# Patient Record
Sex: Male | Born: 1940 | Race: White | Hispanic: No | Smoking: Former smoker
Health system: Southern US, Community
[De-identification: ages and names within clinical notes are randomized; demographics above are authoritative.]

## PROBLEM LIST (undated history)

## (undated) DIAGNOSIS — E785 Hyperlipidemia, unspecified: Secondary | ICD-10-CM

## (undated) DIAGNOSIS — Z87442 Personal history of urinary calculi: Secondary | ICD-10-CM

## (undated) DIAGNOSIS — I495 Sick sinus syndrome: Secondary | ICD-10-CM

## (undated) DIAGNOSIS — R011 Cardiac murmur, unspecified: Secondary | ICD-10-CM

## (undated) DIAGNOSIS — E119 Type 2 diabetes mellitus without complications: Secondary | ICD-10-CM

## (undated) DIAGNOSIS — I10 Essential (primary) hypertension: Secondary | ICD-10-CM

## (undated) DIAGNOSIS — Z9289 Personal history of other medical treatment: Secondary | ICD-10-CM

## (undated) DIAGNOSIS — Z950 Presence of cardiac pacemaker: Secondary | ICD-10-CM

## (undated) HISTORY — PX: LAPAROSCOPIC CHOLECYSTECTOMY: SUR755

## (undated) HISTORY — PX: LITHOTRIPSY: SUR834

## (undated) HISTORY — PX: KNEE ARTHROSCOPY W/ MENISCECTOMY: SHX1879

## (undated) HISTORY — DX: Hyperlipidemia, unspecified: E78.5

## (undated) HISTORY — PX: APPENDECTOMY: SHX54

## (undated) HISTORY — PX: TONSILLECTOMY: SUR1361

## (undated) HISTORY — PX: CYSTOSCOPY W/ STONE MANIPULATION: SHX1427

## (undated) HISTORY — PX: CATARACT EXTRACTION W/ INTRAOCULAR LENS IMPLANT: SHX1309

---

## 1998-03-20 ENCOUNTER — Emergency Department (HOSPITAL_COMMUNITY): Admission: EM | Admit: 1998-03-20 | Discharge: 1998-03-20 | Payer: Self-pay | Admitting: Emergency Medicine

## 1998-03-20 ENCOUNTER — Encounter: Payer: Self-pay | Admitting: Emergency Medicine

## 1998-03-21 ENCOUNTER — Ambulatory Visit (HOSPITAL_COMMUNITY): Admission: RE | Admit: 1998-03-21 | Discharge: 1998-03-21 | Payer: Self-pay | Admitting: Urology

## 1998-03-21 ENCOUNTER — Encounter: Payer: Self-pay | Admitting: Urology

## 1998-04-11 ENCOUNTER — Ambulatory Visit (HOSPITAL_COMMUNITY): Admission: RE | Admit: 1998-04-11 | Discharge: 1998-04-11 | Payer: Self-pay | Admitting: Urology

## 1998-04-11 ENCOUNTER — Encounter: Payer: Self-pay | Admitting: Urology

## 1998-06-25 ENCOUNTER — Ambulatory Visit (HOSPITAL_COMMUNITY): Admission: RE | Admit: 1998-06-25 | Discharge: 1998-06-25 | Payer: Self-pay | Admitting: Urology

## 1998-06-25 ENCOUNTER — Encounter: Payer: Self-pay | Admitting: Urology

## 1998-06-27 ENCOUNTER — Ambulatory Visit (HOSPITAL_COMMUNITY): Admission: RE | Admit: 1998-06-27 | Discharge: 1998-06-27 | Payer: Self-pay | Admitting: Urology

## 1998-06-27 ENCOUNTER — Encounter: Payer: Self-pay | Admitting: Urology

## 1998-07-07 ENCOUNTER — Encounter: Payer: Self-pay | Admitting: Emergency Medicine

## 1998-07-07 ENCOUNTER — Encounter: Payer: Self-pay | Admitting: Urology

## 1998-07-08 ENCOUNTER — Inpatient Hospital Stay (HOSPITAL_COMMUNITY): Admission: EM | Admit: 1998-07-08 | Discharge: 1998-07-09 | Payer: Self-pay | Admitting: Emergency Medicine

## 2002-06-26 ENCOUNTER — Ambulatory Visit (HOSPITAL_COMMUNITY): Admission: RE | Admit: 2002-06-26 | Discharge: 2002-06-26 | Payer: Self-pay | Admitting: Gastroenterology

## 2004-03-28 ENCOUNTER — Encounter: Admission: RE | Admit: 2004-03-28 | Discharge: 2004-03-28 | Payer: Self-pay | Admitting: Internal Medicine

## 2009-12-02 ENCOUNTER — Encounter: Admission: RE | Admit: 2009-12-02 | Discharge: 2009-12-02 | Payer: Self-pay | Admitting: Gastroenterology

## 2009-12-16 ENCOUNTER — Encounter: Admission: RE | Admit: 2009-12-16 | Discharge: 2009-12-16 | Payer: Self-pay | Admitting: Gastroenterology

## 2010-02-01 ENCOUNTER — Other Ambulatory Visit: Payer: Self-pay | Admitting: Internal Medicine

## 2010-02-01 DIAGNOSIS — R911 Solitary pulmonary nodule: Secondary | ICD-10-CM

## 2010-02-12 ENCOUNTER — Encounter: Payer: Self-pay | Admitting: Gastroenterology

## 2010-05-30 NOTE — Op Note (Signed)
   NAME:  Travis [name], Travis [name] NO.:  [id number]   MEDICAL RECORD NO.:  [id number]                   PATIENT TYPE:  AMB   LOCATION:  ENDO                                 FACILITY:  [facility]   PHYSICIAN:  Danise Edge, M.D.                DATE OF BIRTH:  May 14, 1940   DATE OF PROCEDURE:  06/26/2002  DATE OF DISCHARGE:                                 OPERATIVE REPORT   PROCEDURE:  Colonoscopy.   INDICATIONS:  The patient is a 70 year old male born 02-26-1940.  The  patient was scheduled to undergo his first screening colonoscopy with  polypectomy to prevent colon cancer.  Due to colonic loop formation, which  could be felt but not controlled by external abdominal pressure, a  colonoscopy was performed only to the hepatic flexure.  I was unable to  examine the ascending colon, cecum or ileocecal valve.   ENDOSCOPIST:  Danise Edge, M.D.   PREMEDICATION:  Versed 7 mg, Demerol 50 mg.   DESCRIPTION OF PROCEDURE:  After obtaining informed consent, the patient was  placed in the left lateral decubitus position.  I administered intravenous  Demerol and intravenous Versed to achieve conscious sedation for the  procedure.  The patient's blood pressure, oxygen saturation and cardiac  rhythm were monitored throughout the procedure and documented in the medical  record.   Anal inspection was normal.  Digital rectal examination revealed a  nonnodular prostate.  I started with the Olympus adult colonoscope, but  switched to the adjustable pediatric colonoscope.  Colonic preparation for  the examination today was excellent.   RECTUM:  Normal.  SIGMOID COLON:  Normal.  DESCENDING COLON:  Normal.  SPLENIC FLEXURE:  Normal.  TRANSVERSE COLON:  Normal.  HEPATIC FLEXURE:  Normal.  CECUM:  Not examined.  ILEOCECAL VALVE:  Not examined.  ASCENDING COLON:  Not examined.   ASSESSMENT:  Normal screening proctocolonoscopy to the hepatic flexure.  The  complete  colonoscopy was not performed.   RECOMMENDATIONS:  I would recommend the patient undergo a virtual  colonoscopy when it becomes commercially available.                                               Danise Edge, M.D.   MJ/MEDQ  D:  06/26/2002  T:  06/26/2002  Job:  [id number]

## 2010-12-29 ENCOUNTER — Other Ambulatory Visit: Payer: Self-pay

## 2013-03-14 ENCOUNTER — Encounter: Payer: Self-pay | Admitting: Podiatry

## 2013-03-14 ENCOUNTER — Ambulatory Visit (INDEPENDENT_AMBULATORY_CARE_PROVIDER_SITE_OTHER): Payer: Medicare Other | Admitting: Podiatry

## 2013-03-14 ENCOUNTER — Ambulatory Visit (INDEPENDENT_AMBULATORY_CARE_PROVIDER_SITE_OTHER): Payer: Medicare Other

## 2013-03-14 VITALS — BP 155/70 | HR 60 | Resp 12

## 2013-03-14 DIAGNOSIS — R52 Pain, unspecified: Secondary | ICD-10-CM

## 2013-03-14 DIAGNOSIS — Q828 Other specified congenital malformations of skin: Secondary | ICD-10-CM

## 2013-03-14 DIAGNOSIS — M79609 Pain in unspecified limb: Secondary | ICD-10-CM

## 2013-03-14 DIAGNOSIS — B351 Tinea unguium: Secondary | ICD-10-CM

## 2013-03-14 DIAGNOSIS — M7750 Other enthesopathy of unspecified foot: Secondary | ICD-10-CM

## 2013-03-14 NOTE — Progress Notes (Signed)
   Subjective:    Patient ID: Travis [name], male    DOB: 17-Jun-1940, 73 y.o.   MRN: [id number]  HPI '' BOTH BALL OF THE FEET HAVE CALLUSES AND THEY BEEN HURTING FOR 10 YEARS. THE FEET GET AGGRAVATED BY WALKING AND PUTTING PRESSURE AND GRADUALLY GETTING WORSE. TRIED TO KEEP THEM TRIM DOWN AND IT HELP.''    Review of Systems  HENT: Positive for hearing loss.        Objective:   Physical Exam: I have reviewed his past history medications allergies surgeries social history. Pulses are palpable bilateral neurologic sensorium is intact per Semmes-Weinstein monofilament. Deep tendon reflexes are brisk and equal bilateral. Muscle strength is 5 over 5 dorsiflexors plantar flexors inverters everters all intrinsic musculature is intact orthopedic evaluation demonstrates mild flexible hammertoe deformities are noted he has fat-pad atrophy with reactive hyperkeratotic lesions some first second third and fourth and fifth metatarsals of the bilateral foot. His nails are thick yellow dystrophic onychomycotic painful palpation.  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

## 2013-10-30 ENCOUNTER — Encounter: Payer: Self-pay | Admitting: Family Medicine

## 2013-10-30 ENCOUNTER — Ambulatory Visit (INDEPENDENT_AMBULATORY_CARE_PROVIDER_SITE_OTHER): Payer: Medicare Other | Admitting: Family Medicine

## 2013-10-30 VITALS — BP 130/80 | HR 45 | Ht 70.0 in | Wt 162.0 lb

## 2013-10-30 DIAGNOSIS — G5702 Lesion of sciatic nerve, left lower limb: Secondary | ICD-10-CM

## 2013-10-30 NOTE — Progress Notes (Signed)
  Tawana ScaleZach Mikles D.O. [facility] [address] [place], [place] [address] Phone: [phone] Subjective:      CC: Left hip pain  EXB:[id number]:Subjective Travis MingsJames F [name] is a 73 y.o. male coming in with complaint of left hip pain. Patient is an avid runner averaging approximately 35-40 miles a week. Patient was running a half marathon and noticed some discomfort on the lateral aspect of his hip. Patient states it hurts on the lateral as well as the posterior aspect into his gluteus. This is been intermittent for approximately 1 year but seems to be increasing in frequency as well as and pain. Patient has not really tried any all modalities at this time. Patient states that it seems to be only sore when he does activity or after sitting for long amount of time. Patient states it is not affecting his daily activities and he continues to run even though he has some discomfort. Patient denies any groin pain or any back pain that is associated with a. Denies any weakness but states that the pain can radiate down his leg mostly on the posterior aspect from time to time. Denies any nighttime awakening. At the severity of 5/10.     Past medical history, social, surgical and family history all reviewed in electronic medical record.   Review of Systems: No headache, visual changes, nausea, vomiting, diarrhea, constipation, dizziness, abdominal pain, skin rash, fevers, chills, night sweats, weight loss, swollen lymph nodes, body aches, joint swelling, muscle aches, chest pain, shortness of breath, mood changes.   Objective Blood pressure 130/80, pulse 45, height 5\' 10"  (1.778 m), weight 162 lb (73.483 kg), SpO2 99.00%.  General: No apparent distress alert and oriented x3 mood and affect normal, dressed appropriately.  HEENT: Pupils equal, extraocular movements intact  Respiratory: Patient's speak in full sentences and does not appear short of breath  Cardiovascular: No lower extremity edema, non tender, no  erythema  Skin: Warm dry intact with no signs of infection or rash on extremities or on axial skeleton.  Abdomen: Soft nontender  Neuro: Cranial nerves II through XII are intact, neurovascularly intact in all extremities with 2+ DTRs and 2+ pulses.  Lymph: No lymphadenopathy of posterior or anterior cervical chain or axillae bilaterally.  Gait normal with good balance and coordination.  MSK:  Non tender with full range of motion and good stability and symmetric strength and tone of shoulders, elbows, wrist, knee and ankles bilaterally.  Hip: left ROM IR: 20 Deg, ER: 35 Deg, Flexion: 100 Deg, Extension: 100 Deg, Abduction: 35 Deg, Adduction: 35 Deg Strength IR: 4/5, ER: 5/5, Flexion: 5/5, Extension: 5/5, Abduction: 4/5, Adduction: 5/5 Pelvic alignment unremarkable to inspection and palpation. Standing hip rotation and gait without trendelenburg sign / unsteadiness. Greater trochanter without tenderness to palpation.  tenderness over piriformis and greater trochanter. Positiveh FABER negative FADIR. No SI joint tenderness and normal minimal SI movement. Contralateral hip unremarkable   Impression and Recommendations:     This case required medical decision making of moderate complexity.

## 2013-10-30 NOTE — Assessment & Plan Note (Signed)
Piriformis Syndrome  Using an anatomical model, reviewed with the patient the structures involved and how they related to diagnosis. The patient indicated understanding.   The patient was given a handout from Dr. Ailene Ardsouzier's book "The Sports Medicine Patient Advisor" describing the anatomy and rehabilitation of the following condition: Piriformis Syndrome  Also given a handout with more extensive Piriformis stretching, hip flexor and abductor strengthening, ham stretching  Rec deep massage, explained self-massage with ball  Discussed icing after activity as well as well as proper shoe choices. Patient will come back and see me again in 3-4 weeks to make sure he continues to improve. Differential would include hip arthritis the patient has good range of motion compared to the contralateral side with no groin pain. He is continuing to give him discomfort we will get x-rays of the hip as well as the back.

## 2013-10-30 NOTE — Patient Instructions (Signed)
Good to meet you Piriformis syndrome.  Ice 20 minutes 2 times daily. Usually after activity and before bed. Exercises mostly after running.  Consider only 1 race on Halloween.  Vitamin D 2000 IU daily.  Eat within 30 minutes. 4:1 ratio carbs to protein  (example chocolate milk) Whey protein isolate. 1 scoop after activity and maybe before bed.  Come see me again in 3-4 weeks.

## 2013-11-22 ENCOUNTER — Ambulatory Visit: Payer: Medicare Other | Admitting: Family Medicine

## 2013-11-30 ENCOUNTER — Ambulatory Visit: Payer: Medicare Other | Admitting: Family Medicine

## 2013-12-04 ENCOUNTER — Ambulatory Visit (INDEPENDENT_AMBULATORY_CARE_PROVIDER_SITE_OTHER): Payer: Medicare Other | Admitting: Family Medicine

## 2013-12-04 ENCOUNTER — Encounter: Payer: Self-pay | Admitting: Family Medicine

## 2013-12-04 VITALS — BP 134/72 | HR 59 | Ht 70.0 in | Wt 161.0 lb

## 2013-12-04 DIAGNOSIS — G5702 Lesion of sciatic nerve, left lower limb: Secondary | ICD-10-CM

## 2013-12-04 NOTE — Patient Instructions (Addendum)
It is good to see you.  You are doing great Continue with the exercises focusing on the hip abductor strengthening.  Tennis ball in back right pocket with sitting Exercises on wall.  Heel and butt touching.  Raise leg 6 inches and hold 2 seconds.  Down slow for count of 4 seconds.  1 set of 30 reps daily on both sides.  Continue the exercises 3 times a week.  Make an appointment in 6 weeks.

## 2013-12-04 NOTE — Assessment & Plan Note (Signed)
Patient is doing remarkably well with conservative therapy. We discussed continuing the manual massage, over-the-counter natural vitamins. We also discussed exercises and was given phase II strengthening exercises today. We discussed icing protocol. Patient will continue to run as tolerated. Patient will come back again in 6 weeks for further evaluation.  Spent greater than 25 minutes with patient face-to-face and had greater than 50% of counseling including as described above in assessment and plan.

## 2013-12-04 NOTE — Progress Notes (Signed)
  Tawana ScaleZach Timberman D.O. [facility] [address] [place], [place] [address] Phone: [phone] Subjective:      CC: Left hip pain follow up GEX:BMWUXLKGMWHPI:Subjective Travis MingsJames F Koopman is a 73 y.o. male coming in with complaint of left hip pain. Patient is an avid runner (35-40 miles a week), seen previously one month ago and was diagnosed with a piriformis syndrome. Patient was given home exercises, manual massage, and we discussed over-the-counter medications a complete beneficial. Patient was also given information about post exercises nutrition. Patient states he is approximately 85% better. Patient has run multiple races recently and he has not had any discomfort whatsoever during running. Patient states some mild dull aching pain from time to time but nothing out of the ordinary. Patient denies any pain down the leg any numbness or tingling or any weakness. Able to do all activities daily living without any significant pain.     Past medical history, social, surgical and family history all reviewed in electronic medical record.   Review of Systems: No headache, visual changes, nausea, vomiting, diarrhea, constipation, dizziness, abdominal pain, skin rash, fevers, chills, night sweats, weight loss, swollen lymph nodes, body aches, joint swelling, muscle aches, chest pain, shortness of breath, mood changes.   Objective Blood pressure 134/72, pulse 59, height 5\' 10"  (1.778 m), weight 161 lb (73.029 kg), SpO2 99 %.  General: No apparent distress alert and oriented x3 mood and affect normal, dressed appropriately.  HEENT: Pupils equal, extraocular movements intact  Respiratory: Patient's speak in full sentences and does not appear short of breath  Cardiovascular: No lower extremity edema, non tender, no erythema  Skin: Warm dry intact with no signs of infection or rash on extremities or on axial skeleton.  Abdomen: Soft nontender  Neuro: Cranial nerves II through XII are intact, neurovascularly  intact in all extremities with 2+ DTRs and 2+ pulses.  Lymph: No lymphadenopathy of posterior or anterior cervical chain or axillae bilaterally.  Gait normal with good balance and coordination.  MSK:  Non tender with full range of motion and good stability and symmetric strength and tone of shoulders, elbows, wrist, knee and ankles bilaterally.  Hip: left ROM IR: 20 Deg, ER: 35 Deg, Flexion: 100 Deg, Extension: 100 Deg, Abduction: 35 Deg, Adduction: 35 Deg Strength IR: 4/5, ER: 5/5, Flexion: 5/5, Extension: 5/5, Abduction: 4/5, Adduction: 5/5 Pelvic alignment unremarkable to inspection and palpation. Standing hip rotation and gait without trendelenburg sign / unsteadiness. Greater trochanter without tenderness to palpation.  tenderness over piriformis and greater trochanter.  Mild +FABER but improved. negative FADIR. No SI joint tenderness and normal minimal SI movement. Contralateral hip unremarkable   Impression and Recommendations:     This case required medical decision making of moderate complexity.

## 2014-01-15 ENCOUNTER — Ambulatory Visit: Payer: Medicare Other | Admitting: Family Medicine

## 2014-03-20 ENCOUNTER — Ambulatory Visit (INDEPENDENT_AMBULATORY_CARE_PROVIDER_SITE_OTHER): Payer: Medicare Other | Admitting: Podiatry

## 2014-03-20 ENCOUNTER — Encounter: Payer: Self-pay | Admitting: Podiatry

## 2014-03-20 VITALS — BP 135/72 | HR 42

## 2014-03-20 DIAGNOSIS — M79673 Pain in unspecified foot: Secondary | ICD-10-CM

## 2014-03-20 DIAGNOSIS — M779 Enthesopathy, unspecified: Secondary | ICD-10-CM

## 2014-03-20 DIAGNOSIS — B351 Tinea unguium: Secondary | ICD-10-CM

## 2014-03-20 DIAGNOSIS — M7750 Other enthesopathy of unspecified foot: Secondary | ICD-10-CM

## 2014-03-20 DIAGNOSIS — M778 Other enthesopathies, not elsewhere classified: Secondary | ICD-10-CM

## 2014-03-20 NOTE — Progress Notes (Signed)
He presents today with a chief complaint of painful lesions beneath the first and fifth metatarsals on the right foot and the first and third metatarsals on the left foot. He is also complaining of painfully elongated toenails.  Objective: Vital signs are stable he is alert and oriented 3. Pulses are strongly palpable bilateral. Recommended hyperkeratosis plantar aspect of the bilateral foot debridement today his nails are also thick yellow dystrophic with mycotic and painful palpation.  Assessment: Capsulitis with plantar flexed elongated first and fifth metatarsals right foot first and third metatarsals left foot. Pain in limb with onychomycosis bilateral.  Plan: Debrided reactive hyperkeratosis today. Debrided all nails 1 through 5 bilateral today. And he was scanned for set of orthotics.

## 2014-04-10 ENCOUNTER — Ambulatory Visit: Payer: Medicare Other | Admitting: *Deleted

## 2014-04-10 DIAGNOSIS — M779 Enthesopathy, unspecified: Principal | ICD-10-CM

## 2014-04-10 DIAGNOSIS — M778 Other enthesopathies, not elsewhere classified: Secondary | ICD-10-CM

## 2014-04-10 NOTE — Progress Notes (Signed)
Patient ID: Travis MingsJames F Piggee, male   DOB: [birth year], 74 y.o.   MRN: [id number] PICKING UP INSERTS

## 2014-04-10 NOTE — Patient Instructions (Signed)

## 2014-06-29 ENCOUNTER — Encounter: Payer: Self-pay | Admitting: Family Medicine

## 2014-06-29 ENCOUNTER — Telehealth: Payer: Self-pay | Admitting: Family Medicine

## 2014-06-29 ENCOUNTER — Ambulatory Visit (INDEPENDENT_AMBULATORY_CARE_PROVIDER_SITE_OTHER): Payer: Medicare Other | Admitting: Family Medicine

## 2014-06-29 VITALS — BP 134/72 | HR 59 | Ht 70.0 in | Wt 161.0 lb

## 2014-06-29 DIAGNOSIS — R1030 Lower abdominal pain, unspecified: Secondary | ICD-10-CM

## 2014-06-29 NOTE — Patient Instructions (Signed)
Good to see you.  Ice 20 minutes 2 times daily. Usually after activity and before bed. Exercises 3 times a week.  OK to bike but no running Duexis 3 times a day for 3 week.  Try to run in 1 week and if pai n no go on the 10K See me again in 3 weeks.

## 2014-06-29 NOTE — Assessment & Plan Note (Signed)
Patient does not have any signs of herniation patient does not have any significant weakness of the musculature. Patient has full strength the hip flexor as well as adductor. 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 Patient was only minimally tender and seems to be improving. Discussed with patient about the possibility of x-rays which patient declined. I do not think that this would change management with him improving. We did discussed vitamin D supplementation, home exercises, and icing. Patient given handout today. Patient will try to make these changes and come back in 2 weeks. Patient does not make any significant improvement I would consider further imaging. We will start with an ultrasound and consider x-ray. No history of previous insufficiency fracture.  Spent  25 minutes with patient face-to-face and had greater than 50% of counseling including as described above in assessment and plan.

## 2014-06-29 NOTE — Telephone Encounter (Signed)
Spoke with pt. We are going to see him today at 1030.

## 2014-06-29 NOTE — Progress Notes (Signed)
Pre visit review using our clinic review tool, if applicable. No additional management support is needed unless otherwise documented below in the visit note. 

## 2014-06-29 NOTE — Telephone Encounter (Signed)
Patient states he hurt his right leg - mostly - by possibly lifting something too heavy last week.  Patient states the pain has moved up to his groin area.  Patient is requesting to be seen today.

## 2014-06-29 NOTE — Progress Notes (Signed)
  [facility] [address] [place], [place] [address] Phone: [phone] Subjective:     CC: Bilateral groin pain  EVO:[id number] Travis [name] is a 74 y.o. male coming in with complaint of bilateral groin pain. Patient states that approximately 3 weeks ago after running started having significant pain down the thighs bilaterally. Patient denies any back pain that was associated with it. Patient states that the pain going down the legs has improved but continues to have a dull aching throbbing sensation in the groins bilaterally. Patient states it hurts more when he tries to do any running. Patient states that it might be improving slowly over the course of time. Swelling, any bruising when the event occurred. Denies anything other than he did do some more lifting of boxes which is different than usual. Patient denies any bowel or bladder problems. Rates the severity of pain a 4 out of 10 but is concerned because he cannot run like he usually does.     Past medical history, social, surgical and family history all reviewed in electronic medical record.   Review of Systems: No headache, visual changes, nausea, vomiting, diarrhea, constipation, dizziness, abdominal pain, skin rash, fevers, chills, night sweats, weight loss, swollen lymph nodes, body aches, joint swelling, muscle aches, chest pain, shortness of breath, mood changes.   Objective Blood pressure 134/72, pulse 59, height 5\' 10"  (1.778 m), weight 161 lb (73.029 kg), SpO2 99 %.  General: No apparent distress alert and oriented x3 mood and affect normal, dressed appropriately.  HEENT: Pupils equal, extraocular movements intact  Respiratory: Patient's speak in full sentences and does not appear short of breath  Cardiovascular: No lower extremity edema, non tender, no erythema  Skin: Warm dry intact with no signs of infection or rash on extremities or on axial skeleton.  Abdomen: Soft nontender  Neuro:  Cranial nerves II through XII are intact, neurovascularly intact in all extremities with 2+ DTRs and 2+ pulses.  Lymph: No lymphadenopathy of posterior or anterior cervical chain or axillae bilaterally.  Gait normal with good balance and coordination.  MSK:  Non tender with full range of motion and good stability and symmetric strength and tone of shoulders, elbows, wrist,  knee and ankles bilaterally.  Hip: Bilateral ROM IR: 25 Deg, ER: 45 Deg, Flexion: 120 Deg, Extension: 100 Deg, Abduction: 45 Deg, Adduction: 45 Deg Strength IR: 5/5, ER: 5/5, Flexion: 5/5, Extension: 5/5, Abduction: 5/5, Adduction: 5/5 Pelvic alignment unremarkable to inspection and palpation. Standing hip rotation and gait without trendelenburg sign / unsteadiness. Greater trochanter without tenderness to palpation. No tenderness over piriformis and greater trochanter. No pain with FABER or FADIR. No SI joint tenderness and normal minimal SI movement. Patient's only pain is directly over the pubic bone itself.   no bulges or hernias palpated today. Impression and Recommendations:     This case required medical decision making of moderate complexity.

## 2014-07-20 ENCOUNTER — Encounter: Payer: Self-pay | Admitting: Family Medicine

## 2014-07-20 ENCOUNTER — Ambulatory Visit (INDEPENDENT_AMBULATORY_CARE_PROVIDER_SITE_OTHER): Payer: Medicare Other | Admitting: Family Medicine

## 2014-07-20 VITALS — BP 124/82 | HR 52 | Ht 70.0 in | Wt 167.0 lb

## 2014-07-20 DIAGNOSIS — R1030 Lower abdominal pain, unspecified: Secondary | ICD-10-CM | POA: Diagnosis not present

## 2014-07-20 NOTE — Assessment & Plan Note (Signed)
doing markedly well. I do think that this was more likely an adductor strain. Seems to be nearly completely resolved. I do think that at the osteitis pubis is also within the differential but patient is doing well. Discussed the possibility of getting pictures to further evaluate as well as to make sure that there is no underlying arthritis that could be contribute in. Patient declined this today. Patient follow-up with me again on an as-needed basis.

## 2014-07-20 NOTE — Progress Notes (Signed)
Pre visit review using our clinic review tool, if applicable. No additional management support is needed unless otherwise documented below in the visit note. 

## 2014-07-20 NOTE — Progress Notes (Signed)
  Tawana ScaleZach Shuler D.O. [facility] [address] [place], [place] [address] Phone: [phone] Subjective:     CC: Bilateral groin pain follow up  OZH:[id number]:Subjective Travis [name] is a 74 y.o. male coming in with complaint of bilateral groin pain. Patient states that approximately 3 weeks ago after running started having significant pain down the thighs bilaterally. Patient says that the pain has completely resolved at this time. Would state that there is a awareness on the right side. Patient states that he has been running. Patient did attend [facility] as well as a 5K back-to-back and did not have any significant discomfort. Patient is still running approximately 3 times a week. Continues with the vitamin D supplementation. Patient does do the icing as well as running with compression. Patient denies any nighttime pain.     Past medical history, social, surgical and family history all reviewed in electronic medical record.   Review of Systems: No headache, visual changes, nausea, vomiting, diarrhea, constipation, dizziness, abdominal pain, skin rash, fevers, chills, night sweats, weight loss, swollen lymph nodes, body aches, joint swelling, muscle aches, chest pain, shortness of breath, mood changes.   Objective Blood pressure 124/82, pulse 52, height 5\' 10"  (1.778 m), weight 167 lb (75.751 kg), SpO2 98 %.  General: No apparent distress alert and oriented x3 mood and affect normal, dressed appropriately.  HEENT: Pupils equal, extraocular movements intact  Respiratory: Patient's speak in full sentences and does not appear short of breath  Cardiovascular: No lower extremity edema, non tender, no erythema  Skin: Warm dry intact with no signs of infection or rash on extremities or on axial skeleton.  Abdomen: Soft nontender  Neuro: Cranial nerves II through XII are intact, neurovascularly intact in all extremities with 2+ DTRs and 2+ pulses.  Lymph: No lymphadenopathy of posterior or anterior  cervical chain or axillae bilaterally.  Gait normal with good balance and coordination.  MSK:  Non tender with full range of motion and good stability and symmetric strength and tone of shoulders, elbows, wrist,  knee and ankles bilaterally.  Hip: Bilateral ROM IR: 25 Deg, ER: 45 Deg, Flexion: 120 Deg, Extension: 100 Deg, Abduction: 45 Deg, Adduction: 45 Deg Strength IR: 5/5, ER: 5/5, Flexion: 5/5, Extension: 5/5, Abduction: 4/5, Adduction: 5/5 Pelvic alignment unremarkable to inspection and palpation. Standing hip rotation and gait without trendelenburg sign / unsteadiness. Greater trochanter without tenderness to palpation. No tenderness over piriformis and greater trochanter. No pain with FABER or FADIR. No SI joint tenderness and normal minimal SI movement. No pain over the pubic symphysis   no hernia present Impression and Recommendations:     This case required medical decision making of moderate complexity.

## 2014-07-20 NOTE — Patient Instructions (Signed)
Good to see you Ice after activity Continue the compression on the right thigh Make sure do stretches 2 times a week at least forever Win some medals See me when you need me.

## 2015-03-07 ENCOUNTER — Ambulatory Visit (INDEPENDENT_AMBULATORY_CARE_PROVIDER_SITE_OTHER): Payer: Medicare Other | Admitting: Podiatry

## 2015-03-07 ENCOUNTER — Encounter: Payer: Self-pay | Admitting: Podiatry

## 2015-03-07 DIAGNOSIS — B351 Tinea unguium: Secondary | ICD-10-CM

## 2015-03-07 DIAGNOSIS — Q828 Other specified congenital malformations of skin: Secondary | ICD-10-CM

## 2015-03-07 DIAGNOSIS — M79676 Pain in unspecified toe(s): Secondary | ICD-10-CM

## 2015-03-07 NOTE — Progress Notes (Signed)
He presents today for follow-up of his porokeratosis. I have not seen him in quite some time. He states that he continues to run several miles, approximately 30 miles a week and he continues to use his orthotics. He states that his feet seem to be doing better with the orthotics.  Objective: Vital signs are stable he is alert and oriented 3 pulses are palpable bilateral. Neurologic sensorium is intact. Reactive porokeratotic lesions plantar aspect of the bilateral foot.  Assessment: Porokeratosis of fifth and second bilateral.  Plan: Debridement of all reactive hyperkeratosis for him today. Follow up with him as needed. Recommended continue use of the orthotics.

## 2015-10-03 ENCOUNTER — Ambulatory Visit: Payer: Medicare Other | Admitting: Podiatry

## 2015-10-17 ENCOUNTER — Encounter: Payer: Self-pay | Admitting: Podiatry

## 2015-10-17 ENCOUNTER — Ambulatory Visit (INDEPENDENT_AMBULATORY_CARE_PROVIDER_SITE_OTHER): Payer: Medicare Other | Admitting: Podiatry

## 2015-10-17 DIAGNOSIS — Q828 Other specified congenital malformations of skin: Secondary | ICD-10-CM

## 2015-10-17 DIAGNOSIS — M79676 Pain in unspecified toe(s): Secondary | ICD-10-CM

## 2015-10-17 DIAGNOSIS — B351 Tinea unguium: Secondary | ICD-10-CM

## 2015-10-17 NOTE — Progress Notes (Signed)
He presents today with chief complaint of painful elongated toenails and calluses bilateral.  Objective: Vital signs are stable he is alert and oriented 3. Pulses are strongly palpable. Neurologic system is intact. His toenails are thick yellow dystrophic onychomycotic and painful palpation as well as debridement. Multiple areas of porokeratosis plantar aspect of the right foot and left foot. No open lesions or wounds are noted.  Assessment: Pain limb secondary onychomycosis porokeratosis bilateral.  Plan: Debridement of all reactive hyperkeratoses and debridement of toenails 1 through 5 bilateral. Follow up with him as needed

## 2016-02-20 ENCOUNTER — Ambulatory Visit (INDEPENDENT_AMBULATORY_CARE_PROVIDER_SITE_OTHER): Payer: Medicare Other | Admitting: Podiatry

## 2016-02-20 ENCOUNTER — Encounter: Payer: Self-pay | Admitting: Podiatry

## 2016-02-20 DIAGNOSIS — M79676 Pain in unspecified toe(s): Secondary | ICD-10-CM | POA: Diagnosis not present

## 2016-02-20 DIAGNOSIS — Q828 Other specified congenital malformations of skin: Secondary | ICD-10-CM | POA: Diagnosis not present

## 2016-02-20 DIAGNOSIS — B351 Tinea unguium: Secondary | ICD-10-CM | POA: Diagnosis not present

## 2016-02-23 NOTE — Progress Notes (Signed)
He presents today chief complaint painful elongated toenails and multiple porokeratosis plantar aspect of bilateral foot.  Objective: Pulses are minimally palpable no open lesions or wounds. 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 The active hyperkeratosis present.  Assessment: Porokeratosis with peripheral vascular disease and pain in limb sooner onychomycosis.  Plan: Debridement of already tried hyperkeratosis and toenails bilateral. Follow up within 3 months.

## 2016-05-20 ENCOUNTER — Ambulatory Visit (INDEPENDENT_AMBULATORY_CARE_PROVIDER_SITE_OTHER): Payer: Medicare Other | Admitting: Cardiovascular Disease

## 2016-05-20 ENCOUNTER — Encounter: Payer: Self-pay | Admitting: Cardiovascular Disease

## 2016-05-20 VITALS — BP 159/71 | HR 37 | Ht 70.0 in | Wt 165.0 lb

## 2016-05-20 DIAGNOSIS — I10 Essential (primary) hypertension: Secondary | ICD-10-CM | POA: Diagnosis not present

## 2016-05-20 DIAGNOSIS — R001 Bradycardia, unspecified: Secondary | ICD-10-CM | POA: Diagnosis not present

## 2016-05-20 DIAGNOSIS — R55 Syncope and collapse: Secondary | ICD-10-CM | POA: Diagnosis not present

## 2016-05-20 DIAGNOSIS — I4589 Other specified conduction disorders: Secondary | ICD-10-CM | POA: Diagnosis not present

## 2016-05-20 DIAGNOSIS — E119 Type 2 diabetes mellitus without complications: Secondary | ICD-10-CM

## 2016-05-20 DIAGNOSIS — I358 Other nonrheumatic aortic valve disorders: Secondary | ICD-10-CM

## 2016-05-20 DIAGNOSIS — E781 Pure hyperglyceridemia: Secondary | ICD-10-CM | POA: Diagnosis not present

## 2016-05-20 NOTE — Progress Notes (Signed)
Cardiology Office Note    Date:  05/22/2016   ID:  FED CECI, DOB 05-Dec-1940, MRN [id number]  PCP:  Deretha Emory, MD  Referring M.D.: Dr. Lorenda Ishihara  Cardiologist:  Nicki Guadalajara, MD   Chief Complaint  Patient presents with  . New Patient (Initial Visit)    no chest pain  . Dizziness    when running only.    History of Present Illness:  Travis [name] is a 76 y.o. male who is referred through the courtesy of Dr.Varadarajan for evaluation of lightheadedness and dizziness while running.  Travis [name] has been active for his entire life.  He has a history of hypertension, hyperlipidemia, and has been diagnosed with type 2 diabetes mellitus for 14 years.  He exercises at least 6 days per week for 1-2 hours.  He does resistance training, spin classes, and typically runs anywhere from 5-6 miles at a 9-10 minute mile pace and has been doing so for many years.  His pulse typically runs in the low 30s.  Recently, he has begun to notice that he becomes dizzy when he starts to run.  This is associated with mild shortness of breath.  He denies any chest pain.  The symptoms have occurred since January.  He denies any frank syncope.  He denies the lightheaded symptoms when he is at rest and his symptoms only occur with significant activity.  When he does the spin class.  He does not notice any of the symptoms.  He is now referred for cardiology evaluation.   Past Medical History:  Diagnosis Date  . Hyperlipidemia     No past surgical history on file.  Current Medications: Outpatient Medications Prior to Visit  Medication Sig Dispense Refill  . aspirin EC 81 MG tablet Take 81 mg by mouth daily.    Marland Kitchen atorvastatin (LIPITOR) 10 MG tablet Take 10 mg by mouth daily.    Marland Kitchen lisinopril-hydrochlorothiazide (PRINZIDE,ZESTORETIC) 10-12.5 MG per tablet Take 1 tablet by mouth daily.    . metFORMIN (GLUCOPHAGE) 500 MG tablet Take by mouth 2 (two) times daily with a meal.    . sildenafil  (VIAGRA) 100 MG tablet Take 100 mg by mouth daily as needed for erectile dysfunction.    . Brimonidine Tartrate-Timolol (COMBIGAN OP) Apply to eye.     No facility-administered medications prior to visit.      Allergies:   Patient has no known allergies.   Social History   Social History  . Marital status: Single    Spouse name: N/A  . Number of children: N/A  . Years of education: N/A   Social History Main Topics  . Smoking status: Never Smoker  . Smokeless tobacco: Never Used  . Alcohol use Yes  . Drug use: No  . Sexual activity: Not Asked   Other Topics Concern  . None   Social History Narrative  . None    Social history is notable in that he was born in [place].  He's been married for 54 years.  He has 2 children, 7 grandchildren, and one great-grandchild.  He has a Event organiser in Engineer, technical sales.  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  He drinks occasional beer.  He does not smoke.   Family History:  The patient's family history includes Colon cancer in his father..  He has a brother age 39 and a sister age 68, who are healthy.  ROS General: Negative; No fevers, chills, or night sweats;  HEENT: Negative; No changes in vision or hearing, sinus congestion, difficulty swallowing Pulmonary: Negative; No cough, wheezing, shortness of breath, hemoptysis Cardiovascular:  See HPI GI: Negative; No nausea, vomiting, diarrhea, or abdominal pain GU: Negative; No dysuria, hematuria, or difficulty voiding Musculoskeletal: Negative; no myalgias, joint pain, or weakness Hematologic/Oncology: Negative; no easy bruising, bleeding Endocrine: Negative; no heat/cold intolerance; no diabetes Neuro: Negative; no changes in balance, headaches Skin: Negative; No rashes or skin lesions Psychiatric: Negative; No behavioral problems, depression Sleep: Negative; No snoring, daytime sleepiness, hypersomnolence, bruxism, restless legs,  hypnogognic hallucinations, no cataplexy Other comprehensive 14 point system review is negative.   PHYSICAL EXAM:   VS:  BP (!) 159/71   Pulse (!) 37   Ht 5\' 10"  (1.778 m)   Wt 165 lb (74.8 kg)   BMI 23.68 kg/m    Wt Readings from Last 3 Encounters:  05/20/16 165 lb (74.8 kg)  07/20/14 167 lb (75.8 kg)  06/29/14 161 lb (73 kg)    General: Alert, oriented, no distress.  Skin: normal turgor, no rashes, warm and dry HEENT: Normocephalic, atraumatic. Pupils equal round and reactive to light; sclera anicteric; extraocular muscles intact; Fundi Normal Nose without nasal septal hypertrophy Mouth/Parynx benign; Mallinpatti scale 2 Neck: No JVD, no carotid bruits; normal carotid upstroke Lungs: clear to ausculatation and percussion; no wheezing or rales Chest wall: without tenderness to palpitation Heart: PMI not displaced, bradycardic with heart rate in the upper 30s-40s, s1 s2 normal, 2/6 systolic murmur in the aortic area, no diastolic murmur, no rubs, gallops, thrills, or heaves Abdomen: soft, nontender; no hepatosplenomehaly, BS+; abdominal aorta nontender and not dilated by palpation. Back: no CVA tenderness Pulses 2+ Musculoskeletal: full range of motion, normal strength, no joint deformities Extremities: no clubbing cyanosis or edema, Homan's sign negative  Neurologic: grossly nonfocal; Cranial nerves grossly wnl Psychologic: Normal mood and affect   Studies/Labs Reviewed:   EKG:  EKG is ordered today.  ECG (independently read by me): Marked sinus bradycardia with sinus arrhythmia, ventricular rate in the 40s to upper 30s, average 37.  PR interval 190 ms.  QTc interval 335 ms.  No ST segment changes.  Recent Labs: No flowsheet data found.   No flowsheet data found.  No flowsheet data found. No results found for: MCV No results found for: TSH No results found for: HGBA1C   BNP No results found for: BNP  ProBNP No results found for: PROBNP   Lipid Panel  No  results found for: CHOL, TRIG, HDL, CHOLHDL, VLDL, LDLCALC, LDLDIRECT   RADIOLOGY: No results found.   Additional studies/ records that were reviewed today include:  I reviewed the recent records from [facility].    ASSESSMENT:    1. Bradycardia   2. Pre-syncope   3. Chronotropic incompetence   4. Aortic systolic murmur on examination   5. Essential hypertension   6. Pure hyperglyceridemia   7. Type 2 diabetes mellitus without complication, without long-term current use of insulin [employer](HCC)      PLAN:  Travis [name] is a very pleasant, active and healthy-appearing 76 year old gentleman who has a long-standing history of significant exercise and currently exercises for at least 1-2 hours per day 6 days per week.  He often runs 5-6 miles at a time and does spin class.  Typically, as result of his aerobic conditioning, he has had long-standing history of sinus bradycardia.  Recently, he has begun to notice dizziness when he  is running.  He denies the symptoms when he is doing spin class or resistance training.  His ECG today shows marked sinus bradycardia with sinus arrhythmia.  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  He initiates running.  He is unable to increase his heart rate significantly to meet increased demand of the exercise leading to transient hypotension.  On exam today in the office.  He has normal blood pressure and is not orthostatic.  He does have a 2/6 systolic murmur which is suggestive of possible aortic valve disease/aortic stenosis.  I'm scheduling him for an echo Doppler study for further evaluation.  I will also schedule him for an exercise Myoview study to assess his chronotropic competence with exercise as well as myocardial perfusion to make certain his symptoms do not ischemic mediated.  I am also scheduling him to wear an event monitor for at least 2 weeks.  He is on atorvastatin for hyperlipidemia.   He has been on lisinopril HCT 10/12.5 for  hypertension.  He currently is on metformin 500 mg twice a day for his type 2 diabetes mellitus.  I reviewed recent laboratory from 05/06/2016.  Cholesterol was 155, triglycerides 103, LDL 77, and HDL 57.  Hemoglobin A1c was mildly increased at 7.2.  Renal function was normal.  TSH was 3.50.  PSA was mildly increased at 4.76.  I will see him in the office in follow-up of above studies and further recommendations will be made at that time.   Medication Adjustments/Labs and Tests Ordered: Current medicines are reviewed at length with the patient today.  Concerns regarding medicines are outlined above.  Medication changes, Labs and Tests ordered today are listed in the Patient Instructions below. Patient Instructions  Your physician recommends that you return for lab work.  Your physician has requested that you have an echocardiogram. Echocardiography is a painless test that uses sound waves to create images of your heart. It provides your doctor with information about the size and shape of your heart and how well your heart's chambers and valves are working. This procedure takes approximately one hour. There are no restrictions for this procedure.  Your physician has recommended that you wear a 2 week event monitor. Event monitors are medical devices that record the heart's electrical activity. Doctors most often [place] these monitors to diagnose arrhythmias. Arrhythmias are problems with the speed or rhythm of the heartbeat. The monitor is a small, portable device. You can wear one while you do your normal daily activities. This is usually used to diagnose what is causing palpitations/syncope (passing out).   Your physician has requested that you have en exercise stress myoview. For further information please visit [email or web address] Please follow instruction sheet, as given.  Your physician recommends that you schedule a follow-up appointment in: 3-4  weeks.       Signed, Nicki Guadalajara, MD  05/22/2016 8:31 PM    [facility] [address], [place], [place]  [address] Phone: [phone]

## 2016-05-20 NOTE — Patient Instructions (Signed)
Your physician recommends that you return for lab work.  Your physician has requested that you have an echocardiogram. Echocardiography is a painless test that uses sound waves to create images of your heart. It provides your doctor with information about the size and shape of your heart and how well your heart's chambers and valves are working. This procedure takes approximately one hour. There are no restrictions for this procedure.  Your physician has recommended that you wear a 2 week event monitor. Event monitors are medical devices that record the heart's electrical activity. Doctors most often us these monitors to diagnose arrhythmias. Arrhythmias are problems with the speed or rhythm of the heartbeat. The monitor is a small, portable device. You can wear one while you do your normal daily activities. This is usually used to diagnose what is causing palpitations/syncope (passing out).   Your physician has requested that you have en exercise stress myoview. For further information please visit [email or web address]. Please follow instruction sheet, as given.  Your physician recommends that you schedule a follow-up appointment in: 3-4 weeks.

## 2016-05-21 ENCOUNTER — Telehealth (HOSPITAL_COMMUNITY): Payer: Self-pay

## 2016-05-21 ENCOUNTER — Ambulatory Visit (INDEPENDENT_AMBULATORY_CARE_PROVIDER_SITE_OTHER): Payer: Medicare Other | Admitting: Podiatry

## 2016-05-21 ENCOUNTER — Encounter: Payer: Self-pay | Admitting: Podiatry

## 2016-05-21 DIAGNOSIS — M79676 Pain in unspecified toe(s): Secondary | ICD-10-CM | POA: Diagnosis not present

## 2016-05-21 DIAGNOSIS — B351 Tinea unguium: Secondary | ICD-10-CM | POA: Diagnosis not present

## 2016-05-21 DIAGNOSIS — Q828 Other specified congenital malformations of skin: Secondary | ICD-10-CM

## 2016-05-21 LAB — MAGNESIUM: MAGNESIUM: 1.8 mg/dL (ref 1.5–2.5)

## 2016-05-21 NOTE — Telephone Encounter (Signed)
Encounter complete. 

## 2016-05-24 NOTE — Progress Notes (Signed)
Presents today with chief complaint of painful elongated toenails and calluses bilateral.  Objective: Pulses remain palpable no open lesions or wounds. Nails are long thick yellow dystrophic with mycotic porokeratotic lesions plantar aspect of the distal toes.  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  Plan: Debridement of toenails 1 through 5 bilateral debridement of all porokeratotic lesions. Follow up with him in 3 months.

## 2016-05-26 ENCOUNTER — Encounter (HOSPITAL_COMMUNITY): Payer: Self-pay | Admitting: *Deleted

## 2016-05-26 ENCOUNTER — Ambulatory Visit (HOSPITAL_COMMUNITY)
Admission: RE | Admit: 2016-05-26 | Discharge: 2016-05-26 | Disposition: A | Discharge status: Home / Self Care | Payer: Medicare Other | Source: Ambulatory Visit | Attending: Cardiology | Admitting: Cardiology

## 2016-05-26 DIAGNOSIS — R42 Dizziness and giddiness: Secondary | ICD-10-CM | POA: Diagnosis not present

## 2016-05-26 DIAGNOSIS — R55 Syncope and collapse: Secondary | ICD-10-CM | POA: Diagnosis not present

## 2016-05-26 DIAGNOSIS — R5383 Other fatigue: Secondary | ICD-10-CM | POA: Insufficient documentation

## 2016-05-26 DIAGNOSIS — R0602 Shortness of breath: Secondary | ICD-10-CM | POA: Diagnosis not present

## 2016-05-26 DIAGNOSIS — I2510 Atherosclerotic heart disease of native coronary artery without angina pectoris: Secondary | ICD-10-CM | POA: Diagnosis present

## 2016-05-26 DIAGNOSIS — I10 Essential (primary) hypertension: Secondary | ICD-10-CM | POA: Insufficient documentation

## 2016-05-26 DIAGNOSIS — E119 Type 2 diabetes mellitus without complications: Secondary | ICD-10-CM | POA: Diagnosis not present

## 2016-05-26 DIAGNOSIS — R9439 Abnormal result of other cardiovascular function study: Secondary | ICD-10-CM | POA: Insufficient documentation

## 2016-05-26 LAB — MYOCARDIAL PERFUSION IMAGING
CHL CUP NUCLEAR SDS: 0
CHL CUP NUCLEAR SRS: 3
CHL CUP RESTING HR STRESS: 34 {beats}/min
CSEPHR: 88 %
CSEPPHR: 126 {beats}/min
Estimated workload: 10.1 METS
Exercise duration (min): 8 min
Exercise duration (sec): 1 s
LV dias vol: 130 mL (ref 62–150)
LV sys vol: 69 mL
MPHR: 143 {beats}/min
NUC STRESS TID: 1.01
RPE: 18
SSS: 3

## 2016-05-26 MED ORDER — TECHNETIUM TC 99M TETROFOSMIN IV KIT
10.4000 | PACK | Freq: Once | INTRAVENOUS | Status: AC | PRN
  Administered 2016-05-26: 10.4 via INTRAVENOUS
  Filled 2016-05-26: qty 11

## 2016-05-26 MED ORDER — TECHNETIUM TC 99M TETROFOSMIN IV KIT
30.5000 | PACK | Freq: Once | INTRAVENOUS | Status: AC | PRN
  Administered 2016-05-26: 30.5 via INTRAVENOUS
  Filled 2016-05-26: qty 31

## 2016-05-26 NOTE — Progress Notes (Signed)
Dr Kelley reviewed Myoview study. Ok d/c pt home. 

## 2016-05-28 ENCOUNTER — Telehealth: Payer: Self-pay | Admitting: Cardiovascular Disease

## 2016-05-28 NOTE — Telephone Encounter (Signed)
New Message   Pt state he has questions about the event monitor he will receive on 5/22. Please call back to discuss

## 2016-05-29 NOTE — Telephone Encounter (Signed)
Called patient back and answered his questions.

## 2016-06-02 ENCOUNTER — Ambulatory Visit (INDEPENDENT_AMBULATORY_CARE_PROVIDER_SITE_OTHER): Payer: Medicare Other

## 2016-06-02 ENCOUNTER — Ambulatory Visit (HOSPITAL_COMMUNITY): Payer: Medicare Other | Attending: Cardiovascular Disease

## 2016-06-02 ENCOUNTER — Other Ambulatory Visit: Payer: Self-pay

## 2016-06-02 DIAGNOSIS — I081 Rheumatic disorders of both mitral and tricuspid valves: Secondary | ICD-10-CM | POA: Diagnosis not present

## 2016-06-02 DIAGNOSIS — E785 Hyperlipidemia, unspecified: Secondary | ICD-10-CM | POA: Insufficient documentation

## 2016-06-02 DIAGNOSIS — R001 Bradycardia, unspecified: Secondary | ICD-10-CM

## 2016-06-02 DIAGNOSIS — R55 Syncope and collapse: Secondary | ICD-10-CM | POA: Insufficient documentation

## 2016-06-03 ENCOUNTER — Telehealth: Payer: Self-pay | Admitting: Cardiovascular Disease

## 2016-06-03 ENCOUNTER — Telehealth: Payer: Self-pay | Admitting: Internal Medicine

## 2016-06-03 ENCOUNTER — Encounter: Payer: Self-pay | Admitting: Cardiovascular Disease

## 2016-06-03 NOTE — Telephone Encounter (Signed)
Returned call to patient. He is driving, [place] [facility]. Advised patient that he should come in for an appt w/MD for eval of low HR (explained monitor results received thus far). He states he can come in tmr @ 11:40am to see MD.

## 2016-06-03 NOTE — Telephone Encounter (Signed)
Received critical notification from [employer] monitoring service Event monitor ordered for dizziness, low HR Day 1 and day 2 of monitor demonstrate bradyarrhythmia (asymptomatic, patient just woke up per monitor tech notes).   5/22 @ approx 10pm CT 26bpm 5/23 @ approx 5am CT 20-30bpm w/5.4 sec pause  Monitor reviewed by DOD Dr. Herbie BaltimoreHarding who recommended MD f/up and EP eval Patient scheduled to see Dr. Tresa EndoKelly 5/24 @ 11:40am  Have attempted to contact patient and left messages on both #s listed

## 2016-06-03 NOTE — Telephone Encounter (Signed)
New Message ° ° pt verbalized that he is returning call for rn  °

## 2016-06-03 NOTE — Telephone Encounter (Signed)
I rec'd a call from [employer].   Holter with sinus pause of 4.4 seconds, PACs.   Both I and [employer] tried to call the patient without success. Per the chart, he has an appointment tomorrow at 11:40 AM to be evaluated for bradycardia.  Renae FickleAditya Jakaiya Netherland, MD Cardiology

## 2016-06-04 ENCOUNTER — Other Ambulatory Visit: Payer: Medicare Other | Admitting: *Deleted

## 2016-06-04 ENCOUNTER — Encounter: Payer: Self-pay | Admitting: Cardiovascular Disease

## 2016-06-04 ENCOUNTER — Ambulatory Visit (INDEPENDENT_AMBULATORY_CARE_PROVIDER_SITE_OTHER): Payer: Medicare Other | Admitting: Cardiovascular Disease

## 2016-06-04 ENCOUNTER — Telehealth: Payer: Self-pay | Admitting: Cardiovascular Disease

## 2016-06-04 ENCOUNTER — Ambulatory Visit
Admission: RE | Admit: 2016-06-04 | Discharge: 2016-06-04 | Disposition: A | Discharge status: Home / Self Care | Payer: Medicare Other | Source: Ambulatory Visit | Attending: Cardiovascular Disease | Admitting: Cardiovascular Disease

## 2016-06-04 VITALS — BP 152/76 | HR 36 | Ht 70.0 in | Wt 167.0 lb

## 2016-06-04 DIAGNOSIS — R001 Bradycardia, unspecified: Secondary | ICD-10-CM

## 2016-06-04 DIAGNOSIS — I517 Cardiomegaly: Secondary | ICD-10-CM

## 2016-06-04 DIAGNOSIS — I4589 Other specified conduction disorders: Secondary | ICD-10-CM | POA: Diagnosis not present

## 2016-06-04 DIAGNOSIS — I358 Other nonrheumatic aortic valve disorders: Secondary | ICD-10-CM

## 2016-06-04 DIAGNOSIS — I455 Other specified heart block: Secondary | ICD-10-CM

## 2016-06-04 DIAGNOSIS — R9431 Abnormal electrocardiogram [ECG] [EKG]: Secondary | ICD-10-CM | POA: Diagnosis not present

## 2016-06-04 DIAGNOSIS — Z01812 Encounter for preprocedural laboratory examination: Secondary | ICD-10-CM

## 2016-06-04 DIAGNOSIS — Z01811 Encounter for preprocedural respiratory examination: Secondary | ICD-10-CM

## 2016-06-04 NOTE — Progress Notes (Signed)
Cardiology Office Note    Date:  06/04/2016   ID:  Travis [name], DOB 25-Jan-1940, MRN [id number]  PCP:  System, Pcp Not In  Referring M.D.: Dr. Lorenda Ishihara  Cardiologist:  Nicki Guadalajara, MD   Chief Complaint  Patient presents with  . Follow-up    History of Present Illness:  Travis [name] is a 76 y.o. male who is referred through the courtesy of Dr.Varadarajan for evaluation of lightheadedness and dizziness while running.  Travis [name] has been active for his entire life.  He has a history of hypertension, hyperlipidemia, and has been diagnosed with type 2 diabetes mellitus for 14 years.  He exercises at least 6 days per week for 1-2 hours.  He does resistance training, spin classes, and typically runs anywhere from 5-6 miles at a 9-10 minute mile pace and has been doing so for many years.  His pulse typically runs in the low 30s.  Recently, he has begun to notice that he becomes dizzy when he starts to run.  This is associated with mild shortness of breath.  He denies any chest pain.  The symptoms have occurred since January.  He denies any frank syncope.  He denies the lightheaded symptoms when he is at rest and his symptoms only occur with significant activity.  When he does spin class he is unaware of any  symptoms.    When I saw for initial evaluation, his ECG revealed marked sinus bradycardia with ventricular rate averaging 37 bpm.  PR interval was 190 ms and QTc interval was 335 ms.  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  He had a 2/6 systolic murmur on physical exam, suggestive of possible aortic valve disease.  I scheduled him for a 2-D echo Doppler study which was done on 06/02/2016.  This showed an ejection fraction at 55-65%.  There was mild LVH.  He did not have any regional wall motion abnormalities.  His aortic valve was reported as structurally normal.  There was a mean gradient of 9 and a peak  gradient of 17.  There was mild MR and mild LA dilation.  His right atrium was mild moderately dilated.  I obtain a magnesium level, which was normal.  I scheduled him for an exercise nuclear stress test to assess both chronotropic competence to exercise as well as myocardial perfusion.  On his exercise portion.  His resting heart rate was 34 bpm, which rose to a maximum of 126/m.  He completed 2 minutes and 2 seconds into stage III of Bruce protocol.  He was able to augment his heart rate appropriately with exercise.  At the end of stage I reflective of good aerobic conditioning ventricular rate was 71, at the end of stage II 100, and in stage III maximum heart rate was 125.  He was entirely asymptomatic but had an exaggerated blood pressure response opted to 16/75 and at peak stress developed asymptomatic ST segment depression up to 3 mm in the inferior and inferolateral leads.  There was a very small mild distal anteroseptal defect which most likely was artifact.  There was no ischemia identified.  He has been wearing an event monitor.  We have been getting daily notifications demonstrating marked bradycardia with heart rates in the 30s.  These have been sinus rhythm.  He has been entirely asymptomatic.  He has developed sinus positives up to proximally 5 seconds.  On 06/03/2016 at 4:57  AM 5.4 second pause was noted.  This a.m., after awakening at 6:59 AM a 4.8 second pause was noted.  He again remains entirely asymptomatic and specifically denies any dizziness or lightheadedness.  He denies chest pain.  He is unaware of sleep apnea but he does snore.  He resents for follow-up cardiology evaluation.   Past Medical History:  Diagnosis Date  . Hyperlipidemia     No past surgical history on file.  Current Medications: Outpatient Medications Prior to Visit  Medication Sig Dispense Refill  . aspirin EC 81 MG tablet Take 81 mg by mouth daily.    Marland Kitchen. atorvastatin (LIPITOR) 10 MG tablet Take 10 mg by mouth  daily.    . COMBIGAN 0.2-0.5 % ophthalmic solution     . lisinopril-hydrochlorothiazide (PRINZIDE,ZESTORETIC) 10-12.5 MG per tablet Take 1 tablet by mouth daily.    . metFORMIN (GLUCOPHAGE) 500 MG tablet Take by mouth 2 (two) times daily with a meal.    . sildenafil (VIAGRA) 100 MG tablet Take 100 mg by mouth daily as needed for erectile dysfunction.     No facility-administered medications prior to visit.      Allergies:   Patient has no known allergies.   Social History   Social History  . Marital status: Single    Spouse name: N/A  . Number of children: N/A  . Years of education: N/A   Social History Main Topics  . Smoking status: Never Smoker  . Smokeless tobacco: Never Used  . Alcohol use Yes  . Drug use: No  . Sexual activity: Not Asked   Other Topics Concern  . None   Social History Narrative  . None    Social history is notable in that he was born in [place].  He's been married for 54 years.  He has 2 children, 7 grandchildren, and one great-grandchild.  He has a Event organisermasters degree in Engineer, technical saleslead engineering.  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  He is retired.  He drinks occasional beer.  He does not smoke.   Family History:  The patient's family history includes Colon cancer in his father..  He has a brother age 77 and a sister age 77, who are healthy.   ROS General: Negative; No fevers, chills, or night sweats;  HEENT: Negative; No changes in vision or hearing, sinus congestion, difficulty swallowing Pulmonary: Negative; No cough, wheezing, shortness of breath, hemoptysis Cardiovascular:  See HPI GI: Negative; No nausea, vomiting, diarrhea, or abdominal pain GU: Negative; No dysuria, hematuria, or difficulty voiding Musculoskeletal: Negative; no myalgias, joint pain, or weakness Hematologic/Oncology: Negative; no easy bruising, bleeding Endocrine: Negative; no heat/cold intolerance; no diabetes Neuro: Negative; no changes in balance,  headaches Skin: Negative; No rashes or skin lesions Psychiatric: Negative; No behavioral problems, depression Sleep: Positive for snoring, no daytime sleepiness, hypersomnolence, bruxism, restless legs, hypnogognic hallucinations, no cataplexy Other comprehensive 14 point system review is negative.   PHYSICAL EXAM:   VS:  BP (!) 152/76   Pulse (!) 36   Ht 5\' 10"  (1.778 m)   Wt 167 lb (75.8 kg)   BMI 23.96 kg/m     Repeat blood pressure was 142/80.  He was entirely asymptomatic. Wt Readings from Last 3 Encounters:  06/04/16 167 lb (75.8 kg)  05/26/16 165 lb (74.8 kg)  05/20/16 165 lb (74.8 kg)    General: Alert, oriented, no distress.  Skin: normal turgor, no rashes, warm and dry HEENT: Normocephalic, atraumatic. Pupils equal  round and reactive to light; sclera anicteric; extraocular muscles intact; Fundi Normal Nose without nasal septal hypertrophy Mouth/Parynx benign; Mallinpatti scale 2 Neck: No JVD, no carotid bruits; normal carotid upstroke Lungs: clear to ausculatation and percussion; no wheezing or rales Chest wall: without tenderness to palpitation Heart: PMI not displaced, bradycardic with heart rate in the upper 30s-40s, s1 s2 normal, 2/6 systolic murmur in the aortic area, no diastolic murmur, no rubs, gallops, thrills, or heaves Abdomen: soft, nontender; no hepatosplenomehaly, BS+; abdominal aorta nontender and not dilated by palpation. Back: no CVA tenderness Pulses 2+ Musculoskeletal: full range of motion, normal strength, no joint deformities Extremities: no clubbing cyanosis or edema, Homan's sign negative  Neurologic: grossly nonfocal; Cranial nerves grossly wnl Psychologic: Normal mood and affect   Studies/Labs Reviewed:   EKG:  EKG is ordered today.  ECG (independently read by me): Marked sinus bradycardia with possible low atrial involvement.  PR interval 166 ms, QTc interval 335 ms.  Ventricular rate 36.  05/20/2016 ECG (independently read by me): Marked  sinus bradycardia with sinus arrhythmia, ventricular rate in the 40s to upper 30s, average 37.  PR interval 190 ms.  QTc interval 335 ms.  No ST segment changes.  Recent Labs: No flowsheet data found.   No flowsheet data found.  No flowsheet data found. No results found for: MCV No results found for: TSH No results found for: HGBA1C   BNP No results found for: BNP  ProBNP No results found for: PROBNP   Lipid Panel  No results found for: CHOL, TRIG, HDL, CHOLHDL, VLDL, LDLCALC, LDLDIRECT   RADIOLOGY: No results found.   Additional studies/ records that were reviewed today include:  I reviewed the recent records from [facility]. I have reviewed his 2-D echo Doppler study, exercise Myoview study, laboratory, as well as daily.  Monitors with critical notification from the event monitor that he is currently still wearing.    ASSESSMENT:    1. Bradycardia   2. Sinus pause   3. Abnormal ECG during exercise stress test   4. Aortic systolic murmur on examination   5. Left ventricular hypertrophy   6. Pre-op chest exam   7. Pre-procedure lab exam   8. Chronotropic incompetence      PLAN:  Travis [name] is a very pleasant, active and healthy-appearing 76 year old gentleman who has a long-standing history of significant exercise and currently exercises for at least 1-2 hours per day 6 days per week.  He often runs 5-6 miles at a time and does spin class.  Typically, as result of his aerobic conditioning, he has had long-standing history of sinus bradycardia.  Recently, he has begun to notice dizziness when he is running.  He denies the symptoms when he is doing spin class or resistance training.  As part of his recent evaluation, a 2-D echo Doppler study has shown mild LVH with normal systolic function with an EF of 55-60%.  He did not have significant valvular aortic stenosis in the was mention of normal excursion, although a mild gradient was noted.  He has demonstrated to have  chronotropic competence and was able to increase his heart rate to 125 bpm.  During stage III of the standard Bruce protocol he developed asymptomatic ST segment depression concerning for potential ischemia with up to 3 mm apart resolved inferolateral ST segment changes.  However , there appeared to be fairly normal perfusion with probable mild apical defect, more consistent with artifact rather than scar or ischemia.  He  has continued to have asymptomatic bradycardia and on monitoring has been now demonstrated to have marked sinus bradycardia with pauses occurring during sleep and in the early morning for which again he remains entirely asymptomatic.  With his marked ECG changes even though perfusion appears normal, it is my recommendation that he undergo definitive cardiac catheterization to completely exclude obstructive epicardial CAD.  Is certainly possible he may have small vessel disease.  With his prolonged pauses.  He meets criteria for permanent pacemaker insertion.  I'll try to arrange this in the office today such that tomorrow he will undergo diagnostic cardiac catheterization as well as permanent pacemaker implantation.  However, the schedule does not allow this.  With the patient remaining entirely asymptomatic, I have been contacted Dr. Royann Shivers and discussed the timing of pacemaker implantation with him.  Since the patient is entirely is asymptomatic despite having the sinus pauses.  He felt the pacemaker can be deferred to early next week.  As result, we will schedule him to undergo cardiac catheterization and pacemaker implantation the same day schedule this to be done on Wednesday, May 30.  I will not be in the Cath Lab that day and I will set him up for cardiac catheterization to be done by one of my partners.  The patient was advised not to drive.  He will continue to wear his event monitor and take it off after arrival to the hospital for his procedures.  If he starts to develop any symptoms  of presyncope or syncope he was advised to come to the hospital.  A chest x-ray will be obtained today.  He will be undergo laboratory for preoperative assessment.  The risks and benefits of the procedures were discussed with the patient.  I also had Dr. Royann Shivers come in to talk to the patient regarding the pacemaker implantation and plan strategy.  Time spent: 60 minutes Medication Adjustments/Labs and Tests Ordered: Current medicines are reviewed at length with the patient today.  Concerns regarding medicines are outlined above.  Medication changes, Labs and Tests ordered today are listed in the Patient Instructions below. Patient Instructions    [facility]IOVASCULAR DIV[facility] [address] [place] [address] Dept: [phone] Loc: [phone]  Travis [name]  06/04/2016  You are scheduled for a heart cath on Wednesday May 30th @ 9:00 am  1. Please arrive at the [facility] (Main Entrance A) at [facility]: [address] [place], [place] [address] at  (two hours before your procedure to ensure your preparation). Free valet parking service is available.   Special note: Every effort is made to have your procedure done on time. Please understand that emergencies sometimes delay scheduled procedures.  2. Diet: DO NOT EAT OR DRINK AFTER MIDNIGHT ON Tuesday MAY 29th  3. Labs:   4. Medication instructions in preparation for your procedure:   DO NOT TAKE THE LISINOPRIL THE MORNING OF THE PROCEDURE.    On the morning of your procedure, take your  and any morning medicines NOT listed above.  You may use sips of water.  5. Plan for one night stay--bring personal belongings. 6. Bring a current list of your medications and current insurance cards. 7. You MUST have a responsible person to drive you home. 8. Someone MUST be with you the first 24 hours after you arrive home or your discharge will be delayed. 9.  Please wear clothes that are easy to get on and  off and wear slip-on shoes.  Thank you for allowing [place] to care for you!   -- [facility]    Signed, Nicki Guadalajara, MD  06/04/2016 3:52 PM    [facility] [address], [address], [place], [place]  [address] Phone: [phone]

## 2016-06-04 NOTE — Telephone Encounter (Signed)
New message    [employer] calling with critical EKG

## 2016-06-04 NOTE — Telephone Encounter (Signed)
Follow Up   Maisie Fushomas calling again from [employer] for another critical EKG and requests a call back

## 2016-06-04 NOTE — Telephone Encounter (Signed)
Spoke with Sheran LawlessMadeline at [employer] about pt having a critical Brady ECG. She states that the duration of sinus brady was 4.8 sec, when called pt states no symtoms and this was auto triggered by monitor.  She states that in her notes one of the on call Dr's left message  that pt is well conditioned and that no more calls should be made on asymptomatic bradycardic episodes or pauses. Sheran LawlessMadeline states that Dr Tresa EndoKelly must also agree to make this happen, please ask Dr Tresa EndoKelly if this is ok.  S/w Dr Tresa EndoKelly he states that this is nothing new this is why it was ordered and this is normal for him he will discuss this with pt at appt today @1140am . [facility] about these pauses and bradycardic episodes, per Dr Tresa EndoKelly he will see pt today at [facility] and decide if pt should be called or not.

## 2016-06-04 NOTE — Telephone Encounter (Signed)
Spoke with Dr Tresa EndoKelly he states that pt is going to be getting a pacemaker but he still he wants to know when this happens but this Brady/pause is normal for pt but not critical-he is keeping track of the interval pauses...[employer] notified they will leave message that it happened and release report for us to print and give to Dr Tresa EndoKelly.  Printed current report from 12:45:11 06-04-2016 and given to Dr Tresa EndoKelly 5.0 sec pause

## 2016-06-04 NOTE — Patient Instructions (Signed)
   [facility] [address] [place] [address] Dept: [phone] Loc: [phone]  Rosilyn MingsJames F Toren  06/04/2016  You are scheduled for a heart cath on Wednesday May 30th @ 9:00 am  1. [facility] at [facility] : [address] [place], [place] [address] at  (two hours before your procedure to ensure your preparation). Free valet parking service is available.   Special note: Every effort is made to have your procedure done on time. Please understand that emergencies sometimes delay scheduled procedures.  2. Diet: DO NOT EAT OR DRINK AFTER MIDNIGHT ON Tuesday MAY 29th  3. Labs:   4. Medication instructions in preparation for your procedure:   DO NOT TAKE THE LISINOPRIL THE MORNING OF THE PROCEDURE.    On the morning of your procedure, take your  and any morning medicines NOT listed above.  You may use sips of water.  5. Plan for one night stay--bring personal belongings. 6. Bring a current list of your medications and current insurance cards. 7. You MUST have a responsible person to drive you home. 8. Someone MUST be with you the first 24 hours after you arrive home or your discharge will be delayed. 9. Please wear clothes that are easy to get on and off and wear slip-on shoes.  Thank you for allowing us to care for you!   -- [place] [facility]

## 2016-06-04 NOTE — Progress Notes (Signed)
[id number] 

## 2016-06-04 NOTE — Progress Notes (Signed)
Cardiology Office Note:    Date:  06/04/2016   ID:  Travis [name], DOB 02/03/1940, MRN [id number]  PCP:  System, Pcp Not In  Cardiologist:  Travis Frances, MD    Referring MD: Osborne, Drey C, MD   Chief Complaint  Patient presents with  . Severe sinus bradycardia and sinus pauses     History of Present Illness:    Travis [name] is a 76 y.o. male with a hx of Hypertension and hyperlipidemia, without known coronary vascular problems to date, who has been found to have severe sinus bradycardia, periods of junctional escape rhythm and sinus pauses of up to 4.8 seconds in duration on an event monitor. He has significant resting bradycardia but normal heart rate response to physical activity. He has excellent functional status and enjoys running and spinning classes. He has not experienced syncope. The episodes of pauses were documented repeatedly and were not associated with vasovagal symptoms. He has never experienced syncope.  His treadmill stress test showed an abnormal ECG response to exercise with profound ST segment depression, but he did not experience angina and his nuclear perfusion images show minimal abnormalities. He is scheduled to go undergo an angiogram next Wednesday and I was asked by Travis [name] to discuss pacemaker implantation on the same date.  Past Medical History:  Diagnosis Date  . Hyperlipidemia     No past surgical history on file.  Current Medications: Current Meds  Medication Sig  . aspirin EC 81 MG tablet Take 81 mg by mouth daily.  . atorvastatin (LIPITOR) 10 MG tablet Take 10 mg by mouth daily.  . COMBIGAN 0.2-0.5 % ophthalmic solution   . lisinopril-hydrochlorothiazide (PRINZIDE,ZESTORETIC) 10-12.5 MG per tablet Take 1 tablet by mouth daily.  . metFORMIN (GLUCOPHAGE) 500 MG tablet Take by mouth 2 (two) times daily with a meal.  . sildenafil (VIAGRA) 100 MG tablet Take 100 mg by mouth daily as needed for erectile dysfunction.     Allergies:   Patient  has no known allergies.   Social History   Social History  . Marital status: Single    Spouse name: N/A  . Number of children: N/A  . Years of education: N/A   Social History Main Topics  . Smoking status: Never Smoker  . Smokeless tobacco: Never Used  . Alcohol use Yes  . Drug use: No  . Sexual activity: Not Asked   Other Topics Concern  . None   Social History Narrative  . None     Family History: The patient's family history includes Colon cancer in his father. ROS:   Please see the history of present illness.    All other systems reviewed and are negative.  EKGs/Labs/Other Studies Reviewed:    The following studies were reviewed today: Nuclear stress test, echo, 14 day event monitor  EKG:  EKG is  ordered today.  The ekg ordered today demonstrates supraventricular rhythm with unusual P-wave and very short PR interval suggestive of low atrial/junctional rhythm at about 40 bpm.  Recent Labs: 05/20/2016: Magnesium 1.8  Normal TSH 3.50  Physical Exam:    VS:  BP (!) 152/76   Pulse (!) 36   Ht 5' 10" (1.778 m)   Wt 167 lb (75.8 kg)   BMI 23.96 kg/m     Wt Readings from Last 3 Encounters:  06/04/16 167 lb (75.8 kg)  05/26/16 165 lb (74.8 kg)  05/20/16 165 lb (74.8 kg)     GEN:  Well nourished, well   developed in no acute distress HEENT: Normal NECK: No JVD; No carotid bruits LYMPHATICS: No lymphadenopathy CARDIAC: Bradycardia RRR, no diastolic murmurs, rubs, gallops. 1-2/6 aortic ejection murmur, early peaking. RESPIRATORY:  Clear to auscultation without rales, wheezing or rhonchi  ABDOMEN: Soft, non-tender, non-distended MUSCULOSKELETAL:  No edema; No deformity  SKIN: Warm and dry NEUROLOGIC:  Alert and oriented x 3 PSYCHIATRIC:  Normal affect   ASSESSMENT:    1. Bradycardia    PLAN:    In order of problems listed above:  1. Despite the absence of symptoms, Travis [name] has severe bradycardia with pauses of almost 5 seconds in duration. Even if  these are asymptomatic, he meets criteria for implantation of a dual-chamber permanent pacemaker for sinus node dysfunction. This procedure has been fully reviewed with the patient and informed consent has been obtained. Unless his cardiac catheterization shows an explanation for the abnormality (for example ostial critical right coronary artery stenosis), I think he should undergo implantation of a device. We reviewed the potential complications of bleeding this large one, need for reoperation, perforation and possible bleeding, pneumothorax and possible need for chest tube implantation, infection, etc. He is tentatively scheduled for cardiac catheterization and possible revascularization on Wednesday morning, pacemaker implantation that same afternoon.    Medication Adjustments/Labs and Tests Ordered: Current medicines are reviewed at length with the patient today.  Concerns regarding medicines are outlined above. Labs and tests ordered and medication changes are outlined in the patient instructions below:  Patient Instructions    [facility] [address] [place] [address] Dept: [phone] Loc: [phone]  Travis [name]  06/04/2016  You are scheduled for a heart cath on Wednesday May 30th @ 9:00 am  1. Please arrive at the [facility] (Main Entrance A) at [facility]: [address] [place], [place] [address] at  (two hours before your procedure to ensure your preparation). Free valet parking service is available.   Special note: Every effort is made to have your procedure done on time. Please understand that emergencies sometimes delay scheduled procedures.  2. Diet: DO NOT EAT OR DRINK AFTER MIDNIGHT ON Tuesday MAY 29th  3. Labs:   4. Medication instructions in preparation for your procedure:   DO NOT TAKE THE LISINOPRIL THE MORNING OF THE PROCEDURE.    On the morning of your  procedure, take your  and any morning medicines NOT listed above.  You may use sips of water.  5. Plan for one night stay--bring personal belongings. 6. Bring a current list of your medications and current insurance cards. 7. You MUST have a responsible person to drive you home. 8. Someone MUST be with you the first 24 hours after you arrive home or your discharge will be delayed. 9. Please wear clothes that are easy to get on and off and wear slip-on shoes.  Thank you for allowing us to care for you!   -- [facility]    Signed, Kule Gascoigne, MD  06/04/2016 1:06 PM    [facility]  

## 2016-06-04 NOTE — Telephone Encounter (Signed)
New message     Critical ekg for [employer]

## 2016-06-05 ENCOUNTER — Telehealth: Payer: Self-pay | Admitting: *Deleted

## 2016-06-05 LAB — CBC
Hematocrit: 41.9 % (ref 37.5–51.0)
Hemoglobin: 14.2 g/dL (ref 13.0–17.7)
MCH: 32.6 pg (ref 26.6–33.0)
MCHC: 33.9 g/dL (ref 31.5–35.7)
MCV: 96 fL (ref 79–97)
PLATELETS: 260 10*3/uL (ref 150–379)
RBC: 4.35 x10E6/uL (ref 4.14–5.80)
RDW: 13 % (ref 12.3–15.4)
WBC: 7.1 10*3/uL (ref 3.4–10.8)

## 2016-06-05 LAB — BASIC METABOLIC PANEL
BUN / CREAT RATIO: 21 (ref 10–24)
BUN: 24 mg/dL (ref 8–27)
CO2: 20 mmol/L (ref 18–29)
CREATININE: 1.16 mg/dL (ref 0.76–1.27)
Calcium: 9.5 mg/dL (ref 8.6–10.2)
Chloride: 104 mmol/L (ref 96–106)
GFR, EST AFRICAN AMERICAN: 70 mL/min/{1.73_m2} (ref >59)
GFR, EST NON AFRICAN AMERICAN: 60 mL/min/{1.73_m2} (ref >59)
Glucose: 118 mg/dL — ABNORMAL HIGH (ref 65–99)
POTASSIUM: 4.7 mmol/L (ref 3.5–5.2)
SODIUM: 138 mmol/L (ref 134–144)

## 2016-06-05 LAB — PROTIME-INR
INR: 1 (ref 0.8–1.2)
Prothrombin Time: 10.6 s (ref 9.1–12.0)

## 2016-06-05 LAB — PLEASE NOTE

## 2016-06-05 LAB — APTT: aPTT: 26 s (ref 24–33)

## 2016-06-05 NOTE — Telephone Encounter (Signed)
Spoke with pt, he had an episode of 4.2 sec pause with heart rate of 40 bpm. He reports he feels wonderful.

## 2016-06-05 NOTE — Progress Notes (Signed)
Called Travis [name], notified him that cath was scheduled for Wednesday May 30th at 1000, needs to arrive at admitting at 0800.  Nothing to eat or drink after midnight Tuesday night.  Also informed patient was scheduled for PPM at 1300.  Instructed to hold Lisinopril and Glucophage on Monday and Tuesday next week.

## 2016-06-07 ENCOUNTER — Telehealth: Payer: Self-pay | Admitting: Cardiology

## 2016-06-07 DIAGNOSIS — I480 Paroxysmal atrial fibrillation: Secondary | ICD-10-CM

## 2016-06-07 DIAGNOSIS — I455 Other specified heart block: Secondary | ICD-10-CM

## 2016-06-07 DIAGNOSIS — I495 Sick sinus syndrome: Secondary | ICD-10-CM

## 2016-06-07 NOTE — Telephone Encounter (Signed)
Pt with Hr in 30s at 737 AM today, up to 55, he had 2 pauses 4.1 sec and 3.8 sec pause, [place]  But at 737 he was home without complaints  He will call back if problems.

## 2016-06-07 NOTE — Telephone Encounter (Signed)
[employer] called with sinus pause at 4 second pause with HR 50-55bpm and patient was asymptomatic.  Will continue to monitor as long as patient asymptomatic.  He is on for PPM next Wednesday.

## 2016-06-08 ENCOUNTER — Encounter: Payer: Self-pay | Admitting: Cardiovascular Disease

## 2016-06-08 NOTE — Telephone Encounter (Signed)
Patient was seen in the office on May 24 and is scheduled to undergo cardiac catheterization and permanent pacemaker insertion.

## 2016-06-09 ENCOUNTER — Ambulatory Visit: Payer: Medicare Other | Admitting: Cardiovascular Disease

## 2016-06-09 ENCOUNTER — Telehealth: Payer: Self-pay | Admitting: *Deleted

## 2016-06-09 ENCOUNTER — Telehealth: Payer: Self-pay | Admitting: Cardiovascular Disease

## 2016-06-09 ENCOUNTER — Encounter: Payer: Self-pay | Admitting: Cardiovascular Disease

## 2016-06-09 NOTE — Telephone Encounter (Signed)
Mariana KaufmanMarvin reports @ 7:09 am this morning (cst) patient with sinus bradycardia and 4.2 sec pause. Spoke with pt, he reports a couple seconds of lightheadedness this morning that does not correlate with the monitor report. Currently he feels fine. He is scheduled for the pacemaker insertion tomorrow and will take the monitor off tonight prior to the wash prep for his procedure.

## 2016-06-09 NOTE — Telephone Encounter (Signed)
Received call back from patient-patient reports he is feeling great and denies symptoms at this time.  Advised per Dr. Tresa EndoKelly, do not drive and if become symptomatic go to ER.    Patient verbalized understanding.

## 2016-06-09 NOTE — Telephone Encounter (Signed)
Returned call re: event monitor reports  Spoke to [employer] at [employer] who identifies ~5 events in past 12 hrs, including 5.7 sec pause we received call for earlier.  She notes pt has had pauses ranging from 4-6 seconds approx every 1-2 hrs, routinely going into sinus brady afterwards for 12-30 seconds before resuming NSR.  She is forwarding event strips to [facility] clinical fax #.  Unable to reach pt at this time but have left VM for him to call.  Routed to Dr. Tresa EndoKelly as Lorain Childesfyi.

## 2016-06-09 NOTE — Telephone Encounter (Signed)
Received call- critical EKG.  Patient had 5.7 sec pause followed by HR of 30 for approximately 30 secs at 8 AM Central time.   F/u strip HR 60.  Previous telephone note stated patient had episode of dizziness that did not correlate to previous pause (possibly related to this episode?).    Spoke to Dr. Tresa EndoKelly and Dr. Drenda Freezeroitoru-advised if becomes symptomatic go to ER.  Patient scheduled for PPM implant and cath tomorrow 5/30.    Attempt to call patient-lmtcb.

## 2016-06-09 NOTE — Telephone Encounter (Signed)
New message     Criticial EKG results

## 2016-06-09 NOTE — Telephone Encounter (Signed)
Strips at Dr. Landry DykeKelly's desk for review/signature. Wanda aware. Note pt has received prior instruction from our office today to go to ED if any symptoms.

## 2016-06-09 NOTE — Telephone Encounter (Signed)
Thomas [employer]ered loss adjuster( [employer]) is calling about a Critcal EKG

## 2016-06-10 ENCOUNTER — Encounter (HOSPITAL_COMMUNITY)
Admission: RE | Disposition: A | Discharge status: Home / Self Care | Payer: Self-pay | Source: Ambulatory Visit | Attending: Cardiovascular Disease

## 2016-06-10 ENCOUNTER — Ambulatory Visit (HOSPITAL_COMMUNITY)
Admission: RE | Admit: 2016-06-10 | Discharge: 2016-06-11 | Disposition: A | Discharge status: Home / Self Care | Payer: Medicare Other | Source: Ambulatory Visit | Attending: Cardiovascular Disease | Admitting: Cardiovascular Disease

## 2016-06-10 ENCOUNTER — Ambulatory Visit (HOSPITAL_COMMUNITY)
Admission: RE | Disposition: A | Discharge status: Home / Self Care | Payer: Self-pay | Source: Ambulatory Visit | Attending: Cardiovascular Disease

## 2016-06-10 ENCOUNTER — Other Ambulatory Visit: Payer: Self-pay

## 2016-06-10 ENCOUNTER — Encounter (HOSPITAL_COMMUNITY): Payer: Self-pay | Admitting: Internal Medicine

## 2016-06-10 DIAGNOSIS — Z79899 Other long term (current) drug therapy: Secondary | ICD-10-CM | POA: Insufficient documentation

## 2016-06-10 DIAGNOSIS — I2510 Atherosclerotic heart disease of native coronary artery without angina pectoris: Secondary | ICD-10-CM | POA: Diagnosis not present

## 2016-06-10 DIAGNOSIS — I495 Sick sinus syndrome: Secondary | ICD-10-CM | POA: Diagnosis not present

## 2016-06-10 DIAGNOSIS — Z7982 Long term (current) use of aspirin: Secondary | ICD-10-CM | POA: Diagnosis not present

## 2016-06-10 DIAGNOSIS — Z7984 Long term (current) use of oral hypoglycemic drugs: Secondary | ICD-10-CM | POA: Diagnosis not present

## 2016-06-10 DIAGNOSIS — Z800 Family history of malignant neoplasm of digestive organs: Secondary | ICD-10-CM | POA: Diagnosis not present

## 2016-06-10 DIAGNOSIS — Z950 Presence of cardiac pacemaker: Secondary | ICD-10-CM | POA: Diagnosis present

## 2016-06-10 DIAGNOSIS — I455 Other specified heart block: Secondary | ICD-10-CM | POA: Diagnosis not present

## 2016-06-10 DIAGNOSIS — I10 Essential (primary) hypertension: Secondary | ICD-10-CM | POA: Insufficient documentation

## 2016-06-10 DIAGNOSIS — R9439 Abnormal result of other cardiovascular function study: Secondary | ICD-10-CM

## 2016-06-10 DIAGNOSIS — E785 Hyperlipidemia, unspecified: Secondary | ICD-10-CM | POA: Diagnosis not present

## 2016-06-10 DIAGNOSIS — I480 Paroxysmal atrial fibrillation: Secondary | ICD-10-CM

## 2016-06-10 HISTORY — DX: Essential (primary) hypertension: I10

## 2016-06-10 HISTORY — DX: Cardiac murmur, unspecified: R01.1

## 2016-06-10 HISTORY — DX: Sick sinus syndrome: I49.5

## 2016-06-10 HISTORY — PX: CARDIAC CATHETERIZATION: SHX172

## 2016-06-10 HISTORY — PX: INSERT / REPLACE / REMOVE PACEMAKER: SUR710

## 2016-06-10 HISTORY — DX: Personal history of other medical treatment: Z92.89

## 2016-06-10 HISTORY — DX: Presence of cardiac pacemaker: Z95.0

## 2016-06-10 HISTORY — PX: PACEMAKER IMPLANT: EP1218

## 2016-06-10 HISTORY — DX: Personal history of urinary calculi: Z87.442

## 2016-06-10 HISTORY — PX: LEFT HEART CATH AND CORONARY ANGIOGRAPHY: CATH118249

## 2016-06-10 HISTORY — DX: Type 2 diabetes mellitus without complications: E11.9

## 2016-06-10 LAB — GLUCOSE, CAPILLARY
GLUCOSE-CAPILLARY: 137 mg/dL — AB (ref 65–99)
Glucose-Capillary: 125 mg/dL — ABNORMAL HIGH (ref 65–99)
Glucose-Capillary: 90 mg/dL (ref 65–99)

## 2016-06-10 LAB — SURGICAL PCR SCREEN
MRSA, PCR: NEGATIVE
Staphylococcus aureus: NEGATIVE

## 2016-06-10 SURGERY — PACEMAKER IMPLANT
Anesthesia: LOCAL

## 2016-06-10 SURGERY — LEFT HEART CATH AND CORONARY ANGIOGRAPHY
Anesthesia: LOCAL

## 2016-06-10 MED ORDER — ONDANSETRON HCL 4 MG/2ML IJ SOLN: 4.0000 mg | Freq: Four times a day (QID) | INTRAMUSCULAR | Status: DC | PRN

## 2016-06-10 MED ORDER — LIDOCAINE HCL (PF) 1 % IJ SOLN
INTRAMUSCULAR | Status: DC | PRN
  Administered 2016-06-10: 35 mL via SUBCUTANEOUS

## 2016-06-10 MED ORDER — LIDOCAINE HCL 1 % IJ SOLN
INTRAMUSCULAR | Status: AC
  Filled 2016-06-10: qty 20

## 2016-06-10 MED ORDER — LIDOCAINE HCL (PF) 1 % IJ SOLN
INTRAMUSCULAR | Status: DC | PRN
  Administered 2016-06-10: 2 mL via INTRADERMAL

## 2016-06-10 MED ORDER — VERAPAMIL HCL 2.5 MG/ML IV SOLN
INTRAVENOUS | Status: AC
  Filled 2016-06-10: qty 2

## 2016-06-10 MED ORDER — MIDAZOLAM HCL 5 MG/5ML IJ SOLN
INTRAMUSCULAR | Status: DC | PRN
Start: 2016-06-10 — End: 2016-06-10
  Administered 2016-06-10 (×3): 1 mg via INTRAVENOUS

## 2016-06-10 MED ORDER — FENTANYL CITRATE (PF) 100 MCG/2ML IJ SOLN
INTRAMUSCULAR | Status: DC | PRN
Start: 2016-06-10 — End: 2016-06-10
  Administered 2016-06-10 (×3): 25 ug via INTRAVENOUS

## 2016-06-10 MED ORDER — ASPIRIN 81 MG PO CHEW
CHEWABLE_TABLET | ORAL | Status: AC
  Administered 2016-06-10: 81 mg
  Filled 2016-06-10: qty 1

## 2016-06-10 MED ORDER — HYDROCHLOROTHIAZIDE 12.5 MG PO CAPS
12.5000 mg | ORAL_CAPSULE | Freq: Every day | ORAL | Status: DC
  Administered 2016-06-11: 12.5 mg via ORAL
  Filled 2016-06-10: qty 1

## 2016-06-10 MED ORDER — MIDAZOLAM HCL 5 MG/5ML IJ SOLN
INTRAMUSCULAR | Status: AC
  Filled 2016-06-10: qty 5

## 2016-06-10 MED ORDER — ACETAMINOPHEN 325 MG PO TABS: 325.0000 mg | ORAL_TABLET | ORAL | Status: DC | PRN

## 2016-06-10 MED ORDER — HEPARIN (PORCINE) IN NACL 2-0.9 UNIT/ML-% IJ SOLN
INTRAMUSCULAR | Status: AC | PRN
  Administered 2016-06-10: 1000 mL

## 2016-06-10 MED ORDER — ACETAMINOPHEN 325 MG PO TABS
650.0000 mg | ORAL_TABLET | ORAL | Status: DC | PRN
  Administered 2016-06-10: 650 mg via ORAL
  Filled 2016-06-10: qty 2

## 2016-06-10 MED ORDER — TIMOLOL MALEATE 0.5 % OP SOLN
1.0000 [drp] | Freq: Two times a day (BID) | OPHTHALMIC | Status: DC
  Administered 2016-06-10 – 2016-06-11 (×2): 1 [drp] via OPHTHALMIC
  Filled 2016-06-10: qty 5

## 2016-06-10 MED ORDER — BRIMONIDINE TARTRATE 0.2 % OP SOLN
1.0000 [drp] | Freq: Two times a day (BID) | OPHTHALMIC | Status: DC
  Administered 2016-06-10 – 2016-06-11 (×2): 1 [drp] via OPHTHALMIC
  Filled 2016-06-10: qty 5

## 2016-06-10 MED ORDER — ASPIRIN EC 81 MG PO TBEC
81.0000 mg | DELAYED_RELEASE_TABLET | Freq: Every day | ORAL | Status: DC
  Administered 2016-06-11: 81 mg via ORAL
  Filled 2016-06-10: qty 1

## 2016-06-10 MED ORDER — SODIUM CHLORIDE 0.9% FLUSH
3.0000 mL | Freq: Two times a day (BID) | INTRAVENOUS | Status: DC
  Administered 2016-06-11 (×2): 3 mL via INTRAVENOUS

## 2016-06-10 MED ORDER — SODIUM CHLORIDE 0.9 % IV SOLN: INTRAVENOUS | Status: DC

## 2016-06-10 MED ORDER — SODIUM CHLORIDE 0.9 % WEIGHT BASED INFUSION: 1.0000 mL/kg/h | INTRAVENOUS | Status: DC

## 2016-06-10 MED ORDER — SODIUM CHLORIDE 0.9% FLUSH: 3.0000 mL | INTRAVENOUS | Status: DC | PRN

## 2016-06-10 MED ORDER — SODIUM CHLORIDE 0.9% FLUSH: 3.0000 mL | Freq: Two times a day (BID) | INTRAVENOUS | Status: DC

## 2016-06-10 MED ORDER — HEPARIN (PORCINE) IN NACL 2-0.9 UNIT/ML-% IJ SOLN
INTRAMUSCULAR | Status: DC | PRN
  Administered 2016-06-10: 10 mL via INTRA_ARTERIAL

## 2016-06-10 MED ORDER — METFORMIN HCL 500 MG PO TABS: 500.0000 mg | ORAL_TABLET | Freq: Two times a day (BID) | ORAL | Status: DC

## 2016-06-10 MED ORDER — MUPIROCIN 2 % EX OINT
TOPICAL_OINTMENT | CUTANEOUS | Status: AC
  Administered 2016-06-10: 1 via TOPICAL
  Filled 2016-06-10: qty 22

## 2016-06-10 MED ORDER — SODIUM CHLORIDE 0.9 % IV SOLN: 250.0000 mL | INTRAVENOUS | Status: DC | PRN

## 2016-06-10 MED ORDER — HEPARIN SODIUM (PORCINE) 1000 UNIT/ML IJ SOLN
INTRAMUSCULAR | Status: AC
  Filled 2016-06-10: qty 1

## 2016-06-10 MED ORDER — IOPAMIDOL (ISOVUE-370) INJECTION 76%
INTRAVENOUS | Status: DC | PRN
Start: 2016-06-10 — End: 2016-06-10
  Administered 2016-06-10: 60 mL via INTRA_ARTERIAL

## 2016-06-10 MED ORDER — FENTANYL CITRATE (PF) 100 MCG/2ML IJ SOLN
INTRAMUSCULAR | Status: AC
  Filled 2016-06-10: qty 2

## 2016-06-10 MED ORDER — IOPAMIDOL (ISOVUE-370) INJECTION 76%
INTRAVENOUS | Status: DC | PRN
  Administered 2016-06-10: 20 mL via INTRAVENOUS

## 2016-06-10 MED ORDER — LISINOPRIL-HYDROCHLOROTHIAZIDE 10-12.5 MG PO TABS: 1.0000 | ORAL_TABLET | Freq: Every day | ORAL | Status: DC

## 2016-06-10 MED ORDER — ASPIRIN 81 MG PO CHEW: 81.0000 mg | CHEWABLE_TABLET | ORAL | Status: DC

## 2016-06-10 MED ORDER — HEPARIN (PORCINE) IN NACL 2-0.9 UNIT/ML-% IJ SOLN
INTRAMUSCULAR | Status: AC
  Filled 2016-06-10: qty 500

## 2016-06-10 MED ORDER — SODIUM CHLORIDE 0.9 % IR SOLN
80.0000 mg | Status: AC
  Administered 2016-06-10: 80 mg

## 2016-06-10 MED ORDER — BRIMONIDINE TARTRATE-TIMOLOL 0.2-0.5 % OP SOLN
1.0000 [drp] | Freq: Two times a day (BID) | OPHTHALMIC | Status: DC
  Filled 2016-06-10: qty 5

## 2016-06-10 MED ORDER — MUPIROCIN 2 % EX OINT
1.0000 "application " | TOPICAL_OINTMENT | Freq: Once | CUTANEOUS | Status: AC
  Administered 2016-06-10: 1 via TOPICAL
  Filled 2016-06-10: qty 22

## 2016-06-10 MED ORDER — HEPARIN (PORCINE) IN NACL 2-0.9 UNIT/ML-% IJ SOLN
INTRAMUSCULAR | Status: AC
  Filled 2016-06-10: qty 1000

## 2016-06-10 MED ORDER — LIDOCAINE HCL (PF) 1 % IJ SOLN
INTRAMUSCULAR | Status: AC
  Filled 2016-06-10: qty 60

## 2016-06-10 MED ORDER — LISINOPRIL 10 MG PO TABS
10.0000 mg | ORAL_TABLET | Freq: Every day | ORAL | Status: DC
  Administered 2016-06-11: 10 mg via ORAL
  Filled 2016-06-10: qty 1

## 2016-06-10 MED ORDER — SODIUM CHLORIDE 0.9 % IV SOLN
INTRAVENOUS | Status: DC
  Administered 2016-06-10: 250 mL via INTRAVENOUS
  Administered 2016-06-10: 50 mL via INTRAVENOUS
  Administered 2016-06-10: 250 mL via INTRAVENOUS

## 2016-06-10 MED ORDER — CEFAZOLIN SODIUM-DEXTROSE 2-4 GM/100ML-% IV SOLN
INTRAVENOUS | Status: AC
  Filled 2016-06-10: qty 100

## 2016-06-10 MED ORDER — IOPAMIDOL (ISOVUE-370) INJECTION 76%
INTRAVENOUS | Status: AC
  Filled 2016-06-10: qty 100

## 2016-06-10 MED ORDER — SODIUM CHLORIDE 0.9 % WEIGHT BASED INFUSION
3.0000 mL/kg/h | INTRAVENOUS | Status: DC
  Administered 2016-06-10: 3 mL/kg/h via INTRAVENOUS

## 2016-06-10 MED ORDER — SODIUM CHLORIDE 0.9 % IV SOLN: INTRAVENOUS | Status: AC

## 2016-06-10 MED ORDER — CEFAZOLIN SODIUM-DEXTROSE 1-4 GM/50ML-% IV SOLN
1.0000 g | Freq: Four times a day (QID) | INTRAVENOUS | Status: AC
  Administered 2016-06-10 – 2016-06-11 (×3): 1 g via INTRAVENOUS
  Filled 2016-06-10 (×4): qty 50

## 2016-06-10 MED ORDER — CEFAZOLIN SODIUM-DEXTROSE 2-4 GM/100ML-% IV SOLN
2.0000 g | INTRAVENOUS | Status: AC
  Administered 2016-06-10: 2 g via INTRAVENOUS

## 2016-06-10 MED ORDER — HEPARIN SODIUM (PORCINE) 1000 UNIT/ML IJ SOLN
INTRAMUSCULAR | Status: DC | PRN
  Administered 2016-06-10: 3500 [IU] via INTRAVENOUS

## 2016-06-10 MED ORDER — IOPAMIDOL (ISOVUE-370) INJECTION 76%
INTRAVENOUS | Status: AC
  Filled 2016-06-10: qty 50

## 2016-06-10 MED ORDER — SODIUM CHLORIDE 0.9 % IR SOLN
Status: AC
  Filled 2016-06-10: qty 2

## 2016-06-10 MED ORDER — ATORVASTATIN CALCIUM 10 MG PO TABS
10.0000 mg | ORAL_TABLET | Freq: Every day | ORAL | Status: DC
  Administered 2016-06-10: 10 mg via ORAL
  Filled 2016-06-10: qty 1

## 2016-06-10 SURGICAL SUPPLY — 9 items
CABLE SURGICAL S-101-97-12 (CABLE) ×4 IMPLANT
IPG PACE AZUR XT DR MRI W1DR01 (Pacemaker) ×1 IMPLANT
LEAD CAPSURE NOVUS 5076-52CM (Lead) ×2 IMPLANT
LEAD CAPSURE NOVUS 5076-58CM (Lead) ×2 IMPLANT
PACE AZURE XT DR MRI W1DR01 (Pacemaker) ×2 IMPLANT
PAD DEFIB LIFELINK (PAD) ×2 IMPLANT
SHEATH CLASSIC 7F (SHEATH) ×4 IMPLANT
TRAY PACEMAKER INSERTION (PACKS) ×2 IMPLANT
WIRE HI TORQ VERSACORE-J 145CM (WIRE) ×2 IMPLANT

## 2016-06-10 SURGICAL SUPPLY — 10 items

## 2016-06-10 NOTE — Progress Notes (Signed)
TR BAND REMOVAL  LOCATION:    Radial  Rt wrist  DEFLATED PER PROTOCOL:   yes  TIME BAND OFF / DRESSING APPLIED:    1350, gauze and small tegaderm  SITE UPON ARRIVAL:    Level 0  SITE AFTER BAND REMOVAL:    Level 0, small faint bruise  CIRCULATION SENSATION AND MOVEMENT:    Within Normal Limits :  yes  COMMENTS:

## 2016-06-10 NOTE — Interval H&P Note (Signed)
History and Physical Interval Note:  06/10/2016 10:31 AM  Rosilyn MingsJames F Ashby  has presented today for surgery, with the diagnosis of bradycardia  The various methods of treatment have been discussed with the patient and family. After consideration of risks, benefits and other options for treatment, the patient has consented to  Procedure(s): Left Heart Cath and Coronary Angiography (N/A) and possible angioplasty as a surgical intervention .  The patient's history has been reviewed, patient examined, no change in status, stable for surgery.  I have reviewed the patient's chart and labs.  Questions were answered to the patient's satisfaction.     Bensimhon, Reuel Boomaniel

## 2016-06-10 NOTE — Interval H&P Note (Signed)
History and Physical Interval Note:  06/10/2016 12:21 PM  Travis [name]  has presented today for surgery, with the diagnosis of sinus node sysfunction  The various methods of treatment have been discussed with the patient and family. After consideration of risks, benefits and other options for treatment, the patient has consented to  Procedure(s): Pacemaker Implant (N/A) as a surgical intervention .  The patient's history has been reviewed, patient examined, no change in status, stable for surgery.  I have reviewed the patient's chart and labs.  Questions were answered to the patient's satisfaction.     Fredrika Canby

## 2016-06-10 NOTE — Op Note (Signed)
Procedure report  Procedure performed:  1. Implantation of new dual chamber permanent pacemaker 2. Fluoroscopy and left upper extremity venography 3. Light sedation    Reason for procedure: Symptomatic bradycardia due to: Sinus node dysfunction Sinus arrest  Procedure performed by: Thurmon FairMihai Samuel Mcpeek, MD  Complications: None  Estimated blood loss: <10 mL  Medications administered during procedure: Ancef 2 g intravenously Vancomycin 1 g intravenously Lidocaine 1% 30 mL locally,  Fentanyl 75 mcg intravenously Versed 3 mg intravenously Isovue 15 mL intravenouslyL  During this procedure the patient is administered a total of Versed 3 mg and Fentanyl 75 mcg to achieve and maintain moderate conscious sedation.  The patient's heart rate, blood pressure, and oxygen saturation are monitored continuously during the procedure. The period of conscious sedation is 78 minutes, of which I was present face-to-face 100% of this time.  Device details: Generator Medtronic Sequoia CrestAzure XT DR model [id number] serial number [id number] H Right atrial lead [id number] serial number [id number] Right ventricular lead [id number] serial number [id number]  Procedure details:  After the risks and benefits of the procedure were discussed the patient provided informed consent and was brought to the cardiac cath lab in the fasting state. The patient was prepped and draped in usual sterile fashion. Local anesthesia with 1% lidocaine was administered to to the left infraclavicular area. A 5-6 cm horizontal incision was made parallel with and 2-3 cm caudal to the left clavicle. Using electrocautery and blunt dissection a prepectoral pocket was created down to the level of the pectoralis major muscle fascia. The pocket was carefully inspected for hemostasis. An antibiotic-soaked sponge was placed in the pocket.  Under fluoroscopic guidance and using the modified Seldinger technique a single venipuncture was performed to access the  left subclavian vein. Some difficulty was encountered accessing the vein and a venogram was performed. Two J-tip guidewires were placed via the initial sheath, then subsequently exchanged for two 7 French safe sheaths.  Under fluoroscopic guidance the ventricular lead was advanced to level of the mid to apical right ventricular septum and thet active-fixation helix was deployed. Prominent current of injury was seen. Satisfactory pacing and sensing parameters were recorded. There was no evidence of diaphragmatic stimulation at maximum device output. The safe sheath was peeled away and the lead was secured in place with 2-0 silk.  In similar fashion the right atrial lead was advanced to the level of the atrial appendage. The active-fixation helix was deployed. There was prominent current of injury. Satisfactory  pacing and sensing parameters were recorded. There was no evidence of diaphragmatic stimulation with pacing at maximum device output. The safe sheath was peeled away and the lead was secured in place with 2-0 silk.  The antibiotic-soaked sponge was removed from the pocket. The pocket was flushed with copious amounts of antibiotic solution. Reinspection showed excellent hemostasis.  The ventricular lead was connected to the generator and appropriate ventricular pacing was seen. Subsequently the atrial lead was also connected. Repeat testing of the lead parameters later showed excellent values.  The entire system was then carefully inserted in the pocket with care been taking that the leads and device assumed a comfortable position without pressure on the incision. Great care was taken that the leads be located deep to the generator. The pocket was then closed in layers using 2 layers of 2-0 Vicryl and cutaneous staples, after which a sterile dressing was applied.  At the end of the procedure the following lead parameters were encountered:  Right atrial lead  sensed P  waves 3.6 mV, impedance 731  ohms, threshold 0.5 V at 0.5 ms pulse width.  Right ventricular lead sensed R waves 13.1 mV, impedance 1258 ohms, threshold 0.9 V at 0.5 ms pulse width.   Thurmon Fair, MD, [facility] [phone] office [phone] pager

## 2016-06-10 NOTE — H&P (View-Only) (Signed)
Cardiology Office Note:    Date:  06/04/2016   ID:  Travis [name], DOB March 18, 1940, MRN [id number]  PCP:  System, Pcp Not In  Cardiologist:  Thurmon Fair, MD    Referring MD: Deretha Emory, MD   Chief Complaint  Patient presents with  . Severe sinus bradycardia and sinus pauses     History of Present Illness:    Travis [name] is a 76 y.o. male with a hx of Hypertension and hyperlipidemia, without known coronary vascular problems to date, who has been found to have severe sinus bradycardia, periods of junctional escape rhythm and sinus pauses of up to 4.8 seconds in duration on an event monitor. He has significant resting bradycardia but normal heart rate response to physical activity. He has excellent functional status and enjoys running and spinning classes. He has not experienced syncope. The episodes of pauses were documented repeatedly and were not associated with vasovagal symptoms. He has never experienced syncope.  His treadmill stress test showed an abnormal ECG response to exercise with profound ST segment depression, but he did not experience angina and his nuclear perfusion images show minimal abnormalities. He is scheduled to go undergo an angiogram next Wednesday and I was asked by Dr. Tresa Endo to discuss pacemaker implantation on the same date.  Past Medical History:  Diagnosis Date  . Hyperlipidemia     No past surgical history on file.  Current Medications: Current Meds  Medication Sig  . aspirin EC 81 MG tablet Take 81 mg by mouth daily.  Marland Kitchen atorvastatin (LIPITOR) 10 MG tablet Take 10 mg by mouth daily.  . COMBIGAN 0.2-0.5 % ophthalmic solution   . lisinopril-hydrochlorothiazide (PRINZIDE,ZESTORETIC) 10-12.5 MG per tablet Take 1 tablet by mouth daily.  . metFORMIN (GLUCOPHAGE) 500 MG tablet Take by mouth 2 (two) times daily with a meal.  . sildenafil (VIAGRA) 100 MG tablet Take 100 mg by mouth daily as needed for erectile dysfunction.     Allergies:   Patient  has no known allergies.   Social History   Social History  . Marital status: Single    Spouse name: N/A  . Number of children: N/A  . Years of education: N/A   Social History Main Topics  . Smoking status: Never Smoker  . Smokeless tobacco: Never Used  . Alcohol use Yes  . Drug use: No  . Sexual activity: Not Asked   Other Topics Concern  . None   Social History Narrative  . None     Family History: The patient's family history includes Colon cancer in his father. ROS:   Please see the history of present illness.    All other systems reviewed and are negative.  EKGs/Labs/Other Studies Reviewed:    The following studies were reviewed today: Nuclear stress test, echo, 14 day event monitor  EKG:  EKG is  ordered today.  The ekg ordered today demonstrates supraventricular rhythm with unusual P-wave and very short PR interval suggestive of low atrial/junctional rhythm at about 40 bpm.  Recent Labs: 05/20/2016: Magnesium 1.8  Normal TSH 3.50  Physical Exam:    VS:  BP (!) 152/76   Pulse (!) 36   Ht 5\' 10"  (1.778 m)   Wt 167 lb (75.8 kg)   BMI 23.96 kg/m     Wt Readings from Last 3 Encounters:  06/04/16 167 lb (75.8 kg)  05/26/16 165 lb (74.8 kg)  05/20/16 165 lb (74.8 kg)     GEN:  Well nourished, well  developed in no acute distress HEENT: Normal NECK: No JVD; No carotid bruits LYMPHATICS: No lymphadenopathy CARDIAC: Bradycardia RRR, no diastolic murmurs, rubs, gallops. 1-2/6 aortic ejection murmur, early peaking. RESPIRATORY:  Clear to auscultation without rales, wheezing or rhonchi  ABDOMEN: Soft, non-tender, non-distended MUSCULOSKELETAL:  No edema; No deformity  SKIN: Warm and dry NEUROLOGIC:  Alert and oriented x 3 PSYCHIATRIC:  Normal affect   ASSESSMENT:    1. Bradycardia    PLAN:    In order of problems listed above:  1. Despite the absence of symptoms, Travis [name] has severe bradycardia with pauses of almost 5 seconds in duration. Even if  these are asymptomatic, he meets criteria for implantation of a dual-chamber permanent pacemaker for sinus node dysfunction. This procedure has been fully reviewed with the patient and informed consent has been obtained. Unless his cardiac catheterization shows an explanation for the abnormality (for example ostial critical right coronary artery stenosis), I think he should undergo implantation of a device. We reviewed the potential complications of bleeding this large one, need for reoperation, perforation and possible bleeding, pneumothorax and possible need for chest tube implantation, infection, etc. He is tentatively scheduled for cardiac catheterization and possible revascularization on Wednesday morning, pacemaker implantation that same afternoon.    Medication Adjustments/Labs and Tests Ordered: Current medicines are reviewed at length with the patient today.  Concerns regarding medicines are outlined above. Labs and tests ordered and medication changes are outlined in the patient instructions below:  Patient Instructions    [facility] [address] [place] [address] Dept: [phone] Loc: [phone]  Rosilyn MingsJames F Parr  06/04/2016  You are scheduled for a heart cath on Wednesday May 30th @ 9:00 am  1. Please [facility] at [facility]: [address] [place], [place] [address] at  (two hours before your procedure to ensure your preparation). Free valet parking service is available.   Special note: Every effort is made to have your procedure done on time. Please understand that emergencies sometimes delay scheduled procedures.  2. Diet: DO NOT EAT OR DRINK AFTER MIDNIGHT ON Tuesday MAY 29th  3. Labs:   4. Medication instructions in preparation for your procedure:   DO NOT TAKE THE LISINOPRIL THE MORNING OF THE PROCEDURE.    On the morning of your  procedure, take your  and any morning medicines NOT listed above.  You may use sips of water.  5. Plan for one night stay--bring personal belongings. 6. Bring a current list of your medications and current insurance cards. 7. You MUST have a responsible person to drive you home. 8. Someone MUST be with you the first 24 hours after you arrive home or your discharge will be delayed. 9. Please wear clothes that are easy to get on and off and wear slip-on shoes.  Thank you for allowing us to care for you!   -- [facility]    Signed, Thurmon FairMihai Nirel Babler, MD  06/04/2016 1:06 PM    [facility]

## 2016-06-11 ENCOUNTER — Encounter (HOSPITAL_COMMUNITY): Payer: Self-pay | Admitting: Cardiovascular Disease

## 2016-06-11 ENCOUNTER — Ambulatory Visit (HOSPITAL_COMMUNITY): Payer: Medicare Other

## 2016-06-11 DIAGNOSIS — I495 Sick sinus syndrome: Secondary | ICD-10-CM | POA: Diagnosis not present

## 2016-06-11 DIAGNOSIS — I10 Essential (primary) hypertension: Secondary | ICD-10-CM | POA: Diagnosis not present

## 2016-06-11 DIAGNOSIS — I455 Other specified heart block: Secondary | ICD-10-CM | POA: Diagnosis not present

## 2016-06-11 DIAGNOSIS — I2510 Atherosclerotic heart disease of native coronary artery without angina pectoris: Secondary | ICD-10-CM | POA: Diagnosis not present

## 2016-06-11 MED FILL — Sodium Chloride Irrigation Soln 0.9%: Qty: 500 | Status: AC

## 2016-06-11 MED FILL — Gentamicin Sulfate Inj 40 MG/ML: INTRAMUSCULAR | Qty: 2 | Status: AC

## 2016-06-11 NOTE — Progress Notes (Signed)
   Progress Note  Patient Name: Travis [name] Date of Encounter: 06/11/2016  Primary Cardiologist: Kelly/Monda Chastain  Subjective   Mild soreness at surgical site.  Inpatient Medications    Scheduled Meds: . aspirin EC  81 mg Oral Daily  . atorvastatin  10 mg Oral q1800  . brimonidine  1 drop Both Eyes BID  . hydrochlorothiazide  12.5 mg Oral Daily  . lisinopril  10 mg Oral Daily  . metFORMIN  500 mg Oral BID WC  . sodium chloride flush  3 mL Intravenous Q12H  . timolol  1 drop Both Eyes BID   Continuous Infusions: . sodium chloride    .  ceFAZolin (ANCEF) IV Stopped (06/11/16 0351)   PRN Meds: sodium chloride, acetaminophen, ondansetron (ZOFRAN) IV, sodium chloride flush   Vital Signs    Vitals:   06/10/16 2052 06/10/16 2351 06/11/16 0323 06/11/16 0635  BP: 134/71 105/66 (!) 166/79   Pulse: (!) 55 (!) 50 (!) 50   Resp: 17 11 17    Temp: 97.8 F (36.6 C) 97.3 F (36.3 C) 97.8 F (36.6 C)   TempSrc: Oral Oral Oral   SpO2: 97% 97% 100%   Weight:    161 lb 1.6 oz (73.1 kg)  Height:        Intake/Output Summary (Last 24 hours) at 06/11/16 0745 Last data filed at 06/11/16 0636  Gross per 24 hour  Intake              600 ml  Output             1600 ml  Net            -1000 ml   Filed Weights   06/10/16 0741 06/11/16 0635  Weight: 165 lb (74.8 kg) 161 lb 1.6 oz (73.1 kg)    Telemetry    A paced, V sensed - Personally Reviewed  ECG    A paced, V sensed - Personally Reviewed  Physical Exam  Smiling, comfortable Slightly blood-tinged dressing GEN: No acute distress.   Neck: No JVD Cardiac: RRR, no murmurs, rubs, or gallops.  Respiratory: Clear to auscultation bilaterally. GI: Soft, nontender, non-distended  MS: No edema; No deformity. Neuro:  Nonfocal  Psych: Normal affect   Radiology    No results found.  On my review - device and leads OK, no pneumothorax  Cardiac Studies   DEVICE CHECK P waves 4.4 mV, impedance 475 ohm, threshold 0.5@0 .4  ms R waves 19.8 mV, impedance 741 ohm, threshold 0.5V@0 .4 ms  Patient Profile     76 y.o. male 1 day after implantation of a dual chamber pacemaker for SSS with Pauses of up to 5-6 seconds  Assessment & Plan    Wound check, device interrogation and CXR all favorable. Wound care and activity restrictions given. Wound check appt for staple removal approx 10 days.  Office pacemaker visit 3 months. DC home today.  Signed, Thurmon FairMihai Shatana Saxton, MD  06/11/2016, 7:45 AM

## 2016-06-11 NOTE — Discharge Summary (Signed)
ELECTROPHYSIOLOGY PROCEDURE DISCHARGE SUMMARY    Patient ID: Travis MingsJames F Carvin,  MRN: [id number], DOB/AGE: 1940-07-12 76 y.o.  Admit date: 06/10/2016 Discharge date: 06/11/2016  Primary Care Physician: Lorenda IshiharaVaradarajan, Rupashree, MD  Primary Cardiologist Drs. Kelly/Croitoru   Primary Discharge Diagnosis:  1. Profound bradycardia 2. Abnormal stress test  Secondary Discharge Diagnosis:  1. HTN 2. HLD  No Known Allergies   Procedures This Admission:  1. LHC 06/10/16, Dr. Gala RomneyBensimhon 1. Moderate non-obstructive CAD 2. LAD lesion is 60-70% and highly calcified 3. EF 60-65% 4. Symptomatic bradycardia Plan/Discussion: Films reviewed with Dr. SwazilandJordan. LAD lesion does not appear obstructive. Patient denies exertional angina. Nuclear images reviewed personally and no evidence of ischemia.  Will treat CAD medically.  2.  Implantation of a MDT dual chamber PPM on 06/10/16 by Dr Royann Shiversroitoru.   See procedure report for details Generator Medtronic Azure XT DR model [id number] serial number [id number] H Right atrial lead [id number] serial number [id number] Right ventricular lead [id number] serial number [id number] There were no immediate post procedure complications. CXR on 06/11/16 demonstrated no pneumothorax status post device implantation.   Brief HPI: Travis [name] is a 76 y.o. male was referred for evaluation of PPM implant in the outpatient setting 2/2 severe bradycardia and pauses, he had an abnormal stress test and planned for cardiac cath as well.  He had c/o lightheadedness/dizziness, no syncope Past medical history includes HTN, HLD.  The patient has had symptomatic bradycardia without reversible causes identified.  Risks, benefits, and alternatives to PPM implantation were reviewed with the patient who wished to proceed.   Hospital Course:  The patient was admitted and underwent LHC with Dr. Gala RomneyBensimhon with results as above, the cath site is stable, patient denies any pain/numbness, there is no  swelling, bleeding, hematoma noted, activity restrictions were discussed with the patient, CAD noted was recommended for medical management, cath was followed by implantation of a PPM with details as outlined above. He was monitored on telemetry overnight which demonstrated A paced/Vsensed.  Left chest was without hematoma or ecchymosis.  The device was interrogated and found to be functioning normally.  CXR was obtained and demonstrated no pneumothorax status post device implantation.  Post pacemaker wound care, arm mobility, and restrictions were reviewed with the patient by Dr. Royann Shiversroitoru.  The patient was examined by Dr. Royann Shiversroitoru and considered stable for discharge to home. Follow up with Dr. Tresa EndoKelly as well as routine post pacer implant wound check and f/u visit have been arranged.   Physical Exam: Vitals:   06/10/16 2351 06/11/16 0323 06/11/16 0635 06/11/16 0747  BP: 105/66 (!) 166/79  (!) 164/76  Pulse: (!) 50 (!) 50  (!) 53  Resp: 11 17  18   Temp: 97.3 F (36.3 C) 97.8 F (36.6 C)  97.7 F (36.5 C)  TempSrc: Oral Oral  Oral  SpO2: 97% 100%  100%  Weight:   161 lb 1.6 oz (73.1 kg)   Height:         Labs:   Lab Results  Component Value Date   WBC 7.1 06/04/2016   HCT 41.9 06/04/2016   MCV 96 06/04/2016   PLT 260 06/04/2016   No results for input(s): NA, K, CL, CO2, BUN, CREATININE, CALCIUM, PROT, BILITOT, ALKPHOS, ALT, AST, GLUCOSE in the last 168 hours.  Invalid input(s): LABALBU  Discharge Medications:  Allergies as of 06/11/2016   No Known Allergies     Medication List    TAKE these medications   aspirin EC 81  MG tablet Take 81 mg by mouth daily.   atorvastatin 10 MG tablet Commonly known as:  LIPITOR Take 10 mg by mouth daily.   COMBIGAN 0.2-0.5 % ophthalmic solution Generic drug:  brimonidine-timolol Place 1 drop into both eyes 2 (two) times daily.   lisinopril-hydrochlorothiazide 10-12.5 MG tablet Commonly known as:  PRINZIDE,ZESTORETIC Take 1 tablet by  mouth daily.   metFORMIN 500 MG tablet Commonly known as:  GLUCOPHAGE Take 500 mg by mouth 2 (two) times daily with a meal. Notes to patient:  Do not take for 48 hours    Vitamin D3 2000 units capsule Take 2,000 Units by mouth daily.       Disposition: Home Discharge Instructions    Diet - low sodium heart healthy    Complete by:  As directed    Increase activity slowly    Complete by:  As directed      Follow-up Information    [facility] Follow up on 06/25/2016.   Specialty:  Cardiology Why:  2:00PM, wound check Contact information: [address] [place] [address] [phone]       Thurmon Fair, MD Follow up on 09/15/2016.   Specialty:  Cardiology Why:  9:40AM Contact information: [address] [place] [address] [phone]        Lennette Bihari, MD Follow up on 06/16/2016.   Specialty:  Cardiology Why:  8:20AM Contact information: [address] [place] [address] [phone]           Duration of Discharge Encounter: Greater than 30 minutes including physician time.  Norma Fredrickson, PA-C 06/11/2016 11:28 AM

## 2016-06-11 NOTE — Progress Notes (Signed)
AVS given to and explained to patient and wife. All questions answered in full. Pt has all belongings with him at discharge to home.

## 2016-06-11 NOTE — Discharge Instructions (Signed)
Supplemental Discharge Instructions for  Pacemaker/Defibrillator Patients  Activity Do not raise your left/right arm above shoulder level or extend it backward beyond shoulder level for 2 weeks. Wear the arm sling as a reminder or as needed for comfort for 2 weeks. No heavy lifting or vigorous activity with your left/right arm for 6-8 weeks.    NO DRIVING is preferable for 2 weeks; If absolutely necessary, drive only short, familiar routes. DO wear your seatbelt, even if it crosses over the pacemaker site.  WOUND CARE - Keep the wound area clean and dry.  Remove the dressing the day after you return home (usually 48 hours after the procedure). - DO NOT SUBMERGE UNDER WATER UNTIL FULLY HEALED (no tub baths, hot tubs, swimming pools, etc.).  - You  may shower or take a sponge bath after the dressing is removed. DO NOT SOAK the area and do not allow the shower to directly spray on the site. - If you have staples, these will be removed in the office in 7-14 days. - If you have tape/steri-strips on your wound, these will fall off; do not pull them off prematurely.   - No bandage is needed on the site.  DO  NOT apply any creams, oils, or ointments to the wound area. - If you notice any drainage or discharge from the wound, any swelling, excessive redness or bruising at the site, or if you develop a fever > 101? F after you are discharged home, call the office at once.  Special Instructions - You are still able to use cellular telephones.  Avoid carrying your cellular phone near your device. - When traveling through airports, show security personnel your identification card to avoid being screened in the metal detectors.  - Avoid arc welding equipment, MRI testing (magnetic resonance imaging), TENS units (transcutaneous nerve stimulators).  Call the office for questions about other devices. - Avoid electrical appliances that are in poor condition or are not properly grounded. - Microwave ovens are  safe to be near or to operate.    Catheter procedure site instructions: Follow activity restrictions above.  Keep procedure site clean & dry. If you notice increased pain, swelling, bleeding or pus, call/return!

## 2016-06-16 ENCOUNTER — Encounter: Payer: Self-pay | Admitting: Cardiovascular Disease

## 2016-06-16 ENCOUNTER — Ambulatory Visit (INDEPENDENT_AMBULATORY_CARE_PROVIDER_SITE_OTHER): Payer: Medicare Other | Admitting: Cardiovascular Disease

## 2016-06-16 VITALS — BP 148/60 | HR 50 | Ht 70.0 in | Wt 161.0 lb

## 2016-06-16 DIAGNOSIS — Z79899 Other long term (current) drug therapy: Secondary | ICD-10-CM

## 2016-06-16 DIAGNOSIS — I495 Sick sinus syndrome: Secondary | ICD-10-CM

## 2016-06-16 DIAGNOSIS — I10 Essential (primary) hypertension: Secondary | ICD-10-CM | POA: Diagnosis not present

## 2016-06-16 DIAGNOSIS — E119 Type 2 diabetes mellitus without complications: Secondary | ICD-10-CM

## 2016-06-16 DIAGNOSIS — I2510 Atherosclerotic heart disease of native coronary artery without angina pectoris: Secondary | ICD-10-CM | POA: Diagnosis not present

## 2016-06-16 DIAGNOSIS — R9439 Abnormal result of other cardiovascular function study: Secondary | ICD-10-CM

## 2016-06-16 MED ORDER — LISINOPRIL 10 MG PO TABS: 10.0000 mg | ORAL_TABLET | Freq: Every day | ORAL | 1 refills | Status: DC

## 2016-06-16 MED ORDER — AMLODIPINE BESYLATE 5 MG PO TABS: 5.0000 mg | ORAL_TABLET | Freq: Every day | ORAL | 1 refills | Status: DC

## 2016-06-16 MED ORDER — ATORVASTATIN CALCIUM 40 MG PO TABS: 40.0000 mg | ORAL_TABLET | Freq: Every day | ORAL | 1 refills | Status: DC

## 2016-06-16 NOTE — Patient Instructions (Signed)
Medication Instructions:   STOP lisinopril-HCTZ START lisinopril 10mg  DAILY  START amlodipine 5mg  DAILY  INCREASE atorvastatin from 10mg  to 40mg  DAILY  Labwork:   CMET, Lipid profile (fasting labs) in 3 months  Testing/Procedures:  none  Follow-Up:  In 4 months with Dr. Tresa EndoKelly   If you need a refill on your cardiac medications before your next appointment, please call your pharmacy.

## 2016-06-16 NOTE — Progress Notes (Addendum)
Cardiology Office Note    Date:  06/18/2016   ID:  DEKLIN BIELER, DOB [birth year], MRN [id number]  PCP:  Lorenda Ishihara, MD  Referring M.D.: Dr. Lorenda Ishihara  Cardiologist:  Nicki Guadalajara, MD   Chief Complaint  Patient presents with  . Follow-up    Post hospital/echo and stress myoview. Monitor.    History of Present Illness:  Travis [name] is a 76 y.o. male who was initially referred through the courtesy of Dr.Varadarajan for evaluation of lightheadedness and dizziness while running.  Travis [name] has been active for his entire life.  He has a history of hypertension, hyperlipidemia, and has been diagnosed with type 2 diabetes mellitus for 14 years.  He exercises at least 6 days per week for 1-2 hours.  He does resistance training, spin classes, and typically runs anywhere from 5-6 miles at a 9-10 minute mile pace and has been doing so for many years.  His pulse typically runs in the low 30s.  Recently, he has begun to notice that he becomes dizzy when he starts to run.  This is associated with mild shortness of breath.  He denies any chest pain.  The symptoms have occurred since January.  He denies any frank syncope.  He denies the lightheaded symptoms when he is at rest and his symptoms only occur with significant activity.  When he does spin class he is unaware of any  symptoms.    When I saw for initial evaluation, his ECG revealed marked sinus bradycardia with ventricular rate averaging 37 bpm.  PR interval was 190 ms and QTc interval was 335 ms.  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  He had a 2/6 systolic murmur on physical exam, suggestive of possible aortic valve disease.  I scheduled him for a 2-D echo Doppler study which was done on 06/02/2016.  This showed an ejection fraction at 55-65%.  There was mild LVH.  He did not have any regional wall motion abnormalities.  His aortic valve was reported as  structurally normal.  There was a mean gradient of 9 and a peak gradient of 17.  There was mild MR and mild LA dilation.  His right atrium was mild moderately dilated.  I obtain a magnesium level, which was normal.  I scheduled him for an exercise nuclear stress test to assess both chronotropic competence to exercise as well as myocardial perfusion.  On his exercise portion.  His resting heart rate was 34 bpm, which rose to a maximum of 126/m.  He completed 2 minutes and 2 seconds into stage III of Bruce protocol.  He was able to augment his heart rate appropriately with exercise.  At the end of stage I reflective of good aerobic conditioning ventricular rate was 71, at the end of stage II 100, and in stage III maximum heart rate was 125.  He was entirely asymptomatic but had an exaggerated blood pressure response opted to 16/75 and at peak stress developed asymptomatic ST segment depression up to 3 mm in the inferior and inferolateral leads.  There was a very small mild distal anteroseptal defect which most likely was artifact.  There was no ischemia identified.  He has been wearing an event monitor.  We have been getting daily notifications demonstrating marked bradycardia with heart rates in the 30s.  These have been sinus rhythm.  He has been entirely asymptomatic.  He had developed sinus pauses up  to 5 - 6 seconds.  He remained entirely asymptomatic and specifically denied any dizziness or lightheadedness or chest pain.  He is unaware of sleep apnea but he does snore.    When I saw him in follow-up on 06/04/2016.  I had extensive discussion with him, both concerning his ST segment changes on ECG during stress testing as well as his need for a permanent pacemaker.  Due to scheduling difficulties and since he remained asymptomatic, ultimately, both of these procedures were done on 06/10/2016.  His diagnostic catheterization was done by Dr. Sampson Goon since I was unable to be in the hospital that day.  I reviewed  the angiographic findings.  He had normal LV function.  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  There was mild 30 and 40% smooth circumflex narrowings.  Later that same day in the afternoon.  He underwent successful insertion of a permanent pacemaker by Dr. 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  He tolerated both procedures well.  Since hospital discharge, he has felt improved.  He denies any anginal type symptoms.  He notes more energy and is not fatigued.  He presents for follow-up evaluation and initiation of anti-ischemic therapy.    Past Medical History:  Diagnosis Date  . Heart murmur    "an innocent one" (06/10/2016)  . History of blood transfusion ~ 1956   "while I was in hospital; don't remember why"  . History of kidney stones   . Hyperlipidemia   . Hypertension   . 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 SSS (sick sinus syndrome) (HCC) 06/10/2016  . Type II diabetes mellitus (HCC)     Past Surgical History:  Procedure Laterality Date  . APPENDECTOMY    . CARDIAC CATHETERIZATION  06/10/2016  . CATARACT EXTRACTION W/ INTRAOCULAR LENS IMPLANT Right   . CYSTOSCOPY W/ STONE MANIPULATION    . INSERT / REPLACE / REMOVE PACEMAKER  06/10/2016   dual chamber permanent pacemaker  . KNEE ARTHROSCOPY W/ MENISCECTOMY Bilateral   . LAPAROSCOPIC CHOLECYSTECTOMY    . LEFT HEART CATH AND CORONARY ANGIOGRAPHY N/A 06/10/2016   Procedure: Left Heart Cath and Coronary Angiography;  Surgeon: Dolores Patty, MD;  Location: [facility] INVASIVE CV LAB;  Service: Cardiovascular;  Laterality: N/A;  . LITHOTRIPSY    . PACEMAKER IMPLANT N/A 06/10/2016   Procedure: Pacemaker Implant;  Surgeon: Thurmon Fair, MD;  Location: [facility] INVASIVE CV LAB;  Service: Cardiovascular;  Laterality: N/A;  . TONSILLECTOMY      Current Medications: Outpatient  Medications Prior to Visit  Medication Sig Dispense Refill  . aspirin EC 81 MG tablet Take 81 mg by mouth daily.    . Cholecalciferol (VITAMIN D3) 2000 units capsule Take 2,000 Units by mouth daily.    . COMBIGAN 0.2-0.5 % ophthalmic solution Place 1 drop into both eyes 2 (two) times daily.     . metFORMIN (GLUCOPHAGE) 500 MG tablet Take 500 mg by mouth 2 (two) times daily with a meal.     . atorvastatin (LIPITOR) 10 MG tablet Take 40 mg by mouth daily.    Marland Kitchen lisinopril-hydrochlorothiazide (PRINZIDE,ZESTORETIC) 10-12.5 MG per tablet Take 1 tablet by mouth daily.     No facility-administered medications prior to visit.      Allergies:   Patient has no known allergies.   Social History   Social History  .  Marital status: Married    Spouse name: N/A  . Number of children: N/A  . Years of education: N/A   Social History Main Topics  . Smoking status: Former Smoker    Packs/day: 0.50    Years: 25.00    Types: Cigarettes    Quit date: 29  . Smokeless tobacco: Never Used  . Alcohol use Yes     Comment: 06/10/2016 "might have 2 beers/month"  . Drug use: No  . Sexual activity: Not Asked   Other Topics Concern  . None   Social History Narrative  . None    Social history is notable in that he was born in [place].  He's been married for 54 years.  He has 2 children, 7 grandchildren, and one great-grandchild.  He has a Event organiser in Engineer, technical sales.  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  He drinks occasional beer.  He does not smoke.   Family History:  The patient's family history includes Colon cancer in his father..  He has a brother age 49 and a sister age 68, who are healthy.   ROS General: Negative; No fevers, chills, or night sweats;  HEENT: Negative; No changes in vision or hearing, sinus congestion, difficulty swallowing Pulmonary: Negative; No cough, wheezing, shortness of breath, hemoptysis Cardiovascular:  See  HPI GI: Negative; No nausea, vomiting, diarrhea, or abdominal pain GU: Negative; No dysuria, hematuria, or difficulty voiding Musculoskeletal: Negative; no myalgias, joint pain, or weakness Hematologic/Oncology: Negative; no easy bruising, bleeding Endocrine: Negative; no heat/cold intolerance; no diabetes Neuro: Negative; no changes in balance, headaches Skin: Negative; No rashes or skin lesions Psychiatric: Negative; No behavioral problems, depression Sleep: Positive for snoring, no daytime sleepiness, hypersomnolence, bruxism, restless legs, hypnogognic hallucinations, no cataplexy Other comprehensive 14 point system review is negative.   PHYSICAL EXAM:   VS:  BP (!) 148/60   Pulse (!) 50   Ht 5\' 10"  (1.778 m)   Wt 161 lb (73 kg)   BMI 23.10 kg/m     Repeat blood pressure was 140/80.  Wt Readings from Last 3 Encounters:  06/16/16 161 lb (73 kg)  06/11/16 161 lb 1.6 oz (73.1 kg)  06/04/16 167 lb (75.8 kg)    General: Alert, oriented, no distress.  Skin: normal turgor, no rashes, warm and dry HEENT: Normocephalic, atraumatic. Pupils equal round and reactive to light; sclera anicteric; extraocular muscles intact;  Nose without nasal septal hypertrophy Mouth/Parynx benign; Mallinpatti scale2 Neck: No JVD, no carotid bruits; normal carotid upstroke Lungs: clear to ausculatation and percussion; no wheezing or rales Chest wall: without tenderness to palpitation; Pacer site without erythema or induration; metal sutures in place Heart: PMI not displaced, RRR, s1 s2 normal, 1/6 systolic murmur, no diastolic murmur, no rubs, gallops, thrills, or heaves Abdomen: soft, nontender; no hepatosplenomehaly, BS+; abdominal aorta nontender and not dilated by palpation. Back: no CVA tenderness Pulses 2+ Musculoskeletal: full range of motion, normal strength, no joint deformities Extremities: no clubbing cyanosis or edema, Homan's sign negative  Neurologic: grossly nonfocal; Cranial nerves  grossly wnl Psychologic: Normal mood and affect    Studies/Labs Reviewed:   EKG:  EKG is ordered today.  ECG (independently read by me): Atrially paced rhythm at 50 bpm with prolonged AV conduction with a PR interval at 214 ms.  06/04/2016 ECG (independently read by me): Marked sinus bradycardia with possible low atrial involvement.  PR interval 166 ms, QTc interval 335 ms.  Ventricular rate 36.  05/20/2016 ECG (independently read by me): Marked sinus bradycardia with sinus arrhythmia, ventricular rate in the 40s to upper 30s, average 37.  PR interval 190 ms.  QTc interval 335 ms.  No ST segment changes.  Recent Labs: BMP Latest Ref Rng & Units 06/04/2016  Glucose 65 - 99 mg/dL 161(W)  BUN 8 - 27 mg/dL 24  Creatinine 9.60 - 4.54 mg/dL 0.98  BUN/Creat Ratio 10 - 24 21  Sodium 134 - 144 mmol/L 138  Potassium 3.5 - 5.2 mmol/L 4.7  Chloride 96 - 106 mmol/L 104  CO2 18 - 29 mmol/L 20  Calcium 8.6 - 10.2 mg/dL 9.5     No flowsheet data found.  CBC Latest Ref Rng & Units 06/04/2016  WBC 3.4 - 10.8 x10E3/uL 7.1  Hemoglobin 13.0 - 17.7 g/dL 11.9  Hematocrit 14.7 - 51.0 % 41.9  Platelets 150 - 379 x10E3/uL 260   Lab Results  Component Value Date   MCV 96 06/04/2016   No results found for: TSH No results found for: HGBA1C   BNP No results found for: BNP  ProBNP No results found for: PROBNP   Lipid Panel  No results found for: CHOL, TRIG, HDL, CHOLHDL, VLDL, LDLCALC, LDLDIRECT   RADIOLOGY: Dg Chest 2 View  Result Date: 06/11/2016 CLINICAL DATA:  Status post pacemaker placement EXAM: CHEST  2 VIEW COMPARISON:  06/04/2016 FINDINGS: No pneumothorax is noted. A pacing device is now seen in satisfactory position. Cardiac shadow is stable. Hiatal hernia is again seen. No focal infiltrate is noted. Degenerative changes of the thoracic spine are seen. IMPRESSION: No pneumothorax following pacemaker placement. Electronically Signed   By: Alcide Clever M.D.   On: 06/11/2016 07:55    Dg Chest 2 View  Result Date: 06/04/2016 CLINICAL DATA:  Preoperative examination for heart catheterization and pacemaker placement. EXAM: CHEST  2 VIEW COMPARISON:  03/28/2004 chest CT. FINDINGS: Cardiomegaly again identified. An electronic device overlying the left chest is noted. A moderate to large hiatal hernia is noted. There is no evidence of focal airspace disease, pulmonary edema, suspicious pulmonary nodule/mass, pleural effusion, or pneumothorax. No acute bony abnormalities are identified. IMPRESSION: No evidence of acute cardiopulmonary disease. Cardiomegaly and moderate to large hiatal hernia. Electronically Signed   By: Harmon Pier M.D.   On: 06/04/2016 16:42     Additional studies/ records that were reviewed today include:  I reviewed the recent records from [place]. I have reviewed his 2-D echo Doppler study, exercise Myoview study, laboratory, as well as daily  Monitors with critical notification from the event monitor prior to the insertion of the pacemaker.  I personally reviewed the catheterization findings, as well as his pacemaker implantation report.    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 Essential hypertension   3. Abnormal stress test   4. Sick sinus syndrome (HCC)   5. Encounter for long-term (current) use of medications   6. Type 2 diabetes mellitus without complication, without long-term current use of insulin [facility])      PLAN:  Travis [name] is a very pleasant, active and healthy-appearing 76 year old gentleman who has a long-standing history of significant exercise and has been exercising for at least 1-2 hours per day 6 days per week.  He often runs 5-6 miles at a time and does spin class.  Typically, as result of his aerobic conditioning, he has had long-standing history of sinus bradycardia.  Recently, he had begun to notice  dizziness during the early portion of his run.  He denied the symptoms when he is doing spin class or resistance training.   A 2-D echo Doppler study revealed mild LVH with normal systolic function with an EF of 55-60%.  He did not have significant valvular aortic stenosis in the was mention of normal excursion, although a mild gradient was noted.  He has demonstrated to have chronotropic competence and was able to increase his heart rate to 125 bpm.  During stage III of the standard Bruce protocol he developed asymptomatic ST segment depression concerning for potential ischemia with up to 3 mm apart resolved inferolateral ST segment changes.  However , there appeared to be fairly normal perfusion with probable mild apical defect, more consistent with artifact rather than scar or ischemia.  He continued to have marked bradycardia and on monitoring was demonstrated to have marked sinus bradycardia with pauses occurring during sleep and in the early morning for which again he remains entirely asymptomatic.  With his marked ECG changes even though perfusion appears normal, I recommended definitive cardiac catheterization.  This revealed a focal fibrotic appearing mildly calcified 65% mid LAD stenosis with 70% stenosis in the diagonal vessel, and mild 30 and 40% circumflex narrowings.  These may have contributed to his ST segment changes; however, he did not have significant ischemia noted on perfusion imaging.  His blood pressure today was 148/60.  He does not have any signs of volume overload or edema.  As result, I have recommended he discontinue lisinopril HCT T disease and in its place.  Initiate lisinopril at just 10 mg daily.  I'm adding amlodipine 5 mg, which will provide additional blood pressure support as well as anti-ischemic benefit.  With his documented CAD at catheterization, I am further titrating atorvastatin from 10 mg up to 40 mg daily.  He feels improved and has more energy since his pacemaker was implanted and his site is clean without bleeding.  He will return to the pacemaker clinic next week for his suture removal.  He  will see Dr. Royann Shivers for pacemaker interrogation and threshold adjustment after 3 months following implantation.  He continues to be on metformin for his diabetes mellitus.  In 3 months, I am repeating a chemistry profile, lipid panel to assess his medication changes and I will see him in the office for follow-up evaluation.  Medication Adjustments/Labs and Tests Ordered: Current medicines are reviewed at length with the patient today.  Concerns regarding medicines are outlined above.  Medication changes, Labs and Tests ordered today are listed in the Patient Instructions below. Patient Instructions  Medication Instructions:   STOP lisinopril-HCTZ START lisinopril 10mg  DAILY  START amlodipine 5mg  DAILY  INCREASE atorvastatin from 10mg  to 40mg  DAILY  Labwork:   CMET, Lipid profile (fasting labs) in 3 months  Testing/Procedures:  none  Follow-Up:  In 4 months with Dr. Tresa Endo   If you need a refill on your cardiac medications before your next appointment, please call your pharmacy.      Signed, Nicki Guadalajara, MD  06/18/2016 10:09 PM    [name] [address], [place], [place]  [address] Phone: [phone]      Cardiology Office Note    Date:  06/18/2016   ID:  DEX BLAKELY, DOB 1940/08/18, MRN [id number]  PCP:  Lorenda Ishihara, MD  Referring M.D.: Dr. Lorenda Ishihara  Cardiologist:  Nicki Guadalajara, MD   Chief Complaint  Patient presents with  .  Follow-up    Post hospital/echo and stress myoview. Monitor.    History of Present Illness:  Travis [name] is a 76 y.o. male who is referred through the courtesy of Dr.Varadarajan for evaluation of lightheadedness and dizziness while running.  Mr. Katrinka [name] has been active for his entire life.  He has a history of hypertension, hyperlipidemia, and has been diagnosed with type 2 diabetes mellitus for 14 years.  He exercises at least 6 days per week for 1-2 hours.  He does  resistance training, spin classes, and typically runs anywhere from 5-6 miles at a 9-10 minute mile pace and has been doing so for many years.  His pulse typically runs in the low 30s.  Recently, he has begun to notice that he becomes dizzy when he starts to run.  This is associated with mild shortness of breath.  He denies any chest pain.  The symptoms have occurred since January.  He denies any frank syncope.  He denies the lightheaded symptoms when he is at rest and his symptoms only occur with significant activity.  When he does spin class he is unaware of any  symptoms.    When I saw for initial evaluation, his ECG revealed marked sinus bradycardia with ventricular rate averaging 37 bpm.  PR interval was 190 ms and QTc interval was 335 ms.  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  He had a 2/6 systolic murmur on physical exam, suggestive of possible aortic valve disease.  I scheduled him for a 2-D echo Doppler study which was done on 06/02/2016.  This showed an ejection fraction at 55-65%.  There was mild LVH.  He did not have any regional wall motion abnormalities.  His aortic valve was reported as structurally normal.  There was a mean gradient of 9 and a peak gradient of 17.  There was mild MR and mild LA dilation.  His right atrium was mild moderately dilated.  I obtain a magnesium level, which was normal.  I scheduled him for an exercise nuclear stress test to assess both chronotropic competence to exercise as well as myocardial perfusion.  On his exercise portion.  His resting heart rate was 34 bpm, which rose to a maximum of 126/m.  He completed 2 minutes and 2 seconds into stage III of Bruce protocol.  He was able to augment his heart rate appropriately with exercise.  At the end of stage I reflective of good aerobic conditioning ventricular rate was 71, at the end of stage II 100, and in stage III maximum heart rate was 125.  He  was entirely asymptomatic but had an exaggerated blood pressure response opted to 16/75 and at peak stress developed asymptomatic ST segment depression up to 3 mm in the inferior and inferolateral leads.  There was a very small mild distal anteroseptal defect which most likely was artifact.  There was no ischemia identified.  He has been wearing an event monitor.  We have been getting daily notifications demonstrating marked bradycardia with heart rates in the 30s.  These have been sinus rhythm.  He has been entirely asymptomatic.  He has developed sinus positives up to proximally 5 seconds.  On 06/03/2016 at 4:57 AM 5.4 second pause was noted.  This a.m., after awakening at 6:59 AM a 4.8 second pause was noted.  He again remains entirely asymptomatic and specifically denies any dizziness or lightheadedness.  He denies chest pain.  He  is unaware of sleep apnea but he does snore.  He resents for follow-up cardiology evaluation.   Past Medical History:  Diagnosis Date  . Heart murmur    "an innocent one" (06/10/2016)  . History of blood transfusion ~ 1956   "while I was in hospital; don't remember why"  . History of kidney stones   . Hyperlipidemia   . Hypertension   . 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 SSS (sick sinus syndrome) (HCC) 06/10/2016  . Type II diabetes mellitus (HCC)     Past Surgical History:  Procedure Laterality Date  . APPENDECTOMY    . CARDIAC CATHETERIZATION  06/10/2016  . CATARACT EXTRACTION W/ INTRAOCULAR LENS IMPLANT Right   . CYSTOSCOPY W/ STONE MANIPULATION    . INSERT / REPLACE / REMOVE PACEMAKER  06/10/2016   dual chamber permanent pacemaker  . KNEE ARTHROSCOPY W/ MENISCECTOMY Bilateral   . LAPAROSCOPIC CHOLECYSTECTOMY    . LEFT HEART CATH AND CORONARY ANGIOGRAPHY N/A 06/10/2016   Procedure: Left Heart Cath and Coronary Angiography;  Surgeon: Dolores Patty, MD;  Location: [facility] INVASIVE CV LAB;  Service: Cardiovascular;  Laterality: N/A;  .  LITHOTRIPSY    . PACEMAKER IMPLANT N/A 06/10/2016   Procedure: Pacemaker Implant;  Surgeon: Thurmon Fair, MD;  Location: [facility] INVASIVE CV LAB;  Service: Cardiovascular;  Laterality: N/A;  . TONSILLECTOMY      Current Medications: Outpatient Medications Prior to Visit  Medication Sig Dispense Refill  . aspirin EC 81 MG tablet Take 81 mg by mouth daily.    . Cholecalciferol (VITAMIN D3) 2000 units capsule Take 2,000 Units by mouth daily.    . COMBIGAN 0.2-0.5 % ophthalmic solution Place 1 drop into both eyes 2 (two) times daily.     . metFORMIN (GLUCOPHAGE) 500 MG tablet Take 500 mg by mouth 2 (two) times daily with a meal.     . atorvastatin (LIPITOR) 10 MG tablet Take 40 mg by mouth daily.    Marland Kitchen lisinopril-hydrochlorothiazide (PRINZIDE,ZESTORETIC) 10-12.5 MG per tablet Take 1 tablet by mouth daily.     No facility-administered medications prior to visit.      Allergies:   Patient has no known allergies.   Social History   Social History  . Marital status: Married    Spouse name: N/A  . Number of children: N/A  . Years of education: N/A   Social History Main Topics  . Smoking status: Former Smoker    Packs/day: 0.50    Years: 25.00    Types: Cigarettes    Quit date: 16  . Smokeless tobacco: Never Used  . Alcohol use Yes     Comment: 06/10/2016 "might have 2 beers/month"  . Drug use: No  . Sexual activity: Not Asked   Other Topics Concern  . None   Social History Narrative  . None    Social history is notable in that he was born in [place].  He's been married for 54 years.  He has 2 children, 7 grandchildren, and one great-grandchild.  He has a Event organiser in Engineer, technical sales.  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  He drinks occasional beer.  He does not smoke.   Family History:  The patient's family history includes Colon cancer in his father..  He has a brother age 63 and a sister age 67, who are healthy.    ROS General: Negative; No fevers, chills, or night sweats;  HEENT: Negative; No changes in vision or hearing, sinus congestion, difficulty swallowing Pulmonary: Negative; No cough, wheezing, shortness of breath, hemoptysis Cardiovascular:  See HPI GI: Negative; No nausea, vomiting, diarrhea, or abdominal pain GU: Negative; No dysuria, hematuria, or difficulty voiding Musculoskeletal: Negative; no myalgias, joint pain, or weakness Hematologic/Oncology: Negative; no easy bruising, bleeding Endocrine: Negative; no heat/cold intolerance; no diabetes Neuro: Negative; no changes in balance, headaches Skin: Negative; No rashes or skin lesions Psychiatric: Negative; No behavioral problems, depression Sleep: Positive for snoring, no daytime sleepiness, hypersomnolence, bruxism, restless legs, hypnogognic hallucinations, no cataplexy Other comprehensive 14 point system review is negative.   PHYSICAL EXAM:   VS:  BP (!) 148/60   Pulse (!) 50   Ht 5\' 10"  (1.778 m)   Wt 161 lb (73 kg)   BMI 23.10 kg/m     Repeat blood pressure was 142/80.  He was entirely asymptomatic. Wt Readings from Last 3 Encounters:  06/16/16 161 lb (73 kg)  06/11/16 161 lb 1.6 oz (73.1 kg)  06/04/16 167 lb (75.8 kg)    General: Alert, oriented, no distress.  Skin: normal turgor, no rashes, warm and dry HEENT: Normocephalic, atraumatic. Pupils equal round and reactive to light; sclera anicteric; extraocular muscles intact; Fundi Normal Nose without nasal septal hypertrophy Mouth/Parynx benign; Mallinpatti scale 2 Neck: No JVD, no carotid bruits; normal carotid upstroke Lungs: clear to ausculatation and percussion; no wheezing or rales Chest wall: without tenderness to palpitation Heart: PMI not displaced, bradycardic with heart rate in the upper 30s-40s, s1 s2 normal, 2/6 systolic murmur in the aortic area, no diastolic murmur, no rubs, gallops, thrills, or heaves Abdomen: soft, nontender; no hepatosplenomehaly,  BS+; abdominal aorta nontender and not dilated by palpation. Back: no CVA tenderness Pulses 2+ Musculoskeletal: full range of motion, normal strength, no joint deformities Extremities: no clubbing cyanosis or edema, Homan's sign negative  Neurologic: grossly nonfocal; Cranial nerves grossly wnl Psychologic: Normal mood and affect   Studies/Labs Reviewed:   EKG:  EKG is ordered today.  ECG (independently read by me): Atrial paced rhythm at 50 with ventricular sensing.  AV conduction 214 ms.  QTc interval 371 ms.  No skin ST-T changes.  06/04/2016 ECG (independently read by me): Marked sinus bradycardia with possible low atrial involvement.  PR interval 166 ms, QTc interval 335 ms.  Ventricular rate 36.  05/20/2016 ECG (independently read by me): Marked sinus bradycardia with sinus arrhythmia, ventricular rate in the 40s to upper 30s, average 37.  PR interval 190 ms.  QTc interval 335 ms.  No ST segment changes.  Recent Labs: BMP Latest Ref Rng & Units 06/04/2016  Glucose 65 - 99 mg/dL 161(W)  BUN 8 - 27 mg/dL 24  Creatinine 9.60 - 4.54 mg/dL 0.98  BUN/Creat Ratio 10 - 24 21  Sodium 134 - 144 mmol/L 138  Potassium 3.5 - 5.2 mmol/L 4.7  Chloride 96 - 106 mmol/L 104  CO2 18 - 29 mmol/L 20  Calcium 8.6 - 10.2 mg/dL 9.5     No flowsheet data found.  CBC Latest Ref Rng & Units 06/04/2016  WBC 3.4 - 10.8 x10E3/uL 7.1  Hemoglobin 13.0 - 17.7 g/dL 11.9  Hematocrit 14.7 - 51.0 % 41.9  Platelets 150 - 379 x10E3/uL 260   Lab Results  Component Value Date   MCV 96 06/04/2016   No results found for: TSH No results found for: HGBA1C   BNP No results found for: BNP  ProBNP No results found for: PROBNP  Lipid Panel  No results found for: CHOL, TRIG, HDL, CHOLHDL, VLDL, LDLCALC, LDLDIRECT   RADIOLOGY: Dg Chest 2 View  Result Date: 06/11/2016 CLINICAL DATA:  Status post pacemaker placement EXAM: CHEST  2 VIEW COMPARISON:  06/04/2016 FINDINGS: No pneumothorax is noted. A pacing  device is now seen in satisfactory position. Cardiac shadow is stable. Hiatal hernia is again seen. No focal infiltrate is noted. Degenerative changes of the thoracic spine are seen. IMPRESSION: No pneumothorax following pacemaker placement. Electronically Signed   By: Alcide Clever M.D.   On: 06/11/2016 07:55   Dg Chest 2 View  Result Date: 06/04/2016 CLINICAL DATA:  Preoperative examination for heart catheterization and pacemaker placement. EXAM: CHEST  2 VIEW COMPARISON:  03/28/2004 chest CT. FINDINGS: Cardiomegaly again identified. An electronic device overlying the left chest is noted. A moderate to large hiatal hernia is noted. There is no evidence of focal airspace disease, pulmonary edema, suspicious pulmonary nodule/mass, pleural effusion, or pneumothorax. No acute bony abnormalities are identified. IMPRESSION: No evidence of acute cardiopulmonary disease. Cardiomegaly and moderate to large hiatal hernia. Electronically Signed   By: Harmon Pier M.D.   On: 06/04/2016 16:42     Additional studies/ records that were reviewed today include:  I reviewed the recent records from [facility]. I have reviewed his 2-D echo Doppler study, exercise Myoview study, laboratory, as well as daily.  Monitors with critical notification from the event monitor that he is currently still wearing.    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 Essential hypertension   3. Abnormal stress test   4. Sick sinus syndrome (HCC)   5. Encounter for long-term (current) use of medications   6. Type 2 diabetes mellitus without complication, without long-term current use of insulin [facility])      PLAN:  Travis [name] is a very pleasant, active and healthy-appearing 76 year old gentleman who has a long-standing history of significant exercise and currently exercises for at least 1-2 hours per day 6 days per week.  He often runs 5-6 miles at a time and does spin class.  Typically, as result of his aerobic conditioning, he has  had long-standing history of sinus bradycardia.  Recently, he has begun to notice dizziness when he is running.  He denies the symptoms when he is doing spin class or resistance training.  As part of his recent evaluation, a 2-D echo Doppler study has shown mild LVH with normal systolic function with an EF of 55-60%.  He did not have significant valvular aortic stenosis in the was mention of normal excursion, although a mild gradient was noted.  He has demonstrated to have chronotropic competence and was able to increase his heart rate to 125 bpm.  During stage III of the standard Bruce protocol he developed asymptomatic ST segment depression concerning for potential ischemia with up to 3 mm apart resolved inferolateral ST segment changes.  However , there appeared to be fairly normal perfusion with probable mild apical defect, more consistent with artifact rather than scar or ischemia.  He has continued to have asymptomatic bradycardia and on monitoring has been now demonstrated to have marked sinus bradycardia with pauses occurring during sleep and in the early morning for which again he remains entirely asymptomatic.  With his marked ECG changes even though perfusion appears normal, it is my recommendation that he undergo definitive cardiac catheterization to completely exclude obstructive epicardial CAD.  Is certainly possible he may have small vessel disease.  With  his prolonged pauses.  He meets criteria for permanent pacemaker insertion.  I'll try to arrange this in the office today such that tomorrow he will undergo diagnostic cardiac catheterization as well as permanent pacemaker implantation.  However, the schedule does not allow this.  With the patient remaining entirely asymptomatic, I have been contacted Dr. Royann Shivers and discussed the timing of pacemaker implantation with him.  Since the patient is entirely is asymptomatic despite having the sinus pauses.  He felt the pacemaker can be deferred to early  next week.  As result, we will schedule him to undergo cardiac catheterization and pacemaker implantation the same day schedule this to be done on Wednesday, May 30.  I will not be in the Cath Lab that day and I will set him up for cardiac catheterization to be done by one of my partners.  The patient was advised not to drive.  He will continue to wear his event monitor and take it off after arrival to the hospital for his procedures.  If he starts to develop any symptoms of presyncope or syncope he was advised to come to the hospital.  A chest x-ray will be obtained today.  He will be undergo laboratory for preoperative assessment.  The risks and benefits of the procedures were discussed with the patient.  I also had Dr. Royann Shivers come in to talk to the patient regarding the pacemaker implantation and plan strategy.  Time spent: 60 minutes Medication Adjustments/Labs and Tests Ordered: Current medicines are reviewed at length with the patient today.  Concerns regarding medicines are outlined above.  Medication changes, Labs and Tests ordered today are listed in the Patient Instructions below. Patient Instructions  Medication Instructions:   STOP lisinopril-HCTZ START lisinopril 10mg  DAILY  START amlodipine 5mg  DAILY  INCREASE atorvastatin from 10mg  to 40mg  DAILY  Labwork:   CMET, Lipid profile (fasting labs) in 3 months  Testing/Procedures:  none  Follow-Up:  In 4 months with Dr. Tresa Endo   If you need a refill on your cardiac medications before your next appointment, please call your pharmacy.      Signed, Nicki Guadalajara, MD  06/18/2016 10:09 PM    [name] [address], [address], [place], [place]  [address] Phone: [phone]

## 2016-06-25 ENCOUNTER — Ambulatory Visit (INDEPENDENT_AMBULATORY_CARE_PROVIDER_SITE_OTHER): Payer: Medicare Other | Admitting: *Deleted

## 2016-06-25 DIAGNOSIS — R001 Bradycardia, unspecified: Secondary | ICD-10-CM

## 2016-06-25 LAB — CUP PACEART INCLINIC DEVICE CHECK
Battery Voltage: 3.21 V
Brady Statistic RA Percent Paced: 73.44 %
Brady Statistic RV Percent Paced: 0.12 %
Date Time Interrogation Session: 20180614144635
Implantable Lead Implant Date: 20180530
Implantable Lead Implant Date: 20180530
Implantable Lead Location: 753860
Implantable Lead Model: 5076
Implantable Pulse Generator Implant Date: 20180530
Lead Channel Impedance Value: 608 Ohm
Lead Channel Pacing Threshold Amplitude: 0.75 V
Lead Channel Pacing Threshold Pulse Width: 0.4 ms
Lead Channel Pacing Threshold Pulse Width: 0.4 ms
Lead Channel Sensing Intrinsic Amplitude: 2.375 mV
Lead Channel Setting Pacing Amplitude: 3.5 V
MDC IDC LEAD LOCATION: 753859
MDC IDC MSMT BATTERY REMAINING LONGEVITY: 158 mo
MDC IDC MSMT LEADCHNL RA IMPEDANCE VALUE: 342 Ohm
MDC IDC MSMT LEADCHNL RA IMPEDANCE VALUE: 456 Ohm
MDC IDC MSMT LEADCHNL RV IMPEDANCE VALUE: 532 Ohm
MDC IDC MSMT LEADCHNL RV PACING THRESHOLD AMPLITUDE: 0.75 V
MDC IDC MSMT LEADCHNL RV SENSING INTR AMPL: 17.625 mV
MDC IDC SET LEADCHNL RV PACING AMPLITUDE: 3.5 V
MDC IDC SET LEADCHNL RV PACING PULSEWIDTH: 0.4 ms
MDC IDC SET LEADCHNL RV SENSING SENSITIVITY: 2 mV
MDC IDC STAT BRADY AP VP PERCENT: 0.09 %
MDC IDC STAT BRADY AP VS PERCENT: 72.9 %
MDC IDC STAT BRADY AS VP PERCENT: 0.03 %
MDC IDC STAT BRADY AS VS PERCENT: 26.98 %

## 2016-06-25 NOTE — Progress Notes (Signed)
Wound check appointment. Staples removed. Wound without redness or edema. Incision edges approximated, wound well healed. Normal device function. Thresholds, sensing, and impedances consistent with implant measurements. Device programmed at 3.5V for extra safety margin until 3 month visit. Histogram distribution appropriate for patient and level of activity. No mode switches. 1 high ventricular rates noted, avg rate 152/167 bpm, duration 1 second. Patient educated about wound care, arm mobility, lifting restrictions. [facility].

## 2016-07-02 NOTE — Telephone Encounter (Signed)
These were all reviewed with the patient; and the patient underwent permanent pacemaker insertion.

## 2016-08-25 ENCOUNTER — Ambulatory Visit: Payer: Medicare Other | Admitting: Podiatry

## 2016-09-01 ENCOUNTER — Ambulatory Visit (INDEPENDENT_AMBULATORY_CARE_PROVIDER_SITE_OTHER): Payer: Medicare Other | Admitting: Podiatry

## 2016-09-01 ENCOUNTER — Encounter: Payer: Self-pay | Admitting: Podiatry

## 2016-09-01 DIAGNOSIS — B351 Tinea unguium: Secondary | ICD-10-CM

## 2016-09-01 DIAGNOSIS — Q828 Other specified congenital malformations of skin: Secondary | ICD-10-CM | POA: Diagnosis not present

## 2016-09-01 DIAGNOSIS — M79676 Pain in unspecified toe(s): Secondary | ICD-10-CM | POA: Diagnosis not present

## 2016-09-02 NOTE — Progress Notes (Signed)
He presents today she complained of painful calluses and painful elongated toenails.  Objective: Pulses remain palpable. Toenails are long thick yellow dystrophic mycotic porokeratotic lesions beneath the first metatarsophalangeal joint bilateral. No open lesions or wounds are noted.  Assessment: Pain in limb secondary to onychomycosis and porokeratotic lesions.  Plan: Debridement of toenails 1 through 5 bilateral.

## 2016-09-14 DIAGNOSIS — I2510 Atherosclerotic heart disease of native coronary artery without angina pectoris: Secondary | ICD-10-CM | POA: Insufficient documentation

## 2016-09-14 NOTE — Progress Notes (Signed)
Cardiology Office Note:    Date:  09/15/2016   ID:  Travis [name], DOB [birth year], MRN [id number]  PCP:  Lorenda Ishihara, MD  Cardiologist: Nicki Guadalajara M.D.; Thurmon Fair, MD    Referring MD: Lorenda Ishihara,*   Chief Complaint  Patient presents with  Follow-up after pacemaker implantation  History of Present Illness:    Travis [name] is a 76 y.o. male with a hx of Hypertension and hyperlipidemia, without known coronary vascular problems to date, severe sinus bradycardia, periods of junctional escape rhythm and sinus pauses of up to 6 seconds in duration on an event monitor.   He underwent elective implantation of a dual-chamber Medtronic Azure XT permanent pacemaker on 06/10/2016. He had coronary angiography the same day which showed 65% mid LAD, 70% second diagonal, 30-40%stenoses in the mid circumflex  He last saw Dr. Tresa Endo in June of this year. He was doing well. He continues to exercise at least 6 days a week for 1 or 2 hours at a time. He reports more energy since pacemaker implantation. However he has noticed that when he runs it takes a long time for him to "get his wind".  Pacemaker interrogation shows excellent lead parameters and expected device longevity of 13.8 years. The heart rate histograms are indeed very blunted. He has 84% atrial pacing and no ventricular pacing. 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 Rate response was turned on with fairly standard out of box settings, although I did allow his upper sensor and tracking rate to go to 130 bpm and increased exertion response from 3 to 4 and activity acceleration from 30 seconds to 15 seconds, since his biggest complaint is that the onset of exercise.  Dr. Tresa Endo added amlodipine to his blood pressure prescription in June, but his blood pressure remains high. Even after being in the office for 20 minutes a day his systolic blood pressure was still around 160. When he checked it  yesterday at [facility] tear his systolic blood pressure was 144.  Past Medical History:  Diagnosis Date  . Heart murmur    "an innocent one" (06/10/2016)  . History of blood transfusion ~ 1956   "while I was in hospital; don't remember why"  . History of kidney stones   . Hyperlipidemia   . Hypertension   . 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 SSS (sick sinus syndrome) (HCC) 06/10/2016  . Type II diabetes mellitus (HCC)     Past Surgical History:  Procedure Laterality Date  . APPENDECTOMY    . CARDIAC CATHETERIZATION  06/10/2016  . CATARACT EXTRACTION W/ INTRAOCULAR LENS IMPLANT Right   . CYSTOSCOPY W/ STONE MANIPULATION    . INSERT / REPLACE / REMOVE PACEMAKER  06/10/2016   dual chamber permanent pacemaker  . KNEE ARTHROSCOPY W/ MENISCECTOMY Bilateral   . LAPAROSCOPIC CHOLECYSTECTOMY    . LEFT HEART CATH AND CORONARY ANGIOGRAPHY N/A 06/10/2016   Procedure: Left Heart Cath and Coronary Angiography;  Surgeon: Dolores Patty, MD;  Location: [facility] INVASIVE CV LAB;  Service: Cardiovascular;  Laterality: N/A;  . LITHOTRIPSY    . PACEMAKER IMPLANT N/A 06/10/2016   Procedure: Pacemaker Implant;  Surgeon: Thurmon Fair, MD;  Location: [facility] INVASIVE CV LAB;  Service: Cardiovascular;  Laterality: N/A;  . TONSILLECTOMY      Current Medications: Current Meds  Medication Sig  . aspirin EC 81 MG tablet Take 81 mg by mouth daily.  Marland Kitchen atorvastatin (LIPITOR) 40 MG tablet Take 1 tablet (40 mg  total) by mouth daily.  . Cholecalciferol (VITAMIN D3) 2000 units capsule Take 2,000 Units by mouth daily.  . COMBIGAN 0.2-0.5 % ophthalmic solution Place 1 drop into both eyes 2 (two) times daily.   . metFORMIN (GLUCOPHAGE) 500 MG tablet Take 500 mg by mouth 2 (two) times daily with a meal.      Allergies:   Patient has no known allergies.   Social History   Social History  . Marital status: Married    Spouse name: N/A  . Number of children: N/A  . Years of education: N/A    Social History Main Topics  . Smoking status: Former Smoker    Packs/day: 0.50    Years: 25.00    Types: Cigarettes    Quit date: 58  . Smokeless tobacco: Never Used  . Alcohol use Yes     Comment: 06/10/2016 "might have 2 beers/month"  . Drug use: No  . Sexual activity: Not Asked   Other Topics Concern  . None   Social History Narrative  . None     Family History: The patient's family history includes Colon cancer in his father. ROS:   Please see the history of present illness.    All other systems reviewed and are negative.  EKGs/Labs/Other Studies Reviewed:    The following studies were reviewed today: Full pacemaker check with reprogramming  EKG:  EKG is not ordered today.    Recent Labs: 05/20/2016: Magnesium 1.8 06/04/2016: BUN 24; Creatinine, Ser 1.16; Hemoglobin 14.2; Platelets 260; Potassium 4.7; Sodium 138  Normal TSH 3.50  Physical Exam:    VS:  BP (!) 160/74   Pulse (!) 51   Ht 5\' 10"  (1.778 m)   Wt 164 lb (74.4 kg)   BMI 23.53 kg/m     Wt Readings from Last 3 Encounters:  09/15/16 164 lb (74.4 kg)  06/16/16 161 lb (73 kg)  06/11/16 161 lb 1.6 oz (73.1 kg)      General: Alert, oriented x3, no distress. Looks very fit Head: no evidence of trauma, PERRL, EOMI, no exophtalmos or lid lag, no myxedema, no xanthelasma; normal ears, nose and oropharynx Neck: normal jugular venous pulsations and no hepatojugular reflux; brisk carotid pulses without delay and no carotid bruits Chest: clear to auscultation, no signs of consolidation by percussion or palpation, normal fremitus, symmetrical and full respiratory excursions. Left subclavian pacemaker site has healed nicely Cardiovascular: normal position and quality of the apical impulse, regular rhythm, normal first and second heart sounds, grade 1-2/6 early peaking aortic ejection murmur, no diastolic murmurs, rubs or gallops Abdomen: no tenderness or distention, no masses by palpation, no abnormal  pulsatility or arterial bruits, normal bowel sounds, no hepatosplenomegaly Extremities: no clubbing, cyanosis or edema; 2+ radial, ulnar and brachial pulses bilaterally; 2+ right femoral, posterior tibial and dorsalis pedis pulses; 2+ left femoral, posterior tibial and dorsalis pedis pulses; no subclavian or femoral bruits Neurological: grossly nonfocal Psych:  Normal affect   ASSESSMENT:    1. SSS (sick sinus syndrome) (HCC)   2. Pacemaker   3. Coronary artery disease involving native coronary artery of native heart without angina pectoris   4. Essential hypertension      PLAN:    In order of problems listed above:  1. SSS: He has evidence not only of sinus bradycardia, but also chronotropic incompetence. Rate response settings turned on with a few changes from out of box settings as described above. Reevaluate heart rate histogram and symptom response in 3  months before making additional adjustments. 2. Pacemaker: Pacemaker has healed well and device function is normal. Remote downloads every 3 months and yearly office visits 3. CAD: He does not have angina pectoris despite a very active lifestyle. 4. HTN: Despite the addition of amlodipine in June his blood pressure remains elevated. Increase lisinopril to 20 mg daily. Asked him to call me or Dr. Tresa EndoKelly in a couple of weeks with his blood pressure readings.    Medication Adjustments/Labs and Tests Ordered: Current medicines are reviewed at length with the patient today.  Concerns regarding medicines are outlined above. Labs and tests ordered and medication changes are outlined in the patient instructions below:  Patient Instructions  Dr Royann Shiversroitoru has recommended making the following medication changes: 1. INCREASE Lisinopril to 20 mg daily  Your physician has requested that you regularly monitor your blood pressure at home. Please use the same machine to check your blood pressure daily. Keep a record of your blood pressures using the  log sheet provided. In 2 weeks, please report your readings back to Dr C. You may use our online patient portal 'MyChart' or you can call the office to speak with a nurse.  Remote monitoring is used to monitor your Pacemaker or ICD from home. This monitoring reduces the number of office visits required to check your device to one time per year. It allows us to keep an eye on the functioning of your device to ensure it is working properly. You are scheduled for a device check from home on Tuesday, December 4th, 2018. You may send your transmission at any time that day. If you have a wireless device, the transmission will be sent automatically. After your physician reviews your transmission, you will receive a notification with your next transmission date.  Dr Royann Shiversroitoru recommends that you schedule a follow-up appointment in 12 months with a pacemaker check. You will receive a reminder letter in the mail two months in advance. If you don't receive a letter, please call our office to schedule the follow-up appointment.  If you need a refill on your cardiac medications before your next appointment, please call your pharmacy.    Signed, Thurmon FairMihai Jazel Nimmons, MD  09/15/2016 10:16 AM    [facility]

## 2016-09-15 ENCOUNTER — Encounter: Payer: Self-pay | Admitting: Cardiovascular Disease

## 2016-09-15 ENCOUNTER — Telehealth: Payer: Self-pay

## 2016-09-15 ENCOUNTER — Ambulatory Visit (INDEPENDENT_AMBULATORY_CARE_PROVIDER_SITE_OTHER): Payer: Medicare Other | Admitting: Cardiovascular Disease

## 2016-09-15 VITALS — BP 160/74 | HR 51 | Ht 70.0 in | Wt 164.0 lb

## 2016-09-15 DIAGNOSIS — R350 Frequency of micturition: Secondary | ICD-10-CM

## 2016-09-15 DIAGNOSIS — Z950 Presence of cardiac pacemaker: Secondary | ICD-10-CM

## 2016-09-15 DIAGNOSIS — I2510 Atherosclerotic heart disease of native coronary artery without angina pectoris: Secondary | ICD-10-CM

## 2016-09-15 DIAGNOSIS — I495 Sick sinus syndrome: Secondary | ICD-10-CM

## 2016-09-15 DIAGNOSIS — Z125 Encounter for screening for malignant neoplasm of prostate: Secondary | ICD-10-CM

## 2016-09-15 DIAGNOSIS — I10 Essential (primary) hypertension: Secondary | ICD-10-CM

## 2016-09-15 DIAGNOSIS — E119 Type 2 diabetes mellitus without complications: Secondary | ICD-10-CM

## 2016-09-15 MED ORDER — LISINOPRIL 20 MG PO TABS
20.0000 mg | ORAL_TABLET | Freq: Every day | ORAL | 3 refills | Status: DC
Start: 2016-09-15 — End: 2017-12-17

## 2016-09-15 NOTE — Patient Instructions (Signed)
Dr Royann Shiversroitoru has recommended making the following medication changes: 1. INCREASE Lisinopril to 20 mg daily  Your physician has requested that you regularly monitor your blood pressure at home. Please use the same machine to check your blood pressure daily. Keep a record of your blood pressures using the log sheet provided. In 2 weeks, please report your readings back to Dr C. You may use our online patient portal 'MyChart' or you can call the office to speak with a nurse.  Remote monitoring is used to monitor your Pacemaker or ICD from home. This monitoring reduces the number of office visits required to check your device to one time per year. It allows us to keep an eye on the functioning of your device to ensure it is working properly. You are scheduled for a device check from home on Tuesday, December 4th, 2018. You may send your transmission at any time that day. If you have a wireless device, the transmission will be sent automatically. After your physician reviews your transmission, you will receive a notification with your next transmission date.  Dr Royann Shiversroitoru recommends that you schedule a follow-up appointment in 12 months with a pacemaker check. You will receive a reminder letter in the mail two months in advance. If you don't receive a letter, please call our office to schedule the follow-up appointment.  If you need a refill on your cardiac medications before your next appointment, please call your pharmacy.

## 2016-09-15 NOTE — Telephone Encounter (Signed)
Pt requested additional lab work to be completed prior to TK appt in November. Discussed with Talbot GrumblingH Sharpe, RN. Okay to order HgbA1c and PSA.

## 2016-09-22 ENCOUNTER — Other Ambulatory Visit: Payer: Self-pay | Admitting: Cardiovascular Disease

## 2016-09-30 ENCOUNTER — Encounter: Payer: Self-pay | Admitting: Cardiovascular Disease

## 2016-11-17 LAB — COMPREHENSIVE METABOLIC PANEL
A/G RATIO: 2.1 (ref 1.2–2.2)
ALT: 19 IU/L (ref 0–44)
AST: 20 IU/L (ref 0–40)
Albumin: 4.8 g/dL (ref 3.5–4.8)
Alkaline Phosphatase: 82 IU/L (ref 39–117)
BILIRUBIN TOTAL: 1.3 mg/dL — AB (ref 0.0–1.2)
BUN/Creatinine Ratio: 19 (ref 10–24)
BUN: 22 mg/dL (ref 8–27)
CALCIUM: 10.8 mg/dL — AB (ref 8.6–10.2)
CHLORIDE: 98 mmol/L (ref 96–106)
CO2: 24 mmol/L (ref 20–29)
Creatinine, Ser: 1.17 mg/dL (ref 0.76–1.27)
GFR, EST AFRICAN AMERICAN: 69 mL/min/{1.73_m2} (ref >59)
GFR, EST NON AFRICAN AMERICAN: 60 mL/min/{1.73_m2} (ref >59)
GLOBULIN, TOTAL: 2.3 g/dL (ref 1.5–4.5)
Glucose: 118 mg/dL — ABNORMAL HIGH (ref 65–99)
POTASSIUM: 5.9 mmol/L — AB (ref 3.5–5.2)
SODIUM: 137 mmol/L (ref 134–144)
Total Protein: 7.1 g/dL (ref 6.0–8.5)

## 2016-11-17 LAB — LIPID PANEL
Chol/HDL Ratio: 3.3 ratio (ref 0.0–5.0)
Cholesterol, Total: 233 mg/dL — ABNORMAL HIGH (ref 100–199)
HDL: 71 mg/dL (ref >39)
LDL Calculated: 140 mg/dL — ABNORMAL HIGH (ref 0–99)
TRIGLYCERIDES: 112 mg/dL (ref 0–149)
VLDL Cholesterol Cal: 22 mg/dL (ref 5–40)

## 2016-11-18 ENCOUNTER — Other Ambulatory Visit: Payer: Self-pay

## 2016-11-18 DIAGNOSIS — E875 Hyperkalemia: Secondary | ICD-10-CM

## 2016-11-18 LAB — BASIC METABOLIC PANEL
BUN/Creatinine Ratio: 26 — ABNORMAL HIGH (ref 10–24)
BUN: 30 mg/dL — AB (ref 8–27)
CALCIUM: 10.3 mg/dL — AB (ref 8.6–10.2)
CHLORIDE: 103 mmol/L (ref 96–106)
CO2: 25 mmol/L (ref 20–29)
Creatinine, Ser: 1.16 mg/dL (ref 0.76–1.27)
GFR calc Af Amer: 70 mL/min/{1.73_m2} (ref >59)
GFR calc non Af Amer: 60 mL/min/{1.73_m2} (ref >59)
Glucose: 105 mg/dL — ABNORMAL HIGH (ref 65–99)
Potassium: 5.8 mmol/L — ABNORMAL HIGH (ref 3.5–5.2)
Sodium: 139 mmol/L (ref 134–144)

## 2016-11-18 LAB — HEMOGLOBIN A1C
ESTIMATED AVERAGE GLUCOSE: 134 mg/dL
Hgb A1c MFr Bld: 6.3 % — ABNORMAL HIGH (ref 4.8–5.6)

## 2016-11-18 LAB — PSA: Prostate Specific Ag, Serum: 5.6 ng/mL — ABNORMAL HIGH (ref 0.0–4.0)

## 2016-11-20 ENCOUNTER — Telehealth: Payer: Self-pay | Admitting: Cardiovascular Disease

## 2016-11-20 NOTE — Telephone Encounter (Signed)
Returning your call. °

## 2016-11-20 NOTE — Telephone Encounter (Signed)
Spoke to patient-aware of results and recommendations, will hold lisinopril until appt with Dr. Tresa EndoKelly.    Patient also states he was drinking an electrolyte drink daily that included potassium in it.  States he does not have anymore and will stop drinking those as well.

## 2016-11-24 ENCOUNTER — Ambulatory Visit (INDEPENDENT_AMBULATORY_CARE_PROVIDER_SITE_OTHER): Payer: Medicare Other | Admitting: Cardiovascular Disease

## 2016-11-24 ENCOUNTER — Encounter: Payer: Self-pay | Admitting: Cardiovascular Disease

## 2016-11-24 VITALS — BP 122/78 | HR 51 | Ht 71.0 in | Wt 157.0 lb

## 2016-11-24 DIAGNOSIS — E875 Hyperkalemia: Secondary | ICD-10-CM

## 2016-11-24 DIAGNOSIS — I10 Essential (primary) hypertension: Secondary | ICD-10-CM

## 2016-11-24 DIAGNOSIS — I2510 Atherosclerotic heart disease of native coronary artery without angina pectoris: Secondary | ICD-10-CM | POA: Diagnosis not present

## 2016-11-24 DIAGNOSIS — Z79899 Other long term (current) drug therapy: Secondary | ICD-10-CM | POA: Diagnosis not present

## 2016-11-24 DIAGNOSIS — E785 Hyperlipidemia, unspecified: Secondary | ICD-10-CM

## 2016-11-24 DIAGNOSIS — I495 Sick sinus syndrome: Secondary | ICD-10-CM

## 2016-11-24 DIAGNOSIS — Z950 Presence of cardiac pacemaker: Secondary | ICD-10-CM | POA: Diagnosis not present

## 2016-11-24 MED ORDER — EZETIMIBE 10 MG PO TABS: 10.0000 mg | ORAL_TABLET | Freq: Every day | ORAL | 3 refills | Status: DC

## 2016-11-24 NOTE — Progress Notes (Signed)
Cardiology Office Note    Date:  11/25/2016   ID:  Travis [name], DOB [birth year], MRN [id number]  PCP:  Leeroy Cha, MD  Referring M.D.: Dr. Leeroy Cha  Cardiologist:  Shelva Majestic, MD   No chief complaint on file.   History of Present Illness:  Travis [name] is a 76 y.o. male who was initially referred through the courtesy of [name] for evaluation of lightheadedness and dizziness while running.  He presents to the office today in follow-up of his pacemaker and cardiac catheterization.  [name] has been active for his entire life.  He has a history of hypertension, hyperlipidemia, and has been diagnosed with type 2 diabetes mellitus for 14 years.  He exercises at least 6 days per week for 1-2 hours.  He does resistance training, spin classes, and typically runs anywhere from 5-6 miles at a 9-10 minute mile pace and has been doing so for many years.  His pulse typically runs in the low 30s.  Recently, he has begun to notice that he becomes dizzy when he starts to run.  This is associated with mild shortness of breath.  He denies any chest pain.  The symptoms have occurred since January.  He denies any frank syncope.  He denies the lightheaded symptoms when he is at rest and his symptoms only occur with significant activity.  When he does spin class he is unaware of any  symptoms.    When I saw for initial evaluation, his ECG revealed marked sinus bradycardia with ventricular rate averaging 37 bpm.  PR interval was 190 ms and QTc interval was 335 ms.  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  He had a 2/6 systolic murmur on physical exam, suggestive of possible aortic valve disease.  I scheduled him for a 2-D echo Doppler study which was done on 06/02/2016.  This showed an ejection fraction at 55-65%.  There was mild LVH.  He did not have any regional wall motion abnormalities.  His aortic valve  was reported as structurally normal.  There was a mean gradient of 9 and a peak gradient of 17.  There was mild MR and mild LA dilation.  His right atrium was mild moderately dilated.  I obtain a magnesium level, which was normal.  I scheduled him for an exercise nuclear stress test to assess both chronotropic competence to exercise as well as myocardial perfusion.  On his exercise portion.  His resting heart rate was 34 bpm, which rose to a maximum of 126/m.  He completed 2 minutes and 2 seconds into stage III of Bruce protocol.  He was able to augment his heart rate appropriately with exercise.  At the end of stage I reflective of good aerobic conditioning ventricular rate was 71, at the end of stage II 100, and in stage III maximum heart rate was 125.  He was entirely asymptomatic but had an exaggerated blood pressure response opted to 16/75 and at peak stress developed asymptomatic ST segment depression up to 3 mm in the inferior and inferolateral leads.  There was a very small mild distal anteroseptal defect which most likely was artifact.  There was no ischemia identified.  He has been wearing an event monitor.  We werereceivingdaily notifications demonstrating marked bradycardia with heart rates in the 30s.  These have been sinus rhythm.  He has been entirely asymptomatic.  He had developed sinus pauses up to 5 -  6 seconds.  He remained entirely asymptomatic and specifically denied any dizziness or lightheadedness or chest pain.  He was unaware of sleep apnea but he does snore.    When I saw him in follow-up on 06/04/2016.  I had extensive discussion with him, both concerning his ST segment changes on ECG during stress testing as well as his need for a permanent pacemaker.  Due to scheduling difficulties and since he remained asymptomatic, ultimately, both of these procedures were done on 06/10/2016.  His diagnostic catheterization was done by Dr. Tiney Rouge I was unable to be in the hospital that day.  I  reviewed the angiographic findings.  He had normal LV function.  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  There was mild 30 and 40% smooth circumflex narrowings.  Later that same day in the afternoon he underwent successful insertion of a permanent pacemaker by Dr. 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  He tolerated both procedures well.  Over the past several months, he has continues to do exceptionally well.  He has continued to run.  He has continued to run and no longer experiences any dizziness or lightheadedness.  He saw Dr. Sallyanne Kuster on 09/15/2016 for pacemaker follow-up evaluation.  He had normal device function.  He is now scheduled to undergo remote download every 3 months and have yearly office visits with Dr. Sallyanne Kuster.  and have yearly office visits with Dr. Sallyanne Kuster.  His blood pressure at that time was elevated his blood pressure at that time was elevated He is now scheduled to undergo remote downloads every 3 months  He had normal device function.  And no longer experiences any dizziness or lightheadedness.  He saw Dr. Sallyanne Kuster on September 15, 2016 for pacemaker follow-up evaluation.   His blood pressure that day was elevated and his lisinopril was increased to 20 mg and he continued to be on amlodipine.  He feels his blood pressure has been better.  He has continued to run races and today participated in the [place] state games where he finished in second place and qualified for the nationals which will be held next June.  He typically runs a mile and 9 minutes and 30 seconds and ran a 5K race around 28- 29 minutes.  He denies any chest pain.  He is unaware of palpitations.  He denies presyncope or syncope.  He had recently undergone blood work and his potassium was elevated.  He was told to hold lisinopril.  Apparently he had also been drinking  electrolyte supplement which was high in potassium.  He presents for evaluation.  Past Medical History:  Diagnosis Date  . Heart murmur    "an innocent one" (06/10/2016)  . History of blood transfusion ~ 1956   "while I was in hospital; don't remember why"  . History of kidney stones   . Hyperlipidemia   . Hypertension   . 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 SSS (sick sinus syndrome) ([place]) 06/10/2016  . Type II diabetes mellitus ([place] [facility])     Past Surgical History:  Procedure Laterality Date  . APPENDECTOMY    . CARDIAC CATHETERIZATION  06/10/2016  . CATARACT EXTRACTION W/ INTRAOCULAR LENS IMPLANT Right   . CYSTOSCOPY W/ STONE MANIPULATION    . INSERT / REPLACE / REMOVE PACEMAKER  06/10/2016   dual chamber permanent pacemaker  .  KNEE ARTHROSCOPY W/ MENISCECTOMY Bilateral   . LAPAROSCOPIC CHOLECYSTECTOMY    . LITHOTRIPSY    . TONSILLECTOMY      Current Medications: Outpatient Medications Prior to Visit  Medication Sig Dispense Refill  . amLODipine (NORVASC) 5 MG tablet Take 5 mg daily by mouth.    Marland Kitchen aspirin EC 81 MG tablet Take 81 mg by mouth daily.    Marland Kitchen atorvastatin (LIPITOR) 40 MG tablet Take 1 tablet (40 mg total) by mouth daily. 90 tablet 1  . Cholecalciferol (VITAMIN D3) 2000 units capsule Take 2,000 Units by mouth daily.    . COMBIGAN 0.2-0.5 % ophthalmic solution Place 1 drop into both eyes 2 (two) times daily.     Marland Kitchen lisinopril (PRINIVIL,ZESTRIL) 20 MG tablet Take 1 tablet (20 mg total) by mouth daily. 90 tablet 3  . metFORMIN (GLUCOPHAGE) 500 MG tablet Take 500 mg by mouth 2 (two) times daily with a meal.     . amLODipine (NORVASC) 5 MG tablet Take 1 tablet (5 mg total) by mouth daily. 90 tablet 1   No facility-administered medications prior to visit.      Allergies:   Patient has no known allergies.   Social History   Socioeconomic History  . Marital status: Married    Spouse name: None  . Number of children: None  . Years of education: None    . Highest education level: None  Social Needs  . Financial resource strain: None  . Food insecurity - worry: None  . Food insecurity - inability: None  . Transportation needs - medical: None  . Transportation needs - non-medical: None  Occupational History  . None  Tobacco Use  . Smoking status: Former Smoker    Packs/day: 0.50    Years: 25.00    Pack years: 12.50    Types: Cigarettes    Last attempt to quit: 1983    Years since quitting: 35.8  . Smokeless tobacco: Never Used  Substance and Sexual Activity  . Alcohol use: Yes    Comment: 06/10/2016 "might have 2 beers/month"  . Drug use: No  . Sexual activity: None  Other Topics Concern  . None  Social History Narrative  . None    Social history is notable in that he was born in [place].  He's been married for 54 years.  He has 2 children, 7 grandchildren, and one great-grandchild.  He has a Scientist, water quality in Theatre stage manager.  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  He is retired.  He drinks occasional beer.  He does not smoke.   Family History:  The patient's family history includes Colon cancer in his father..  He has a brother age 22 and a sister age 30, who are healthy.   ROS General: Negative; No fevers, chills, or night sweats;  HEENT: Negative; No changes in vision or hearing, sinus congestion, difficulty swallowing Pulmonary: Negative; No cough, wheezing, shortness of breath, hemoptysis Cardiovascular:  See HPI GI: Negative; No nausea, vomiting, diarrhea, or abdominal pain GU: Negative; No dysuria, hematuria, or difficulty voiding Musculoskeletal: Negative; no myalgias, joint pain, or weakness Hematologic/Oncology: Negative; no easy bruising, bleeding Endocrine: Negative; no heat/cold intolerance; no diabetes Neuro: Negative; no changes in balance, headaches Skin: Negative; No rashes or skin lesions Psychiatric: Negative; No behavioral problems, depression Sleep: Positive for  snoring, no daytime sleepiness, hypersomnolence, bruxism, restless legs, hypnogognic hallucinations, no cataplexy Other comprehensive 14 point system review is negative.   PHYSICAL EXAM:  VS:  BP 122/78   Pulse (!) 51   Ht '5\' 11"'  (1.803 m)   Wt 157 lb (71.2 kg)   BMI 21.90 kg/m     Repeat blood pressure today was 124/80/80.  Wt Readings from Last 3 Encounters:  11/24/16 157 lb (71.2 kg)  09/15/16 164 lb (74.4 kg)  06/16/16 161 lb (73 kg)    General: Alert, oriented, no distress.  Skin: normal turgor, no rashes, warm and dry HEENT: Normocephalic, atraumatic. Pupils equal round and reactive to light; sclera anicteric; extraocular muscles intact;  Nose without nasal septal hypertrophy Mouth/Parynx benign; Mallinpatti scale Neck: No JVD, no carotid bruits; normal carotid upstroke Lungs: clear to ausculatation and percussion; no wheezing or rales Chest wall: without tenderness to palpitation Heart: PMI not displaced, RRR, s1 s2 normal, 1/6 systolic murmur, no diastolic murmur, no rubs, gallops, thrills, or heaves Abdomen: soft, nontender; no hepatosplenomehaly, BS+; abdominal aorta nontender and not dilated by palpation. Back: no CVA tenderness Pulses 2+ Musculoskeletal: full range of motion, normal strength, no joint deformities Extremities: no clubbing cyanosis or edema, Homan's sign negative  Neurologic: grossly nonfocal; Cranial nerves grossly wnl Psychologic: Normal mood and affectt    Studies/Labs Reviewed:   EKG:  EKG is ordered today.  A paced rhythm at 51 bpm.  PR interval 222 ms.  Mild LVH.  QTc interval 372 ms  06/16/2016 ECG (independently read by me): Atrially paced rhythm at 50 bpm with prolonged AV conduction with a PR interval at 214 ms.  06/04/2016 ECG (independently read by me): Marked sinus bradycardia with possible low atrial involvement.  PR interval 166 ms, QTc interval 335 ms.  Ventricular rate 36.  05/20/2016 ECG (independently read by me): Marked  sinus bradycardia with sinus arrhythmia, ventricular rate in the 40s to upper 30s, average 37.  PR interval 190 ms.  QTc interval 335 ms.  No ST segment changes.  Recent Labs: BMP Latest Ref Rng & Units 11/18/2016 11/17/2016 06/04/2016  Glucose 65 - 99 mg/dL 105(H) 118(H) 118(H)  BUN 8 - 27 mg/dL 30(H) 22 24  Creatinine 0.76 - 1.27 mg/dL 1.16 1.17 1.16  BUN/Creat Ratio 10 - 24 26(H) 19 21  Sodium 134 - 144 mmol/L 139 137 138  Potassium 3.5 - 5.2 mmol/L 5.8(H) 5.9(H) 4.7  Chloride 96 - 106 mmol/L 103 98 104  CO2 20 - 29 mmol/L '25 24 20  ' Calcium 8.6 - 10.2 mg/dL 10.3(H) 10.8(H) 9.5     Hepatic Function Latest Ref Rng & Units 11/17/2016  Total Protein 6.0 - 8.5 g/dL 7.1  Albumin 3.5 - 4.8 g/dL 4.8  AST 0 - 40 IU/L 20  ALT 0 - 44 IU/L 19  Alk Phosphatase 39 - 117 IU/L 82  Total Bilirubin 0.0 - 1.2 mg/dL 1.3(H)    CBC Latest Ref Rng & Units 06/04/2016  WBC 3.4 - 10.8 x10E3/uL 7.1  Hemoglobin 13.0 - 17.7 g/dL 14.2  Hematocrit 37.5 - 51.0 % 41.9  Platelets 150 - 379 x10E3/uL 260   Lab Results  Component Value Date   MCV 96 06/04/2016   No results found for: TSH Lab Results  Component Value Date   HGBA1C 6.3 (H) 11/17/2016     BNP No results found for: BNP  ProBNP No results found for: PROBNP   Lipid Panel     Component Value Date/Time   CHOL 233 (H) 11/17/2016 1044   TRIG 112 11/17/2016 1044   HDL 71 11/17/2016 1044   CHOLHDL 3.3 11/17/2016 1044  LDLCALC 140 (H) 11/17/2016 1044     RADIOLOGY: No results found.   Additional studies/ records that were reviewed today include:  I reviewed the recent records from [facility]. I have reviewed his 2-D echo Doppler study, exercise Myoview study, laboratory, as well as daily  Monitors with critical notification from the event monitor prior to the insertion of the pacemaker.  I personally reviewed the catheterization findings, as well as his pacemaker implantation report.   His recent office visit with Dr. Sallyanne Kuster was  reviewed as well as his most recent laboratory which was done over the past week.    Oh ASSESSMENT:    1. Coronary artery disease involving native coronary artery of native heart without angina pectoris   2. Serum potassium elevated   3. Medication management   4. Hyperlipidemia, unspecified hyperlipidemia type   5. SSS (sick sinus syndrome) (HCC)   6. Pacemaker   7. Essential hypertension      PLAN:  Mr. Isaah Furry is a very pleasant, active and healthy-appearing 76 year old gentleman who has a long-standing history of significant exercise and has been exercising for at least 1-2 hours per day 6 days per week.  He often runs 5-6 miles at a time and does spin class.  Typically, as result of his aerobic conditioning, he has had long-standing history of sinus bradycardia.  Recently, he had begun to notice dizziness during the early portion of his run.  He denied the symptoms when he is doing spin class or resistance training.  A 2-D echo Doppler study revealed mild LVH with normal systolic function with an EF of 55-60%.  He did not have significant valvular aortic stenosis and had normal excursion, although a mild gradient was noted.  He has demonstrated to have chronotropic competence and was able to increase his heart rate to 125 bpm.  During stage III of the standard Bruce protocol he developed asymptomatic ST segment depression concerning for potential ischemia with up to 3 mm apart resolved inferolateral ST segment changes.  However , there appeared to be fairly normal perfusion with probable mild apical defect, more consistent with artifact rather than scar or ischemia.  He continued to have marked bradycardia and on monitoring was demonstrated to have marked sinus bradycardia with pauses occurring during sleep and in the early morning for which again he remains entirely asymptomatic.  He underwent diagnostic cardiac catheterization which revealed a 60-70% calcified LAD lesion and normal EF.   His pacemaker was inserted and since that time he has felt significantly improved and is able to run faster without experiencing the previous episodes of mild lightheadedness that he had experienced.  He participated in the [place] games today and qualified for the [place] nationals based on his age group.  He denies any exertionally precipitated angina.  I reviewed recent laboratory.  His potassium was elevated and this may be contributed by his previous intake of supplemental electrolytes in a liquid form which were high in which was high in potassium.  In addition his lisinopril has been held.  We will repeat his chemistry test this week.  His blood pressure remains stable.  Lipid studies from 1 week ago were elevated with a total cholesterol of 233 and LDL cholesterol 140.  He is on atorvastatin 40 mg and in the past did note some mild potential myalgias at higher dose.  I will initially add Zetia 10 mg to his regimen.  Repeat lipid studies will be rechecked in 3 months.  He may be a candidate for Repatha if LDL cannot get below 70 or if he cannot tolerate atorvastatin 80 mg we may try switching him to Crestor 40 mg at that time.  I will contact him regarding follow-up laboratory.  I will see him in 6 months for reevaluation.  Medication Adjustments/Labs and Tests Ordered: Current medicines are reviewed at length with the patient today.  Concerns regarding medicines are outlined above.  Medication changes, Labs and Tests ordered today are listed in the Patient Instructions below. Patient Instructions  Medication Instructions:  START Zetia 10 mg daily  Labwork: Please return for labs in Thursday or Friday this week. (BMET)  Please return for FASTING labs in 3 months (CMET,Lipid)  Follow-Up: Your physician wants you to follow-up in: 6 months with Dr. Claiborne Billings. You will receive a reminder letter in the mail two months in advance. If you don't receive a letter, please call our office to  schedule the follow-up appointment.   Any Other Special Instructions Will Be Listed Below (If Applicable).     If you need a refill on your cardiac medications before your next appointment, please call your pharmacy.      Signed, Shelva Majestic, MD  11/25/2016 2:30 PM    [facility] [address] [place], [place], [place]  [address] Phone: [phone]      Cardiology Office Note    Date:  11/25/2016   ID:  MADOC HOLQUIN, DOB 01/01/1941, MRN [id number]  PCP:  Leeroy Cha, MD  Referring M.D.: Dr. Leeroy Cha  Cardiologist:  Shelva Majestic, MD   No chief complaint on file.   History of Present Illness:  Travis [name] is a 76 y.o. male who is referred through the courtesy of [facility] for evaluation of lightheadedness and dizziness while running.  Mr. Fruth has been active for his entire life.  He has a history of hypertension, hyperlipidemia, and has been diagnosed with type 2 diabetes mellitus for 14 years.  He exercises at least 6 days per week for 1-2 hours.  He does resistance training, spin classes, and typically runs anywhere from 5-6 miles at a 9-10 minute mile pace and has been doing so for many years.  His pulse typically runs in the low 30s.  Recently, he has begun to notice that he becomes dizzy when he starts to run.  This is associated with mild shortness of breath.  He denies any chest pain.  The symptoms have occurred since January.  He denies any frank syncope.  He denies the lightheaded symptoms when he is at rest and his symptoms only occur with significant activity.  When he does spin class he is unaware of any  symptoms.    When I saw for initial evaluation, his ECG revealed marked sinus bradycardia with ventricular rate averaging 37 bpm.  PR interval was 190 ms and QTc interval was 335 ms.  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  He had a 2/6  systolic murmur on physical exam, suggestive of possible aortic valve disease.  I scheduled him for a 2-D echo Doppler study which was done on 06/02/2016.  This showed an ejection fraction at 55-65%.  There was mild LVH.  He did not have any regional wall motion abnormalities.  His aortic valve was reported as structurally normal.  There was a mean gradient of 9 and a peak gradient of 17.  There was  mild MR and mild LA dilation.  His right atrium was mild moderately dilated.  I obtain a magnesium level, which was normal.  I scheduled him for an exercise nuclear stress test to assess both chronotropic competence to exercise as well as myocardial perfusion.  On his exercise portion.  His resting heart rate was 34 bpm, which rose to a maximum of 126/m.  He completed 2 minutes and 2 seconds into stage III of Bruce protocol.  He was able to augment his heart rate appropriately with exercise.  At the end of stage I reflective of good aerobic conditioning ventricular rate was 71, at the end of stage II 100, and in stage III maximum heart rate was 125.  He was entirely asymptomatic but had an exaggerated blood pressure response opted to 16/75 and at peak stress developed asymptomatic ST segment depression up to 3 mm in the inferior and inferolateral leads.  There was a very small mild distal anteroseptal defect which most likely was artifact.  There was no ischemia identified.  He has been wearing an event monitor.  We have been getting daily notifications demonstrating marked bradycardia with heart rates in the 30s.  These have been sinus rhythm.  He has been entirely asymptomatic.  He has developed sinus positives up to proximally 5 seconds.  On 06/03/2016 at 4:57 AM 5.4 second pause was noted.  This a.m., after awakening at 6:59 AM a 4.8 second pause was noted.  He again remains entirely asymptomatic and specifically denies any dizziness or lightheadedness.  He denies chest pain.  He is unaware of sleep apnea but he does  snore.  He resents for follow-up cardiology evaluation.   Past Medical History:  Diagnosis Date  . Heart murmur    "an innocent one" (06/10/2016)  . History of blood transfusion ~ 1956   "while I was in hospital; don't remember why"  . History of kidney stones   . Hyperlipidemia   . Hypertension   . 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 SSS (sick sinus syndrome) ([facility]) 06/10/2016  . Type II diabetes mellitus ([facility])     Past Surgical History:  Procedure Laterality Date  . APPENDECTOMY    . CARDIAC CATHETERIZATION  06/10/2016  . CATARACT EXTRACTION W/ INTRAOCULAR LENS IMPLANT Right   . CYSTOSCOPY W/ STONE MANIPULATION    . INSERT / REPLACE / REMOVE PACEMAKER  06/10/2016   dual chamber permanent pacemaker  . KNEE ARTHROSCOPY W/ MENISCECTOMY Bilateral   . LAPAROSCOPIC CHOLECYSTECTOMY    . LITHOTRIPSY    . TONSILLECTOMY      Current Medications: Outpatient Medications Prior to Visit  Medication Sig Dispense Refill  . amLODipine (NORVASC) 5 MG tablet Take 5 mg daily by mouth.    Marland Kitchen aspirin EC 81 MG tablet Take 81 mg by mouth daily.    Marland Kitchen atorvastatin (LIPITOR) 40 MG tablet Take 1 tablet (40 mg total) by mouth daily. 90 tablet 1  . Cholecalciferol (VITAMIN D3) 2000 units capsule Take 2,000 Units by mouth daily.    . COMBIGAN 0.2-0.5 % ophthalmic solution Place 1 drop into both eyes 2 (two) times daily.     Marland Kitchen lisinopril (PRINIVIL,ZESTRIL) 20 MG tablet Take 1 tablet (20 mg total) by mouth daily. 90 tablet 3  . metFORMIN (GLUCOPHAGE) 500 MG tablet Take 500 mg by mouth 2 (two) times daily with a meal.     . amLODipine (NORVASC) 5 MG tablet Take 1 tablet (5 mg total) by mouth daily. [place]  tablet 1   No facility-administered medications prior to visit.      Allergies:   Patient has no known allergies.   Social History   Socioeconomic History  . Marital status: Married    Spouse name: None  . Number of children: None  . Years of education: None  . Highest education  level: None  Social Needs  . Financial resource strain: None  . Food insecurity - worry: None  . Food insecurity - inability: None  . Transportation needs - medical: None  . Transportation needs - non-medical: None  Occupational History  . None  Tobacco Use  . Smoking status: Former Smoker    Packs/day: 0.50    Years: 25.00    Pack years: 12.50    Types: Cigarettes    Last attempt to quit: 1983    Years since quitting: 35.8  . Smokeless tobacco: Never Used  Substance and Sexual Activity  . Alcohol use: Yes    Comment: 06/10/2016 "might have 2 beers/month"  . Drug use: No  . Sexual activity: None  Other Topics Concern  . None  Social History Narrative  . None    Social history is notable in that he was born in [place].  He's been married for 54 years.  He has 2 children, 7 grandchildren, and one great-grandchild.  He has a Scientist, water quality in Theatre stage manager.  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  He is retired.  He drinks occasional beer.  He does not smoke.   Family History:  The patient's family history includes Colon cancer in his father..  He has a brother age 31 and a sister age 41, who are healthy.   ROS General: Negative; No fevers, chills, or night sweats;  HEENT: Negative; No changes in vision or hearing, sinus congestion, difficulty swallowing Pulmonary: Negative; No cough, wheezing, shortness of breath, hemoptysis Cardiovascular:  See HPI GI: Negative; No nausea, vomiting, diarrhea, or abdominal pain GU: Negative; No dysuria, hematuria, or difficulty voiding Musculoskeletal: Negative; no myalgias, joint pain, or weakness Hematologic/Oncology: Negative; no easy bruising, bleeding Endocrine: Negative; no heat/cold intolerance; no diabetes Neuro: Negative; no changes in balance, headaches Skin: Negative; No rashes or skin lesions Psychiatric: Negative; No behavioral problems, depression Sleep: Positive for snoring, no daytime  sleepiness, hypersomnolence, bruxism, restless legs, hypnogognic hallucinations, no cataplexy Other comprehensive 14 point system review is negative.   PHYSICAL EXAM:   VS:  BP 122/78   Pulse (!) 51   Ht '5\' 11"'  (1.803 m)   Wt 157 lb (71.2 kg)   BMI 21.90 kg/m     Repeat blood pressure was 142/80.  He was entirely asymptomatic. Wt Readings from Last 3 Encounters:  11/24/16 157 lb (71.2 kg)  09/15/16 164 lb (74.4 kg)  06/16/16 161 lb (73 kg)    General: Alert, oriented, no distress.  Skin: normal turgor, no rashes, warm and dry HEENT: Normocephalic, atraumatic. Pupils equal round and reactive to light; sclera anicteric; extraocular muscles intact; Fundi Normal Nose without nasal septal hypertrophy Mouth/Parynx benign; Mallinpatti scale 2 Neck: No JVD, no carotid bruits; normal carotid upstroke Lungs: clear to ausculatation and percussion; no wheezing or rales Chest wall: without tenderness to palpitation Heart: PMI not displaced, bradycardic with heart rate in the upper 30s-40s, s1 s2 normal, 2/6 systolic murmur in the aortic area, no diastolic murmur, no rubs, gallops, thrills, or heaves Abdomen: soft, nontender; no hepatosplenomehaly, BS+; abdominal aorta nontender and not dilated by  palpation. Back: no CVA tenderness Pulses 2+ Musculoskeletal: full range of motion, normal strength, no joint deformities Extremities: no clubbing cyanosis or edema, Homan's sign negative  Neurologic: grossly nonfocal; Cranial nerves grossly wnl Psychologic: Normal mood and affect   Studies/Labs Reviewed:   EKG:  EKG is ordered today.  ECG (independently read by me): Atrial paced rhythm at 50 with ventricular sensing.  AV conduction 214 ms.  QTc interval 371 ms.  No skin ST-T changes.  06/04/2016 ECG (independently read by me): Marked sinus bradycardia with possible low atrial involvement.  PR interval 166 ms, QTc interval 335 ms.  Ventricular rate 36.  05/20/2016 ECG (independently read by me):  Marked sinus bradycardia with sinus arrhythmia, ventricular rate in the 40s to upper 30s, average 37.  PR interval 190 ms.  QTc interval 335 ms.  No ST segment changes.  Recent Labs: BMP Latest Ref Rng & Units 11/18/2016 11/17/2016 06/04/2016  Glucose 65 - 99 mg/dL 105(H) 118(H) 118(H)  BUN 8 - 27 mg/dL 30(H) 22 24  Creatinine 0.76 - 1.27 mg/dL 1.16 1.17 1.16  BUN/Creat Ratio 10 - 24 26(H) 19 21  Sodium 134 - 144 mmol/L 139 137 138  Potassium 3.5 - 5.2 mmol/L 5.8(H) 5.9(H) 4.7  Chloride 96 - 106 mmol/L 103 98 104  CO2 20 - 29 mmol/L '25 24 20  ' Calcium 8.6 - 10.2 mg/dL 10.3(H) 10.8(H) 9.5     Hepatic Function Latest Ref Rng & Units 11/17/2016  Total Protein 6.0 - 8.5 g/dL 7.1  Albumin 3.5 - 4.8 g/dL 4.8  AST 0 - 40 IU/L 20  ALT 0 - 44 IU/L 19  Alk Phosphatase 39 - 117 IU/L 82  Total Bilirubin 0.0 - 1.2 mg/dL 1.3(H)    CBC Latest Ref Rng & Units 06/04/2016  WBC 3.4 - 10.8 x10E3/uL 7.1  Hemoglobin 13.0 - 17.7 g/dL 14.2  Hematocrit 37.5 - 51.0 % 41.9  Platelets 150 - 379 x10E3/uL 260   Lab Results  Component Value Date   MCV 96 06/04/2016   No results found for: TSH Lab Results  Component Value Date   HGBA1C 6.3 (H) 11/17/2016     BNP No results found for: BNP  ProBNP No results found for: PROBNP   Lipid Panel     Component Value Date/Time   CHOL 233 (H) 11/17/2016 1044   TRIG 112 11/17/2016 1044   HDL 71 11/17/2016 1044   CHOLHDL 3.3 11/17/2016 1044   LDLCALC 140 (H) 11/17/2016 1044     RADIOLOGY: No results found.   Additional studies/ records that were reviewed today include:  I reviewed the recent records from [facility]. I have reviewed his 2-D echo Doppler study, exercise Myoview study, laboratory, as well as daily.  Monitors with critical notification from the event monitor that he is currently still wearing.    ASSESSMENT:    1. Coronary artery disease involving native coronary artery of native heart without angina pectoris   2. Serum potassium  elevated   3. Medication management   4. Hyperlipidemia, unspecified hyperlipidemia type   5. SSS (sick sinus syndrome) (HCC)   6. Pacemaker   7. Essential hypertension      PLAN:  Mr. Travis [name] is a very pleasant, active and healthy-appearing 76 year old gentleman who has a long-standing history of significant exercise and currently exercises for at least 1-2 hours per day 6 days per week.  He often runs 5-6 miles at a time and does spin class.  Typically, as result of  his aerobic conditioning, he has had long-standing history of sinus bradycardia.  Recently, he has begun to notice dizziness when he is running.  He denies the symptoms when he is doing spin class or resistance training.  As part of his recent evaluation, a 2-D echo Doppler study has shown mild LVH with normal systolic function with an EF of 55-60%.  He did not have significant valvular aortic stenosis in the was mention of normal excursion, although a mild gradient was noted.  He has demonstrated to have chronotropic competence and was able to increase his heart rate to 125 bpm.  During stage III of the standard Bruce protocol he developed asymptomatic ST segment depression concerning for potential ischemia with up to 3 mm apart resolved inferolateral ST segment changes.  However , there appeared to be fairly normal perfusion with probable mild apical defect, more consistent with artifact rather than scar or ischemia.  He has continued to have asymptomatic bradycardia and on monitoring has been now demonstrated to have marked sinus bradycardia with pauses occurring during sleep and in the early morning for which again he remains entirely asymptomatic.  With his marked ECG changes even though perfusion appears normal, it is my recommendation that he undergo definitive cardiac catheterization to completely exclude obstructive epicardial CAD.  Is certainly possible he may have small vessel disease.  With his prolonged pauses.  He meets  criteria for permanent pacemaker insertion.  I'll try to arrange this in the office today such that tomorrow he will undergo diagnostic cardiac catheterization as well as permanent pacemaker implantation.  However, the schedule does not allow this.  With the patient remaining entirely asymptomatic, I have been contacted Dr. Sallyanne Kuster and discussed the timing of pacemaker implantation with him.  Since the patient is entirely is asymptomatic despite having the sinus pauses.  He felt the pacemaker can be deferred to early next week.  As result, we will schedule him to undergo cardiac catheterization and pacemaker implantation the same day schedule this to be done on Wednesday, May 30.  I will not be in the Cath Lab that day and I will set him up for cardiac catheterization to be done by one of my partners.  The patient was advised not to drive.  He will continue to wear his event monitor and take it off after arrival to the hospital for his procedures.  If he starts to develop any symptoms of presyncope or syncope he was advised to come to the hospital.  A chest x-ray will be obtained today.  He will be undergo laboratory for preoperative assessment.  The risks and benefits of the procedures were discussed with the patient.  I also had Dr. Sallyanne Kuster come in to talk to the patient regarding the pacemaker implantation and plan strategy.  Time spent: 60 minutes Medication Adjustments/Labs and Tests Ordered: Current medicines are reviewed at length with the patient today.  Concerns regarding medicines are outlined above.  Medication changes, Labs and Tests ordered today are listed in the Patient Instructions below. Patient Instructions  Medication Instructions:  START Zetia 10 mg daily  Labwork: Please return for labs in Thursday or Friday this week. (BMET)  Please return for FASTING labs in 3 months (CMET,Lipid)  Follow-Up: Your physician wants you to follow-up in: 6 months with Dr. Claiborne Billings. You will receive a  reminder letter in the mail two months in advance. If you don't receive a letter, please call our office to schedule the follow-up appointment.   Any  Other Special Instructions Will Be Listed Below (If Applicable).     If you need a refill on your cardiac medications before your next appointment, please call your pharmacy.      Signed, Shelva Majestic, MD  11/25/2016 2:30 PM    [facility] [address], [place], [place], [place]  [address] Phone: [phone]

## 2016-11-24 NOTE — Patient Instructions (Signed)
Medication Instructions:  START Zetia 10 mg daily  Labwork: Please return for labs in Thursday or Friday this week. (BMET)  Please return for FASTING labs in 3 months (CMET,Lipid)  Follow-Up: Your physician wants you to follow-up in: 6 months with Dr. Tresa EndoKelly. You will receive a reminder letter in the mail two months in advance. If you don't receive a letter, please call our office to schedule the follow-up appointment.   Any Other Special Instructions Will Be Listed Below (If Applicable).     If you need a refill on your cardiac medications before your next appointment, please call your pharmacy.

## 2016-11-25 ENCOUNTER — Encounter: Payer: Self-pay | Admitting: Cardiovascular Disease

## 2016-11-28 LAB — BASIC METABOLIC PANEL
BUN / CREAT RATIO: 21 (ref 10–24)
BUN: 23 mg/dL (ref 8–27)
CHLORIDE: 98 mmol/L (ref 96–106)
CO2: 26 mmol/L (ref 20–29)
Calcium: 10.2 mg/dL (ref 8.6–10.2)
Creatinine, Ser: 1.12 mg/dL (ref 0.76–1.27)
GFR calc non Af Amer: 63 mL/min/{1.73_m2} (ref >59)
GFR, EST AFRICAN AMERICAN: 73 mL/min/{1.73_m2} (ref >59)
GLUCOSE: 108 mg/dL — AB (ref 65–99)
POTASSIUM: 4.2 mmol/L (ref 3.5–5.2)
SODIUM: 138 mmol/L (ref 134–144)

## 2016-12-15 ENCOUNTER — Ambulatory Visit (INDEPENDENT_AMBULATORY_CARE_PROVIDER_SITE_OTHER): Payer: Medicare Other | Admitting: *Deleted

## 2016-12-15 ENCOUNTER — Telehealth: Payer: Self-pay | Admitting: Cardiovascular Disease

## 2016-12-15 DIAGNOSIS — I495 Sick sinus syndrome: Secondary | ICD-10-CM

## 2016-12-15 NOTE — Progress Notes (Signed)
Remote pacemaker transmission.   

## 2016-12-15 NOTE — Telephone Encounter (Signed)
NEw Message  Pt call requesting to speak with RN to see if transmission was received. Please call back

## 2016-12-15 NOTE — Telephone Encounter (Signed)
Spoke with patient and successfully walked though steps to sending a manual transmission.

## 2016-12-16 ENCOUNTER — Encounter: Payer: Self-pay | Admitting: Cardiology

## 2016-12-16 LAB — CUP PACEART REMOTE DEVICE CHECK
Battery Voltage: 3.15 V
Brady Statistic AP VP Percent: 0.04 %
Brady Statistic AS VP Percent: 0.01 %
Brady Statistic AS VS Percent: 17.52 %
Implantable Lead Implant Date: 20180530
Implantable Lead Implant Date: 20180530
Implantable Lead Model: 5076
Lead Channel Impedance Value: 285 Ohm
Lead Channel Impedance Value: 361 Ohm
Lead Channel Impedance Value: 399 Ohm
Lead Channel Pacing Threshold Amplitude: 0.625 V
Lead Channel Pacing Threshold Amplitude: 0.75 V
Lead Channel Pacing Threshold Pulse Width: 0.4 ms
Lead Channel Sensing Intrinsic Amplitude: 2.75 mV
Lead Channel Setting Pacing Amplitude: 1.5 V
MDC IDC LEAD LOCATION: 753859
MDC IDC LEAD LOCATION: 753860
MDC IDC MSMT BATTERY REMAINING LONGEVITY: 164 mo
MDC IDC MSMT LEADCHNL RA PACING THRESHOLD PULSEWIDTH: 0.4 ms
MDC IDC MSMT LEADCHNL RA SENSING INTR AMPL: 2.75 mV
MDC IDC MSMT LEADCHNL RV IMPEDANCE VALUE: 437 Ohm
MDC IDC MSMT LEADCHNL RV SENSING INTR AMPL: 19.875 mV
MDC IDC MSMT LEADCHNL RV SENSING INTR AMPL: 19.875 mV
MDC IDC PG IMPLANT DT: 20180530
MDC IDC SESS DTM: 20181204140336
MDC IDC SET LEADCHNL RV PACING AMPLITUDE: 2 V
MDC IDC SET LEADCHNL RV PACING PULSEWIDTH: 0.4 ms
MDC IDC SET LEADCHNL RV SENSING SENSITIVITY: 2 mV
MDC IDC STAT BRADY AP VS PERCENT: 82.43 %
MDC IDC STAT BRADY RA PERCENT PACED: 83.94 %
MDC IDC STAT BRADY RV PERCENT PACED: 0.05 %

## 2017-01-06 ENCOUNTER — Other Ambulatory Visit: Payer: Self-pay | Admitting: Cardiovascular Disease

## 2017-02-02 ENCOUNTER — Ambulatory Visit (INDEPENDENT_AMBULATORY_CARE_PROVIDER_SITE_OTHER): Payer: Medicare Other | Admitting: Podiatry

## 2017-02-02 DIAGNOSIS — Q828 Other specified congenital malformations of skin: Secondary | ICD-10-CM

## 2017-02-02 DIAGNOSIS — B351 Tinea unguium: Secondary | ICD-10-CM

## 2017-02-02 DIAGNOSIS — M79676 Pain in unspecified toe(s): Secondary | ICD-10-CM

## 2017-02-02 NOTE — Progress Notes (Signed)
Presents today chief complaint of painful elongated toenails.  Also complaining of painful poor keratomas bilateral.  States that his diabetes is well controlled.  Continues to be very active.  Objective: Vital signs are stable alert and oriented x3 pulses are strongly palpable.  No open lesions or wounds.  Reactive hyperkeratosis are present to the plantar aspect of the bilateral foot sub-fifth and sub-first.  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  Plan: Debridement of toenails 1 through 5 bilateral and debridement of all reactive hyperkeratosis.

## 2017-03-03 LAB — COMPREHENSIVE METABOLIC PANEL
A/G RATIO: 2.4 — AB (ref 1.2–2.2)
ALBUMIN: 4.5 g/dL (ref 3.5–4.8)
ALK PHOS: 62 IU/L (ref 39–117)
ALT: 21 IU/L (ref 0–44)
AST: 19 IU/L (ref 0–40)
BUN/Creatinine Ratio: 19 (ref 10–24)
BUN: 22 mg/dL (ref 8–27)
Bilirubin Total: 1.2 mg/dL (ref 0.0–1.2)
CO2: 23 mmol/L (ref 20–29)
CREATININE: 1.13 mg/dL (ref 0.76–1.27)
Calcium: 10.2 mg/dL (ref 8.6–10.2)
Chloride: 106 mmol/L (ref 96–106)
GFR calc non Af Amer: 62 mL/min/{1.73_m2} (ref >59)
GFR, EST AFRICAN AMERICAN: 72 mL/min/{1.73_m2} (ref >59)
GLOBULIN, TOTAL: 1.9 g/dL (ref 1.5–4.5)
Glucose: 119 mg/dL — ABNORMAL HIGH (ref 65–99)
Potassium: 4.7 mmol/L (ref 3.5–5.2)
Sodium: 140 mmol/L (ref 134–144)
Total Protein: 6.4 g/dL (ref 6.0–8.5)

## 2017-03-03 LAB — LIPID PANEL
Chol/HDL Ratio: 1.6 ratio (ref 0.0–5.0)
Cholesterol, Total: 118 mg/dL (ref 100–199)
HDL: 75 mg/dL (ref >39)
LDL Calculated: 31 mg/dL (ref 0–99)
Triglycerides: 58 mg/dL (ref 0–149)
VLDL CHOLESTEROL CAL: 12 mg/dL (ref 5–40)

## 2017-03-16 ENCOUNTER — Ambulatory Visit (INDEPENDENT_AMBULATORY_CARE_PROVIDER_SITE_OTHER): Payer: Medicare Other | Admitting: *Deleted

## 2017-03-16 DIAGNOSIS — I495 Sick sinus syndrome: Secondary | ICD-10-CM

## 2017-03-16 LAB — CUP PACEART REMOTE DEVICE CHECK
Battery Remaining Longevity: 162 mo
Brady Statistic AP VS Percent: 59.44 %
Date Time Interrogation Session: 20190305142142
Implantable Lead Implant Date: 20180530
Implantable Lead Implant Date: 20180530
Implantable Lead Location: 753860
Implantable Lead Model: 5076
Lead Channel Pacing Threshold Amplitude: 0.625 V
Lead Channel Pacing Threshold Pulse Width: 0.4 ms
Lead Channel Sensing Intrinsic Amplitude: 18.75 mV
Lead Channel Sensing Intrinsic Amplitude: 2.5 mV
Lead Channel Sensing Intrinsic Amplitude: 2.5 mV
Lead Channel Setting Pacing Amplitude: 2 V
MDC IDC LEAD LOCATION: 753859
MDC IDC MSMT BATTERY VOLTAGE: 3.12 V
MDC IDC MSMT LEADCHNL RA IMPEDANCE VALUE: 285 Ohm
MDC IDC MSMT LEADCHNL RA IMPEDANCE VALUE: 399 Ohm
MDC IDC MSMT LEADCHNL RV IMPEDANCE VALUE: 361 Ohm
MDC IDC MSMT LEADCHNL RV IMPEDANCE VALUE: 437 Ohm
MDC IDC MSMT LEADCHNL RV PACING THRESHOLD AMPLITUDE: 0.75 V
MDC IDC MSMT LEADCHNL RV PACING THRESHOLD PULSEWIDTH: 0.4 ms
MDC IDC MSMT LEADCHNL RV SENSING INTR AMPL: 18.75 mV
MDC IDC PG IMPLANT DT: 20180530
MDC IDC SET LEADCHNL RA PACING AMPLITUDE: 1.5 V
MDC IDC SET LEADCHNL RV PACING PULSEWIDTH: 0.4 ms
MDC IDC SET LEADCHNL RV SENSING SENSITIVITY: 2 mV
MDC IDC STAT BRADY AP VP PERCENT: 0.03 %
MDC IDC STAT BRADY AS VP PERCENT: 0.03 %
MDC IDC STAT BRADY AS VS PERCENT: 40.51 %
MDC IDC STAT BRADY RA PERCENT PACED: 60.89 %
MDC IDC STAT BRADY RV PERCENT PACED: 0.05 %

## 2017-03-16 NOTE — Progress Notes (Signed)
Remote pacemaker transmission.   

## 2017-03-17 ENCOUNTER — Encounter: Payer: Self-pay | Admitting: Cardiology

## 2017-05-11 ENCOUNTER — Encounter: Payer: Self-pay | Admitting: Podiatry

## 2017-05-11 ENCOUNTER — Ambulatory Visit (INDEPENDENT_AMBULATORY_CARE_PROVIDER_SITE_OTHER): Payer: Medicare Other | Admitting: Podiatry

## 2017-05-11 DIAGNOSIS — Q828 Other specified congenital malformations of skin: Secondary | ICD-10-CM | POA: Diagnosis not present

## 2017-05-11 DIAGNOSIS — B351 Tinea unguium: Secondary | ICD-10-CM

## 2017-05-11 DIAGNOSIS — M79676 Pain in unspecified toe(s): Secondary | ICD-10-CM | POA: Diagnosis not present

## 2017-05-11 NOTE — Progress Notes (Signed)
He presents today chief complaint of painful elongated toenails and painful calluses bilateral.  Objective: Pulses are palpable.  Nails are long thickened dystrophic and mycotic multiple porokeratotic lesions plantar aspect of bilateral foot.  Assessment: Pain in limbs and colitis.  Plan: Debridement of toenails 1 through 5 bilateral calves are secondary PEG removal reactive hyperkeratosis in place padding.

## 2017-05-19 ENCOUNTER — Other Ambulatory Visit: Payer: Self-pay | Admitting: Cardiovascular Disease

## 2017-05-19 NOTE — Telephone Encounter (Signed)
Rx(s) sent to pharmacy electronically.  

## 2017-05-25 ENCOUNTER — Encounter: Payer: Self-pay | Admitting: Cardiovascular Disease

## 2017-05-25 ENCOUNTER — Ambulatory Visit (INDEPENDENT_AMBULATORY_CARE_PROVIDER_SITE_OTHER): Payer: Medicare Other | Admitting: Cardiovascular Disease

## 2017-05-25 VITALS — BP 140/60 | HR 51 | Ht 71.0 in | Wt 165.4 lb

## 2017-05-25 DIAGNOSIS — I10 Essential (primary) hypertension: Secondary | ICD-10-CM | POA: Diagnosis not present

## 2017-05-25 DIAGNOSIS — I495 Sick sinus syndrome: Secondary | ICD-10-CM

## 2017-05-25 DIAGNOSIS — I2510 Atherosclerotic heart disease of native coronary artery without angina pectoris: Secondary | ICD-10-CM

## 2017-05-25 DIAGNOSIS — E119 Type 2 diabetes mellitus without complications: Secondary | ICD-10-CM | POA: Diagnosis not present

## 2017-05-25 DIAGNOSIS — E785 Hyperlipidemia, unspecified: Secondary | ICD-10-CM | POA: Diagnosis not present

## 2017-05-25 DIAGNOSIS — Z950 Presence of cardiac pacemaker: Secondary | ICD-10-CM

## 2017-05-25 NOTE — Progress Notes (Signed)
Cardiology Office Note    Date:  05/25/2017   ID:  Travis [name], DOB [birth year], MRN [id number]  PCP:  Leeroy Cha, MD  Referring M.D.: Dr. Leeroy Cha  Cardiologist:  Shelva Majestic, MD   Chief Complaint  Patient presents with  . Follow-up    History of Present Illness:  Travis [name] is a 77 y.o. male who was initially referred through the courtesy of Travis [name] for evaluation of lightheadedness and dizziness while running.  He presents to the office today in follow-up of his pacemaker and cardiac catheterization.  Travis [name] has been active for his entire life.  He has a history of hypertension, hyperlipidemia, and has been diagnosed with type 2 diabetes mellitus for 14 years.  He exercises at least 6 days per week for 1-2 hours.  He does resistance training, spin classes, and typically runs anywhere from 5-6 miles at a 9-10 minute mile pace and has been doing so for many years.  His pulse typically runs in the low 30s.  Recently, he has begun to notice that he becomes dizzy when he starts to run.  This is associated with mild shortness of breath.  He denies any chest pain.  The symptoms have occurred since January.  He denies any frank syncope.  He denies the lightheaded symptoms when he is at rest and his symptoms only occur with significant activity.  When he does spin class he is unaware of any  symptoms.    When I saw for initial evaluation, his ECG revealed marked sinus bradycardia with ventricular rate averaging 37 bpm.  PR interval was 190 ms and QTc interval was 335 ms.  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  He had a 2/6 systolic murmur on physical exam, suggestive of possible aortic valve disease.  I scheduled him for a 2-D echo Doppler study which was done on 06/02/2016.  This showed an ejection fraction at 55-65%.  There was mild LVH.  He did not have any regional wall motion  abnormalities.  His aortic valve was reported as structurally normal.  There was a mean gradient of 9 and a peak gradient of 17.  There was mild MR and mild LA dilation.  His right atrium was mild moderately dilated.  I obtain a magnesium level, which was normal.  I scheduled him for an exercise nuclear stress test to assess both chronotropic competence to exercise as well as myocardial perfusion.  On his exercise portion.  His resting heart rate was 34 bpm, which rose to a maximum of 126/m.  He completed 2 minutes and 2 seconds into stage III of Bruce protocol.  He was able to augment his heart rate appropriately with exercise.  At the end of stage I reflective of good aerobic conditioning ventricular rate was 71, at the end of stage II 100, and in stage III maximum heart rate was 125.  He was entirely asymptomatic but had an exaggerated blood pressure response opted to 16/75 and at peak stress developed asymptomatic ST segment depression up to 3 mm in the inferior and inferolateral leads.  There was a very small mild distal anteroseptal defect which most likely was artifact.  There was no ischemia identified.  He has been wearing an event monitor.  We werereceivingdaily notifications demonstrating marked bradycardia with heart rates in the 30s.  These have been sinus rhythm.  He has been entirely asymptomatic.  He had developed  sinus pauses up to 5 - 6 seconds.  He remained entirely asymptomatic and specifically denied any dizziness or lightheadedness or chest pain.  He was unaware of sleep apnea but he does snore.    When I saw him in follow-up on 06/04/2016.  I had extensive discussion with him, both concerning his ST segment changes on ECG during stress testing as well as his need for a permanent pacemaker.  Due to scheduling difficulties and since he remained asymptomatic, ultimately, both of these procedures were done on 06/10/2016.  His diagnostic catheterization was done by Dr. Tiney Rouge I was unable to  be in the hospital that day.  I reviewed the angiographic findings.  He had normal LV function.  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  There was mild 30 and 40% smooth circumflex narrowings.  Later that same day in the afternoon he underwent successful insertion of a permanent pacemaker by Dr. 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  He tolerated both procedures well.  Doing his pacemaker insertion, he has continued to run and has been without dizziness or lightheadedness. He saw Dr. Sallyanne Kuster on 09/15/2016 for pacemaker follow-up evaluation.  He had normal device function.  He is now scheduled to undergo remote download every 3 months and have yearly office visits with Dr. Sallyanne Kuster.  and have yearly office visits with Dr. Sallyanne Kuster.  His blood pressure at that time was elevated his blood pressure at that time was elevated He is now scheduled to undergo remote downloads every 3 months  He had normal device function.  And no longer experiences any dizziness or lightheadedness.  He saw Dr. Sallyanne Kuster on September 15, 2016 for pacemaker follow-up evaluation.   His blood pressure that day was elevated and his lisinopril was increased to 20 mg and he continued to be on amlodipine.  He feels his blood pressure has been better.  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  I last saw him, he has been running approximately 30 miles per week.  He denies chest pain PND orthopnea.  When I last saw him, I added Zetia to his regimen of atorvastatin.  This is resulted in marked improvement in lipid status and blood work done by his primary physician on March 03, 2017 showed an LDL cholesterol at 31.  He is on lisinopril 20 mg and amlodipine 5 mg for hypertension.  He is on  metformin for mild type 2 diabetes mellitus.  Recent hemoglobin A1c was 6.3.  He has had some mild knee discomfort and for this reason has elected not to participate in the nationals and running in June.  As I saw him, he also underwent urologic evaluation with Dr. Sandrea Matte.  His PSA has been stable.  He presents for evaluation.  Past Medical History:  Diagnosis Date  . Heart murmur    "an innocent one" (06/10/2016)  . History of blood transfusion ~ 1956   "while I was in hospital; don't remember why"  . History of kidney stones   . Hyperlipidemia   . Hypertension   . 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 SSS (sick sinus syndrome) ([place]) 06/10/2016  . Type II diabetes mellitus ([place])     Past Surgical History:  Procedure Laterality  Date  . APPENDECTOMY    . CARDIAC CATHETERIZATION  06/10/2016  . CATARACT EXTRACTION W/ INTRAOCULAR LENS IMPLANT Right   . CYSTOSCOPY W/ STONE MANIPULATION    . INSERT / REPLACE / REMOVE PACEMAKER  06/10/2016   dual chamber permanent pacemaker  . KNEE ARTHROSCOPY W/ MENISCECTOMY Bilateral   . LAPAROSCOPIC CHOLECYSTECTOMY    . LEFT HEART CATH AND CORONARY ANGIOGRAPHY N/A 06/10/2016   Procedure: Left Heart Cath and Coronary Angiography;  Surgeon: Jolaine Artist, MD;  Location: [facility];  Service: Cardiovascular;  Laterality: N/A;  . LITHOTRIPSY    . PACEMAKER IMPLANT N/A 06/10/2016   Procedure: Pacemaker Implant;  Surgeon: Sanda Klein, MD;  Location: [facility];  Service: Cardiovascular;  Laterality: N/A;  . TONSILLECTOMY      Current Medications: Outpatient Medications Prior to Visit  Medication Sig Dispense Refill  . amLODipine (NORVASC) 5 MG tablet TAKE 1 TABLET BY MOUTH DAILY. 90 tablet 3  . aspirin EC 81 MG tablet Take 81 mg by mouth daily.    Marland Kitchen atorvastatin (LIPITOR) 40 MG tablet Take 1 tablet (40 mg total) by mouth daily. 90 tablet 0  . Cholecalciferol (VITAMIN D3) 2000 units capsule Take 2,000 Units by mouth  daily.    . COMBIGAN 0.2-0.5 % ophthalmic solution Place 1 drop into both eyes 2 (two) times daily.     Marland Kitchen lisinopril (PRINIVIL,ZESTRIL) 20 MG tablet Take 1 tablet (20 mg total) by mouth daily. 90 tablet 3  . metFORMIN (GLUCOPHAGE) 500 MG tablet Take 500 mg by mouth 2 (two) times daily with a meal.     . ezetimibe (ZETIA) 10 MG tablet Take 1 tablet (10 mg total) daily by mouth. 90 tablet 3  . amLODipine (NORVASC) 5 MG tablet Take 1 tablet (5 mg total) by mouth daily. 90 tablet 1  . amLODipine (NORVASC) 5 MG tablet Take 5 mg daily by mouth.     No facility-administered medications prior to visit.      Allergies:   Patient has no known allergies.   Social History   Socioeconomic History  . Marital status: Married    Spouse name: Not on file  . Number of children: Not on file  . Years of education: Not on file  . Highest education level: Not on file  Occupational History  . Not on file  Social Needs  . Financial resource strain: Not on file  . Food insecurity:    Worry: Not on file    Inability: Not on file  . Transportation needs:    Medical: Not on file    Non-medical: Not on file  Tobacco Use  . Smoking status: Former Smoker    Packs/day: 0.50    Years: 25.00    Pack years: 12.50    Types: Cigarettes    Last attempt to quit: 1983    Years since quitting: 36.3  . Smokeless tobacco: Never Used  Substance and Sexual Activity  . Alcohol use: Yes    Comment: 06/10/2016 "might have 2 beers/month"  . Drug use: No  . Sexual activity: Not on file  Lifestyle  . Physical activity:    Days per week: Not on file    Minutes per session: Not on file  . Stress: Not on file  Relationships  . Social connections:    Talks on phone: Not on file    Gets together: Not on file    Attends religious service: Not on file    Active  member of club or organization: Not on file    Attends meetings of clubs or organizations: Not on file    Relationship status: Not on file  Other Topics  Concern  . Not on file  Social History Narrative  . Not on file    Social history is notable in that he was born in [place].  He's been married for 54 years.  He has 2 children, 7 grandchildren, and one great-grandchild.  He has a Scientist, water quality in Theatre stage manager.  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  He is retired.  He drinks occasional beer.  He does not smoke.   Family History:  The patient's family history includes Colon cancer in his father..  He has a brother age 53 and a sister age 79, who are healthy.   ROS General: Negative; No fevers, chills, or night sweats;  HEENT: Negative; No changes in vision or hearing, sinus congestion, difficulty swallowing Pulmonary: Negative; No cough, wheezing, shortness of breath, hemoptysis Cardiovascular:  See HPI GI: Negative; No nausea, vomiting, diarrhea, or abdominal pain GU: Negative; No dysuria, hematuria, or difficulty voiding Musculoskeletal: Negative; no myalgias, joint pain, or weakness Hematologic/Oncology: Negative; no easy bruising, bleeding Endocrine: Negative; no heat/cold intolerance; no diabetes Neuro: Negative; no changes in balance, headaches Skin: Negative; No rashes or skin lesions Psychiatric: Negative; No behavioral problems, depression Sleep: Positive for snoring, no daytime sleepiness, hypersomnolence, bruxism, restless legs, hypnogognic hallucinations, no cataplexy Other comprehensive 14 point system review is negative.   PHYSICAL EXAM:   VS:  BP 140/60   Pulse (!) 51   Ht _0  (1.803 m)   Wt 165 lb 6.4 oz (75 kg)   BMI 23.07 kg/m     Repeat blood pressure by me was 144/64 supine and 148/66 standing  Wt Readings from Last 3 Encounters:  05/25/17 165 lb 6.4 oz (75 kg)  11/24/16 157 lb (71.2 kg)  09/15/16 164 lb (74.4 kg)    General: Alert, oriented, no distress.  Skin: normal turgor, no rashes, warm and dry HEENT: Normocephalic, atraumatic. Pupils equal round and reactive  to light; sclera anicteric; extraocular muscles intact;  Nose without nasal septal hypertrophy Mouth/Parynx benign; Mallinpatti scale 2 Neck: No JVD, no carotid bruits; normal carotid upstroke Lungs: clear to ausculatation and percussion; no wheezing or rales Chest wall: without tenderness to palpitation Heart: PMI not displaced, RRR, s1 s2 normal, 1/6 systolic murmur, no diastolic murmur, no rubs, gallops, thrills, or heaves Abdomen: soft, nontender; no hepatosplenomehaly, BS+; abdominal aorta nontender and not dilated by palpation. Back: no CVA tenderness Pulses 2+ Musculoskeletal: full range of motion, normal strength, no joint deformities Extremities: no clubbing cyanosis or edema, Homan's sign negative  Neurologic: grossly nonfocal; Cranial nerves grossly wnl Psychologic: Normal mood and affect   Studies/Labs Reviewed:   ECG (independently read by me): Atrial paced rhythm at 51 bpm.  Prolonged AV conduction with a PR interval of 242 ms.  QTc interval 383 ms.  November 24, 2016 EKG:  EKG is ordered today.  A paced rhythm at 51 bpm.  PR interval 222 ms.  Mild LVH.  QTc interval 372 ms  06/16/2016 ECG (independently read by me): Atrially paced rhythm at 50 bpm with prolonged AV conduction with a PR interval at 214 ms.  06/04/2016 ECG (independently read by me): Marked sinus bradycardia with possible low atrial involvement.  PR interval 166 ms, QTc interval 335 ms.  Ventricular rate 36.  05/20/2016 ECG (  independently read by me): Marked sinus bradycardia with sinus arrhythmia, ventricular rate in the 40s to upper 30s, average 37.  PR interval 190 ms.  QTc interval 335 ms.  No ST segment changes.  Recent Labs: BMP Latest Ref Rng & Units 03/03/2017 11/27/2016 11/18/2016  Glucose 65 - 99 mg/dL 119(H) 108(H) 105(H)  BUN 8 - 27 mg/dL 22 23 30(H)  Creatinine 0.76 - 1.27 mg/dL 1.13 1.12 1.16  BUN/Creat Ratio 10 - _0 26(H)  Sodium 134 - 144 mmol/L 140 138 139  Potassium 3.5 - 5.2  mmol/L 4.7 4.2 5.8(H)  Chloride 96 - 106 mmol/L 106 98 103  CO2 20 - 29 mmol/L _1 Calcium 8.6 - 10.2 mg/dL 10.2 10.2 10.3(H)     Hepatic Function Latest Ref Rng & Units 03/03/2017 11/17/2016  Total Protein 6.0 - 8.5 g/dL 6.4 7.1  Albumin 3.5 - 4.8 g/dL 4.5 4.8  AST 0 - 40 IU/L 19 20  ALT 0 - 44 IU/L 21 19  Alk Phosphatase 39 - 117 IU/L 62 82  Total Bilirubin 0.0 - 1.2 mg/dL 1.2 1.3(H)    CBC Latest Ref Rng & Units 06/04/2016  WBC 3.4 - 10.8 x10E3/uL 7.1  Hemoglobin 13.0 - 17.7 g/dL 14.2  Hematocrit 37.5 - 51.0 % 41.9  Platelets 150 - 379 x10E3/uL 260   Lab Results  Component Value Date   MCV 96 06/04/2016   No results found for: TSH Lab Results  Component Value Date   HGBA1C 6.3 (H) 11/17/2016     BNP No results found for: BNP  ProBNP No results found for: PROBNP   Lipid Panel     Component Value Date/Time   CHOL 118 03/03/2017 0807   TRIG 58 03/03/2017 0807   HDL 75 03/03/2017 0807   CHOLHDL 1.6 03/03/2017 0807   LDLCALC 31 03/03/2017 0807     RADIOLOGY: No results found.   Additional studies/ records that were reviewed today include:  I reviewed the recent records from [facility]. I have reviewed his 2-D echo Doppler study, exercise Myoview study, laboratory, as well as daily  Monitors with critical notification from the event monitor prior to the insertion of the pacemaker.  I personally reviewed the catheterization findings, as well as his pacemaker implantation report.   His  office visit with Dr. Sallyanne Kuster was reviewed as well as his most recent laboratory from 03/03/17.   ASSESSMENT:    1. SSS (sick sinus syndrome) ([place])   2. Coronary artery disease involving native coronary artery of native heart without angina pectoris   3. Pacemaker   4. Essential hypertension   5. Hyperlipidemia, unspecified hyperlipidemia type   6. Diabetes mellitus without complication [facility])      PLAN:  Mr. Orestes Geiman is a very pleasant, active and  healthy-appearing 77 year old gentleman who has a long-standing history of significant exercise and has been exercising for at least 1-2 hours per day 6 days per week.  He often runs 5-6 miles at a time and does spin class. His aerobic conditioning has attributed to his sinus bradycardia.  When I initially saw him he had gone to noticed dizziness early portion of his run.  A 2-D echo Doppler study revealed mild LVH with normal systolic function with an EF of 55-60%.  He did not have significant valvular aortic stenosis and had normal excursion, although a mild gradient was noted.  He ws demonstrated to have chronotropic competence and was able to increase his heart rate  to 125 bpm.  During stage III of the standard Bruce protocol he developed asymptomatic ST segment depression concerning for potential ischemia with up to 3 mm apart resolved inferolateral ST segment changes.  Perfusion  imaging was fairly normal with probable mild apical defect, more consistent with artifact rather than scar or ischemia.  The catheterization performed prior to pacemaker implantation showed 60 to 70% calcific LAD stenosis with normal EF.    His pacemaker was inserted and since that time he has felt significantly improved and is able to run faster without experiencing the previous episodes of mild lightheadedness that he had experienced.  I last saw him, his LDL cholesterol was 70.  I added Zetia 10 mg to atorvastatin 40 mg.  Subsequent laboratory has shown dramatic response LDL cholesterol now at 31.  I again reviewed with him for recurrent regression of coronary plaque.  I recommended he continue to stay on atorvastatin 40 and Zetia 10 mg and with an LDL of 31 this should hopefully induce plaque regression and prevent any progression.  His blood pressure today is upper normal on morphine 5 mg and Zetia.  I recommended that he continue to monitor his blood pressure.  If his blood pressure continues to be consistently greater than 140  further medication titration may be necessary.  He is ECG today shows 100% atrial pacing with ventricular rate at 51 bpm.  He will have a remote pacemaker check on June 4.  He will see Dr. Sallyanne Kuster for one-year pacemaker evaluation in September.  I will see him in December 2019 for follow-up evaluation.  He will continue taking metformin 500 mg twice a day for his type 2 diabetes mellitus.  He will contact our office regarding if he notes persistent blood pressure elevation for medication adjustment.  I will see him in December for evaluation.   Medication Adjustments/Labs and Tests Ordered: Current medicines are reviewed at length with the patient today.  Concerns regarding medicines are outlined above.  Medication changes, Labs and Tests ordered today are listed in the Patient Instructions below. Patient Instructions  Medication Instructions:  Your physician recommends that you continue on your current medications as directed. Please refer to the Current Medication list given to you today.  Follow-Up: Your physician wants you to follow-up in: December with Dr. Claiborne Billings. You will receive a reminder letter in the mail two months in advance. If you don't receive a letter, please call our office to schedule the follow-up appointment.    If you need a refill on your cardiac medications before your next appointment, please call your pharmacy.      Signed, Shelva Majestic, MD  05/25/2017 8:32 AM    [facility] [address], [place], [place], [place]  [address] Phone: [phone]

## 2017-05-25 NOTE — Patient Instructions (Signed)
Medication Instructions:  Your physician recommends that you continue on your current medications as directed. Please refer to the Current Medication list given to you today.  Follow-Up: Your physician wants you to follow-up in: December with Dr. Tresa Endo. You will receive a reminder letter in the mail two months in advance. If you don't receive a letter, please call our office to schedule the follow-up appointment.    If you need a refill on your cardiac medications before your next appointment, please call your pharmacy.

## 2017-06-15 ENCOUNTER — Ambulatory Visit (INDEPENDENT_AMBULATORY_CARE_PROVIDER_SITE_OTHER): Payer: Medicare Other | Admitting: *Deleted

## 2017-06-15 DIAGNOSIS — I495 Sick sinus syndrome: Secondary | ICD-10-CM

## 2017-06-15 NOTE — Progress Notes (Signed)
Remote pacemaker transmission.   

## 2017-08-04 LAB — CUP PACEART REMOTE DEVICE CHECK
Brady Statistic AP VS Percent: 74.84 %
Brady Statistic AS VP Percent: 0.02 %
Brady Statistic RV Percent Paced: 0.05 %
Date Time Interrogation Session: 20190604054156
Implantable Lead Location: 753859
Implantable Lead Model: 5076
Lead Channel Impedance Value: 456 Ohm
Lead Channel Sensing Intrinsic Amplitude: 20.75 mV
Lead Channel Sensing Intrinsic Amplitude: 20.75 mV
Lead Channel Setting Pacing Amplitude: 1.5 V
Lead Channel Setting Pacing Amplitude: 2 V
Lead Channel Setting Pacing Pulse Width: 0.4 ms
Lead Channel Setting Sensing Sensitivity: 2 mV
MDC IDC LEAD IMPLANT DT: 20180530
MDC IDC LEAD IMPLANT DT: 20180530
MDC IDC LEAD LOCATION: 753860
MDC IDC MSMT BATTERY REMAINING LONGEVITY: 158 mo
MDC IDC MSMT BATTERY VOLTAGE: 3.08 V
MDC IDC MSMT LEADCHNL RA IMPEDANCE VALUE: 323 Ohm
MDC IDC MSMT LEADCHNL RA IMPEDANCE VALUE: 361 Ohm
MDC IDC MSMT LEADCHNL RA PACING THRESHOLD AMPLITUDE: 0.625 V
MDC IDC MSMT LEADCHNL RA PACING THRESHOLD PULSEWIDTH: 0.4 ms
MDC IDC MSMT LEADCHNL RA SENSING INTR AMPL: 2.5 mV
MDC IDC MSMT LEADCHNL RA SENSING INTR AMPL: 2.5 mV
MDC IDC MSMT LEADCHNL RV IMPEDANCE VALUE: 399 Ohm
MDC IDC MSMT LEADCHNL RV PACING THRESHOLD AMPLITUDE: 0.75 V
MDC IDC MSMT LEADCHNL RV PACING THRESHOLD PULSEWIDTH: 0.4 ms
MDC IDC PG IMPLANT DT: 20180530
MDC IDC STAT BRADY AP VP PERCENT: 0.03 %
MDC IDC STAT BRADY AS VS PERCENT: 25.1 %
MDC IDC STAT BRADY RA PERCENT PACED: 77.11 %

## 2017-08-10 ENCOUNTER — Encounter: Payer: Self-pay | Admitting: Podiatry

## 2017-08-10 ENCOUNTER — Ambulatory Visit (INDEPENDENT_AMBULATORY_CARE_PROVIDER_SITE_OTHER): Payer: Medicare Other | Admitting: Podiatry

## 2017-08-10 DIAGNOSIS — Q828 Other specified congenital malformations of skin: Secondary | ICD-10-CM | POA: Diagnosis not present

## 2017-08-10 DIAGNOSIS — M79676 Pain in unspecified toe(s): Secondary | ICD-10-CM | POA: Diagnosis not present

## 2017-08-10 DIAGNOSIS — B351 Tinea unguium: Secondary | ICD-10-CM

## 2017-08-10 DIAGNOSIS — E118 Type 2 diabetes mellitus with unspecified complications: Secondary | ICD-10-CM

## 2017-08-10 NOTE — Progress Notes (Signed)
He presents today chief complaint of painful elongated toenails and calluses to the plantar aspect of the bilateral foot.  Objective: Vital signs are stable he is alert and oriented x3.  Pulses are palpable.  Neurologic sensorium is intact deep tendon reflexes are intact porokeratotic lesions plantar aspect of the forefoot bilaterally.  Toenails are thick yellow dystrophic-like mycotic and painful palpation as well as debridement.  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  Plan: Debridement of all reactive hyperkeratotic lesions debridement of nails 1 through 5 bilateral cover service secondary to pain follow-up with me in 3 months

## 2017-08-24 ENCOUNTER — Telehealth: Payer: Self-pay | Admitting: Cardiovascular Disease

## 2017-08-24 NOTE — Telephone Encounter (Signed)
New Message    Patient is calling because he was advised that he needed labs done. Not showing an order for labs. Please call.

## 2017-08-24 NOTE — Telephone Encounter (Signed)
Returned pt call. lmtb if assistance is still needed

## 2017-08-25 NOTE — Telephone Encounter (Signed)
LMTCB

## 2017-08-26 NOTE — Telephone Encounter (Signed)
Lm for pt to call back ./cy 

## 2017-09-14 ENCOUNTER — Ambulatory Visit (INDEPENDENT_AMBULATORY_CARE_PROVIDER_SITE_OTHER): Payer: Medicare Other | Admitting: *Deleted

## 2017-09-14 DIAGNOSIS — I495 Sick sinus syndrome: Secondary | ICD-10-CM | POA: Diagnosis not present

## 2017-09-14 NOTE — Progress Notes (Signed)
Remote pacemaker transmission.   

## 2017-09-19 ENCOUNTER — Other Ambulatory Visit: Payer: Self-pay | Admitting: Cardiovascular Disease

## 2017-09-20 ENCOUNTER — Telehealth: Payer: Self-pay | Admitting: Cardiovascular Disease

## 2017-09-20 NOTE — Telephone Encounter (Signed)
He could take lasix 20 mg daily as needed for swelling. Based on prior history I don't think he would need this much. Continue to restrict sodium intake.  Saoirse Legere [place] MD, [facility]

## 2017-09-20 NOTE — Telephone Encounter (Signed)
I agree with Dr. [place]. Take furosemide 20 mg daily until swelling resolves and then take only intermittently, as needed. Will see how this works between now and his 9/25 appt. MCr

## 2017-09-20 NOTE — Telephone Encounter (Signed)
New Message          Pt c/o swelling: STAT is pt has developed SOB within 24 hours  1) How much weight have you gained and in what time span?   2) If swelling, where is the swelling located? Lower legs/ankle  3) Are you currently taking a fluid pill? No  4) Are you currently SOB? No  5) Do you have a log of your daily weights (if so, list)? No   6) Have you gained 3 pounds in a day or 5 pounds in a week? Yes  7) Have you traveled recently? No           Patient would like a call back concerning this issues

## 2017-09-20 NOTE — Telephone Encounter (Signed)
Returned call to patient of Dr. Kelly/Dr. Royann Shivers. He reports swelling in lower legs/ankles for about 1 week. He reports the swelling was previously on and off but it is now consistent. He also reports some swelling in his arms. He denies weight gain, he denies shortness of breath. He tries to monitor salt/sodium intake but does not monitor it as much as he should. He has not traveled in the last 2+ weeks or so.   Patient does not take diuretic but was previously on lisinopril-hctz and is just now on lisinopril.   Patient has device appt with Dr. Salena Saner 9/25 and ROV with Dr. Tresa Endo in Dec     Patient also states he needs a lab appointment for blood work about 2 weeks prior to Dec 2019 visit. There are no notes about this per May visit. Will route to primary RN

## 2017-09-21 ENCOUNTER — Encounter (HOSPITAL_BASED_OUTPATIENT_CLINIC_OR_DEPARTMENT_OTHER): Payer: Self-pay | Admitting: Emergency Medicine

## 2017-09-21 ENCOUNTER — Other Ambulatory Visit: Payer: Self-pay

## 2017-09-21 ENCOUNTER — Emergency Department (HOSPITAL_BASED_OUTPATIENT_CLINIC_OR_DEPARTMENT_OTHER)
Admission: EM | Admit: 2017-09-21 | Discharge: 2017-09-21 | Disposition: A | Discharge status: Home / Self Care | Payer: Medicare Other | Attending: Emergency Medicine | Admitting: Emergency Medicine

## 2017-09-21 DIAGNOSIS — Z950 Presence of cardiac pacemaker: Secondary | ICD-10-CM | POA: Diagnosis not present

## 2017-09-21 DIAGNOSIS — R600 Localized edema: Secondary | ICD-10-CM | POA: Diagnosis not present

## 2017-09-21 DIAGNOSIS — L03114 Cellulitis of left upper limb: Secondary | ICD-10-CM | POA: Insufficient documentation

## 2017-09-21 DIAGNOSIS — I2510 Atherosclerotic heart disease of native coronary artery without angina pectoris: Secondary | ICD-10-CM | POA: Insufficient documentation

## 2017-09-21 DIAGNOSIS — I10 Essential (primary) hypertension: Secondary | ICD-10-CM | POA: Diagnosis not present

## 2017-09-21 DIAGNOSIS — Z23 Encounter for immunization: Secondary | ICD-10-CM | POA: Insufficient documentation

## 2017-09-21 DIAGNOSIS — E119 Type 2 diabetes mellitus without complications: Secondary | ICD-10-CM | POA: Insufficient documentation

## 2017-09-21 DIAGNOSIS — Z87891 Personal history of nicotine dependence: Secondary | ICD-10-CM | POA: Insufficient documentation

## 2017-09-21 DIAGNOSIS — Z9181 History of falling: Secondary | ICD-10-CM | POA: Diagnosis not present

## 2017-09-21 MED ORDER — CEPHALEXIN 250 MG PO CAPS
500.0000 mg | ORAL_CAPSULE | Freq: Once | ORAL | Status: AC
  Administered 2017-09-21: 500 mg via ORAL
  Filled 2017-09-21: qty 2

## 2017-09-21 MED ORDER — TETANUS-DIPHTH-ACELL PERTUSSIS 5-2.5-18.5 LF-MCG/0.5 IM SUSP
0.5000 mL | Freq: Once | INTRAMUSCULAR | Status: AC
  Administered 2017-09-21: 0.5 mL via INTRAMUSCULAR
  Filled 2017-09-21: qty 0.5

## 2017-09-21 MED ORDER — CEPHALEXIN 500 MG PO CAPS: 500.0000 mg | ORAL_CAPSULE | Freq: Four times a day (QID) | ORAL | 0 refills | Status: DC

## 2017-09-21 MED ORDER — FUROSEMIDE 20 MG PO TABS: 20.0000 mg | ORAL_TABLET | Freq: Every day | ORAL | 0 refills | Status: DC | PRN

## 2017-09-21 MED FILL — CEPHALEXIN 500 MG CAPSULE: 500 | 7 days supply | Qty: 28 | Fill #0

## 2017-09-21 NOTE — ED Provider Notes (Signed)
Emergency Department Provider Note   I have reviewed the triage vital signs and the nursing notes.   HISTORY  Chief Complaint Arm Injury and Leg Swelling   HPI Travis [name] is a 77 y.o. male with multiple medical problems as documented below the presents to the emergency department today secondary to 2 complaints.  Patient states that he was running last week and had a mechanical fall landing on his left elbow causing significant abrasions to that area.  He states that they seem to have improved a little bit since then but has persistent swelling, redness and pain in that area even while using antibiotic ointment and keeping area clean and dry.  He also states that he has had lower extremity swelling for approximately 10 days.  He had an episode or 2 of this before but this is not a persistent problem.  He discussed with cardiology who wants him to take Lasix daily as needed until the swelling improves and then they will follow-up with him in a couple weeks.  He does not have any shortness of breath or chest pain at this time.  He also states that the swelling actually does seem to be a little bit better today than it was before.  And is not asymmetric.Marland Kitchen No other associated or modifying symptoms.    Past Medical History:  Diagnosis Date  . Heart murmur    "an innocent one" (06/10/2016)  . History of blood transfusion ~ 1956   "while I was in hospital; don't remember why"  . History of kidney stones   . Hyperlipidemia   . Hypertension   . 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 SSS (sick sinus syndrome) (HCC) 06/10/2016  . Type II diabetes mellitus [facility])     Patient Active Problem List   Diagnosis Date Noted  . CAD (coronary artery disease), native coronary artery 09/14/2016  . Pacemaker 06/10/2016  . Paroxysmal atrial fibrillation (HCC) 06/07/2016  . Sinus pause 06/07/2016  . Bradycardia 06/04/2016  . Groin pain 06/29/2014  . Piriformis syndrome of left side  10/30/2013    Past Surgical History:  Procedure Laterality Date  . APPENDECTOMY    . CARDIAC CATHETERIZATION  06/10/2016  . CATARACT EXTRACTION W/ INTRAOCULAR LENS IMPLANT Right   . CYSTOSCOPY W/ STONE MANIPULATION    . INSERT / REPLACE / REMOVE PACEMAKER  06/10/2016   dual chamber permanent pacemaker  . KNEE ARTHROSCOPY W/ MENISCECTOMY Bilateral   . LAPAROSCOPIC CHOLECYSTECTOMY    . LEFT HEART CATH AND CORONARY ANGIOGRAPHY N/A 06/10/2016   Procedure: Left Heart Cath and Coronary Angiography;  Surgeon: Dolores Patty, MD;  Location: [facility] INVASIVE CV LAB;  Service: Cardiovascular;  Laterality: N/A;  . LITHOTRIPSY    . PACEMAKER IMPLANT N/A 06/10/2016   Procedure: Pacemaker Implant;  Surgeon: Thurmon Fair, MD;  Location: [facility] INVASIVE CV LAB;  Service: Cardiovascular;  Laterality: N/A;  . TONSILLECTOMY      Current Outpatient Rx  . Order #: [id number] Class: Normal  . Order #: [id number] Class: Historical Med  . Order #: [id number] Class: Normal  . Order #: [id number] Class: Print  . Order #: [id number] Class: Historical Med  . Order #: [id number] Class: Historical Med  . Order #: [id number] Class: Normal  . Order #: [id number] Class: Normal  . Order #: [id number] Class: Normal  . Order #: [id number] Class: Historical Med    Allergies Patient has no known allergies.  Family History  Problem Relation Age of Onset  . Colon cancer Father  Social History Social History   Tobacco Use  . Smoking status: Former Smoker    Packs/day: 0.50    Years: 25.00    Pack years: 12.50    Types: Cigarettes    Last attempt to quit: 1983    Years since quitting: 36.7  . Smokeless tobacco: Never Used  Substance Use Topics  . Alcohol use: Yes    Comment: 06/10/2016 "might have 2 beers/month"  . Drug use: No    Review of Systems  All other systems negative except as documented in the HPI. All pertinent positives and negatives as reviewed in the  HPI. ____________________________________________   PHYSICAL EXAM:  VITAL SIGNS: ED Triage Vitals  Enc Vitals Group     BP 09/21/17 1107 (!) 172/75     Pulse Rate 09/21/17 1107 (!) 51     Resp 09/21/17 1107 18     Temp 09/21/17 1107 98.6 F (37 C)     Temp Source 09/21/17 1107 Oral     SpO2 09/21/17 1107 99 %     Weight 09/21/17 1105 160 lb (72.6 kg)     Height 09/21/17 1105 5\' 11"  (1.803 m)    Constitutional: Alert and oriented. Well appearing and in no acute distress. Eyes: Conjunctivae are normal. PERRL. EOMI. Head: Atraumatic. Nose: No congestion/rhinnorhea. Mouth/Throat: Mucous membranes are moist.  Oropharynx non-erythematous. Neck: No stridor.  No meningeal signs.   Cardiovascular: bradycardic rate, regular rhythm. Good peripheral circulation. Grossly normal heart sounds.   Respiratory: Normal respiratory effort.  No retractions. Lungs CTAB. Gastrointestinal: Soft and nontender. No distention.  Musculoskeletal: No lower extremity tenderness but has BLE 1+ pitting edema to knees. No gross deformities of extremities. Neurologic:  Normal speech and language. No gross focal neurologic deficits are appreciated.  Skin: Has abrasions on his left arm that have what appears to be some purulent drainage on top of them.  Also has surrounding erythema and edema by approximately 3 cm and its warm to touch.  ____________________________________________   INITIAL IMPRESSION / ASSESSMENT AND PLAN / ED COURSE  Suspect patient likely has cellulitis secondary to the wound on her arm.  Tdap updated. Wound care. Antibiotics started Patient has recurrent lower extremity swelling but has no shortness of breath or chest pain.  His blood pressure is a little bit high takes blood pressure medications at home and just recently got started on Lasix which she has not started.  No evidence of acute respiratory distress or respiratory failure indicating need for work-up or admission to the hospital.   Patient prefers trying the Lasix to see if the lower extremity swelling improves and following up with cardiology but will return here if any worsening symptoms prior to that.     Pertinent labs & imaging results that were available during my care of the patient were reviewed by me and considered in my medical decision making (see chart for details).  ____________________________________________  FINAL CLINICAL IMPRESSION(S) / ED DIAGNOSES  Final diagnoses:  Leg edema  Cellulitis of left upper extremity     MEDICATIONS GIVEN DURING THIS VISIT:  Medications  Tdap (BOOSTRIX) injection 0.5 mL (has no administration in time range)  cephALEXin (KEFLEX) capsule 500 mg (500 mg Oral Given 09/21/17 1205)     NEW OUTPATIENT MEDICATIONS STARTED DURING THIS VISIT:  Current Discharge Medication List    START taking these medications   Details  cephALEXin (KEFLEX) 500 MG capsule Take 1 capsule (500 mg total) by mouth 4 (four) times daily. Qty: 28  capsule, Refills: 0        Note:  This note was prepared with assistance of Dragon voice recognition software. Occasional wrong-word or sound-a-like substitutions may have occurred due to the inherent limitations of voice recognition software.   Marily Memos, MD 09/21/17 [phone]

## 2017-09-21 NOTE — ED Notes (Signed)
ED Provider at bedside. 

## 2017-09-21 NOTE — ED Triage Notes (Signed)
Pt states he fell one week ago while running on the track injuring his L elbow. Reports the abrasion is not healing and he has had swelling to the elbow with mild pain. He also reports bilateral leg swelling for over a week. Denies SOB.

## 2017-09-21 NOTE — Telephone Encounter (Signed)
Patient called with MD recommendations. He voiced understanding, agrees w/plan. He reports swelling is somewhat better today.

## 2017-10-06 ENCOUNTER — Encounter: Payer: Self-pay | Admitting: Cardiovascular Disease

## 2017-10-06 ENCOUNTER — Ambulatory Visit (INDEPENDENT_AMBULATORY_CARE_PROVIDER_SITE_OTHER): Payer: Medicare Other | Admitting: Cardiovascular Disease

## 2017-10-06 VITALS — BP 124/72 | HR 54 | Ht 71.0 in | Wt 161.0 lb

## 2017-10-06 DIAGNOSIS — I10 Essential (primary) hypertension: Secondary | ICD-10-CM

## 2017-10-06 DIAGNOSIS — I495 Sick sinus syndrome: Secondary | ICD-10-CM

## 2017-10-06 DIAGNOSIS — I2510 Atherosclerotic heart disease of native coronary artery without angina pectoris: Secondary | ICD-10-CM

## 2017-10-06 DIAGNOSIS — R600 Localized edema: Secondary | ICD-10-CM

## 2017-10-06 DIAGNOSIS — Z950 Presence of cardiac pacemaker: Secondary | ICD-10-CM | POA: Diagnosis not present

## 2017-10-06 LAB — CUP PACEART REMOTE DEVICE CHECK
Battery Voltage: 3.06 V
Brady Statistic AP VP Percent: 0.04 %
Brady Statistic RA Percent Paced: 79.77 %
Brady Statistic RV Percent Paced: 0.05 %
Implantable Lead Implant Date: 20180530
Implantable Lead Location: 753860
Implantable Pulse Generator Implant Date: 20180530
Lead Channel Impedance Value: 342 Ohm
Lead Channel Impedance Value: 399 Ohm
Lead Channel Pacing Threshold Amplitude: 0.625 V
Lead Channel Pacing Threshold Pulse Width: 0.4 ms
Lead Channel Pacing Threshold Pulse Width: 0.4 ms
Lead Channel Setting Pacing Amplitude: 1.5 V
Lead Channel Setting Sensing Sensitivity: 2 mV
MDC IDC LEAD IMPLANT DT: 20180530
MDC IDC LEAD LOCATION: 753859
MDC IDC MSMT BATTERY REMAINING LONGEVITY: 153 mo
MDC IDC MSMT LEADCHNL RA IMPEDANCE VALUE: 266 Ohm
MDC IDC MSMT LEADCHNL RA IMPEDANCE VALUE: 323 Ohm
MDC IDC MSMT LEADCHNL RA SENSING INTR AMPL: 2.25 mV
MDC IDC MSMT LEADCHNL RA SENSING INTR AMPL: 2.25 mV
MDC IDC MSMT LEADCHNL RV PACING THRESHOLD AMPLITUDE: 0.875 V
MDC IDC MSMT LEADCHNL RV SENSING INTR AMPL: 17.375 mV
MDC IDC MSMT LEADCHNL RV SENSING INTR AMPL: 17.375 mV
MDC IDC SESS DTM: 20190903064414
MDC IDC SET LEADCHNL RV PACING AMPLITUDE: 2 V
MDC IDC SET LEADCHNL RV PACING PULSEWIDTH: 0.4 ms
MDC IDC STAT BRADY AP VS PERCENT: 78.58 %
MDC IDC STAT BRADY AS VP PERCENT: 0.01 %
MDC IDC STAT BRADY AS VS PERCENT: 21.37 %

## 2017-10-06 NOTE — Progress Notes (Signed)
Cardiology Office Note:    Date:  10/08/2017   ID:  Travis [name], DOB Sep 13, 1940, MRN [id number]  PCP:  Lorenda Ishihara, MD  Cardiologist: Nicki Guadalajara M.D.; Thurmon Fair, MD    Referring MD: Lorenda Ishihara,*   Chief Complaint  Patient presents with  Follow-up after pacemaker implantation  History of Present Illness:    Travis [name] is a 77 y.o. male with a hx of Hypertension and hyperlipidemia, without known coronary vascular problems to date, severe sinus bradycardia, periods of junctional escape rhythm and sinus pauses of up to 6 seconds in duration on an event monitor.   He underwent elective implantation of a dual-chamber Medtronic Azure XT permanent pacemaker on 06/10/2016. He had coronary angiography the same day which showed 65% mid LAD, 70% second diagonal, 30-40%stenoses in the mid circumflex  He last saw Dr. Tresa Endo in May of this year.  He had some problems with leg edema earlier in the month that resolved with a low dose of "as needed" diuretic.  He had some improvement in his energy level after we turned on his rate response since her last year, he still does not feel that he is at his full potential due to fatigue  Pacemaker interrogation shows excellent lead parameters and expected device longevity of 12.7 years. The heart rate histograms remain blunted. He has 74% atrial pacing and no ventricular pacing.  He has not had any significant episodes of mode switch or high ventricular rates.  Activity level is constant about 4 hours a day I made his ADL rate response a little more aggressive and increased the maximum ADL rate to 1200 bpm.    He has had a handful of episodes of atrial tachycardia with one-to-one conduction, never lasting more than a couple of seconds.  There is no evidence of atrial fibrillation.  Past Medical History:  Diagnosis Date  . Heart murmur    "an innocent one" (06/10/2016)  . History of blood transfusion ~ 1956   "while I was in  hospital; don't remember why"  . History of kidney stones   . Hyperlipidemia   . Hypertension   . 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 SSS (sick sinus syndrome) (HCC) 06/10/2016  . Type II diabetes mellitus (HCC)     Past Surgical History:  Procedure Laterality Date  . APPENDECTOMY    . CARDIAC CATHETERIZATION  06/10/2016  . CATARACT EXTRACTION W/ INTRAOCULAR LENS IMPLANT Right   . CYSTOSCOPY W/ STONE MANIPULATION    . INSERT / REPLACE / REMOVE PACEMAKER  06/10/2016   dual chamber permanent pacemaker  . KNEE ARTHROSCOPY W/ MENISCECTOMY Bilateral   . LAPAROSCOPIC CHOLECYSTECTOMY    . LEFT HEART CATH AND CORONARY ANGIOGRAPHY N/A 06/10/2016   Procedure: Left Heart Cath and Coronary Angiography;  Surgeon: Dolores Patty, MD;  Location: [facility] INVASIVE CV LAB;  Service: Cardiovascular;  Laterality: N/A;  . LITHOTRIPSY    . PACEMAKER IMPLANT N/A 06/10/2016   Procedure: Pacemaker Implant;  Surgeon: Thurmon Fair, MD;  Location: [facility] INVASIVE CV LAB;  Service: Cardiovascular;  Laterality: N/A;  . TONSILLECTOMY      Current Medications: Current Meds  Medication Sig  . amLODipine (NORVASC) 5 MG tablet TAKE 1 TABLET BY MOUTH DAILY.  Marland Kitchen aspirin EC 81 MG tablet Take 81 mg by mouth daily.  Marland Kitchen atorvastatin (LIPITOR) 40 MG tablet TAKE 1 TABLET ONCE DAILY.  Marland Kitchen Cholecalciferol (VITAMIN D3) 2000 units capsule Take 2,000 Units by mouth daily.  Elmer Sow  0.2-0.5 % ophthalmic solution Place 1 drop into both eyes 2 (two) times daily.   . metFORMIN (GLUCOPHAGE) 500 MG tablet Take 500 mg by mouth 2 (two) times daily with a meal.      Allergies:   Patient has no known allergies.   Social History   Socioeconomic History  . Marital status: Married    Spouse name: Not on file  . Number of children: Not on file  . Years of education: Not on file  . Highest education level: Not on file  Occupational History  . Not on file  Social Needs  . Financial resource strain: Not on file  .  Food insecurity:    Worry: Not on file    Inability: Not on file  . Transportation needs:    Medical: Not on file    Non-medical: Not on file  Tobacco Use  . Smoking status: Former Smoker    Packs/day: 0.50    Years: 25.00    Pack years: 12.50    Types: Cigarettes    Last attempt to quit: 1983    Years since quitting: 36.7  . Smokeless tobacco: Never Used  Substance and Sexual Activity  . Alcohol use: Yes    Comment: 06/10/2016 "might have 2 beers/month"  . Drug use: No  . Sexual activity: Not on file  Lifestyle  . Physical activity:    Days per week: Not on file    Minutes per session: Not on file  . Stress: Not on file  Relationships  . Social connections:    Talks on phone: Not on file    Gets together: Not on file    Attends religious service: Not on file    Active member of club or organization: Not on file    Attends meetings of clubs or organizations: Not on file    Relationship status: Not on file  Other Topics Concern  . Not on file  Social History Narrative  . Not on file     Family History: The patient's family history includes Colon cancer in his father. ROS:   Please see the history of present illness.    All other systems reviewed and are negative.  EKGs/Labs/Other Studies Reviewed:    The following studies were reviewed today: Full pacemaker check with reprogramming  EKG:  EKG is not ordered today.    Recent Labs: 03/03/2017: ALT 21; BUN 22; Creatinine, Ser 1.13; Potassium 4.7; Sodium 140  Normal TSH 3.50  Physical Exam:    VS:  BP 124/72 (BP Location: Left Arm, Patient Position: Sitting, Cuff Size: Normal)   Pulse (!) 54   Ht 5\' 11"  (1.803 m)   Wt 161 lb (73 kg)   BMI 22.45 kg/m     Wt Readings from Last 3 Encounters:  10/06/17 161 lb (73 kg)  09/21/17 160 lb (72.6 kg)  05/25/17 165 lb 6.4 oz (75 kg)     General: Alert, oriented x3, no distress, appears very fit for his age, healthy subclavian pacemaker site Head: no evidence of  trauma, PERRL, EOMI, no exophtalmos or lid lag, no myxedema, no xanthelasma; normal ears, nose and oropharynx Neck: normal jugular venous pulsations and no hepatojugular reflux; brisk carotid pulses without delay and no carotid bruits Chest: clear to auscultation, no signs of consolidation by percussion or palpation, normal fremitus, symmetrical and full respiratory excursions Cardiovascular: normal position and quality of the apical impulse, regular rhythm, normal first and second heart sounds, 2/6 early peaking systolic ejection  murmur in the aortic focus, no diastolic murmurs, rubs or gallops Abdomen: no tenderness or distention, no masses by palpation, no abnormal pulsatility or arterial bruits, normal bowel sounds, no hepatosplenomegaly Extremities: no clubbing, cyanosis or edema; 2+ radial, ulnar and brachial pulses bilaterally; 2+ right femoral, posterior tibial and dorsalis pedis pulses; 2+ left femoral, posterior tibial and dorsalis pedis pulses; no subclavian or femoral bruits Neurological: grossly nonfocal Psych: Normal mood and affect   ASSESSMENT:    1. SSS (sick sinus syndrome) (HCC)   2. Pacemaker   3. Coronary artery disease involving native coronary artery of native heart without angina pectoris   4. Essential hypertension   5. Bilateral leg edema      PLAN:    In order of problems listed above:  1. SSS: He has evidence not only of sinus bradycardia, but also chronotropic incompetence.  I made his rate response sensor settings a little more aggressive today. 2. Pacemaker: Normal device function, continue remote downloads every 3 months. 3. CAD: Denies angina pectoris despite a very active lifestyle. 4. HTN: Well-controlled. 5. Edema: Likely a consequence of amlodipine therapy, rapidly improved a low dose of diuretic.  Discussed sodium restriction.   Medication Adjustments/Labs and Tests Ordered: Current medicines are reviewed at length with the patient today.   Concerns regarding medicines are outlined above. Labs and tests ordered and medication changes are outlined in the patient instructions below:  Patient Instructions  Dr Royann Shivers recommends that you continue on your current medications as directed. Please refer to the Current Medication list given to you today.  Remote monitoring is used to monitor your Pacemaker or ICD from home. This monitoring reduces the number of office visits required to check your device to one time per year. It allows [place] to keep an eye on the functioning of your device to ensure it is working properly. You are scheduled for a device check from home on Tuesday, December 3rd, 2019. You may send your transmission at any time that day. If you have a wireless device, the transmission will be sent automatically. After your physician reviews your transmission, you will receive a notification with your next transmission date.  To improve our patient care and to more adequately follow your device, [facility] has decided, as a practice, to start following each patient four times a year with your home monitor. This means that you may experience a remote appointment that is close to an in-office appointment with your physician. Your insurance will apply at the same rate as other remote monitoring transmissions.  Dr Royann Shivers recommends that you schedule a follow-up appointment in 12 months with a pacemaker check. You will receive a reminder letter in the mail two months in advance. If you don't receive a letter, please call our office to schedule the follow-up appointment.  If you need a refill on your cardiac medications before your next appointment, please call your pharmacy.    Signed, Thurmon Fair, MD  10/08/2017 4:09 PM    [facility]

## 2017-10-06 NOTE — Patient Instructions (Signed)
Dr Royann Shiversroitoru recommends that you continue on your current medications as directed. Please refer to the Current Medication list given to you today.  Remote monitoring is used to monitor your Pacemaker or ICD from home. This monitoring reduces the number of office visits required to check your device to one time per year. It allows us to keep an eye on the functioning of your device to ensure it is working properly. You are scheduled for a device check from home on Tuesday, December 3rd, 2019. You may send your transmission at any time that day. If you have a wireless device, the transmission will be sent automatically. After your physician reviews your transmission, you will receive a notification with your next transmission date.  To improve our patient care and to more adequately follow your device, [facility] has decided, as a practice, to start following each patient four times a year with your home monitor. This means that you may experience a remote appointment that is close to an in-office appointment with your physician. Your insurance will apply at the same rate as other remote monitoring transmissions.  Dr Royann Shiversroitoru recommends that you schedule a follow-up appointment in 12 months with a pacemaker check. You will receive a reminder letter in the mail two months in advance. If you don't receive a letter, please call our office to schedule the follow-up appointment.  If you need a refill on your cardiac medications before your next appointment, please call your pharmacy.

## 2017-10-29 LAB — CUP PACEART INCLINIC DEVICE CHECK
Battery Remaining Longevity: 151 mo
Brady Statistic RA Percent Paced: 73.7 %
Brady Statistic RV Percent Paced: 0.1 % — CL
Date Time Interrogation Session: 20191018142359
Implantable Lead Implant Date: 20180530
Implantable Lead Location: 753859
Implantable Lead Model: 5076
Implantable Pulse Generator Implant Date: 20180530
Lead Channel Impedance Value: 323 Ohm
Lead Channel Pacing Threshold Amplitude: 0.75 V
Lead Channel Pacing Threshold Amplitude: 0.75 V
Lead Channel Pacing Threshold Pulse Width: 0.4 ms
Lead Channel Setting Pacing Amplitude: 2 V
Lead Channel Setting Pacing Pulse Width: 0.4 ms
Lead Channel Setting Sensing Sensitivity: 2 mV
MDC IDC LEAD IMPLANT DT: 20180530
MDC IDC LEAD LOCATION: 753860
MDC IDC MSMT LEADCHNL RA SENSING INTR AMPL: 3.1 mV
MDC IDC MSMT LEADCHNL RV IMPEDANCE VALUE: 418 Ohm
MDC IDC MSMT LEADCHNL RV PACING THRESHOLD PULSEWIDTH: 0.4 ms
MDC IDC MSMT LEADCHNL RV SENSING INTR AMPL: 20 mV
MDC IDC SET LEADCHNL RA PACING AMPLITUDE: 1.5 V

## 2017-11-10 ENCOUNTER — Ambulatory Visit (INDEPENDENT_AMBULATORY_CARE_PROVIDER_SITE_OTHER): Payer: Medicare Other | Admitting: Podiatry

## 2017-11-10 DIAGNOSIS — M79674 Pain in right toe(s): Secondary | ICD-10-CM | POA: Diagnosis not present

## 2017-11-10 DIAGNOSIS — B351 Tinea unguium: Secondary | ICD-10-CM | POA: Diagnosis not present

## 2017-11-10 DIAGNOSIS — M79675 Pain in left toe(s): Secondary | ICD-10-CM | POA: Diagnosis not present

## 2017-11-10 DIAGNOSIS — E119 Type 2 diabetes mellitus without complications: Secondary | ICD-10-CM | POA: Diagnosis not present

## 2017-11-10 DIAGNOSIS — L84 Corns and callosities: Secondary | ICD-10-CM | POA: Diagnosis not present

## 2017-11-10 NOTE — Patient Instructions (Signed)

## 2017-11-15 IMAGING — CR DG CHEST 2V
2 series · 2 of 2 positions shown · non-contrast
Comparison: 03/28/2004 chest CT.

CLINICAL DATA: Preoperative examination for heart catheterization
and pacemaker placement.

EXAM:
CHEST  2 VIEW

[w chest pa]
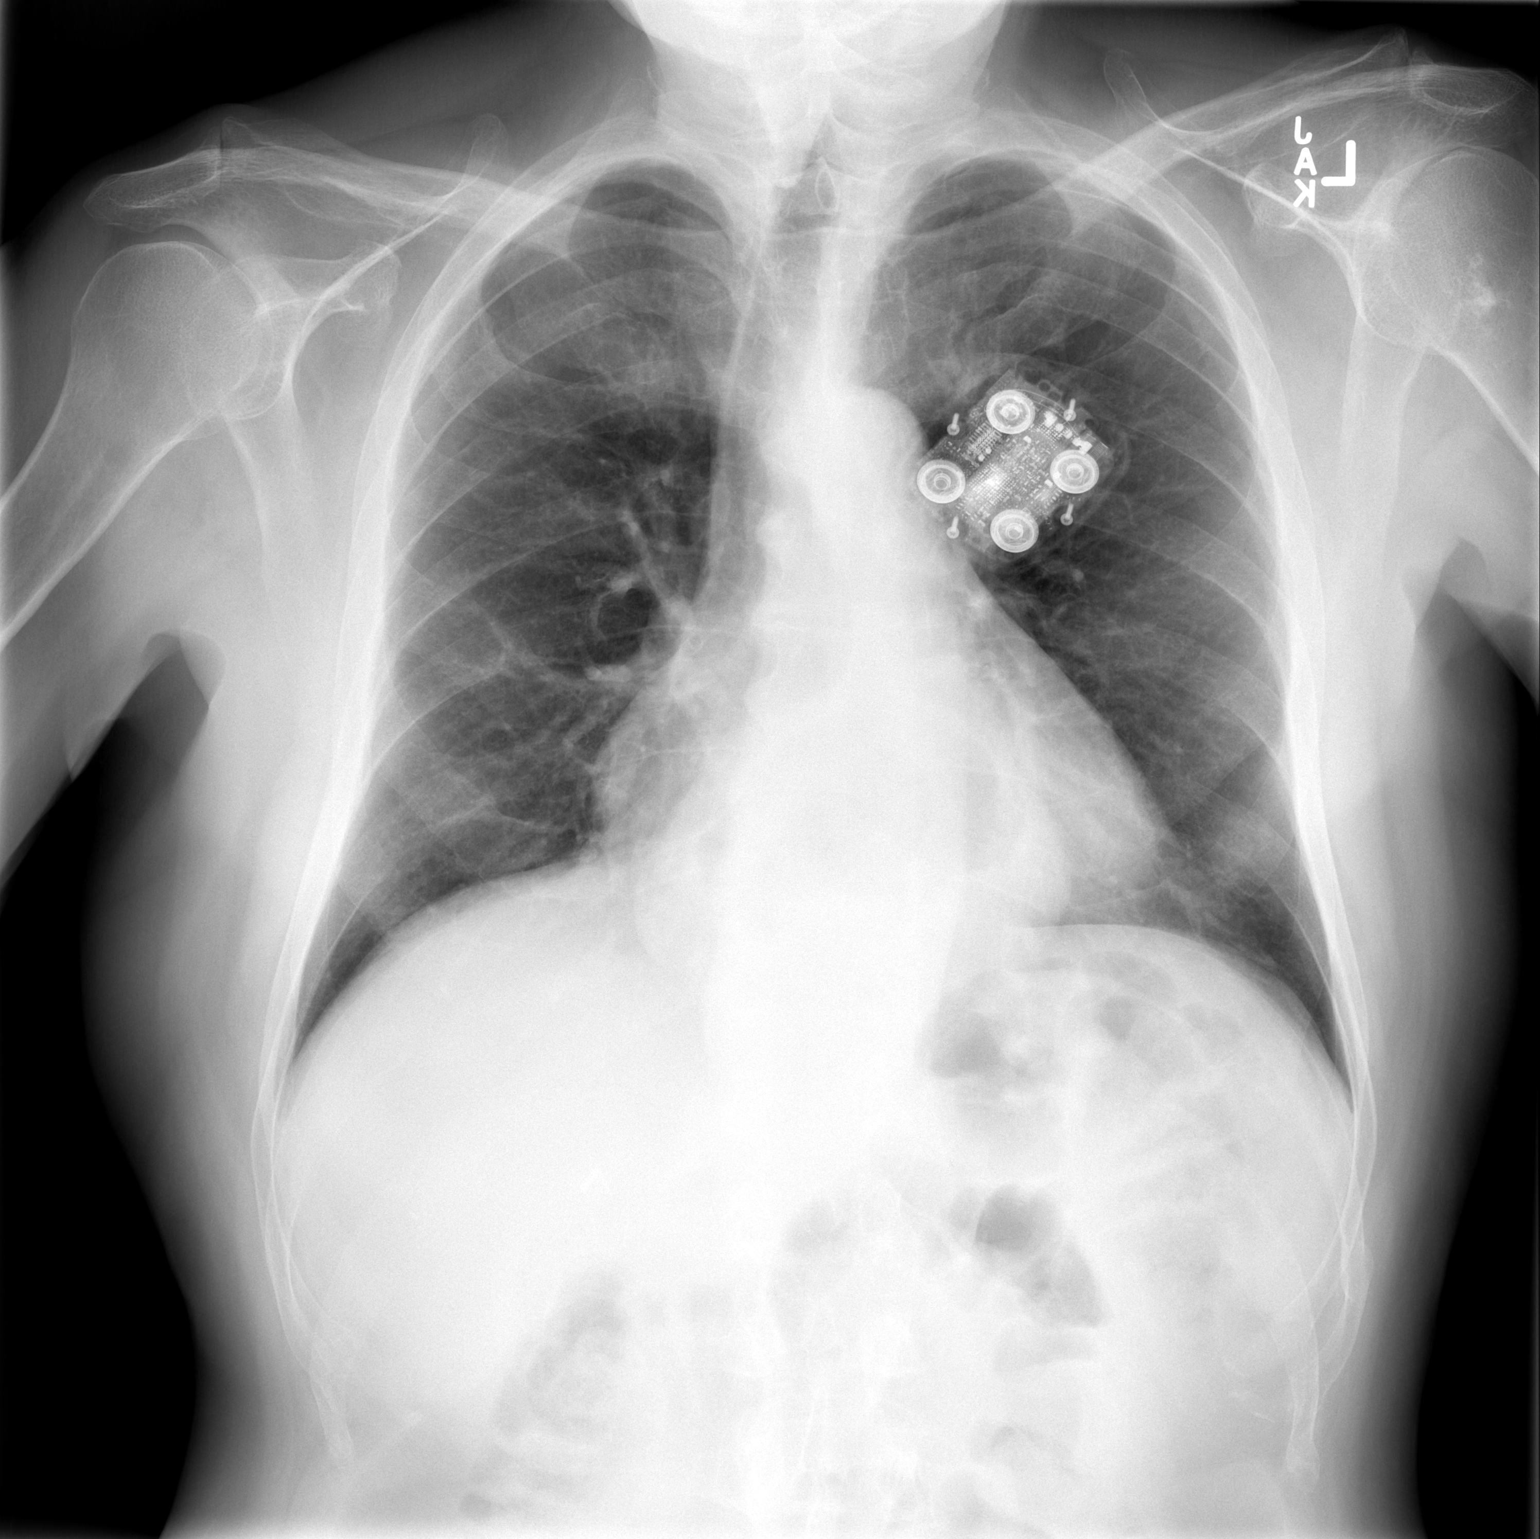

[w chest lat]
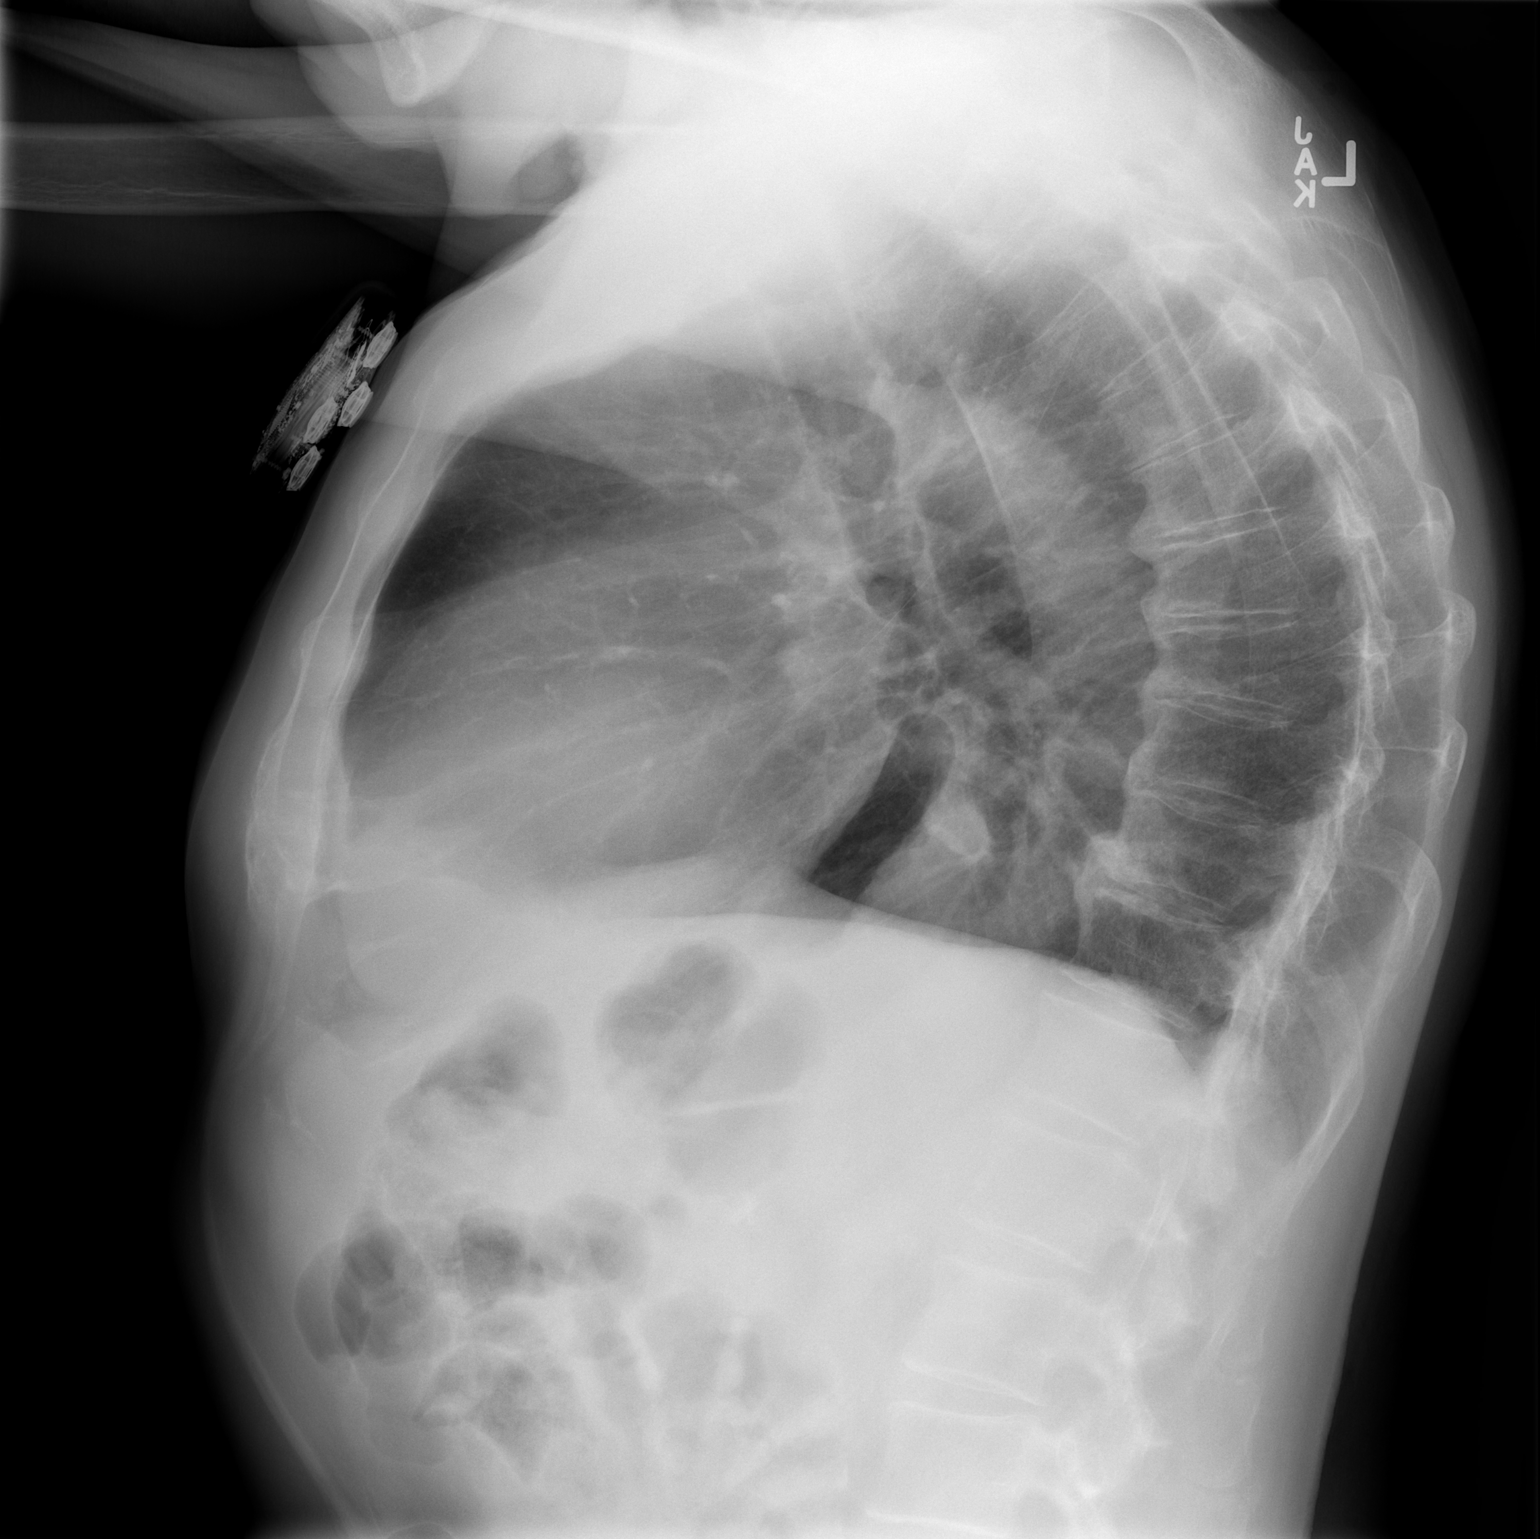

[2 of 2 positions shown; findings below may reference images not displayed]

FINDINGS: Cardiomegaly again identified. An electronic device overlying the
left chest is noted.

A moderate to large hiatal hernia is noted.

There is no evidence of focal airspace disease, pulmonary edema,
suspicious pulmonary nodule/mass, pleural effusion, or pneumothorax.
No acute bony abnormalities are identified.
IMPRESSION: No evidence of acute cardiopulmonary disease.

Cardiomegaly and moderate to large hiatal hernia.

## 2017-11-28 ENCOUNTER — Encounter: Payer: Self-pay | Admitting: Podiatry

## 2017-11-28 NOTE — Progress Notes (Signed)
Subjective: Travis [name] presents today with cc of painful, discolored, thick toenails which interfere with daily activities  Pain is getting progressively worse and relieved with periodic professional debridement.  Travis [name] also has calluses of both feet and notes tenderness with weightbearing with and without shoe gear.   Objective: Vascular Examination: Capillary refill time <3 seconds x 10 digits Dorsalis pedis and posterior tibial pulses present b/l No digital hair x 10 digits Skin temperature gradient WNL b/l  Dermatological Examination: Skin with normal turgor, texture and tone b/l  Toenails 1-5 b/l discolored, thick, dystrophic with subungual debris and pain with palpation to nailbeds due to thickness of nails.  Hyperkeratotic lesion submet head 1, 5 b/l with tenderness to palpation. No edema, no erythema, no drainage.  Musculoskeletal: Muscle strength 5/5 to all LE muscle groups  Neurological: Sensation intact with 10 gram monofilament. Vibratory sensation intact.  Assessment: Painful onychomycosis toenails 1-5 b/l  Painful calluses submet head 1, 5 b/l  NIDDM  Plan: 1. Continue diabetic foot care principles. Literature dispensed. 2. Toenails 1-5 b/l were debrided in length and girth without iatrogenic bleeding. 3. Calluses pared submet head 1, 5 b/l without incident 4. Patient to continue soft, supportive shoe gear 5. Patient to report any pedal injuries to medical professional immediately. 6. Follow up 3 months. Patient/POA to call should there be a concern in the interim.

## 2017-12-14 ENCOUNTER — Ambulatory Visit (INDEPENDENT_AMBULATORY_CARE_PROVIDER_SITE_OTHER): Payer: Medicare Other

## 2017-12-14 DIAGNOSIS — I495 Sick sinus syndrome: Secondary | ICD-10-CM | POA: Diagnosis not present

## 2017-12-14 NOTE — Progress Notes (Signed)
Remote pacemaker transmission.   

## 2017-12-15 NOTE — Progress Notes (Signed)
Tawana ScaleZach [name] D.O. [facility] [address] [place], [place] [address] Phone: [phone] Subjective:   Travis [name], Travis [name], am serving as a scribe for Dr. Antoine PrimasZachary [name].   CC: Left knee pain  LKG:[id number]:Subjective  Travis MingsJames F [name] is a 77 y.o. male coming in with complaint of left knee pain. Patient has been having knee pain for one year with increasing pain for 1 month. Pain on anterior. Pain with running and at rest. Patient has history of meniscus surgery. Has been wearing knee brace on the left leg which did not help.  When asked patient did have a fall.  Fell directly on his left elbow and his knee.  Describes the pain as a dull, throbbing aching pain.    Past Medical History:  Diagnosis Date  . Heart murmur    "an innocent one" (06/10/2016)  . History of blood transfusion ~ 1956   "while I was in hospital; don't remember why"  . History of kidney stones   . Hyperlipidemia   . Hypertension   . 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 SSS (sick sinus syndrome) (HCC) 06/10/2016  . Type II diabetes mellitus (HCC)    Past Surgical History:  Procedure Laterality Date  . APPENDECTOMY    . CARDIAC CATHETERIZATION  06/10/2016  . CATARACT EXTRACTION W/ INTRAOCULAR LENS IMPLANT Right   . CYSTOSCOPY W/ STONE MANIPULATION    . INSERT / REPLACE / REMOVE PACEMAKER  06/10/2016   dual chamber permanent pacemaker  . KNEE ARTHROSCOPY W/ MENISCECTOMY Bilateral   . LAPAROSCOPIC CHOLECYSTECTOMY    . LEFT HEART CATH AND CORONARY ANGIOGRAPHY N/A 06/10/2016   Procedure: Left Heart Cath and Coronary Angiography;  Surgeon: Travis PattyBensimhon, Travis R, MD;  [facility];  Service: Cardiovascular;  Laterality: N/A;  . LITHOTRIPSY    . PACEMAKER IMPLANT N/A 06/10/2016   Procedure: Pacemaker Implant;  Surgeon: Travis [name], Mihai, MD;  Location: [facility] CV [facility];  Service: Cardiovascular;  Laterality: N/A;  . TONSILLECTOMY     Social History   Socioeconomic History  . Marital  status: Married    Spouse name: Not on file  . Number of children: Not on file  . Years of education: Not on file  . Highest education level: Not on file  Occupational History  . Not on file  Social Needs  . Financial resource strain: Not on file  . Food insecurity:    Worry: Not on file    Inability: Not on file  . Transportation needs:    Medical: Not on file    Non-medical: Not on file  Tobacco Use  . Smoking status: Former Smoker    Packs/day: 0.50    Years: 25.00    Pack years: 12.50    Types: Cigarettes    Last attempt to quit: 1983    Years since quitting: 36.9  . Smokeless tobacco: Never Used  Substance and Sexual Activity  . Alcohol use: Yes    Comment: 06/10/2016 "might have 2 beers/month"  . Drug use: No  . Sexual activity: Not on file  Lifestyle  . Physical activity:    Days per week: Not on file    Minutes per session: Not on file  . Stress: Not on file  Relationships  . Social connections:    Talks on phone: Not on file    Gets together: Not on file    Attends religious service: Not on file    Active member of club or  organization: Not on file    Attends meetings of clubs or organizations: Not on file    Relationship status: Not on file  Other Topics Concern  . Not on file  Social History Narrative  . Not on file   No Known Allergies Family History  Problem Relation Age of Onset  . Colon cancer Father     Current Outpatient Medications (Endocrine & Metabolic):  .  metFORMIN (GLUCOPHAGE) 500 MG tablet, Take 500 mg by mouth 2 (two) times daily with a meal.   Current Outpatient Medications (Cardiovascular):  .  amLODipine (NORVASC) 5 MG tablet, TAKE 1 TABLET BY MOUTH DAILY. Marland Kitchen  atorvastatin (LIPITOR) 40 MG tablet, TAKE 1 TABLET ONCE DAILY. Marland Kitchen  ezetimibe (ZETIA) 10 MG tablet, Take 1 tablet (10 mg total) daily by mouth. Marland Kitchen  lisinopril (PRINIVIL,ZESTRIL) 20 MG tablet, Take 1 tablet (20 mg total) by mouth daily.   Current Outpatient Medications  (Analgesics):  .  aspirin EC 81 MG tablet, Take 81 mg by mouth daily.   Current Outpatient Medications (Other):  Marland Kitchen  Cholecalciferol (VITAMIN D3) 2000 units capsule, Take 2,000 Units by mouth daily. .  COMBIGAN 0.2-0.5 % ophthalmic solution, Place 1 drop into both eyes 2 (two) times daily.     Past medical history, social, surgical and family history all reviewed in electronic medical record.  No pertanent information unless stated regarding to the chief complaint.   Review of Systems:  No headache, visual changes, nausea, vomiting, diarrhea, constipation, dizziness, abdominal pain, skin rash, fevers, chills, night sweats, weight loss, swollen lymph nodes, body aches, joint swelling,  chest pain, shortness of breath, mood changes.  Positive muscle aches  Objective  Blood pressure 120/74, height 5\' 11"  (1.803 m), weight 167 lb (75.8 kg).   General: No apparent distress alert and oriented x3 mood and affect normal, dressed appropriately.  HEENT: Pupils equal, extraocular movements intact  Respiratory: Patient's speak in full sentences and does not appear short of breath  Cardiovascular: No lower extremity edema, non tender, no erythema  Skin: Warm dry intact with no signs of infection or rash on extremities or on axial skeleton.  Abdomen: Soft nontender  Neuro: Cranial nerves II through XII are intact, neurovascularly intact in all extremities with 2+ DTRs and 2+ pulses.  Lymph: No lymphadenopathy of posterior or anterior cervical chain or axillae bilaterally.  Gait normal with good balance and coordination.  MSK:  Non tender with full range of motion and good stability and symmetric strength and tone of shoulders, elbows, wrist, hip, and ankles bilaterally.  Left knee exam on the left side shows the patient does have some mild swelling over the patella noted.  Patient does have tenderness over the patella itself.  Near full range of motion but does have crepitus.  Mild lateral tracking of  the patella noted.  Good stability of the knee.  Lacks last 5 degrees of flexion in the last 2 degrees of extension  Limited musculoskeletal ultrasound was performed and interpreted by Judi Saa  Limited ultrasound of patient's good stability noted.  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  Degenerative tearing of the medial meniscus noted.   Impression and Recommendations:     This case required medical decision making of moderate complexity. The above documentation has been reviewed and is accurate and complete Judi Saa, DO       Note: This dictation was prepared with Dragon dictation along with smaller phrase technology. Any transcriptional errors that result from  this process are unintentional.

## 2017-12-16 ENCOUNTER — Other Ambulatory Visit: Payer: Self-pay

## 2017-12-16 ENCOUNTER — Encounter: Payer: Self-pay | Admitting: Family Medicine

## 2017-12-16 ENCOUNTER — Ambulatory Visit (INDEPENDENT_AMBULATORY_CARE_PROVIDER_SITE_OTHER): Payer: Medicare Other | Admitting: Family Medicine

## 2017-12-16 ENCOUNTER — Ambulatory Visit (INDEPENDENT_AMBULATORY_CARE_PROVIDER_SITE_OTHER)
Admission: RE | Admit: 2017-12-16 | Discharge: 2017-12-16 | Disposition: A | Discharge status: Home / Self Care | Payer: Medicare Other | Source: Ambulatory Visit | Attending: Family Medicine | Admitting: Family Medicine

## 2017-12-16 VITALS — BP 120/74 | Ht 71.0 in | Wt 167.0 lb

## 2017-12-16 DIAGNOSIS — M25562 Pain in left knee: Secondary | ICD-10-CM

## 2017-12-16 DIAGNOSIS — I2510 Atherosclerotic heart disease of native coronary artery without angina pectoris: Secondary | ICD-10-CM

## 2017-12-16 DIAGNOSIS — S82002A Unspecified fracture of left patella, initial encounter for closed fracture: Secondary | ICD-10-CM | POA: Insufficient documentation

## 2017-12-16 DIAGNOSIS — E119 Type 2 diabetes mellitus without complications: Secondary | ICD-10-CM

## 2017-12-16 DIAGNOSIS — G8929 Other chronic pain: Secondary | ICD-10-CM

## 2017-12-16 DIAGNOSIS — S82092A Other fracture of left patella, initial encounter for closed fracture: Secondary | ICD-10-CM | POA: Diagnosis not present

## 2017-12-16 NOTE — Assessment & Plan Note (Signed)
Small, healing, icing regimen and home exercises, bracing given today.  Discussed arthritic changes.  Patient is to potentially do increase in vitamin D.  Discussed icing regimen and home exercises.  Patient will follow-up with me again in 4 to 6 weeks

## 2017-12-16 NOTE — Patient Instructions (Signed)
Good to see you  Ice 20 minutes 2 times daily. Usually after activity and before bed. pennsaid pinkie amount topically 2 times daily as needed.  Increase your vitamin D to 4000 IU daily  Stay active but wear brace with any working out.  Avoid hills for now.  See me again in 4 weeks to make sure I can let you do your regular regimen again  Happy holidays!

## 2017-12-17 ENCOUNTER — Other Ambulatory Visit: Payer: Self-pay | Admitting: Cardiovascular Disease

## 2017-12-17 LAB — COMPREHENSIVE METABOLIC PANEL
ALK PHOS: 77 IU/L (ref 39–117)
ALT: 21 IU/L (ref 0–44)
AST: 17 IU/L (ref 0–40)
Albumin/Globulin Ratio: 2.4 — ABNORMAL HIGH (ref 1.2–2.2)
Albumin: 4.6 g/dL (ref 3.5–4.8)
BUN/Creatinine Ratio: 18 (ref 10–24)
BUN: 18 mg/dL (ref 8–27)
Bilirubin Total: 1.1 mg/dL (ref 0.0–1.2)
CALCIUM: 10.2 mg/dL (ref 8.6–10.2)
CO2: 23 mmol/L (ref 20–29)
CREATININE: 1.01 mg/dL (ref 0.76–1.27)
Chloride: 102 mmol/L (ref 96–106)
GFR, EST AFRICAN AMERICAN: 82 mL/min/{1.73_m2} (ref >59)
GFR, EST NON AFRICAN AMERICAN: 71 mL/min/{1.73_m2} (ref >59)
GLUCOSE: 135 mg/dL — AB (ref 65–99)
Globulin, Total: 1.9 g/dL (ref 1.5–4.5)
Potassium: 4.5 mmol/L (ref 3.5–5.2)
Sodium: 141 mmol/L (ref 134–144)
Total Protein: 6.5 g/dL (ref 6.0–8.5)

## 2017-12-17 LAB — LIPID PANEL
CHOL/HDL RATIO: 1.8 ratio (ref 0.0–5.0)
CHOLESTEROL TOTAL: 119 mg/dL (ref 100–199)
HDL: 67 mg/dL (ref >39)
LDL CALC: 43 mg/dL (ref 0–99)
Triglycerides: 45 mg/dL (ref 0–149)
VLDL Cholesterol Cal: 9 mg/dL (ref 5–40)

## 2017-12-18 LAB — HEMOGLOBIN A1C
Est. average glucose Bld gHb Est-mCnc: 174 mg/dL
Hgb A1c MFr Bld: 7.7 % — ABNORMAL HIGH (ref 4.8–5.6)

## 2017-12-21 ENCOUNTER — Encounter: Payer: Self-pay | Admitting: Cardiology

## 2017-12-21 ENCOUNTER — Ambulatory Visit (INDEPENDENT_AMBULATORY_CARE_PROVIDER_SITE_OTHER): Payer: Medicare Other | Admitting: Cardiovascular Disease

## 2017-12-21 ENCOUNTER — Encounter: Payer: Self-pay | Admitting: Cardiovascular Disease

## 2017-12-21 VITALS — BP 134/64 | HR 78 | Ht 71.0 in | Wt 165.2 lb

## 2017-12-21 DIAGNOSIS — I10 Essential (primary) hypertension: Secondary | ICD-10-CM

## 2017-12-21 DIAGNOSIS — E785 Hyperlipidemia, unspecified: Secondary | ICD-10-CM

## 2017-12-21 DIAGNOSIS — I495 Sick sinus syndrome: Secondary | ICD-10-CM

## 2017-12-21 DIAGNOSIS — I2510 Atherosclerotic heart disease of native coronary artery without angina pectoris: Secondary | ICD-10-CM

## 2017-12-21 DIAGNOSIS — I493 Ventricular premature depolarization: Secondary | ICD-10-CM

## 2017-12-21 DIAGNOSIS — Z950 Presence of cardiac pacemaker: Secondary | ICD-10-CM

## 2017-12-21 DIAGNOSIS — E119 Type 2 diabetes mellitus without complications: Secondary | ICD-10-CM

## 2017-12-21 NOTE — Progress Notes (Signed)
Cardiology Office Note    Date:  12/22/2017   ID:  Travis [name], DOB 01-14-1940, MRN [id number]  PCP:  Leeroy Cha, MD  Referring M.D.: Dr. Leeroy Cha  Cardiologist:  Shelva Majestic, MD   No chief complaint on file.   History of Present Illness:  Travis [name] is a 77 y.o. male who was initially referred through the courtesy of Travis [name] for evaluation of lightheadedness and dizziness while running.  I last saw him in May 2019.  He presents for 13-monthfollow-up evaluation.  Mr. SRowehas been active for his entire life.  He has a history of hypertension, hyperlipidemia, and has been diagnosed with type 2 diabetes mellitus for 14 years.  He exercises at least 6 days per week for 1-2 hours.  He does resistance training, spin classes, and typically runs anywhere from 5-6 miles at a 9-10 minute mile pace and has been doing so for many years.  His pulse typically runs in the low 30s.  Recently, he has begun to notice that he becomes dizzy when he starts to run.  This is associated with mild shortness of breath.  He denies any chest pain.  The symptoms have occurred since January.  He denies any frank syncope.  He denies the lightheaded symptoms when he is at rest and his symptoms only occur with significant activity.  When he does spin class he is unaware of any  symptoms.    When I saw for initial evaluation, his ECG revealed marked sinus bradycardia with ventricular rate averaging 37 bpm.  PR interval was 190 ms and QTc interval was 335 ms.  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  He had a 2/6 systolic murmur on physical exam, suggestive of possible aortic valve disease.  I scheduled him for a 2-D echo Doppler study which was done on 06/02/2016.  This showed an ejection fraction at 55-65%.  There was mild LVH.  He did not have any regional wall motion abnormalities.  His aortic valve was reported as  structurally normal.  There was a mean gradient of 9 and a peak gradient of 17.  There was mild MR and mild LA dilation.  His right atrium was mild moderately dilated.  I obtain a magnesium level, which was normal.  I scheduled him for an exercise nuclear stress test to assess both chronotropic competence to exercise as well as myocardial perfusion.  On his exercise portion.  His resting heart rate was 34 bpm, which rose to a maximum of 126/m.  He completed 2 minutes and 2 seconds into stage III of Bruce protocol.  He was able to augment his heart rate appropriately with exercise.  At the end of stage I reflective of good aerobic conditioning ventricular rate was 71, at the end of stage II 100, and in stage III maximum heart rate was 125.  He was entirely asymptomatic but had an exaggerated blood pressure response opted to 16/75 and at peak stress developed asymptomatic ST segment depression up to 3 mm in the inferior and inferolateral leads.  There was a very small mild distal anteroseptal defect which most likely was artifact.  There was no ischemia identified.  He has been wearing an event monitor.  We werereceivingdaily notifications demonstrating marked bradycardia with heart rates in the 30s.  These have been sinus rhythm.  He has been entirely asymptomatic.  He had developed sinus pauses up to 5 -  6 seconds.  He remained entirely asymptomatic and specifically denied any dizziness or lightheadedness or chest pain.  He was unaware of sleep apnea but he does snore.    When I saw him in follow-up on 06/04/2016.  I had extensive discussion with him, both concerning his ST segment changes on ECG during stress testing as well as his need for a permanent pacemaker.  Due to scheduling difficulties and since he remained asymptomatic, ultimately, both of these procedures were done on 06/10/2016.  His diagnostic catheterization was done by Dr. Tiney Rouge I was unable to be in the hospital that day.  I reviewed the  angiographic findings.  He had normal LV function.  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  There was mild 30 and 40% smooth circumflex narrowings.  Later that same day in the afternoon he underwent successful insertion of a permanent pacemaker by Dr. 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  He tolerated both procedures well.  Doing his pacemaker insertion, he has continued to run and has been without dizziness or lightheadedness. He saw Dr. Sallyanne Kuster on 09/15/2016 for pacemaker follow-up evaluation.  He had normal device function.  He is now scheduled to undergo remote download every 3 months and have yearly office visits with Dr. Sallyanne Kuster.  and have yearly office visits with Dr. Sallyanne Kuster.  His blood pressure at that time was elevated his blood pressure at that time was elevated He is now scheduled to undergo remote downloads every 3 months  He had normal device function.  And no longer experiences any dizziness or lightheadedness.  He saw Dr. Sallyanne Kuster on September 15, 2016 for pacemaker follow-up evaluation.   His blood pressure that day was elevated and his lisinopril was increased to 20 mg and he continued to be on amlodipine.  He feels his blood pressure has been better.  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  In early 2018 had been running approximately 30 miles per week and denied chest pain, PND orthopnea.   I added Zetia to his regimen of atorvastatin.  This is resulted in marked improvement in lipid status and blood work done by his primary physician on March 03, 2017 showed an LDL cholesterol at 31.  He is on lisinopril 20 mg and amlodipine 5 mg for hypertension.  He is on metformin for mild type 2 diabetes mellitus.  Recent hemoglobin A1c was  6.3.  He has had some mild knee discomfort and for this reason has elected not to participate in the nationals and running in June.   Since I last saw him in May 2019, he has continued to remain asymptomatic.  He still runs but he had fractured his left kneecap which had limit his is activity for some time.  He is unaware of any palpitations.  He denies any chest pressure PND orthopnea.  He underwent repeat laboratory on December 17, 2017 which showed excellent lipid studies with total cholesterol 119, HDL 67, LDL 43, and triglycerides 45.  He presents for evaluation.  Past Medical History:  Diagnosis Date  . Heart murmur    "an innocent one" (06/10/2016)  . History of blood transfusion ~ 1956   "while I was in hospital; don't remember why"  . History of  kidney stones   . Hyperlipidemia   . Hypertension   . 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 SSS (sick sinus syndrome) ([place]) 06/10/2016  . Type II diabetes mellitus ([place])     Past Surgical History:  Procedure Laterality Date  . APPENDECTOMY    . CARDIAC CATHETERIZATION  06/10/2016  . CATARACT EXTRACTION W/ INTRAOCULAR LENS IMPLANT Right   . CYSTOSCOPY W/ STONE MANIPULATION    . INSERT / REPLACE / REMOVE PACEMAKER  06/10/2016   dual chamber permanent pacemaker  . KNEE ARTHROSCOPY W/ MENISCECTOMY Bilateral   . LAPAROSCOPIC CHOLECYSTECTOMY    . LEFT HEART CATH AND CORONARY ANGIOGRAPHY N/A 06/10/2016   Procedure: Left Heart Cath and Coronary Angiography;  Surgeon: Jolaine Artist, MD;  Location: [facility];  Service: Cardiovascular;  Laterality: N/A;  . LITHOTRIPSY    . PACEMAKER IMPLANT N/A 06/10/2016   Procedure: Pacemaker Implant;  Surgeon: Sanda Klein, MD;  Location: [facility];  Service: Cardiovascular;  Laterality: N/A;  . TONSILLECTOMY      Current Medications: Outpatient Medications Prior to Visit  Medication Sig Dispense Refill  . amLODipine (NORVASC) 5 MG tablet TAKE 1 TABLET BY MOUTH  DAILY. 90 tablet 3  . aspirin EC 81 MG tablet Take 81 mg by mouth daily.    Marland Kitchen atorvastatin (LIPITOR) 40 MG tablet TAKE 1 TABLET ONCE DAILY. 90 tablet 4  . Cholecalciferol (VITAMIN D3) 2000 units capsule Take 2,000 Units by mouth daily.    . COMBIGAN 0.2-0.5 % ophthalmic solution Place 1 drop into both eyes 2 (two) times daily.     . metFORMIN (GLUCOPHAGE) 500 MG tablet Take 500 mg by mouth 2 (two) times daily with a meal.     . lisinopril (PRINIVIL,ZESTRIL) 20 MG tablet TAKE 1 TABLET ONCE DAILY. 90 tablet 3  . ezetimibe (ZETIA) 10 MG tablet Take 1 tablet (10 mg total) daily by mouth. 90 tablet 3   No facility-administered medications prior to visit.      Allergies:   Patient has no known allergies.   Social History   Socioeconomic History  . Marital status: Married    Spouse name: Not on file  . Number of children: Not on file  . Years of education: Not on file  . Highest education level: Not on file  Occupational History  . Not on file  Social Needs  . Financial resource strain: Not on file  . Food insecurity:    Worry: Not on file    Inability: Not on file  . Transportation needs:    Medical: Not on file    Non-medical: Not on file  Tobacco Use  . Smoking status: Former Smoker    Packs/day: 0.50    Years: 25.00    Pack years: 12.50    Types: Cigarettes    Last attempt to quit: 1983    Years since quitting: 36.9  . Smokeless tobacco: Never Used  Substance and Sexual Activity  . Alcohol use: Yes    Comment: 06/10/2016 "might have 2 beers/month"  . Drug use: No  . Sexual activity: Not on file  Lifestyle  . Physical activity:    Days per week: Not on file    Minutes per session: Not on file  . Stress: Not on file  Relationships  . Social connections:    Talks on phone: Not on file    Gets together: Not on file    Attends religious service: Not on file  Active member of club or organization: Not on file    Attends meetings of clubs or organizations: Not on file      Relationship status: Not on file  Other Topics Concern  . Not on file  Social History Narrative  . Not on file    Social history is notable in that he was born in [place].  He's been married for 54 years.  He has 2 children, 7 grandchildren, and one great-grandchild.  He has a Scientist, water quality in Theatre stage manager.  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  He is retired.  He drinks occasional beer.  He does not smoke.   Family History:  The patient's family history includes Colon cancer in his father..  He has a brother age 68 and a sister age 3, who are healthy.   ROS General: Negative; No fevers, chills, or night sweats;  HEENT: Negative; No changes in vision or hearing, sinus congestion, difficulty swallowing Pulmonary: Negative; No cough, wheezing, shortness of breath, hemoptysis Cardiovascular:  See HPI GI: Negative; No nausea, vomiting, diarrhea, or abdominal pain GU: Negative; No dysuria, hematuria, or difficulty voiding Musculoskeletal: Left knee discomfort Hematologic/Oncology: Negative; no easy bruising, bleeding Endocrine: Negative; no heat/cold intolerance; no diabetes Neuro: Negative; no changes in balance, headaches Skin: Negative; No rashes or skin lesions Psychiatric: Negative; No behavioral problems, depression Sleep: Positive for snoring, no daytime sleepiness, hypersomnolence, bruxism, restless legs, hypnogognic hallucinations, no cataplexy Other comprehensive 14 point system review is negative.   PHYSICAL EXAM:   VS:  BP 134/64   Pulse 78   Ht '5\' 11"'  (1.803 m)   Wt 165 lb 3.2 oz (74.9 kg)   BMI 23.04 kg/m     Repeat blood pressure by me 130/68  Wt Readings from Last 3 Encounters:  12/21/17 165 lb 3.2 oz (74.9 kg)  12/16/17 167 lb (75.8 kg)  10/06/17 161 lb (73 kg)    General: Alert, oriented, no distress.  Skin: normal turgor, no rashes, warm and dry HEENT: Normocephalic, atraumatic. Pupils equal round and reactive to  light; sclera anicteric; extraocular muscles intact;  Nose without nasal septal hypertrophy Mouth/Parynx benign; Mallinpatti scale 2Neck: No JVD, no carotid bruits; normal carotid upstroke Lungs: clear to ausculatation and percussion; no wheezing or rales Chest wall: without tenderness to palpitation Heart: PMI not displaced, RRR, s1 s2 normal, 1/6 systolic murmur, no diastolic murmur, no rubs, gallops, thrills, or heaves Abdomen: soft, nontender; no hepatosplenomehaly, BS+; abdominal aorta nontender and not dilated by palpation. Back: no CVA tenderness Pulses 2+ Musculoskeletal: full range of motion, normal strength, no joint deformities Extremities: no clubbing cyanosis or edema, Homan's sign negative  Neurologic: grossly nonfocal; Cranial nerves grossly wnl Psychologic: Normal mood and affect   Studies/Labs Reviewed:   ECG (independently read by me): Atrially paced rhythm with PVCs and transient ventricular bigeminy.  Prolonged AV conduction with PR interval 230 ms.  May 2019 ECG (independently read by me): Atrial paced rhythm at 51 bpm.  Prolonged AV conduction with a PR interval of 242 ms.  QTc interval 383 ms.  November 24, 2016 EKG:  EKG is ordered today.  A paced rhythm at 51 bpm.  PR interval 222 ms.  Mild LVH.  QTc interval 372 ms  06/16/2016 ECG (independently read by me): Atrially paced rhythm at 50 bpm with prolonged AV conduction with a PR interval at 214 ms.  06/04/2016 ECG (independently read by me): Marked sinus bradycardia with possible low atrial  involvement.  PR interval 166 ms, QTc interval 335 ms.  Ventricular rate 36.  05/20/2016 ECG (independently read by me): Marked sinus bradycardia with sinus arrhythmia, ventricular rate in the 40s to upper 30s, average 37.  PR interval 190 ms.  QTc interval 335 ms.  No ST segment changes.  Recent Labs: BMP Latest Ref Rng & Units 12/17/2017 03/03/2017 11/27/2016  Glucose 65 - 99 mg/dL 135(H) 119(H) 108(H)  BUN 8 - 27 mg/dL '18  22 23  ' Creatinine 0.76 - 1.27 mg/dL 1.01 1.13 1.12  BUN/Creat Ratio 10 - '24 18 19 21  ' Sodium 134 - 144 mmol/L 141 140 138  Potassium 3.5 - 5.2 mmol/L 4.5 4.7 4.2  Chloride 96 - 106 mmol/L 102 106 98  CO2 20 - 29 mmol/L '23 23 26  ' Calcium 8.6 - 10.2 mg/dL 10.2 10.2 10.2     Hepatic Function Latest Ref Rng & Units 12/17/2017 03/03/2017 11/17/2016  Total Protein 6.0 - 8.5 g/dL 6.5 6.4 7.1  Albumin 3.5 - 4.8 g/dL 4.6 4.5 4.8  AST 0 - 40 IU/L '17 19 20  ' ALT 0 - 44 IU/L '21 21 19  ' Alk Phosphatase 39 - 117 IU/L 77 62 82  Total Bilirubin 0.0 - 1.2 mg/dL 1.1 1.2 1.3(H)    CBC Latest Ref Rng & Units 06/04/2016  WBC 3.4 - 10.8 x10E3/uL 7.1  Hemoglobin 13.0 - 17.7 g/dL 14.2  Hematocrit 37.5 - 51.0 % 41.9  Platelets 150 - 379 x10E3/uL 260   Lab Results  Component Value Date   MCV 96 06/04/2016   No results found for: TSH Lab Results  Component Value Date   HGBA1C 7.7 (H) 12/17/2017     BNP No results found for: BNP  ProBNP No results found for: PROBNP   Lipid Panel     Component Value Date/Time   CHOL 119 12/17/2017 0839   TRIG 45 12/17/2017 0839   HDL 67 12/17/2017 0839   CHOLHDL 1.8 12/17/2017 0839   LDLCALC 43 12/17/2017 0839     RADIOLOGY: Dg Knee Complete 4 Views Left  Result Date: 12/16/2017 CLINICAL DATA:  Anterior left knee pain following a fall 3 months ago. EXAM: LEFT KNEE - COMPLETE 4+ VIEW COMPARISON:  None. FINDINGS: Mild to moderate patellofemoral spur formation. Mild tibial spine spur formation. No fracture, dislocation or effusion. IMPRESSION: Degenerative changes, as described above. Electronically Signed   By: Claudie Revering M.D.   On: 12/16/2017 22:31     Additional studies/ records that were reviewed today include:  I reviewed the recent records from [facility]. I have reviewed his 2-D echo Doppler study, exercise Myoview study, laboratory, as well as daily  Monitors with critical notification from the event monitor prior to the insertion of the  pacemaker.  I personally reviewed the catheterization findings, as well as his pacemaker implantation report.   His  office visit with Dr. Sallyanne Kuster was reviewed as well as his most recent laboratory    ASSESSMENT:    1. Coronary artery disease involving native coronary artery of native heart without angina pectoris   2. SSS (sick sinus syndrome) ([place])   3. Pacemaker   4. Essential hypertension   5. Frequent PVCs   6. Hyperlipidemia with target LDL less than 70   7. Type 2 diabetes mellitus without complication, without long-term current use of insulin [facility])     PLAN:  Mr. Sena Clouatre is a very pleasant, active healthy-appearing 77 year old gentleman who has a long-standing history of significant exercise and  has been exercising for at least 1-2 hours per day 6 days per week.  Initially saw him he was running 5 to 6 miles at a time and was doing spin class.  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  Prior to his pacemaker cardiac catheterization revealed a 60 to 70% calcified LAD stenosis with normal LV function.  Subsequently, he has continued to be asymptomatic.  We have been very aggressive with lipid-lowering therapy and most recent LDL cholesterol is excellent at 43 on his current regimen of atorvastatin 40 mg in addition to Zetia 10 mg.  Hopefully significant LDL reduction will induce plaque regression.  He is not having any anginal symptomatology.  His blood pressure today is stable on amlodipine 5 mg in addition to lisinopril 20 mg daily.  He is diabetic on metformin 500 mg daily.  He has a 2/6 systolic murmur in the aortic region.  His previous echo in May 2018 showed an EF of 55 to 60% with out significant aortic stenosis and with a mean gradient of 9 and peak gradient of 17.    His ECG today shows an atrially paced rhythm with PVCs with transient bigeminy.  During  prolonged auscultation, I did not hear any significant ectopy.  He will continue his current medical regimen.  He is diabetic on metformin.  Recent hemoglobin A1c was increased at 7.7.  This is managed by his primary physician.  He had seen Dr. Sallyanne Kuster for pacemaker evaluation.  Most recent paced heart device check of October 29, 2017 showed normal device function.  I have recommended that in May 2020 he undergo a 2-year follow-up echo Doppler study to reassess his cardiac murmur and aortic valve disease.    Medication Adjustments/Labs and Tests Ordered: Current medicines are reviewed at length with the patient today.  Concerns regarding medicines are outlined above.  Medication changes, Labs and Tests ordered today are listed in the Patient Instructions below. Patient Instructions  Medication Instructions:  Your physician recommends that you continue on your current medications as directed. Please refer to the Current Medication list given to you today.  If you need a refill on your cardiac medications before your next appointment, please call your pharmacy.   Testing/Procedures: Your physician has requested that you have an echocardiogram in May 2020. Echocardiography is a painless test that uses sound waves to create images of your heart. It provides your doctor with information about the size and shape of your heart and how well your heart's chambers and valves are working. This procedure takes approximately one hour. There are no restrictions for this procedure.  Follow-Up: At [facility], you and your health needs are our priority.  As part of our continuing mission to provide you with exceptional heart care, we have created designated Provider Care Teams.  These Care Teams include your primary Cardiologist (physician) and Advanced Practice Providers (APPs -  Physician Assistants and Nurse Practitioners) who all work together to provide you with the care you need, when you need it. You  will need a follow up appointment in 6 months.  Please call our office 2 months in advance to schedule this appointment.  You may see Dr. Claiborne Billings or one of the following Advanced Practice Providers on your designated Care Team: [place], [place] . Fabian Sharp, PA-C        Signed, Shelva Majestic, MD  12/22/2017 8:55  [facility] [address] [place], [place], [place]  [address] Phone: [phone]

## 2017-12-21 NOTE — Patient Instructions (Signed)
Medication Instructions:  Your physician recommends that you continue on your current medications as directed. Please refer to the Current Medication list given to you today.  If you need a refill on your cardiac medications before your next appointment, please call your pharmacy.   Testing/Procedures: Your physician has requested that you have an echocardiogram in May 2020. Echocardiography is a painless test that uses sound waves to create images of your heart. It provides your doctor with information about the size and shape of your heart and how well your heart's chambers and valves are working. This procedure takes approximately one hour. There are no restrictions for this procedure.  Follow-Up: At [facility], you and your health needs are our priority.  As part of our continuing mission to provide you with exceptional heart care, we have created designated Provider Care Teams.  These Care Teams include your primary Cardiologist (physician) and Advanced Practice Providers (APPs -  Physician Assistants and Nurse Practitioners) who all work together to provide you with the care you need, when you need it. You will need a follow up appointment in 6 months.  Please call our office 2 months in advance to schedule this appointment.  You may see Dr. Tresa EndoKelly or one of the following Advanced Practice Providers on your designated Care Team: [place]ao Meng, [place]PA-[place] . Micah FlesherAngela Duke, PA-C

## 2017-12-22 ENCOUNTER — Encounter: Payer: Self-pay | Admitting: Cardiovascular Disease

## 2017-12-22 ENCOUNTER — Other Ambulatory Visit: Payer: Self-pay

## 2017-12-22 MED ORDER — LISINOPRIL 20 MG PO TABS: 20.0000 mg | ORAL_TABLET | Freq: Every day | ORAL | 3 refills | Status: DC

## 2018-01-02 ENCOUNTER — Other Ambulatory Visit: Payer: Self-pay | Admitting: Cardiovascular Disease

## 2018-01-14 LAB — CUP PACEART REMOTE DEVICE CHECK
Battery Voltage: 3.05 V
Brady Statistic AP VP Percent: 0.02 %
Brady Statistic AS VP Percent: 0.03 %
Brady Statistic RA Percent Paced: 51.75 %
Date Time Interrogation Session: 20191203060143
Implantable Lead Implant Date: 20180530
Implantable Lead Location: 753860
Implantable Lead Model: 5076
Implantable Lead Model: 5076
Implantable Pulse Generator Implant Date: 20180530
Lead Channel Impedance Value: 304 Ohm
Lead Channel Impedance Value: 380 Ohm
Lead Channel Impedance Value: 437 Ohm
Lead Channel Pacing Threshold Amplitude: 0.5 V
Lead Channel Pacing Threshold Pulse Width: 0.4 ms
Lead Channel Pacing Threshold Pulse Width: 0.4 ms
Lead Channel Sensing Intrinsic Amplitude: 19.25 mV
Lead Channel Setting Pacing Amplitude: 1.5 V
Lead Channel Setting Pacing Pulse Width: 0.4 ms
Lead Channel Setting Sensing Sensitivity: 2 mV
MDC IDC LEAD IMPLANT DT: 20180530
MDC IDC LEAD LOCATION: 753859
MDC IDC MSMT BATTERY REMAINING LONGEVITY: 152 mo
MDC IDC MSMT LEADCHNL RA IMPEDANCE VALUE: 342 Ohm
MDC IDC MSMT LEADCHNL RA SENSING INTR AMPL: 3.375 mV
MDC IDC MSMT LEADCHNL RA SENSING INTR AMPL: 3.375 mV
MDC IDC MSMT LEADCHNL RV PACING THRESHOLD AMPLITUDE: 0.875 V
MDC IDC MSMT LEADCHNL RV SENSING INTR AMPL: 19.25 mV
MDC IDC SET LEADCHNL RV PACING AMPLITUDE: 2 V
MDC IDC STAT BRADY AP VS PERCENT: 50.88 %
MDC IDC STAT BRADY AS VS PERCENT: 49.07 %
MDC IDC STAT BRADY RV PERCENT PACED: 0.05 %

## 2018-01-17 NOTE — Progress Notes (Signed)
Tawana ScaleZach Naraine D.O. [facility] [address] [place], [place] [address] Phone: [phone] Subjective:   Travis [name], Valerie Wolf, am serving as a scribe for Dr. Antoine PrimasZachary Castner.  I'm seeing this patient by the request  of:    CC: Left knee follow-up  NFA:[id number]:Subjective  Travis MingsJames F [name] is a 78 y.o. male coming in with complaint of left knee pain. Has felt improvement since last visit. Intermittent achy pain remains after sitting. Does not have any pain with running. Does still wear brace when running.  Was found to have a small patella fracture but states that it is not having any anterior pain.  Notices some mild pain that secondary to his arthritis but nothing severe.  No instability.  Has been able to run on a more regular basis again.     Past Medical History:  Diagnosis Date  . Heart murmur    "an innocent one" (06/10/2016)  . History of blood transfusion ~ 1956   "while I was in hospital; don't remember why"  . History of kidney stones   . Hyperlipidemia   . Hypertension   . 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 SSS (sick sinus syndrome) (HCC) 06/10/2016  . Type II diabetes mellitus (HCC)    Past Surgical History:  Procedure Laterality Date  . APPENDECTOMY    . CARDIAC CATHETERIZATION  06/10/2016  . CATARACT EXTRACTION W/ INTRAOCULAR LENS IMPLANT Right   . CYSTOSCOPY W/ STONE MANIPULATION    . INSERT / REPLACE / REMOVE PACEMAKER  06/10/2016   dual chamber permanent pacemaker  . KNEE ARTHROSCOPY W/ MENISCECTOMY Bilateral   . LAPAROSCOPIC CHOLECYSTECTOMY    . LEFT HEART CATH AND CORONARY ANGIOGRAPHY N/A 06/10/2016   Procedure: Left Heart Cath and Coronary Angiography;  Surgeon: Dolores PattyBensimhon, Daniel R, MD;  [facility];  Service: Cardiovascular;  Laterality: N/A;  . LITHOTRIPSY    . PACEMAKER IMPLANT N/A 06/10/2016   Procedure: Pacemaker Implant;  Surgeon: Thurmon Fairroitoru, Mihai, MD;  Location: [facility] INVASIVE CV LAB;  Service: Cardiovascular;  Laterality:  N/A;  . TONSILLECTOMY     Social History   Socioeconomic History  . Marital status: Married    Spouse name: Not on file  . Number of children: Not on file  . Years of education: Not on file  . Highest education level: Not on file  Occupational History  . Not on file  Social Needs  . Financial resource strain: Not on file  . Food insecurity:    Worry: Not on file    Inability: Not on file  . Transportation needs:    Medical: Not on file    Non-medical: Not on file  Tobacco Use  . Smoking status: Former Smoker    Packs/day: 0.50    Years: 25.00    Pack years: 12.50    Types: Cigarettes    Last attempt to quit: 1983    Years since quitting: 37.0  . Smokeless tobacco: Never Used  Substance and Sexual Activity  . Alcohol use: Yes    Comment: 06/10/2016 "might have 2 beers/month"  . Drug use: No  . Sexual activity: Not on file  Lifestyle  . Physical activity:    Days per week: Not on file    Minutes per session: Not on file  . Stress: Not on file  Relationships  . Social connections:    Talks on phone: Not on file    Gets together: Not on file    Attends  religious service: Not on file    Active member of club or organization: Not on file    Attends meetings of clubs or organizations: Not on file    Relationship status: Not on file  Other Topics Concern  . Not on file  Social History Narrative  . Not on file   No Known Allergies Family History  Problem Relation Age of Onset  . Colon cancer Father     Current Outpatient Medications (Endocrine & Metabolic):  .  metFORMIN (GLUCOPHAGE) 500 MG tablet, Take 500 mg by mouth 2 (two) times daily with a meal.   Current Outpatient Medications (Cardiovascular):  .  amLODipine (NORVASC) 5 MG tablet, TAKE 1 TABLET BY MOUTH DAILY. Marland Kitchen  atorvastatin (LIPITOR) 40 MG tablet, TAKE 1 TABLET ONCE DAILY. Marland Kitchen  ezetimibe (ZETIA) 10 MG tablet, TAKE 1 TABLET ONCE DAILY. Marland Kitchen  lisinopril (PRINIVIL,ZESTRIL) 20 MG tablet, Take 1 tablet (20 mg  total) by mouth daily.   Current Outpatient Medications (Analgesics):  .  aspirin EC 81 MG tablet, Take 81 mg by mouth daily.   Current Outpatient Medications (Other):  Marland Kitchen  Cholecalciferol (VITAMIN D3) 2000 units capsule, Take 2,000 Units by mouth daily. .  COMBIGAN 0.2-0.5 % ophthalmic solution, Place 1 drop into both eyes 2 (two) times daily.     Past medical history, social, surgical and family history all reviewed in electronic medical record.  No pertanent information unless stated regarding to the chief complaint.   Review of Systems:  No headache, visual changes, nausea, vomiting, diarrhea, constipation, dizziness, abdominal pain, skin rash, fevers, chills, night sweats, weight loss, swollen lymph nodes, body aches, joint swelling, muscle aches, chest pain, shortness of breath, mood changes.   Objective  Blood pressure 118/62, height 5\' 11"  (1.803 m), weight 165 lb (74.8 kg), SpO2 97 %.   General: No apparent distress alert and oriented x3 mood and affect normal, dressed appropriately.  HEENT: Pupils equal, extraocular movements intact  Respiratory: Patient's speak in full sentences and does not appear short of breath  Cardiovascular: No lower extremity edema, non tender, no erythema  Skin: Warm dry intact with no signs of infection or rash on extremities or on axial skeleton.  Abdomen: Soft nontender  Neuro: Cranial nerves II through XII are intact, neurovascularly intact in all extremities with 2+ DTRs and 2+ pulses.  Lymph: No lymphadenopathy of posterior or anterior cervical chain or axillae bilaterally.  Gait normal with good balance and coordination.  MSK:  Non tender with full range of motion and good stability and symmetric strength and tone of shoulders, elbows, wrist, hip and ankles bilaterally.  Left knee exam shows the patient does have some arthritic changes.  Nontender over the patella.  Full range of motion.  No swelling at this time.  Minimal instability noted  with varus force but very minimal overall.     Impression and Recommendations:      The above documentation has been reviewed and is accurate and complete Travis Saa, DO       Note: This dictation was prepared with Dragon dictation along with smaller phrase technology. Any transcriptional errors that result from this process are unintentional.

## 2018-01-18 ENCOUNTER — Encounter: Payer: Self-pay | Admitting: Family Medicine

## 2018-01-18 ENCOUNTER — Ambulatory Visit (INDEPENDENT_AMBULATORY_CARE_PROVIDER_SITE_OTHER): Payer: Medicare Other | Admitting: Family Medicine

## 2018-01-18 DIAGNOSIS — S82092D Other fracture of left patella, subsequent encounter for closed fracture with routine healing: Secondary | ICD-10-CM | POA: Diagnosis not present

## 2018-01-18 NOTE — Patient Instructions (Signed)
Good to see you  pennsaid pinkie amount topically 2 times daily as needed.  Ice after running  Stay active See me again in 7-8 weeks if not perfect otherwise see me when you need me

## 2018-01-18 NOTE — Assessment & Plan Note (Signed)
Healed at this time.  No concern at this point.  Can increase activity as tolerated.  Follow-up with me again in 4 to 8 weeks if any worsening pain but otherwise follow-up as needed

## 2018-02-08 ENCOUNTER — Other Ambulatory Visit: Payer: Self-pay | Admitting: Cardiovascular Disease

## 2018-02-10 ENCOUNTER — Ambulatory Visit: Payer: Medicare Other | Admitting: Podiatry

## 2018-02-25 ENCOUNTER — Ambulatory Visit (INDEPENDENT_AMBULATORY_CARE_PROVIDER_SITE_OTHER): Payer: Medicare Other | Admitting: Podiatry

## 2018-02-25 ENCOUNTER — Encounter: Payer: Self-pay | Admitting: Podiatry

## 2018-02-25 DIAGNOSIS — M79674 Pain in right toe(s): Secondary | ICD-10-CM | POA: Diagnosis not present

## 2018-02-25 DIAGNOSIS — L84 Corns and callosities: Secondary | ICD-10-CM

## 2018-02-25 DIAGNOSIS — B351 Tinea unguium: Secondary | ICD-10-CM | POA: Diagnosis not present

## 2018-02-25 DIAGNOSIS — E118 Type 2 diabetes mellitus with unspecified complications: Secondary | ICD-10-CM | POA: Diagnosis not present

## 2018-02-25 DIAGNOSIS — M79675 Pain in left toe(s): Secondary | ICD-10-CM

## 2018-02-25 NOTE — Patient Instructions (Signed)

## 2018-02-28 ENCOUNTER — Ambulatory Visit (INDEPENDENT_AMBULATORY_CARE_PROVIDER_SITE_OTHER): Payer: Medicare Other | Admitting: *Deleted

## 2018-02-28 DIAGNOSIS — R001 Bradycardia, unspecified: Secondary | ICD-10-CM

## 2018-02-28 NOTE — Progress Notes (Addendum)
Subjective: Travis [name] presents today with painful, thick toenails 1-5 b/l that he cannot cut and which interfere with daily activities.  Pain is aggravated when wearing enclosed shoe gear.  Lorenda Ishihara, MD is his PCP.    Current Outpatient Medications:  .  amLODipine (NORVASC) 5 MG tablet, TAKE 1 TABLET BY MOUTH DAILY., Disp: 90 tablet, Rfl: 3 .  aspirin EC 81 MG tablet, Take 81 mg by mouth daily., Disp: , Rfl:  .  atorvastatin (LIPITOR) 40 MG tablet, TAKE 1 TABLET ONCE DAILY., Disp: 90 tablet, Rfl: 4 .  Cholecalciferol (VITAMIN D3) 2000 units capsule, Take 2,000 Units by mouth daily., Disp: , Rfl:  .  COMBIGAN 0.2-0.5 % ophthalmic solution, Place 1 drop into both eyes 2 (two) times daily. , Disp: , Rfl:  .  ezetimibe (ZETIA) 10 MG tablet, TAKE 1 TABLET ONCE DAILY., Disp: 90 tablet, Rfl: 1 .  lisinopril (PRINIVIL,ZESTRIL) 20 MG tablet, Take 1 tablet (20 mg total) by mouth daily., Disp: 90 tablet, Rfl: 3 .  metFORMIN (GLUCOPHAGE) 500 MG tablet, Take 500 mg by mouth 2 (two) times daily with a meal. , Disp: , Rfl:   No Known Allergies  Objective:  Vascular Examination: Capillary refill time <3 seconds  x 10 digits  Dorsalis pedis and Posterior tibial pulses palpable b/l  Digital hair present x 10 digits  Skin temperature gradient WNL b/l  Dermatological Examination: Skin with normal turgor, texture and tone b/l  Toenails 1-5 b/l discolored, thick, dystrophic with subungual debris and pain with palpation to nailbeds due to thickness of nails.  Hyperkeratotic lesion submet head 1, 5 b/l . No erythema, no edema, no drainage, no flocculcence.  Musculoskeletal: Muscle strength 5/5 to all LE muscle groups  No gross bony deformities b/l.  No pain, crepitus or joint limitation noted with ROM.   Neurological: Sensation intact with 10 gram monofilament.  Vibratory sensation intact.  Assessment: 1. 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 Calluses submet head 1, 5  b/l  3. NIDDM  Plan: 1. Toenails 1-5 b/l were debrided in length and girth without iatrogenic bleeding. Calluses pared submetatarsal head(s) 1, 5 b/l  2. Patient to continue soft, supportive shoe gear 3. Patient to report any pedal injuries to medical professional immediately. 4. Follow up 3 months. Patient/POA to call should there be a concern in the interim.

## 2018-03-03 LAB — CUP PACEART INCLINIC DEVICE CHECK
Battery Remaining Longevity: 150 mo
Brady Statistic AP VP Percent: 0.02 %
Brady Statistic AS VP Percent: 0.03 %
Brady Statistic RA Percent Paced: 44.7 %
Implantable Lead Implant Date: 20180530
Implantable Lead Location: 753860
Implantable Lead Model: 5076
Implantable Lead Model: 5076
Implantable Pulse Generator Implant Date: 20180530
Lead Channel Impedance Value: 304 Ohm
Lead Channel Impedance Value: 342 Ohm
Lead Channel Impedance Value: 380 Ohm
Lead Channel Pacing Threshold Amplitude: 0.5 V
Lead Channel Pacing Threshold Amplitude: 0.75 V
Lead Channel Pacing Threshold Pulse Width: 0.4 ms
Lead Channel Pacing Threshold Pulse Width: 0.4 ms
Lead Channel Sensing Intrinsic Amplitude: 17.375 mV
Lead Channel Setting Pacing Amplitude: 1.5 V
Lead Channel Setting Pacing Pulse Width: 0.4 ms
MDC IDC LEAD IMPLANT DT: 20180530
MDC IDC LEAD LOCATION: 753859
MDC IDC MSMT BATTERY VOLTAGE: 3.04 V
MDC IDC MSMT LEADCHNL RA SENSING INTR AMPL: 3.625 mV
MDC IDC MSMT LEADCHNL RA SENSING INTR AMPL: 3.625 mV
MDC IDC MSMT LEADCHNL RV IMPEDANCE VALUE: 418 Ohm
MDC IDC MSMT LEADCHNL RV SENSING INTR AMPL: 17.375 mV
MDC IDC SESS DTM: 20200217171103
MDC IDC SET LEADCHNL RV PACING AMPLITUDE: 2 V
MDC IDC SET LEADCHNL RV SENSING SENSITIVITY: 2 mV
MDC IDC STAT BRADY AP VS PERCENT: 43.53 %
MDC IDC STAT BRADY AS VS PERCENT: 56.42 %
MDC IDC STAT BRADY RV PERCENT PACED: 0.05 %

## 2018-03-03 NOTE — Progress Notes (Signed)
Sensor adjustments made: ADL rate from 100bpm to 105bpm, ADL response rate 4, exertion response 5

## 2018-03-15 ENCOUNTER — Ambulatory Visit: Payer: Medicare Other | Admitting: Family Medicine

## 2018-03-15 ENCOUNTER — Ambulatory Visit (INDEPENDENT_AMBULATORY_CARE_PROVIDER_SITE_OTHER): Payer: Medicare Other | Admitting: *Deleted

## 2018-03-15 DIAGNOSIS — I495 Sick sinus syndrome: Secondary | ICD-10-CM | POA: Diagnosis not present

## 2018-03-16 ENCOUNTER — Telehealth: Payer: Self-pay

## 2018-03-16 NOTE — Telephone Encounter (Signed)
Left message for patient to remind of missed remote transmission.  

## 2018-03-17 LAB — CUP PACEART REMOTE DEVICE CHECK
Battery Remaining Longevity: 150 mo
Battery Voltage: 3.04 V
Brady Statistic AP VS Percent: 57.99 %
Date Time Interrogation Session: 20200304202954
Implantable Lead Implant Date: 20180530
Implantable Lead Implant Date: 20180530
Implantable Lead Location: 753860
Implantable Pulse Generator Implant Date: 20180530
Lead Channel Pacing Threshold Amplitude: 0.5 V
Lead Channel Pacing Threshold Pulse Width: 0.4 ms
Lead Channel Sensing Intrinsic Amplitude: 18.5 mV
Lead Channel Sensing Intrinsic Amplitude: 3.5 mV
Lead Channel Sensing Intrinsic Amplitude: 3.5 mV
Lead Channel Setting Pacing Amplitude: 1.5 V
Lead Channel Setting Pacing Amplitude: 2 V
Lead Channel Setting Sensing Sensitivity: 2 mV
MDC IDC LEAD LOCATION: 753859
MDC IDC MSMT LEADCHNL RA IMPEDANCE VALUE: 285 Ohm
MDC IDC MSMT LEADCHNL RA IMPEDANCE VALUE: 342 Ohm
MDC IDC MSMT LEADCHNL RV IMPEDANCE VALUE: 380 Ohm
MDC IDC MSMT LEADCHNL RV IMPEDANCE VALUE: 437 Ohm
MDC IDC MSMT LEADCHNL RV PACING THRESHOLD AMPLITUDE: 0.75 V
MDC IDC MSMT LEADCHNL RV PACING THRESHOLD PULSEWIDTH: 0.4 ms
MDC IDC MSMT LEADCHNL RV SENSING INTR AMPL: 18.5 mV
MDC IDC SET LEADCHNL RV PACING PULSEWIDTH: 0.4 ms
MDC IDC STAT BRADY AP VP PERCENT: 0.01 %
MDC IDC STAT BRADY AS VP PERCENT: 0.03 %
MDC IDC STAT BRADY AS VS PERCENT: 41.96 %
MDC IDC STAT BRADY RA PERCENT PACED: 60.32 %
MDC IDC STAT BRADY RV PERCENT PACED: 0.05 %

## 2018-03-22 ENCOUNTER — Telehealth: Payer: Self-pay | Admitting: Cardiovascular Disease

## 2018-03-22 NOTE — Telephone Encounter (Signed)
Called patient, LVM advising on how he was feeling. Left call back number.

## 2018-03-22 NOTE — Telephone Encounter (Addendum)
Spoke to patient. Patient states symptoms started this past Sunday and has become worse.  Patient he became dizzy last night when he got up to go to the bathroom.  Per patient no other symptoms.  Patient states he thought  This maybe  related to  Pacer adjustment  on 02/28/18. Patient states he has not had this kind ot symptoms  Since prior to having  Pacer implant. Patient states he did send a transmission @3 /4  suggest to contact primary - for symptoms and treatment -  Do not think symptoms are due to pacer maker.   patient verbalized understanding.

## 2018-03-22 NOTE — Telephone Encounter (Signed)
Pt c/o BP issue: STAT if pt c/o blurred vision, one-sided weakness or slurred speech  1. What are your last 5 BP readings? 166/90,144/76,141/78   2. Are you having any other symptoms (ex. Dizziness, headache, blurred vision, passed out)? Dizziness,vertigo  3. What is your BP issue? Elevated blood pressure yesterday  STAT if patient feels like he/she is going to faint   1) Are you dizzy now? Since Sunday. Sitting is OK, sometimes when lying in bed. Sudden movements make him the dizziest.   2) Do you feel faint or have you passed out? no  3) Do you have any other symptoms? vertigo  4) Have you checked your HR and BP (record if available)?  166/90,144/76,141/78    Pt has not felt dizzy like this since he had his pacemaker put in. He just had his pacemaker adjusted, and was wondering if the dizziness was a result of the adjustment wanted to see if he could get in soon.

## 2018-03-23 ENCOUNTER — Encounter: Payer: Self-pay | Admitting: Cardiology

## 2018-03-23 NOTE — Progress Notes (Signed)
Remote pacemaker transmission.   

## 2018-04-12 DIAGNOSIS — H409 Unspecified glaucoma: Secondary | ICD-10-CM | POA: Insufficient documentation

## 2018-04-12 DIAGNOSIS — M1990 Unspecified osteoarthritis, unspecified site: Secondary | ICD-10-CM | POA: Insufficient documentation

## 2018-04-12 DIAGNOSIS — E7800 Pure hypercholesterolemia, unspecified: Secondary | ICD-10-CM | POA: Insufficient documentation

## 2018-04-12 DIAGNOSIS — Z87442 Personal history of urinary calculi: Secondary | ICD-10-CM | POA: Insufficient documentation

## 2018-04-12 DIAGNOSIS — I1310 Hypertensive heart and chronic kidney disease without heart failure, with stage 1 through stage 4 chronic kidney disease, or unspecified chronic kidney disease: Secondary | ICD-10-CM | POA: Insufficient documentation

## 2018-04-12 DIAGNOSIS — H9190 Unspecified hearing loss, unspecified ear: Secondary | ICD-10-CM | POA: Insufficient documentation

## 2018-05-20 ENCOUNTER — Telehealth (HOSPITAL_COMMUNITY): Payer: Self-pay

## 2018-05-20 NOTE — Telephone Encounter (Signed)

## 2018-05-24 ENCOUNTER — Other Ambulatory Visit: Payer: Self-pay

## 2018-05-24 ENCOUNTER — Ambulatory Visit (HOSPITAL_COMMUNITY): Payer: Medicare Other | Attending: Cardiology

## 2018-05-24 DIAGNOSIS — I2510 Atherosclerotic heart disease of native coronary artery without angina pectoris: Secondary | ICD-10-CM

## 2018-05-31 ENCOUNTER — Ambulatory Visit (INDEPENDENT_AMBULATORY_CARE_PROVIDER_SITE_OTHER): Payer: Medicare Other | Admitting: Podiatry

## 2018-05-31 ENCOUNTER — Other Ambulatory Visit: Payer: Self-pay

## 2018-05-31 ENCOUNTER — Encounter: Payer: Self-pay | Admitting: Podiatry

## 2018-05-31 VITALS — Temp 97.7°F

## 2018-05-31 DIAGNOSIS — M79675 Pain in left toe(s): Secondary | ICD-10-CM

## 2018-05-31 DIAGNOSIS — B351 Tinea unguium: Secondary | ICD-10-CM

## 2018-05-31 DIAGNOSIS — L84 Corns and callosities: Secondary | ICD-10-CM

## 2018-05-31 DIAGNOSIS — E119 Type 2 diabetes mellitus without complications: Secondary | ICD-10-CM

## 2018-05-31 DIAGNOSIS — M79674 Pain in right toe(s): Secondary | ICD-10-CM | POA: Diagnosis not present

## 2018-05-31 NOTE — Progress Notes (Signed)
Subjective: Travis [name] is a 78 y.o. y.o. male who presents for preventative diabetic foot care today with cc of painful, discolored, thick toenails chronic callosities which interfere with daily activities. Pain is aggravated when wearing enclosed shoe gear and relieved with periodic professional debridement.  Travis [name], Travis Koh, MD is his PCP and last visit was April 1st via video-conferencing.  Mr. Travis [name] states he still runs every morning for exercise. He states several local races he's participated in in the past have been canceled due to COVID-19.  He voices no new pedal problems on today's visit.   Current Outpatient Medications:  .  amLODipine (NORVASC) 5 MG tablet, TAKE 1 TABLET BY MOUTH DAILY., Disp: 90 tablet, Rfl: 3 .  aspirin EC 81 MG tablet, Take 81 mg by mouth daily., Disp: , Rfl:  .  atorvastatin (LIPITOR) 40 MG tablet, TAKE 1 TABLET ONCE DAILY., Disp: 90 tablet, Rfl: 4 .  Cholecalciferol (VITAMIN D3) 2000 units capsule, Take 2,000 Units by mouth daily., Disp: , Rfl:  .  COMBIGAN 0.2-0.5 % ophthalmic solution, Place 1 drop into both eyes 2 (two) times daily. , Disp: , Rfl:  .  ezetimibe (ZETIA) 10 MG tablet, TAKE 1 TABLET ONCE DAILY., Disp: 90 tablet, Rfl: 1 .  lisinopril (PRINIVIL,ZESTRIL) 20 MG tablet, Take 1 tablet (20 mg total) by mouth daily., Disp: 90 tablet, Rfl: 3 .  metFORMIN (GLUCOPHAGE) 500 MG tablet, Take 500 mg by mouth 2 (two) times daily with a meal. , Disp: , Rfl:  .  prednisoLONE acetate (PRED FORTE) 1 % ophthalmic suspension, , Disp: , Rfl:   No Known Allergies  Objective: Vitals:   05/31/18 1326  Temp: 97.7 F (36.5 C)   Vascular Examination: Capillary refill time <3 seconds x 10 digits.  Dorsalis pedis pulses palpable b/l.  Posterior tibial pulses palpable b/l.  Digital hair present x 10 digits.  Skin temperature gradient WNL b/l.  Dermatological Examination: Skin with normal turgor, texture and tone b/l.  Toenails 1-5 b/l discolored,  thick, dystrophic with subungual debris and pain with palpation to nailbeds due to thickness of nails.  Hyperkeratotic lesions submet heads 1, 5 b/l. No erythema, no edema, no drainage, no flocculence noted.   Musculoskeletal: Muscle strength 5/5 to all LE muscle groups.  Neurological: Sensation intact with 10 gram monofilament.  Vibratory sensation intact.  Hemoglobin A1C Latest Ref Rng & Units 12/17/2017  HGBA1C 4.8 - 5.6 % 7.7(H)  Some recent data might be hidden    Assessment: 1.  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  Callus submet heads 1, 5 b/l 3.  NIDDM  Plan: 1. Continue diabetic foot care principles. Literature dispensed on today. 2. Toenails 1-5 b/l were debrided in length and girth without iatrogenic bleeding. 3. Hyperkeratotic lesion(s) submet heads 1, 5 b/l  pared with sterile scalpel blade without incident.  4. Patient to continue soft, supportive shoe gear daily. 5. Patient to report any pedal injuries to medical professional immediately. 6. Follow up 3 months.  7. Patient/POA to call should there be a concern in the interim.

## 2018-05-31 NOTE — Patient Instructions (Signed)
Diabetes Mellitus and Foot Care Foot care is an important part of your health, especially when you have diabetes. Diabetes may cause you to have problems because of poor blood flow (circulation) to your feet and legs, which can cause your skin to:  Become thinner and drier.  Break more easily.  Heal more slowly.  Peel and crack. You may also have nerve damage (neuropathy) in your legs and feet, causing decreased feeling in them. This means that you may not notice minor injuries to your feet that could lead to more serious problems. Noticing and addressing any potential problems early is the best way to prevent future foot problems. How to care for your feet Foot hygiene  Wash your feet daily with warm water and mild soap. Do not use hot water. Then, pat your feet and the areas between your toes until they are completely dry. Do not soak your feet as this can dry your skin.  Trim your toenails straight across. Do not dig under them or around the cuticle. File the edges of your nails with an emery board or nail file.  Apply a moisturizing lotion or petroleum jelly to the skin on your feet and to dry, brittle toenails. Use lotion that does not contain alcohol and is unscented. Do not apply lotion between your toes. Shoes and socks  Wear clean socks or stockings every day. Make sure they are not too tight. Do not wear knee-high stockings since they may decrease blood flow to your legs.  Wear shoes that fit properly and have enough cushioning. Always look in your shoes before you put them on to be sure there are no objects inside.  To break in new shoes, wear them for just a few hours a day. This prevents injuries on your feet. Wounds, scrapes, corns, and calluses  Check your feet daily for blisters, cuts, bruises, sores, and redness. If you cannot see the bottom of your feet, use a mirror or ask someone for help.  Do not cut corns or calluses or try to remove them with medicine.  If you  find a minor scrape, cut, or break in the skin on your feet, keep it and the skin around it clean and dry. You may clean these areas with mild soap and water. Do not clean the area with peroxide, alcohol, or iodine.  If you have a wound, scrape, corn, or callus on your foot, look at it several times a day to make sure it is healing and not infected. Check for: ? Redness, swelling, or pain. ? Fluid or blood. ? Warmth. ? Pus or a bad smell. General instructions  Do not cross your legs. This may decrease blood flow to your feet.  Do not use heating pads or hot water bottles on your feet. They may burn your skin. If you have lost feeling in your feet or legs, you may not know this is happening until it is too late.  Protect your feet from hot and cold by wearing shoes, such as at the beach or on hot pavement.  Schedule a complete foot exam at least once a year (annually) or more often if you have foot problems. If you have foot problems, report any cuts, sores, or bruises to your health care provider immediately. Contact a health care provider if:  You have a medical condition that increases your risk of infection and you have any cuts, sores, or bruises on your feet.  You have an injury that is not   healing.  You have redness on your legs or feet.  You feel burning or tingling in your legs or feet.  You have pain or cramps in your legs and feet.  Your legs or feet are numb.  Your feet always feel cold.  You have pain around a toenail. Get help right away if:  You have a wound, scrape, corn, or callus on your foot and: ? You have pain, swelling, or redness that gets worse. ? You have fluid or blood coming from the wound, scrape, corn, or callus. ? Your wound, scrape, corn, or callus feels warm to the touch. ? You have pus or a bad smell coming from the wound, scrape, corn, or callus. ? You have a fever. ? You have a red line going up your leg. Summary  Check your feet every day  for cuts, sores, red spots, swelling, and blisters.  Moisturize feet and legs daily.  Wear shoes that fit properly and have enough cushioning.  If you have foot problems, report any cuts, sores, or bruises to your health care provider immediately.  Schedule a complete foot exam at least once a year (annually) or more often if you have foot problems. This information is not intended to replace advice given to you by your health care provider. Make sure you discuss any questions you have with your health care provider. Document Released: 12/27/1999 Document Revised: 02/10/2017 Document Reviewed: 01/31/2016 Elsevier Interactive Patient Education  2019 [employer] Inc.  Onychomycosis/Fungal Toenails  WHAT IS IT? An infection that lies within the keratin of your nail plate that is caused by a fungus.  WHY ME? Fungal infections affect all ages, sexes, races, and creeds.  There may be many factors that predispose you to a fungal infection such as age, coexisting medical conditions such as diabetes, or an autoimmune disease; stress, medications, fatigue, genetics, etc.  Bottom line: fungus thrives in a warm, moist environment and your shoes offer such a location.  IS IT CONTAGIOUS? Theoretically, yes.  You do not want to share shoes, nail clippers or files with someone who has fungal toenails.  Walking around barefoot in the same room or sleeping in the same bed is unlikely to transfer the organism.  It is important to realize, however, that fungus can spread easily from one nail to the next on the same foot.  HOW DO WE TREAT THIS?  There are several ways to treat this condition.  Treatment may depend on many factors such as age, medications, pregnancy, liver and kidney conditions, etc.  It is best to ask your doctor which options are available to you.  1. No treatment.   Unlike many other medical concerns, you can live with this condition.  However for many people this can be a painful condition and  may lead to ingrown toenails or a bacterial infection.  It is recommended that you keep the nails cut short to help reduce the amount of fungal nail. 2. Topical treatment.  These range from herbal remedies to prescription strength nail lacquers.  About 40-50% effective, topicals require twice daily application for approximately 9 to 12 months or until an entirely new nail has grown out.  The most effective topicals are medical grade medications available through physicians offices. 3. Oral antifungal medications.  With an 80-90% cure rate, the most common oral medication requires 3 to 4 months of therapy and stays in your system for a year as the new nail grows out.  Oral antifungal medications do require   blood work to make sure it is a safe drug for you.  A liver function panel will be performed prior to starting the medication and after the first month of treatment.  It is important to have the blood work performed to avoid any harmful side effects.  In general, this medication safe but blood work is required. 4. Laser Therapy.  This treatment is performed by applying a specialized laser to the affected nail plate.  This therapy is noninvasive, fast, and non-painful.  It is not covered by insurance and is therefore, out of pocket.  The results have been very good with a 80-95% cure rate.  The [facility] is the only practice in the area to offer this therapy. 5. Permanent Nail Avulsion.  Removing the entire nail so that a new nail will not grow back. 

## 2018-06-08 ENCOUNTER — Telehealth: Payer: Self-pay | Admitting: Cardiovascular Disease

## 2018-06-08 NOTE — Telephone Encounter (Signed)
New Message    Pt is calling and wondering if he needs labs before his appt    Please call

## 2018-06-08 NOTE — Telephone Encounter (Signed)
Patient had lipid, CMET, A1c in Dec 2019 Does he need any labs prior to June appointment?

## 2018-06-13 NOTE — Telephone Encounter (Signed)
Per TK okay to see him in office and then decide.  I will call patient to notify.

## 2018-06-14 ENCOUNTER — Ambulatory Visit (INDEPENDENT_AMBULATORY_CARE_PROVIDER_SITE_OTHER): Payer: Medicare Other | Admitting: *Deleted

## 2018-06-14 DIAGNOSIS — I495 Sick sinus syndrome: Secondary | ICD-10-CM

## 2018-06-14 NOTE — Telephone Encounter (Signed)
Called patient and notified no need for labs before appointment. Patient verbalized understanding.

## 2018-06-15 ENCOUNTER — Telehealth: Payer: Self-pay | Admitting: Cardiovascular Disease

## 2018-06-15 LAB — CUP PACEART REMOTE DEVICE CHECK
Battery Remaining Longevity: 147 mo
Battery Voltage: 3.03 V
Brady Statistic AP VP Percent: 0.05 %
Brady Statistic AP VS Percent: 67.66 %
Brady Statistic AS VP Percent: 0.03 %
Brady Statistic AS VS Percent: 32.26 %
Brady Statistic RA Percent Paced: 69.34 %
Brady Statistic RV Percent Paced: 0.08 %
Date Time Interrogation Session: 20200603104107
Implantable Lead Implant Date: 20180530
Implantable Lead Implant Date: 20180530
Implantable Lead Location: 753859
Implantable Lead Location: 753860
Implantable Lead Model: 5076
Implantable Lead Model: 5076
Implantable Pulse Generator Implant Date: 20180530
Lead Channel Impedance Value: 323 Ohm
Lead Channel Impedance Value: 361 Ohm
Lead Channel Impedance Value: 399 Ohm
Lead Channel Impedance Value: 437 Ohm
Lead Channel Pacing Threshold Amplitude: 0.5 V
Lead Channel Pacing Threshold Amplitude: 0.75 V
Lead Channel Pacing Threshold Pulse Width: 0.4 ms
Lead Channel Pacing Threshold Pulse Width: 0.4 ms
Lead Channel Sensing Intrinsic Amplitude: 17.625 mV
Lead Channel Sensing Intrinsic Amplitude: 17.625 mV
Lead Channel Sensing Intrinsic Amplitude: 4.125 mV
Lead Channel Sensing Intrinsic Amplitude: 4.125 mV
Lead Channel Setting Pacing Amplitude: 1.5 V
Lead Channel Setting Pacing Amplitude: 2 V
Lead Channel Setting Pacing Pulse Width: 0.4 ms
Lead Channel Setting Sensing Sensitivity: 2 mV

## 2018-06-15 NOTE — Telephone Encounter (Signed)
Spoke with patient. Confirmed that remote transmission was received this morning. Pt reports  he has not had any issues and is feeling "fine''.

## 2018-06-15 NOTE — Telephone Encounter (Signed)
LMOM. Returning pt call.

## 2018-06-15 NOTE — Telephone Encounter (Signed)
Follow up ° ° °Patient is returning your call. Please call. ° ° ° °

## 2018-06-15 NOTE — Telephone Encounter (Signed)
New Message:     Pt says he need to talk to somebody in [facility] please.

## 2018-06-22 ENCOUNTER — Telehealth: Payer: Self-pay | Admitting: Cardiovascular Disease

## 2018-06-22 ENCOUNTER — Encounter: Payer: Self-pay | Admitting: Cardiology

## 2018-06-22 NOTE — Telephone Encounter (Signed)
smartphone/ consent/ my chart active/ pre reg completed °

## 2018-06-22 NOTE — Progress Notes (Signed)
Remote pacemaker transmission.   

## 2018-06-27 NOTE — Progress Notes (Signed)
Virtual Visit via Video Note   This visit type was conducted due to national recommendations for restrictions regarding the COVID-19 Pandemic (e.g. social distancing) in an effort to limit this patient's exposure and mitigate transmission in our community.  Due to his co-morbid illnesses, this patient is at least at moderate risk for complications without adequate follow up.  This format is felt to be most appropriate for this patient at this time.  All issues noted in this document were discussed and addressed.  A limited physical exam was performed with this format.  Please refer to the patient's chart for his consent to telehealth for [facility].   Date:  06/28/2018   ID:  Rosilyn Mings, DOB 1940/08/16, MRN [id number]  Patient Location: Home Provider Location: Office  PCP:  Gaspar Garbe, MD  Cardiologist: Nicki Guadalajara, MD Electrophysiologist:  None   Evaluation Performed:  New Patient Evaluation  Chief Complaint: 72-month follow-up evaluation  History of Present Illness:    Travis [name] is a 78 y.o. male who  has been active for his entire life.  He has a history of hypertension, hyperlipidemia, and has been diagnosed with type 2 diabetes mellitus for 14 years.  He exercises at least 6 days per week for 1-2 hours.  He does resistance training, spin classes, and typically runs anywhere from 5-6 miles at a 9-10 minute mile pace and has been doing so for many years.  His pulse typically runs in the low 30s.  Recently, he has begun to notice that he becomes dizzy when he starts to run.  This is associated with mild shortness of breath.  He denies any chest pain.  The symptoms have occurred since January.  He denies any frank syncope.  He denies the lightheaded symptoms when he is at rest and his symptoms only occur with significant activity.  When he does spin class he is unaware of any  symptoms.    When I saw for initial evaluation, his ECG revealed marked sinus bradycardia with  ventricular rate averaging 37 bpm.  PR interval was 190 ms and QTc interval was 335 ms.  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  He had a 2/6 systolic murmur on physical exam, suggestive of possible aortic valve disease.  I scheduled him for a 2-D echo Doppler study which was done on 06/02/2016.  This showed an ejection fraction at 55-65%.  There was mild LVH.  He did not have any regional wall motion abnormalities.  His aortic valve was reported as structurally normal.  There was a mean gradient of 9 and a peak gradient of 17.  There was mild MR and mild LA dilation.  His right atrium was mild moderately dilated.  I obtain a magnesium level, which was normal.  I scheduled him for an exercise nuclear stress test to assess both chronotropic competence to exercise as well as myocardial perfusion.  On his exercise portion.  His resting heart rate was 34 bpm, which rose to a maximum of 126/m.  He completed 2 minutes and 2 seconds into stage III of Bruce protocol.  He was able to augment his heart rate appropriately with exercise.  At the end of stage I reflective of good aerobic conditioning ventricular rate was 71, at the end of stage II 100, and in stage III maximum heart rate was 125.  He was entirely asymptomatic but had an exaggerated blood pressure response  opted to 16/75 and at peak stress developed asymptomatic ST segment depression up to 3 mm in the inferior and inferolateral leads.  There was a very small mild distal anteroseptal defect which most likely was artifact.  There was no ischemia identified.  He has been wearing an event monitor.  We werereceivingdaily notifications demonstrating marked bradycardia with heart rates in the 30s.  These have been sinus rhythm.  He has been entirely asymptomatic.  He had developed sinus pauses up to 5 - 6 seconds.  He remained entirely asymptomatic and specifically denied any dizziness or  lightheadedness or chest pain.  He was unaware of sleep apnea but he does snore.    When I saw him in follow-up on 06/04/2016.  I had extensive discussion with him, both concerning his ST segment changes on ECG during stress testing as well as his need for a permanent pacemaker.  Due to scheduling difficulties and since he remained asymptomatic, ultimately, both of these procedures were done on 06/10/2016.  His diagnostic catheterization was done by Dr. Teressa LowerBensimohn since I was unable to be in the hospital that day.  I reviewed the angiographic findings.  He had normal LV function.  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  There was mild 30 and 40% smooth circumflex narrowings.  Later that same day in the afternoon he underwent successful insertion of a permanent pacemaker by Dr. Royann Shiversroitoru with a dual-chamber Medtronic Axure XT DR pacemaker insertion for his sinus node dysfunction and sinus arrest.  He tolerated both procedures well.  Doing his pacemaker insertion, he has continued to run and has been without dizziness or lightheadedness. He saw Dr. Royann Shiversroitoru on 09/15/2016 for pacemaker follow-up evaluation.  He had normal device function.  He is now scheduled to undergo remote download every 3 months and have yearly office visits with Dr. Royann Shiversroitoru.  and have yearly office visits with Dr. Royann Shiversroitoru.  His blood pressure at that time was elevated his blood pressure at that time was elevated He is now scheduled to undergo remote downloads every 3 months  He had normal device function.  And no longer experiences any dizziness or lightheadedness.  He saw Dr. Royann Shiversroitoru on September 15, 2016 for pacemaker follow-up evaluation.   His blood pressure that day was elevated and his lisinopril was increased to 20 mg and he continued to be on amlodipine.  He feels his blood pressure has been better.  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  In early 2019 had been running approximately 30 miles per week and denied chest pain, PND orthopnea.   I added Zetia to his regimen of atorvastatin.  This is resulted in marked improvement in lipid status and blood work done by his primary physician on March 03, 2017 showed an LDL cholesterol at 31.  He is on lisinopril 20 mg and amlodipine 5 mg for hypertension.  He is on metformin for mild type 2 diabetes mellitus.  Recent hemoglobin A1c was 6.3.  He has had some mild knee discomfort and for this reason has elected not to participate in the nationals and running in June.   When I last saw him in December 2019 he continued to remain asymptomatic.  He still runs but he had fractured his left kneecap which had limit his is activity for some time.  He was unaware of any palpitations.  He denies any chest pressure PND orthopnea.  He underwent repeat laboratory on December 17, 2017 which showed excellent lipid studies with total cholesterol 119, HDL 67, LDL 43, and triglycerides 45.   Since I last saw him he has felt very well.  He now sees Dr. Osborne Casco for his primary care.  He continues to be active and is running 4 to 5 miles per day at least 6 days/week.  He denies any chest tightness or pressure.  He denies any lightheadedness.  He denies PND orthopnea.  He did have a pacemaker remote check on June 2 which revealed stable pacemaker function with stable lead measurements and heart rate histogram.  There apparently was 1 event consistent with a 12 beats of SVT.  The patient remains asymptomatic.  He presents for evaluation.  The patient does not have symptoms concerning for COVID-19 infection (fever, chills, cough, or new shortness of breath).    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   SSS (sick sinus syndrome) ([place]) 06/10/2016   Type II diabetes mellitus ([place])    Past Surgical History:  Procedure Laterality Date   APPENDECTOMY     CARDIAC CATHETERIZATION  06/10/2016   CATARACT EXTRACTION W/ INTRAOCULAR LENS IMPLANT Right    CYSTOSCOPY W/ STONE MANIPULATION     INSERT / REPLACE / REMOVE PACEMAKER  06/10/2016   dual chamber permanent pacemaker   KNEE ARTHROSCOPY W/ MENISCECTOMY Bilateral    LAPAROSCOPIC CHOLECYSTECTOMY     LEFT HEART CATH AND CORONARY ANGIOGRAPHY N/A 06/10/2016   Procedure: Left Heart Cath and Coronary Angiography;  Surgeon: Jolaine Artist, MD;  Location: [facility];  Service: Cardiovascular;  Laterality: N/A;   LITHOTRIPSY     PACEMAKER IMPLANT N/A 06/10/2016   Procedure: Pacemaker Implant;  Surgeon: Sanda Klein, MD;  Location: [facility];  Service: Cardiovascular;  Laterality: N/A;   TONSILLECTOMY       Current Meds  Medication Sig   amLODipine (NORVASC) 5 MG tablet TAKE 1 TABLET BY MOUTH DAILY.   aspirin EC 81 MG tablet Take 81 mg by mouth daily.   atorvastatin (LIPITOR) 40 MG tablet TAKE 1 TABLET ONCE DAILY.   Cholecalciferol (VITAMIN D3) 2000 units capsule Take 2,000 Units by mouth daily.   COMBIGAN 0.2-0.5 % ophthalmic solution Place 1 drop into both eyes 2 (two) times daily.    ezetimibe (ZETIA) 10 MG tablet TAKE 1 TABLET ONCE DAILY.   lisinopril (PRINIVIL,ZESTRIL) 20 MG tablet Take 1 tablet (20 mg total) by mouth daily.   metFORMIN (GLUCOPHAGE) 500 MG tablet Take 500 mg by mouth 2 (two) times daily with a meal.    prednisoLONE acetate (PRED FORTE) 1 % ophthalmic suspension      Allergies:   Patient has no known allergies.   Social History   Tobacco Use   Smoking status: Former Smoker    Packs/day: 0.50    Years: 25.00    Pack years: 12.50    Types:  Cigarettes    Quit date: 1983    Years since quitting: 37.4   Smokeless tobacco: Never Used  Substance Use Topics   Alcohol use: Yes  Comment: 06/10/2016 "might have 2 beers/month"   Drug use: No     Family Hx: The patient's family history includes Colon cancer in his father.  ROS:   Please see the history of present illness.    Negative for fever chills night sweats No cough, change in smell or taste No chest pain or awareness of palpitations No dizziness, presyncope or syncope Positive for diabetes mellitus Positive for hypertension History of left knee discomfort Positive for mild snoring, no daytime sleepiness, sleep is restorative All other systems reviewed and are negative.   Prior CV studies:   The following studies were reviewed today:  06/14/2018  Remote reviewed.   Not pacemaker dependent.  Battery status is good.  Lead measurements are stable.  Heart rate histogram is favorable.  No clinically significant episodes of high ventricular rate or atrial mode switch noted. Rare nonsustained PAT.   Conclusion: Normal remote reviewed.  5 HVR events longest duration 12 beats.  1 event egm consistent with brief SVT  Next remote 91 days  Labs/Other Tests and Data Reviewed:    EKG:  An ECG dated 12/21/2017 was personally reviewed today and demonstrated:  Atrially paced rhythm with PVCs and transient ventricular bigeminy.  Prolonged AV conduction with PR interval 230 ms.  Recent Labs: 12/17/2017: ALT 21; BUN 18; Creatinine, Ser 1.01; Potassium 4.5; Sodium 141   Recent Lipid Panel Lab Results  Component Value Date/Time   CHOL 119 12/17/2017 08:39 AM   TRIG 45 12/17/2017 08:39 AM   HDL 67 12/17/2017 08:39 AM   CHOLHDL 1.8 12/17/2017 08:39 AM   LDLCALC 43 12/17/2017 08:39 AM    Wt Readings from Last 3 Encounters:  06/28/18 165 lb (74.8 kg)  01/18/18 165 lb (74.8 kg)  12/21/17 165 lb 3.2 oz (74.9 kg)     Objective:    Vital Signs:  BP 128/74    Pulse  (!) 59    Ht 5\' 11"  (1.803 m)    Wt 165 lb (74.8 kg)    BMI 23.01 kg/m     On exam he is well-developed and well-nourished and appears younger than his stated age HEENT is unremarkable There is no JVD Breathing is normal and unlabored. There is no audible wheezing Palpation of his heart rate revealed his rhythm to be regular.  Since this was a video visit I could not auscultate his heart There was no tenderness to his chest wall or abdomen. He denied any edema Neurologically he was grossly nonfocal Had a normal affect and normal cognition   ASSESSMENT & PLAN:    1. Sick sinus syndrome: Status post permanent pacemaker insertion  by Dr. 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  His pacemaker is functioning well.  I reviewed his most recent remote recording as interpreted by Dr. Royann Shiversroitoru.  He tells me he has a follow-up appointment with Dr. Sandy Salaamorotto in September 2020. 2. CAD: Cardiac catheterization in May 2018 showed a 70% second diagonal stenosis.  There was mild 30 and 40% smooth circumflex narrowings.  He has not had any anginal symptoms on medical therapy.  He continues to run 4 to 5 miles 6 days/week and remains asymptomatic. 3. Hyperlipidemia: He was started on lipid-lowering therapy and attempt to be very aggressive and induce plaque regression.  Most recent lipid studies in December 2019 show LDL cholesterol at 43, HDL 67, total cholesterol 914119 and triglycerides excellent at 45.  He is tolerating therapy will continue as prescribed. 4. Type 2 diabetes mellitus: He is now followed by Dr. Wylene Simmerisovec for primary care.  Hemoglobin A1c in December 2019 was 7.7.  He will be seeing Dr. Wylene Simmerisovec in several months and will be undergoing complete set of laboratory.   COVID-19 Education: The signs and symptoms of COVID-19 were discussed with the patient and how to seek care for testing (follow up with PCP or  arrange E-visit).  The importance of social distancing was discussed today.  Time:   Today, I have spent 16  minutes with the patient with telehealth technology discussing the above problems.     Medication Adjustments/Labs and Tests Ordered: Current medicines are reviewed at length with the patient today.  Concerns regarding medicines are outlined above.   Tests Ordered: No orders of the defined types were placed in this encounter.   Medication Changes: No orders of the defined types were placed in this encounter.   Follow Up:  6 months  Signed, Nicki Guadalajarahomas Sisto Granillo, MD  06/28/2018 8:04 AM    [facility]

## 2018-06-28 ENCOUNTER — Telehealth (INDEPENDENT_AMBULATORY_CARE_PROVIDER_SITE_OTHER): Payer: Medicare Other | Admitting: Cardiovascular Disease

## 2018-06-28 ENCOUNTER — Encounter: Payer: Self-pay | Admitting: Cardiovascular Disease

## 2018-06-28 VITALS — BP 128/74 | HR 59 | Ht 71.0 in | Wt 165.0 lb

## 2018-06-28 DIAGNOSIS — I2510 Atherosclerotic heart disease of native coronary artery without angina pectoris: Secondary | ICD-10-CM

## 2018-06-28 DIAGNOSIS — I495 Sick sinus syndrome: Secondary | ICD-10-CM | POA: Diagnosis not present

## 2018-06-28 DIAGNOSIS — E785 Hyperlipidemia, unspecified: Secondary | ICD-10-CM

## 2018-06-28 DIAGNOSIS — Z950 Presence of cardiac pacemaker: Secondary | ICD-10-CM

## 2018-06-28 DIAGNOSIS — E119 Type 2 diabetes mellitus without complications: Secondary | ICD-10-CM

## 2018-06-28 NOTE — Patient Instructions (Signed)

## 2018-07-11 ENCOUNTER — Other Ambulatory Visit: Payer: Self-pay | Admitting: Cardiovascular Disease

## 2018-09-06 ENCOUNTER — Other Ambulatory Visit: Payer: Self-pay

## 2018-09-06 ENCOUNTER — Encounter: Payer: Self-pay | Admitting: Podiatry

## 2018-09-06 ENCOUNTER — Ambulatory Visit (INDEPENDENT_AMBULATORY_CARE_PROVIDER_SITE_OTHER): Payer: Medicare Other | Admitting: Podiatry

## 2018-09-06 VITALS — Temp 97.7°F

## 2018-09-06 DIAGNOSIS — M79674 Pain in right toe(s): Secondary | ICD-10-CM

## 2018-09-06 DIAGNOSIS — B351 Tinea unguium: Secondary | ICD-10-CM

## 2018-09-06 DIAGNOSIS — E119 Type 2 diabetes mellitus without complications: Secondary | ICD-10-CM

## 2018-09-06 DIAGNOSIS — L84 Corns and callosities: Secondary | ICD-10-CM

## 2018-09-06 DIAGNOSIS — M79675 Pain in left toe(s): Secondary | ICD-10-CM | POA: Diagnosis not present

## 2018-09-06 NOTE — Patient Instructions (Addendum)
Diabetes Mellitus and Foot Care Foot care is an important part of your health, especially when you have diabetes. Diabetes may cause you to have problems because of poor blood flow (circulation) to your feet and legs, which can cause your skin to:  Become thinner and drier.  Break more easily.  Heal more slowly.  Peel and crack. You may also have nerve damage (neuropathy) in your legs and feet, causing decreased feeling in them. This means that you may not notice minor injuries to your feet that could lead to more serious problems. Noticing and addressing any potential problems early is the best way to prevent future foot problems. How to care for your feet Foot hygiene  Wash your feet daily with warm water and mild soap. Do not use hot water. Then, pat your feet and the areas between your toes until they are completely dry. Do not soak your feet as this can dry your skin.  Trim your toenails straight across. Do not dig under them or around the cuticle. File the edges of your nails with an emery board or nail file.  Apply a moisturizing lotion or petroleum jelly to the skin on your feet and to dry, brittle toenails. Use lotion that does not contain alcohol and is unscented. Do not apply lotion between your toes. Shoes and socks  Wear clean socks or stockings every day. Make sure they are not too tight. Do not wear knee-high stockings since they may decrease blood flow to your legs.  Wear shoes that fit properly and have enough cushioning. Always look in your shoes before you put them on to be sure there are no objects inside.  To break in new shoes, wear them for just a few hours a day. This prevents injuries on your feet. Wounds, scrapes, corns, and calluses  Check your feet daily for blisters, cuts, bruises, sores, and redness. If you cannot see the bottom of your feet, use a mirror or ask someone for help.  Do not cut corns or calluses or try to remove them with medicine.  If you  find a minor scrape, cut, or break in the skin on your feet, keep it and the skin around it clean and dry. You may clean these areas with mild soap and water. Do not clean the area with peroxide, alcohol, or iodine.  If you have a wound, scrape, corn, or callus on your foot, look at it several times a day to make sure it is healing and not infected. Check for: ? Redness, swelling, or pain. ? Fluid or blood. ? Warmth. ? Pus or a bad smell. General instructions  Do not cross your legs. This may decrease blood flow to your feet.  Do not use heating pads or hot water bottles on your feet. They may burn your skin. If you have lost feeling in your feet or legs, you may not know this is happening until it is too late.  Protect your feet from hot and cold by wearing shoes, such as at the beach or on hot pavement.  Schedule a complete foot exam at least once a year (annually) or more often if you have foot problems. If you have foot problems, report any cuts, sores, or bruises to your health care provider immediately. Contact a health care provider if:  You have a medical condition that increases your risk of infection and you have any cuts, sores, or bruises on your feet.  You have an injury that is not   healing.  You have redness on your legs or feet.  You feel burning or tingling in your legs or feet.  You have pain or cramps in your legs and feet.  Your legs or feet are numb.  Your feet always feel cold.  You have pain around a toenail. Get help right away if:  You have a wound, scrape, corn, or callus on your foot and: ? You have pain, swelling, or redness that gets worse. ? You have fluid or blood coming from the wound, scrape, corn, or callus. ? Your wound, scrape, corn, or callus feels warm to the touch. ? You have pus or a bad smell coming from the wound, scrape, corn, or callus. ? You have a fever. ? You have a red line going up your leg. Summary  Check your feet every day  for cuts, sores, red spots, swelling, and blisters.  Moisturize feet and legs daily.  Wear shoes that fit properly and have enough cushioning.  If you have foot problems, report any cuts, sores, or bruises to your health care provider immediately.  Schedule a complete foot exam at least once a year (annually) or more often if you have foot problems. This information is not intended to replace advice given to you by your health care provider. Make sure you discuss any questions you have with your health care provider. Document Released: 12/27/1999 Document Revised: 02/10/2017 Document Reviewed: 01/31/2016 Elsevier Patient Education  2020 [employer]   Onychomycosis/Fungal Toenails  WHAT IS IT? An infection that lies within the keratin of your nail plate that is caused by a fungus.  WHY ME? Fungal infections affect all ages, sexes, races, and creeds.  There may be many factors that predispose you to a fungal infection such as age, coexisting medical conditions such as diabetes, or an autoimmune disease; stress, medications, fatigue, genetics, etc.  Bottom line: fungus thrives in a warm, moist environment and your shoes offer such a location.  IS IT CONTAGIOUS? Theoretically, yes.  You do not want to share shoes, nail clippers or files with someone who has fungal toenails.  Walking around barefoot in the same room or sleeping in the same bed is unlikely to transfer the organism.  It is important to realize, however, that fungus can spread easily from one nail to the next on the same foot.  HOW DO WE TREAT THIS?  There are several ways to treat this condition.  Treatment may depend on many factors such as age, medications, pregnancy, liver and kidney conditions, etc.  It is best to ask your doctor which options are available to you.  1. No treatment.   Unlike many other medical concerns, you can live with this condition.  However for many people this can be a painful condition and may lead to  ingrown toenails or a bacterial infection.  It is recommended that you keep the nails cut short to help reduce the amount of fungal nail. 2. Topical treatment.  These range from herbal remedies to prescription strength nail lacquers.  About 40-50% effective, topicals require twice daily application for approximately 9 to 12 months or until an entirely new nail has grown out.  The most effective topicals are medical grade medications available through physicians offices. 3. Oral antifungal medications.  With an 80-90% cure rate, the most common oral medication requires 3 to 4 months of therapy and stays in your system for a year as the new nail grows out.  Oral antifungal medications do require   blood work to make sure it is a safe drug for you.  A liver function panel will be performed prior to starting the medication and after the first month of treatment.  It is important to have the blood work performed to avoid any harmful side effects.  In general, this medication safe but blood work is required. 4. Laser Therapy.  This treatment is performed by applying a specialized laser to the affected nail plate.  This therapy is noninvasive, fast, and non-painful.  It is not covered by insurance and is therefore, out of pocket.  The results have been very good with a 80-95% cure rate.  The [facility] is the only practice in the area to offer this therapy. 5. Permanent Nail Avulsion.  Removing the entire nail so that a new nail will not grow back. 

## 2018-09-13 ENCOUNTER — Ambulatory Visit (INDEPENDENT_AMBULATORY_CARE_PROVIDER_SITE_OTHER): Payer: Medicare Other | Admitting: *Deleted

## 2018-09-13 DIAGNOSIS — I480 Paroxysmal atrial fibrillation: Secondary | ICD-10-CM

## 2018-09-15 LAB — CUP PACEART REMOTE DEVICE CHECK
Battery Remaining Longevity: 143 mo
Battery Voltage: 3.02 V
Brady Statistic AP VP Percent: 0.02 %
Brady Statistic AP VS Percent: 70.86 %
Brady Statistic AS VP Percent: 0.02 %
Brady Statistic AS VS Percent: 29.1 %
Brady Statistic RA Percent Paced: 71.46 %
Brady Statistic RV Percent Paced: 0.05 %
Date Time Interrogation Session: 20200902164230
Implantable Lead Implant Date: 20180530
Implantable Lead Implant Date: 20180530
Implantable Lead Location: 753859
Implantable Lead Location: 753860
Implantable Lead Model: 5076
Implantable Lead Model: 5076
Implantable Pulse Generator Implant Date: 20180530
Lead Channel Impedance Value: 304 Ohm
Lead Channel Impedance Value: 342 Ohm
Lead Channel Impedance Value: 380 Ohm
Lead Channel Impedance Value: 418 Ohm
Lead Channel Pacing Threshold Amplitude: 0.5 V
Lead Channel Pacing Threshold Amplitude: 0.75 V
Lead Channel Pacing Threshold Pulse Width: 0.4 ms
Lead Channel Pacing Threshold Pulse Width: 0.4 ms
Lead Channel Sensing Intrinsic Amplitude: 16.125 mV
Lead Channel Sensing Intrinsic Amplitude: 16.125 mV
Lead Channel Sensing Intrinsic Amplitude: 3.375 mV
Lead Channel Sensing Intrinsic Amplitude: 3.375 mV
Lead Channel Setting Pacing Amplitude: 1.5 V
Lead Channel Setting Pacing Amplitude: 2 V
Lead Channel Setting Pacing Pulse Width: 0.4 ms
Lead Channel Setting Sensing Sensitivity: 2 mV

## 2018-09-15 NOTE — Progress Notes (Signed)
Subjective: Travis [name] is a 78 y.o. y.o. male who presents today with cc of painful, discolored, thick toenails and calluses which interfere with daily activities. Pain is aggravated when wearing enclosed shoe gear and relieved with periodic professional debridement.  He voices no new pedal problems on today's visit.  His last A1c was 7.7%.   Current Outpatient Medications:  .  amLODipine (NORVASC) 5 MG tablet, TAKE 1 TABLET BY MOUTH DAILY., Disp: 90 tablet, Rfl: 3 .  aspirin EC 81 MG tablet, Take 81 mg by mouth daily., Disp: , Rfl:  .  atorvastatin (LIPITOR) 40 MG tablet, TAKE 1 TABLET ONCE DAILY., Disp: 90 tablet, Rfl: 4 .  Cholecalciferol (VITAMIN D3) 2000 units capsule, Take 2,000 Units by mouth daily., Disp: , Rfl:  .  COMBIGAN 0.2-0.5 % ophthalmic solution, Place 1 drop into both eyes 2 (two) times daily. , Disp: , Rfl:  .  ezetimibe (ZETIA) 10 MG tablet, TAKE 1 TABLET ONCE DAILY., Disp: 90 tablet, Rfl: 1 .  lisinopril (PRINIVIL,ZESTRIL) 20 MG tablet, Take 1 tablet (20 mg total) by mouth daily., Disp: 90 tablet, Rfl: 3 .  metFORMIN (GLUCOPHAGE) 500 MG tablet, Take 500 mg by mouth 2 (two) times daily with a meal. , Disp: , Rfl:  .  prednisoLONE acetate (PRED FORTE) 1 % ophthalmic suspension, , Disp: , Rfl:   No Known Allergies  Objective: Vitals:   09/06/18 1329  Temp: 97.7 F (36.5 C)    Vascular Examination: Capillary refill time <3 seconds x 10 digits.  Dorsalis pedis pulses and Posterior tibial pulses palpable b/l.  Digital hair present x 10 digits.  Skin temperature gradient WNL b/l.  Dermatological Examination: Skin with normal turgor, texture and tone b/l.  Toenails 1-5 b/l discolored, thick, dystrophic with subungual debris and pain with palpation to nailbeds due to thickness of nails.  Hyperkeratotic lesions submet heads 1, 5 b/l with no erythema, no edema, no drainage, no flocculence noted.   Musculoskeletal: Muscle strength 5/5 to all LE muscle  groups  Neurological: Sensation intact 5/5 b/l with 10 gram monofilament.  Assessment: 1. 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  Calluses submet heads 1, 5 b/l 3.  NIDDM  Plan: 1. Continue diabetic foot care principles. Literature dispensed on today. 2. Toenails 1-5 b/l were debrided in length and girth without iatrogenic bleeding. 3. Hyperkeratotic lesion(s) submet heads 1, 5 b/l pared with sterile scalpel blade without incident. 4. Patient to continue soft, supportive shoe gear daily. 5. Patient to report any pedal injuries to medical professional immediately. 6. Follow up 3 months.  7. Patient/POA to call should there be a concern in the interim.

## 2018-09-25 ENCOUNTER — Other Ambulatory Visit: Payer: Self-pay | Admitting: Cardiovascular Disease

## 2018-09-29 NOTE — Progress Notes (Signed)
Remote pacemaker transmission.   

## 2018-10-03 ENCOUNTER — Other Ambulatory Visit: Payer: Self-pay

## 2018-10-03 ENCOUNTER — Ambulatory Visit (INDEPENDENT_AMBULATORY_CARE_PROVIDER_SITE_OTHER): Payer: Medicare Other | Admitting: Cardiovascular Disease

## 2018-10-03 ENCOUNTER — Encounter: Payer: Self-pay | Admitting: Cardiovascular Disease

## 2018-10-03 VITALS — BP 155/85 | HR 76 | Temp 97.2°F | Ht 71.0 in | Wt 160.8 lb

## 2018-10-03 DIAGNOSIS — Z950 Presence of cardiac pacemaker: Secondary | ICD-10-CM | POA: Diagnosis not present

## 2018-10-03 DIAGNOSIS — I350 Nonrheumatic aortic (valve) stenosis: Secondary | ICD-10-CM

## 2018-10-03 DIAGNOSIS — I2510 Atherosclerotic heart disease of native coronary artery without angina pectoris: Secondary | ICD-10-CM

## 2018-10-03 DIAGNOSIS — I495 Sick sinus syndrome: Secondary | ICD-10-CM

## 2018-10-03 DIAGNOSIS — I10 Essential (primary) hypertension: Secondary | ICD-10-CM

## 2018-10-03 NOTE — Progress Notes (Signed)
Cardiology Office Note:    Date:  10/04/2018   ID:  Travis [name] Travis [name], DOB [birth year], MRN [id number]  PCP:  Travis Garbeisovec, Richard W, MD  Cardiologist: Travis [name] M.D.; Travis FairMihai Nancee Brownrigg, MD    Referring MD: Travis Simmerisovec, Adelfa Kohichard W, MD   Chief Complaint  Patient presents with  Follow-up after pacemaker implantation  History of Present Illness:    Travis [name] is a 78 y.o. male with a hx of Hypertension and hyperlipidemia, without known coronary vascular problems to date, severe sinus bradycardia, periods of junctional escape rhythm and sinus pauses of up to 6 seconds in duration on an event monitor.   He underwent elective implantation of a dual-chamber Medtronic Azure XT permanent pacemaker on 06/10/2016. He had coronary angiography the same day which showed 65% mid LAD, 70% second diagonal, 30-40%stenoses in the mid circumflex.  He has mild aortic valve stenosis by an echo performed in May 2020.  He is doing very well.  He continues to exercise every day.  He would like a little more energy, but his pacemaker sensor settings are already fairly aggressive and his heart rate histogram is appropriate.  He last saw Dr. Tresa EndoKelly via telemedicine visit in June 2020.  He continues to be remarkably physically active.    The patient specifically denies any chest pain at rest exertion, dyspnea at rest or with exertion, orthopnea, paroxysmal nocturnal dyspnea, syncope, palpitations, focal neurological deficits, intermittent claudication, lower extremity edema, unexplained weight gain, cough, hemoptysis or wheezing.  He had a very recent remote pacemaker check that showed normal device function.  As before he had a couple of very brief episodes of paroxysmal atrial tachycardia with 1: 1 AV conduction (sustained, but less than 3 minutes) and possibly very brief nonsustained VT.  All of these were asymptomatic.  Pacemaker interrogation shows excellent lead parameters and expected device longevity of 12 years.  Lead  parameters are excellent.  He has 71% atrial pacing and virtually no ventricular pacing.   Past Medical History:  Diagnosis Date  . Heart murmur    "an innocent one" (06/10/2016)  . History of blood transfusion ~ 1956   "while I was in hospital; don't remember why"  . History of kidney stones   . Hyperlipidemia   . Hypertension   . 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 SSS (sick sinus syndrome) (HCC) 06/10/2016  . Type II diabetes mellitus (HCC)     Past Surgical History:  Procedure Laterality Date  . APPENDECTOMY    . CARDIAC CATHETERIZATION  06/10/2016  . CATARACT EXTRACTION [name]/ INTRAOCULAR LENS IMPLANT Right   . CYSTOSCOPY [name]/ STONE MANIPULATION    . INSERT / REPLACE / REMOVE PACEMAKER  06/10/2016   dual chamber permanent pacemaker  . KNEE ARTHROSCOPY [name]/ MENISCECTOMY Bilateral   . LAPAROSCOPIC CHOLECYSTECTOMY    . LEFT HEART CATH AND CORONARY ANGIOGRAPHY N/A 06/10/2016   Procedure: Left Heart Cath and Coronary Angiography;  Surgeon: Dolores PattyBensimhon, Daniel R, MD;  [facility];  Service: Cardiovascular;  Laterality: N/A;  . LITHOTRIPSY    . PACEMAKER IMPLANT N/A 06/10/2016   Procedure: Pacemaker Implant;  Surgeon: Travis Fairroitoru, Tillie Viverette, MD;  Location: [facility] INVASIVE CV LAB;  Service: Cardiovascular;  Laterality: N/A;  . TONSILLECTOMY      Current Medications: Current Meds  Medication Sig  . amLODipine (NORVASC) 5 MG tablet TAKE 1 TABLET BY MOUTH DAILY.  Marland Kitchen. aspirin EC 81 MG tablet Take 81 mg by mouth daily.  Marland Kitchen. atorvastatin (LIPITOR) 40  MG tablet TAKE 1 TABLET ONCE DAILY.  Marland Kitchen. Cholecalciferol (VITAMIN D3) 2000 units capsule Take 2,000 Units by mouth daily.  . COMBIGAN 0.2-0.5 % ophthalmic solution Place 1 drop into both eyes 2 (two) times daily.   Marland Kitchen. ezetimibe (ZETIA) 10 MG tablet TAKE 1 TABLET ONCE DAILY.  Marland Kitchen. lisinopril (PRINIVIL,ZESTRIL) 20 MG tablet Take 1 tablet (20 mg total) by mouth daily.  . metFORMIN (GLUCOPHAGE) 500 MG tablet Take 500 mg by mouth 2 (two)  times daily with a meal.   . prednisoLONE acetate (PRED FORTE) 1 % ophthalmic suspension      Allergies:   Patient has no known allergies.   Social History   Socioeconomic History  . Marital status: Married    Spouse name: Not on file  . Number of children: Not on file  . Years of education: Not on file  . Highest education level: Not on file  Occupational History  . Not on file  Social Needs  . Financial resource strain: Not on file  . Food insecurity    Worry: Not on file    Inability: Not on file  . Transportation needs    Medical: Not on file    Non-medical: Not on file  Tobacco Use  . Smoking status: Former Smoker    Packs/day: 0.50    Years: 25.00    Pack years: 12.50    Types: Cigarettes    Quit date: 1983    Years since quitting: 37.7  . Smokeless tobacco: Never Used  Substance and Sexual Activity  . Alcohol use: Yes    Comment: 06/10/2016 "might have 2 beers/month"  . Drug use: No  . Sexual activity: Not on file  Lifestyle  . Physical activity    Days per week: Not on file    Minutes per session: Not on file  . Stress: Not on file  Relationships  . Social Musicianconnections    Talks on phone: Not on file    Gets together: Not on file    Attends religious service: Not on file    Active member of club or organization: Not on file    Attends meetings of clubs or organizations: Not on file    Relationship status: Not on file  Other Topics Concern  . Not on file  Social History Narrative  . Not on file     Family History: The patient's family history includes Colon cancer in his father. ROS:   Please see the history of present illness.    All other systems reviewed and are negative.  EKGs/Labs/Other Studies Reviewed:    The following studies were reviewed today: Full pacemaker check  May 24, 2018 echocardiogram  1. The left ventricle has normal systolic function, with an ejection fraction of 55-60%. The cavity size was normal. Left ventricular  diastolic parameters were normal.  2. The right ventricle has normal systolic function. The cavity was mildly enlarged. There is no increase in right ventricular wall thickness. Right ventricular systolic pressure is mildly elevated with an estimated pressure of 32.9 mmHg.  3. Left atrial size was mildly dilated.  4. Right atrial size was moderately dilated.  5. The aortic valve is tricuspid. Mild thickening of the aortic valve. Mild calcification of the aortic valve. Mild stenosis of the aortic valve.  6. The aortic root is normal in size and structure.  7. The inferior vena cava was normal in size with <50% respiratory variability.  EKG:  EKG is ordered today.  Shows atrial  paced ventricular sensed rhythm and isolated mildly inverted T waves in lead III and aVF.  Recent Labs: 12/17/2017: ALT 21; BUN 18; Creatinine, Ser 1.01; Potassium 4.5; Sodium 141  Normal TSH 3.50  Physical Exam:    VS:  BP (!) 155/85   Pulse 76   Temp (!) 97.2 Travis (36.2 C)   Ht 5\' 11"  (1.803 m)   Wt 160 lb 12.8 oz (72.9 kg)   SpO2 100%   BMI 22.43 kg/m     Wt Readings from Last 3 Encounters:  10/03/18 160 lb 12.8 oz (72.9 kg)  06/28/18 165 lb (74.8 kg)  01/18/18 165 lb (74.8 kg)      General: Alert, oriented x3, no distress, healthy subclavian pacemaker site Head: no evidence of trauma, PERRL, EOMI, no exophtalmos or lid lag, no myxedema, no xanthelasma; normal ears, nose and oropharynx Neck: normal jugular venous pulsations and no hepatojugular reflux; brisk carotid pulses without delay and no carotid bruits Chest: clear to auscultation, no signs of consolidation by percussion or palpation, normal fremitus, symmetrical and full respiratory excursions Cardiovascular: normal position and quality of the apical impulse, regular rhythm, normal first and second heart sounds, 2/6 aortic ejection murmur is early peaking no diastolic murmurs, rubs or gallops Abdomen: no tenderness or distention, no masses by  palpation, no abnormal pulsatility or arterial bruits, normal bowel sounds, no hepatosplenomegaly Extremities: no clubbing, cyanosis or edema; 2+ radial, ulnar and brachial pulses bilaterally; 2+ right femoral, posterior tibial and dorsalis pedis pulses; 2+ left femoral, posterior tibial and dorsalis pedis pulses; no subclavian or femoral bruits Neurological: grossly nonfocal Psych: Normal mood and affect    ASSESSMENT:    1. SSS (sick sinus syndrome) ([place])   2. Pacemaker   3. Coronary artery disease involving native coronary artery of native heart without angina pectoris   4. Aortic valve stenosis, nonrheumatic   5. Essential hypertension      PLAN:    In order of problems listed above:  1. SSS: Effective treatment of bradycardia and chronotropic competence with her current pacemaker settings. 2. Pacemaker: Normal device function, continue remote downloads every 3 months. 3. CAD:  he is very physically active and asymptomatic from this point of view. 4. AS: Mild and asymptomatic 5. HTN: Well-controlled.  Today's blood pressure recording is quite unusual for him.  Typically at home his blood pressures in the 120s/70s    Medication Adjustments/Labs and Tests Ordered: Current medicines are reviewed at length with the patient today.  Concerns regarding medicines are outlined above. Labs and tests ordered and medication changes are outlined in the patient instructions below:  Patient Instructions  Medication Instructions:  Your physician recommends that you continue on your current medications as directed. Please refer to the Current Medication list given to you today.  If you need a refill on your cardiac medications before your next appointment, please call your pharmacy.    Follow-Up: At [facility], you and your health needs are our priority.  As part of our continuing mission to provide you with exceptional heart care, we have created designated Provider Care Teams.  These  Care Teams include your primary Cardiologist (physician) and Advanced Practice Providers (APPs -  Physician Assistants and Nurse Practitioners) who all work together to provide you with the care you need, when you need it. . You will need a follow up appointment in 12 months.  Please call our office 2 months in advance to schedule this appointment.   Any Other Special Instructions  Will Be Listed Below (If Applicable).       Signed, Travis Fair, MD  10/04/2018 6:48 PM    [facility]

## 2018-10-03 NOTE — Patient Instructions (Addendum)
Medication Instructions:  Your physician recommends that you continue on your current medications as directed. Please refer to the Current Medication list given to you today.  If you need a refill on your cardiac medications before your next appointment, please call your pharmacy.    Follow-Up: At [facility], you and your health needs are our priority.  As part of our continuing mission to provide you with exceptional heart care, we have created designated Provider Care Teams.  These Care Teams include your primary Cardiologist (physician) and Advanced Practice Providers (APPs -  Physician Assistants and Nurse Practitioners) who all work together to provide you with the care you need, when you need it. . You will need a follow up appointment in 12 months.  Please call our office 2 months in advance to schedule this appointment.   Any Other Special Instructions Will Be Listed Below (If Applicable).

## 2018-10-06 ENCOUNTER — Encounter: Payer: Self-pay | Admitting: Cardiovascular Disease

## 2018-10-06 DIAGNOSIS — Z0000 Encounter for general adult medical examination without abnormal findings: Secondary | ICD-10-CM | POA: Insufficient documentation

## 2018-10-13 DIAGNOSIS — R809 Proteinuria, unspecified: Secondary | ICD-10-CM | POA: Insufficient documentation

## 2018-10-13 DIAGNOSIS — E1121 Type 2 diabetes mellitus with diabetic nephropathy: Secondary | ICD-10-CM | POA: Insufficient documentation

## 2018-12-06 ENCOUNTER — Other Ambulatory Visit: Payer: Self-pay

## 2018-12-06 ENCOUNTER — Ambulatory Visit (INDEPENDENT_AMBULATORY_CARE_PROVIDER_SITE_OTHER): Payer: Medicare Other | Admitting: Podiatry

## 2018-12-06 ENCOUNTER — Encounter: Payer: Self-pay | Admitting: Podiatry

## 2018-12-06 DIAGNOSIS — E119 Type 2 diabetes mellitus without complications: Secondary | ICD-10-CM

## 2018-12-06 DIAGNOSIS — L84 Corns and callosities: Secondary | ICD-10-CM | POA: Diagnosis not present

## 2018-12-06 DIAGNOSIS — B351 Tinea unguium: Secondary | ICD-10-CM

## 2018-12-06 DIAGNOSIS — M79675 Pain in left toe(s): Secondary | ICD-10-CM | POA: Diagnosis not present

## 2018-12-06 DIAGNOSIS — M79674 Pain in right toe(s): Secondary | ICD-10-CM | POA: Diagnosis not present

## 2018-12-10 NOTE — Progress Notes (Signed)
Subjective:  Travis [name] presents to clinic today for preventative diabetic foot care  With cc calluses b/l feet and painful, thick, discolored, elongated toenails  of both feet that become tender and patient cannot cut because of thickness. Pain is aggravated when wearing enclosed shoe gear and relieved with periodic professional debridement.  Patient voices no new pedal concerns on today's visit.  Medications reviewed in chart.  No Known Allergies   Objective:  Physical Examination:  Vascular Examination: Capillary refill time <3 seconds b/l.   Palpable DP/PT pulses b/l.  Digital hair present b/l.   No edema noted b/l.  Skin temperature gradient WNL b/l.  Dermatological Examination: Skin with normal turgor, texture and tone b/l.  No open wounds b/l.  No interdigital macerations noted b/l.  Elongated, thick, discolored brittle toenails with subungual debris and pain on dorsal palpation of nailbeds 1-5 b/l.  Hyperkeratotic lesion submet head 1, 5 b/l with tenderness to palpation. No edema, no erythema, no drainage, no flocculence.  Musculoskeletal Examination: Muscle strength 5/5 to all muscle groups b/l.  No pain, crepitus or joint discomfort with active/passive ROM.  Neurological Examination: Sensation intact 5/5 b/l with 10 gram monofilament.  Assessment: Mycotic nail infection with pain 1-5 b/l Calluses submet head 1, 5 b/l  NIDDM  Plan: 1. Toenails 1-5 b/l were debrided in length and girth. Iatrogenic laceration sustained during debridement of left hallux. Treated with Lumicain Hemostatic Solution and alcohol. Light dressing applied. Patient instructed to apply TAO to left great toe once daily for 3 days.  2. Calluses pared submetatarsal heads 1, 5 b/l utilizing sterile scalpel blade without incident. 3. Continue soft, supportive shoe gear daily. 4. Report any pedal injuries to medical professional. 5. Follow up 3 months. 6. Patient/POA to call should  there be a question/concern in there interim.

## 2018-12-13 ENCOUNTER — Ambulatory Visit (INDEPENDENT_AMBULATORY_CARE_PROVIDER_SITE_OTHER): Payer: Medicare Other | Admitting: *Deleted

## 2018-12-13 DIAGNOSIS — Z950 Presence of cardiac pacemaker: Secondary | ICD-10-CM

## 2018-12-13 LAB — CUP PACEART REMOTE DEVICE CHECK
Battery Remaining Longevity: 140 mo
Battery Voltage: 3.02 V
Brady Statistic AP VP Percent: 0.05 %
Brady Statistic AP VS Percent: 48.66 %
Brady Statistic AS VP Percent: 0.03 %
Brady Statistic AS VS Percent: 51.26 %
Brady Statistic RA Percent Paced: 48.87 %
Brady Statistic RV Percent Paced: 0.08 %
Date Time Interrogation Session: 20201201010457
Implantable Lead Implant Date: 20180530
Implantable Lead Implant Date: 20180530
Implantable Lead Location: 753859
Implantable Lead Location: 753860
Implantable Lead Model: 5076
Implantable Lead Model: 5076
Implantable Pulse Generator Implant Date: 20180530
Lead Channel Impedance Value: 285 Ohm
Lead Channel Impedance Value: 342 Ohm
Lead Channel Impedance Value: 361 Ohm
Lead Channel Impedance Value: 418 Ohm
Lead Channel Pacing Threshold Amplitude: 0.5 V
Lead Channel Pacing Threshold Amplitude: 0.75 V
Lead Channel Pacing Threshold Pulse Width: 0.4 ms
Lead Channel Pacing Threshold Pulse Width: 0.4 ms
Lead Channel Sensing Intrinsic Amplitude: 15.375 mV
Lead Channel Sensing Intrinsic Amplitude: 15.375 mV
Lead Channel Sensing Intrinsic Amplitude: 3.75 mV
Lead Channel Sensing Intrinsic Amplitude: 3.75 mV
Lead Channel Setting Pacing Amplitude: 1.5 V
Lead Channel Setting Pacing Amplitude: 2 V
Lead Channel Setting Pacing Pulse Width: 0.4 ms
Lead Channel Setting Sensing Sensitivity: 2 mV

## 2018-12-21 ENCOUNTER — Other Ambulatory Visit: Payer: Self-pay | Admitting: Cardiovascular Disease

## 2018-12-26 ENCOUNTER — Other Ambulatory Visit: Payer: Self-pay

## 2018-12-26 ENCOUNTER — Ambulatory Visit (INDEPENDENT_AMBULATORY_CARE_PROVIDER_SITE_OTHER): Payer: Medicare Other | Admitting: Cardiovascular Disease

## 2018-12-26 DIAGNOSIS — E119 Type 2 diabetes mellitus without complications: Secondary | ICD-10-CM

## 2018-12-26 DIAGNOSIS — I495 Sick sinus syndrome: Secondary | ICD-10-CM

## 2018-12-26 DIAGNOSIS — Z950 Presence of cardiac pacemaker: Secondary | ICD-10-CM

## 2018-12-26 DIAGNOSIS — I358 Other nonrheumatic aortic valve disorders: Secondary | ICD-10-CM

## 2018-12-26 DIAGNOSIS — I2510 Atherosclerotic heart disease of native coronary artery without angina pectoris: Secondary | ICD-10-CM | POA: Diagnosis not present

## 2018-12-26 DIAGNOSIS — E785 Hyperlipidemia, unspecified: Secondary | ICD-10-CM | POA: Diagnosis not present

## 2018-12-26 DIAGNOSIS — R012 Other cardiac sounds: Secondary | ICD-10-CM

## 2018-12-26 MED ORDER — AMLODIPINE BESYLATE 5 MG PO TABS: 7.5000 mg | ORAL_TABLET | Freq: Every day | ORAL | 3 refills | Status: DC

## 2018-12-26 NOTE — Progress Notes (Signed)
Cardiology Office Note    Date:  12/28/2018   ID:  Travis, [name] [birth year], MRN [id number]  PCP:  Haywood Pao, MD  Referring M.D.: Dr. Leeroy Cha  Cardiologist:  Shelva Majestic, MD     History of Present Illness:  Travis [name] is a 78 y.o. male who was initially referred through the courtesy of [place] for evaluation of lightheadedness and dizziness while running.  I last saw him in May 2019.  He presents for 47-monthfollow-up evaluation.  Mr. SSkodahas been active for his entire life.  He has a history of hypertension, hyperlipidemia, and has been diagnosed with type 2 diabetes mellitus for 14 years.  He exercises at least 6 days per week for 1-2 hours.  He does resistance training, spin classes, and typically runs anywhere from 5-6 miles at a 9-10 minute mile pace and has been doing so for many years.  His pulse typically runs in the low 30s.  Recently, he has begun to notice that he becomes dizzy when he starts to run.  This is associated with mild shortness of breath.  He denies any chest pain.  The symptoms have occurred since January.  He denies any frank syncope.  He denies the lightheaded symptoms when he is at rest and his symptoms only occur with significant activity.  When he does spin class he is unaware of any  symptoms.    When I saw for initial evaluation, his ECG revealed marked sinus bradycardia with ventricular rate averaging 37 bpm.  PR interval was 190 ms and QTc interval was 335 ms.  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  He had a 2/6 systolic murmur on physical exam, suggestive of possible aortic valve disease.  I scheduled him for a 2-D echo Doppler study which was done on 06/02/2016.  This showed an ejection fraction at 55-65%.  There was mild LVH.  He did not have any regional wall motion abnormalities.  His aortic valve was reported as structurally normal.  There was a  mean gradient of 9 and a peak gradient of 17.  There was mild MR and mild LA dilation.  His right atrium was mild moderately dilated.  I obtain a magnesium level, which was normal.  I scheduled him for an exercise nuclear stress test to assess both chronotropic competence to exercise as well as myocardial perfusion.  On his exercise portion.  His resting heart rate was 34 bpm, which rose to a maximum of 126/m.  He completed 2 minutes and 2 seconds into stage III of Bruce protocol.  He was able to augment his heart rate appropriately with exercise.  At the end of stage I reflective of good aerobic conditioning ventricular rate was 71, at the end of stage II 100, and in stage III maximum heart rate was 125.  He was entirely asymptomatic but had an exaggerated blood pressure response opted to 16/75 and at peak stress developed asymptomatic ST segment depression up to 3 mm in the inferior and inferolateral leads.  There was a very small mild distal anteroseptal defect which most likely was artifact.  There was no ischemia identified.  He has been wearing an event monitor.  We werereceivingdaily notifications demonstrating marked bradycardia with heart rates in the 30s.  These have been sinus rhythm.  He has been entirely asymptomatic.  He had developed sinus pauses up to 5 - 6 seconds.  He remained entirely asymptomatic and specifically denied any dizziness or lightheadedness or chest pain.  He was unaware of sleep apnea but he does snore.    At  follow-up on 06/04/2016  I had extensive discussion with him, both concerning his ST segment changes on ECG during stress testing as well as his need for a permanent pacemaker.  Due to scheduling difficulties and since he remained asymptomatic, ultimately, both of these procedures were done on 06/10/2016.  Diagnostic catheterization revealed normal LV function.  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  There was  mild 30 and 40% smooth circumflex narrowings.  Later that same day in the afternoon he underwent successful insertion of a permanent pacemaker by Dr. 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  He tolerated both procedures well.  Following his pacemaker insertion, he has continued to run and has been without dizziness or lightheadedness. He saw Dr. Sallyanne Kuster on 09/15/2016 for pacemaker follow-up evaluation.  He had normal device function.  He is now scheduled to undergo remote download every 3 months and have yearly office visits with Dr. Sallyanne Kuster. His blood pressure at that time was elevated his blood pressure at that time was elevated He is now scheduled to undergo remote downloads every 3 months  He had normal device function.  And no longer experiences any dizziness or lightheadedness.  He saw Dr. Sallyanne Kuster on September 15, 2016 for pacemaker follow-up evaluation.   His blood pressure that day was elevated and his lisinopril was increased to 20 mg and he continued to be on amlodipine.  He feels his blood pressure has been better.  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  In early 2018 had been running approximately 30 miles per week and denied chest pain, PND orthopnea.   I added Zetia to his regimen of atorvastatin.  This is resulted in marked improvement in lipid status and blood work done by his primary physician on March 03, 2017 showed an LDL cholesterol at 31.  He is on lisinopril 20 mg and amlodipine 5 mg for hypertension.  He is on metformin for mild type 2 diabetes mellitus.  Recent hemoglobin A1c was 6.3.  He has had some mild knee discomfort and for this reason has elected not to participate in the nationals and running in June.   When I saw him in December 2019 he continued to remain asymptomatic. He still  runs but he had fractured his left kneecap which had limit his is activity for some time. He was unaware of any palpitations. He denies any chest pressure PND orthopnea. He underwent repeat laboratory on December 17, 2017 which showed excellent lipid studies with total cholesterol 119, HDL 67, LDL 43, and triglycerides 45.   I last evaluated him in a telemedicine visit on June 28, 2018 and he continued to feel well. He now sees Dr. Osborne Casco for his primary care.  He continues to be active and is running 4 to 5 miles per day at least 6 days/week.  He denies any chest tightness or pressure.  He denies any lightheadedness.  He denies PND orthopnea.  A pacemaker remote check on June 2 which revealed stable pacemaker function with stable lead measurements and heart rate histogram.  There apparently was 1  event consistent with a 12 beats of SVT.  The patient remains asymptomatic.   Since I last evaluated him, he saw Dr. Sallyanne Kuster in September 2020.  Presently he remains asymptomatic.  He is running 6 days/week for at least 5 miles a day at a 10 to 11 mile pace.  Blood pressure at home has been slightly elevated in the 027O systolically.  He denies palpitations.  He denies shortness of breath.  He denies leg swelling.  He presents for evaluation.   Past Medical History:  Diagnosis Date  . Heart murmur    "an innocent one" (06/10/2016)  . History of blood transfusion ~ 1956   "while I was in hospital; don't remember why"  . History of kidney stones   . Hyperlipidemia   . Hypertension   . 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 SSS (sick sinus syndrome) ([place]) 06/10/2016  . Type II diabetes mellitus ([place])     Past Surgical History:  Procedure Laterality Date  . APPENDECTOMY    . CARDIAC CATHETERIZATION  06/10/2016  . CATARACT EXTRACTION W/ INTRAOCULAR LENS IMPLANT Right   . CYSTOSCOPY W/ STONE MANIPULATION    . INSERT / REPLACE / REMOVE PACEMAKER  06/10/2016   dual chamber permanent  pacemaker  . KNEE ARTHROSCOPY W/ MENISCECTOMY Bilateral   . LAPAROSCOPIC CHOLECYSTECTOMY    . LEFT HEART CATH AND CORONARY ANGIOGRAPHY N/A 06/10/2016   Procedure: Left Heart Cath and Coronary Angiography;  Surgeon: Jolaine Artist, MD;  Location: [facility];  Service: Cardiovascular;  Laterality: N/A;  . LITHOTRIPSY    . PACEMAKER IMPLANT N/A 06/10/2016   Procedure: Pacemaker Implant;  Surgeon: Sanda Klein, MD;  Location: [facility];  Service: Cardiovascular;  Laterality: N/A;  . TONSILLECTOMY      Current Medications: Outpatient Medications Prior to Visit  Medication Sig Dispense Refill  . aspirin EC 81 MG tablet Take 81 mg by mouth daily.    . Cholecalciferol (VITAMIN D3) 2000 units capsule Take 2,000 Units by mouth daily.    . COMBIGAN 0.2-0.5 % ophthalmic solution Place 1 drop into both eyes 2 (two) times daily.     Marland Kitchen lisinopril (ZESTRIL) 20 MG tablet TAKE 1 TABLET ONCE DAILY. 90 tablet 4  . metFORMIN (GLUCOPHAGE) 500 MG tablet Take 500 mg by mouth 2 (two) times daily with a meal.     . amLODipine (NORVASC) 5 MG tablet TAKE 1 TABLET BY MOUTH DAILY. 90 tablet 3  . atorvastatin (LIPITOR) 40 MG tablet TAKE 1 TABLET ONCE DAILY. 90 tablet 0  . ezetimibe (ZETIA) 10 MG tablet TAKE 1 TABLET ONCE DAILY. 90 tablet 1  . prednisoLONE acetate (PRED FORTE) 1 % ophthalmic suspension      No facility-administered medications prior to visit.     Allergies:   Patient has no known allergies.   Social History   Socioeconomic History  . Marital status: Married    Spouse name: Not on file  . Number of children: Not on file  . Years of education: Not on file  . Highest education level: Not on file  Occupational History  . Not on file  Tobacco Use  . Smoking status: Former Smoker    Packs/day: 0.50    Years: 25.00    Pack years: 12.50    Types: Cigarettes    Quit date: 1983    Years since quitting: 37.9  . Smokeless tobacco: Never Used  Substance and Sexual Activity    . Alcohol  use: Yes    Comment: 06/10/2016 "might have 2 beers/month"  . Drug use: No  . Sexual activity: Not on file  Other Topics Concern  . Not on file  Social History Narrative  . Not on file   Social Determinants of Health   Financial Resource Strain:   . Difficulty of Paying Living Expenses: Not on file  Food Insecurity:   . Worried About Charity fundraiser in the Last Year: Not on file  . Ran Out of Food in the Last Year: Not on file  Transportation Needs:   . Lack of Transportation (Medical): Not on file  . Lack of Transportation (Non-Medical): Not on file  Physical Activity:   . Days of Exercise per Week: Not on file  . Minutes of Exercise per Session: Not on file  Stress:   . Feeling of Stress : Not on file  Social Connections:   . Frequency of Communication with Friends and Family: Not on file  . Frequency of Social Gatherings with Friends and Family: Not on file  . Attends Religious Services: Not on file  . Active Member of Clubs or Organizations: Not on file  . Attends Archivist Meetings: Not on file  . Marital Status: Not on file    Social history is notable in that he was born in [place].  He's been married for 54 years.  He has 2 children, 7 grandchildren, and one great-grandchild.  He has a Scientist, water quality in Theatre stage manager.  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  He is retired.  He drinks occasional beer.  He does not smoke.   Family History:  The patient's family history includes Colon cancer in his father..  He has a brother age 21 and a sister age 26, who are healthy.   ROS General: Negative; No fevers, chills, or night sweats;  HEENT: Negative; No changes in vision or hearing, sinus congestion, difficulty swallowing Pulmonary: Negative; No cough, wheezing, shortness of breath, hemoptysis Cardiovascular:  See HPI GI: Negative; No nausea, vomiting, diarrhea, or abdominal pain GU: Negative; No dysuria,  hematuria, or difficulty voiding Musculoskeletal: Left knee discomfort Hematologic/Oncology: Negative; no easy bruising, bleeding Endocrine: Negative; no heat/cold intolerance; no diabetes Neuro: Negative; no changes in balance, headaches Skin: Negative; No rashes or skin lesions Psychiatric: Negative; No behavioral problems, depression Sleep: Positive for snoring, no daytime sleepiness, hypersomnolence, bruxism, restless legs, hypnogognic hallucinations, no cataplexy Other comprehensive 14 point system review is negative.   PHYSICAL EXAM:   VS:  BP (!) 148/90   Pulse 69   Ht '5\' 11"'  (1.803 m)   Wt 164 lb (74.4 kg)   SpO2 99%   BMI 22.87 kg/m     Repeat blood pressure by me was further increased at 168/88  Wt Readings from Last 3 Encounters:  12/26/18 164 lb (74.4 kg)  10/03/18 160 lb 12.8 oz (72.9 kg)  06/28/18 165 lb (74.8 kg)    General: Alert, oriented, no distress.  Skin: normal turgor, no rashes, warm and dry HEENT: Normocephalic, atraumatic. Pupils equal round and reactive to light; sclera anicteric; extraocular muscles intact;  Nose without nasal septal hypertrophy Mouth/Parynx benign; Mallinpatti scale 2 Neck: No JVD, no carotid bruits; normal carotid upstroke Lungs: clear to ausculatation and percussion; no wheezing or rales Chest wall: without tenderness to palpitation Heart: PMI not displaced, RRR, s1 s2 normal, 1/6 systolic murmur, no diastolic murmur; in the sitting position, there is a squeaking  high-pitched sound more prominently me leaning forward and which seems to resolve with supine posture.  When going back up to the supine position, the squeaky possible pericardial rub sound was again prominent;  No gallops, thrills, or heaves Abdomen: soft, nontender; no hepatosplenomehaly, BS+; abdominal aorta nontender and not dilated by palpation. Back: no CVA tenderness Pulses 2+ Musculoskeletal: full range of motion, normal strength, no joint deformities Extremities:  no clubbing cyanosis or edema, Homan's sign negative  Neurologic: grossly nonfocal; Cranial nerves grossly wnl Psychologic: Normal mood and affect   Studies/Labs Reviewed:   ECG (independently read by me): Atrially paced rhythm at 69 bpm with prolonged AV conduction, PR interval 222 ms.  LVH with repolarization changes.  December 2019 ECG (independently read by me): Atrially paced rhythm with PVCs and transient ventricular bigeminy.  Prolonged AV conduction with PR interval 230 ms.  May 2019 ECG (independently read by me): Atrial paced rhythm at 51 bpm.  Prolonged AV conduction with a PR interval of 242 ms.  QTc interval 383 ms.  November 24, 2016 EKG:  EKG is ordered today.  A paced rhythm at 51 bpm.  PR interval 222 ms.  Mild LVH.  QTc interval 372 ms  06/16/2016 ECG (independently read by me): Atrially paced rhythm at 50 bpm with prolonged AV conduction with a PR interval at 214 ms.  06/04/2016 ECG (independently read by me): Marked sinus bradycardia with possible low atrial involvement.  PR interval 166 ms, QTc interval 335 ms.  Ventricular rate 36.  05/20/2016 ECG (independently read by me): Marked sinus bradycardia with sinus arrhythmia, ventricular rate in the 40s to upper 30s, average 37.  PR interval 190 ms.  QTc interval 335 ms.  No ST segment changes.  Recent Labs: BMP Latest Ref Rng & Units 12/17/2017 03/03/2017 11/27/2016  Glucose 65 - 99 mg/dL 135(H) 119(H) 108(H)  BUN 8 - 27 mg/dL '18 22 23  ' Creatinine 0.76 - 1.27 mg/dL 1.01 1.13 1.12  BUN/Creat Ratio 10 - '24 18 19 21  ' Sodium 134 - 144 mmol/L 141 140 138  Potassium 3.5 - 5.2 mmol/L 4.5 4.7 4.2  Chloride 96 - 106 mmol/L 102 106 98  CO2 20 - 29 mmol/L '23 23 26  ' Calcium 8.6 - 10.2 mg/dL 10.2 10.2 10.2     Hepatic Function Latest Ref Rng & Units 12/17/2017 03/03/2017 11/17/2016  Total Protein 6.0 - 8.5 g/dL 6.5 6.4 7.1  Albumin 3.5 - 4.8 g/dL 4.6 4.5 4.8  AST 0 - 40 IU/L '17 19 20  ' ALT 0 - 44 IU/L '21 21 19  ' Alk Phosphatase  39 - 117 IU/L 77 62 82  Total Bilirubin 0.0 - 1.2 mg/dL 1.1 1.2 1.3(H)    CBC Latest Ref Rng & Units 06/04/2016  WBC 3.4 - 10.8 x10E3/uL 7.1  Hemoglobin 13.0 - 17.7 g/dL 14.2  Hematocrit 37.5 - 51.0 % 41.9  Platelets 150 - 379 x10E3/uL 260   Lab Results  Component Value Date   MCV 96 06/04/2016   No results found for: TSH Lab Results  Component Value Date   HGBA1C 7.7 (H) 12/17/2017     BNP No results found for: BNP  ProBNP No results found for: PROBNP   Lipid Panel     Component Value Date/Time   CHOL 119 12/17/2017 0839   TRIG 45 12/17/2017 0839   HDL 67 12/17/2017 0839   CHOLHDL 1.8 12/17/2017 0839   LDLCALC 43 12/17/2017 0839     RADIOLOGY: CUP PACEART REMOTE DEVICE  CHECK  Result Date: 12/13/2018 Scheduled remote reviewed.  Normal device function.   7 HVR episodes. One appears to be SVT, others appear NSVT, longest 22 beats. Will forward for review. Next remote 91 days.  AM[place]    Additional studies/ records that were reviewed today include:  I reviewed the recent records from [facility]. I have reviewed his 2-D echo Doppler study, exercise Myoview study, laboratory, as well as daily  Monitors with critical notification from the event monitor prior to the insertion of the pacemaker.  I personally reviewed the catheterization findings, as well as his pacemaker implantation report.  Subsequent office visits with Dr. Sallyanne Kuster have been reviewed as well as recent laboratory from October 06, 2018.   ASSESSMENT:    1. Coronary artery disease involving native coronary artery of native heart without angina pectoris   2. Hyperlipidemia with target LDL less than 70   3. SSS (sick sinus syndrome) ([place])   4. Pacemaker   5. Pericardial friction rub   6. Type 2 diabetes mellitus without complication, without long-term current use of insulin (HCC)   7. Aortic valve sclerosis     PLAN:  Mr. Neno Hohensee is a very pleasant, young appearing active  78 year old  gentleman who has a long-standing history of significant exercise and has been exercising for at least 1-2 hours per day 6 days per week.  When I saw him he was running 5 to 6 miles at a time and was doing spin class.  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  Prior to his pacemaker cardiac catheterization revealed a 60 to 70% calcified LAD stenosis with normal LV function.  Subsequently, he has continued to be asymptomatic.  We have been very aggressive with lipid-lowering therapy and LDL cholesterol have been running in the 40s.  His most recent laboratory from October 06, 2018 shows a total cholesterol of 103, HDL 52, LDL 42, and triglycerides 46 on his regimen consisting of atorvastatin 40 mg and Zetia 10 mg.  He has a history of hypertension and has been on amlodipine 5 mg in addition to lisinopril 20 mg.  His blood pressure is elevated and at home has been running in the 580D systolically.  On repeat by me today he has had increased to 168/88.  I have recommended further titration of amlodipine to 7.5 mg daily.  If after 1 to 2 weeks of therapy if his resting blood pressure still is consistently in the 140s or above I have recommended further titration to 10 milligrams.  On exam he does have a squeaking possible pericardial friction rub which is heard only in the sitting and anterior body position and resolves with lying flat.  I am recommending an Echo Doppler study for further evaluation of systolic and diastolic function, valvular architecture as well as pericardium.  He is diabetic on Metformin.  He will be seeing Dr. Osborne Casco in March with follow-up laboratory.  I will see him in 4 months for reevaluation  Medication Adjustments/Labs and Tests Ordered: Current medicines are reviewed at length with the patient today.  Concerns regarding medicines are outlined above.  Medication changes,  Labs and Tests ordered today are listed in the Patient Instructions below. Patient Instructions  Medication Instructions:  INCREASE AMLODIPINE 7.5MG (1-1/2 TAB) DAILY If you need a refill on your cardiac medications before your next appointment, please call your pharmacy.   Testing/Procedures: Echocardiogram -  Your physician has requested that you have an echocardiogram. Echocardiography is a painless test that uses sound waves to create images of your heart. It provides your doctor with information about the size and shape of your heart and how well your heart's chambers and valves are working. This procedure takes approximately one hour. There are no restrictions for this procedure. This will be performed at our [facility] location - [address].  Special Instructions: TAKE AND LOG BLOOD PRESSURE DAILY 2 WEEKS IF > 145 INCREASE AMLODIPINE AND CALL AND LET us KNOW  Follow-Up: IN 4 months   In Person Shelva Majestic, MD.    At [facility], you and your health needs are our priority.  As part of our continuing mission to provide you with exceptional heart care, we have created designated Provider Care Teams.  These Care Teams include your primary Cardiologist (physician) and Advanced Practice Providers (APPs -  Physician Assistants and Nurse Practitioners) who all work together to provide you with the care you need, when you need it.  Thank you for choosing [facility] at [facility]!!     Happy Holidays!!       Signed, Shelva Majestic, MD  12/28/2018 8:24 PM    [facility] [address] [place], [place], [place]  [address] Phone: [phone]

## 2018-12-26 NOTE — Patient Instructions (Signed)
Medication Instructions:  INCREASE AMLODIPINE 7.5MG  (1-1/2 TAB) DAILY If you need a refill on your cardiac medications before your next appointment, please call your pharmacy.   Testing/Procedures: Echocardiogram - Your physician has requested that you have an echocardiogram. Echocardiography is a painless test that uses sound waves to create images of your heart. It provides your doctor with information about the size and shape of your heart and how well your heart's chambers and valves are working. This procedure takes approximately one hour. There are no restrictions for this procedure. This will be performed at our [facility] location - [address], [address].  Special Instructions: TAKE AND LOG BLOOD PRESSURE DAILY 2 WEEKS IF > 145 INCREASE AMLODIPINE AND CALL AND LET us KNOW  Follow-Up: IN 4 months   In Person Shelva Majestic, MD.    At [facility], you and your health needs are our priority.  As part of our continuing mission to provide you with exceptional heart care, we have created designated Provider Care Teams.  These Care Teams include your primary Cardiologist (physician) and Advanced Practice Providers (APPs -  Physician Assistants and Nurse Practitioners) who all work together to provide you with the care you need, when you need it.  Thank you for choosing [facility] at [facility]!!     Happy Holidays!!

## 2018-12-27 ENCOUNTER — Other Ambulatory Visit: Payer: Self-pay | Admitting: Cardiovascular Disease

## 2018-12-28 ENCOUNTER — Encounter: Payer: Self-pay | Admitting: Cardiovascular Disease

## 2019-01-02 ENCOUNTER — Ambulatory Visit: Payer: Medicare Other | Attending: Internal Medicine

## 2019-01-02 DIAGNOSIS — Z20822 Contact with and (suspected) exposure to covid-19: Secondary | ICD-10-CM

## 2019-01-03 LAB — NOVEL CORONAVIRUS, NAA: SARS-CoV-2, NAA: NOT DETECTED

## 2019-01-04 ENCOUNTER — Other Ambulatory Visit: Payer: Medicare Other

## 2019-01-04 ENCOUNTER — Telehealth: Payer: Self-pay

## 2019-01-04 NOTE — Telephone Encounter (Signed)
Caller given negative result and verbalized understanding  

## 2019-01-07 NOTE — Progress Notes (Signed)
PPM remote 

## 2019-01-09 ENCOUNTER — Other Ambulatory Visit (HOSPITAL_COMMUNITY): Payer: Medicare Other

## 2019-01-16 ENCOUNTER — Other Ambulatory Visit: Payer: Self-pay

## 2019-01-16 ENCOUNTER — Ambulatory Visit (HOSPITAL_COMMUNITY): Payer: Medicare Other | Attending: Internal Medicine

## 2019-01-16 DIAGNOSIS — R012 Other cardiac sounds: Secondary | ICD-10-CM

## 2019-01-21 ENCOUNTER — Ambulatory Visit: Payer: Medicare Other | Attending: Internal Medicine

## 2019-01-21 DIAGNOSIS — Z23 Encounter for immunization: Secondary | ICD-10-CM | POA: Insufficient documentation

## 2019-01-21 NOTE — Progress Notes (Signed)
   Covid-19 Vaccination Clinic  Name:  Travis [name]    MRN: [id number] DOB: [birth year]  01/21/2019  [name] was observed post Covid-19 immunization for 15 minutes without incidence. He was provided with Vaccine Information Sheet and instruction to access the V-Safe system.   [name] was instructed to call 911 with any severe reactions post vaccine: Marland Kitchen Difficulty breathing  . Swelling of your face and throat  . A fast heartbeat  . A bad rash all over your body  . Dizziness and weakness    Immunizations Administered    Name Date Dose VIS Date Route   Pfizer COVID-19 Vaccine 01/21/2019  2:08 PM 0.3 mL 12/23/2018 Intramuscular   Manufacturer: [employer], [employer]   Lot: [id number]   NDC: [address]-0

## 2019-02-10 ENCOUNTER — Ambulatory Visit: Payer: Medicare Other

## 2019-02-11 ENCOUNTER — Ambulatory Visit: Payer: Medicare Other | Attending: Internal Medicine

## 2019-02-11 DIAGNOSIS — Z23 Encounter for immunization: Secondary | ICD-10-CM | POA: Insufficient documentation

## 2019-02-11 NOTE — Progress Notes (Signed)
   Covid-19 Vaccination [facility]  Name:  MILES LEYDA    MRN: [id number] DOB: 1940/02/03  02/11/2019  Mr. Bellucci was observed post Covid-19 immunization for 15 minutes without incidence. He was provided with Vaccine Information Sheet and instruction to access the V-Safe system.   Mr. Ground was instructed to call 911 with any severe reactions post vaccine: Marland Kitchen Difficulty breathing  . Swelling of your face and throat  . A fast heartbeat  . A bad rash all over your body  . Dizziness and weakness    Immunizations Administered    Name Date Dose VIS Date Route   Pfizer COVID-19 Vaccine 02/11/2019 11:33 AM 0.3 mL 12/23/2018 Intramuscular   Manufacturer: [employer], [employer]   Lot: [id number]   NDC: [address]-7

## 2019-03-07 ENCOUNTER — Other Ambulatory Visit: Payer: Self-pay

## 2019-03-07 ENCOUNTER — Ambulatory Visit (INDEPENDENT_AMBULATORY_CARE_PROVIDER_SITE_OTHER): Payer: Medicare Other | Admitting: Podiatry

## 2019-03-07 ENCOUNTER — Encounter: Payer: Self-pay | Admitting: Podiatry

## 2019-03-07 VITALS — Temp 96.5°F

## 2019-03-07 DIAGNOSIS — M79675 Pain in left toe(s): Secondary | ICD-10-CM | POA: Diagnosis not present

## 2019-03-07 DIAGNOSIS — B351 Tinea unguium: Secondary | ICD-10-CM | POA: Diagnosis not present

## 2019-03-07 DIAGNOSIS — M79674 Pain in right toe(s): Secondary | ICD-10-CM

## 2019-03-07 NOTE — Patient Instructions (Signed)
Diabetes Mellitus and Foot Care Foot care is an important part of your health, especially when you have diabetes. Diabetes may cause you to have problems because of poor blood flow (circulation) to your feet and legs, which can cause your skin to:  Become thinner and drier.  Break more easily.  Heal more slowly.  Peel and crack. You may also have nerve damage (neuropathy) in your legs and feet, causing decreased feeling in them. This means that you may not notice minor injuries to your feet that could lead to more serious problems. Noticing and addressing any potential problems early is the best way to prevent future foot problems. How to care for your feet Foot hygiene  Wash your feet daily with warm water and mild soap. Do not use hot water. Then, pat your feet and the areas between your toes until they are completely dry. Do not soak your feet as this can dry your skin.  Trim your toenails straight across. Do not dig under them or around the cuticle. File the edges of your nails with an emery board or nail file.  Apply a moisturizing lotion or petroleum jelly to the skin on your feet and to dry, brittle toenails. Use lotion that does not contain alcohol and is unscented. Do not apply lotion between your toes. Shoes and socks  Wear clean socks or stockings every day. Make sure they are not too tight. Do not wear knee-high stockings since they may decrease blood flow to your legs.  Wear shoes that fit properly and have enough cushioning. Always look in your shoes before you put them on to be sure there are no objects inside.  To break in new shoes, wear them for just a few hours a day. This prevents injuries on your feet. Wounds, scrapes, corns, and calluses  Check your feet daily for blisters, cuts, bruises, sores, and redness. If you cannot see the bottom of your feet, use a mirror or ask someone for help.  Do not cut corns or calluses or try to remove them with medicine.  If you  find a minor scrape, cut, or break in the skin on your feet, keep it and the skin around it clean and dry. You may clean these areas with mild soap and water. Do not clean the area with peroxide, alcohol, or iodine.  If you have a wound, scrape, corn, or callus on your foot, look at it several times a day to make sure it is healing and not infected. Check for: ? Redness, swelling, or pain. ? Fluid or blood. ? Warmth. ? Pus or a bad smell. General instructions  Do not cross your legs. This may decrease blood flow to your feet.  Do not use heating pads or hot water bottles on your feet. They may burn your skin. If you have lost feeling in your feet or legs, you may not know this is happening until it is too late.  Protect your feet from hot and cold by wearing shoes, such as at the beach or on hot pavement.  Schedule a complete foot exam at least once a year (annually) or more often if you have foot problems. If you have foot problems, report any cuts, sores, or bruises to your health care provider immediately. Contact a health care provider if:  You have a medical condition that increases your risk of infection and you have any cuts, sores, or bruises on your feet.  You have an injury that is not   healing.  You have redness on your legs or feet.  You feel burning or tingling in your legs or feet.  You have pain or cramps in your legs and feet.  Your legs or feet are numb.  Your feet always feel cold.  You have pain around a toenail. Get help right away if:  You have a wound, scrape, corn, or callus on your foot and: ? You have pain, swelling, or redness that gets worse. ? You have fluid or blood coming from the wound, scrape, corn, or callus. ? Your wound, scrape, corn, or callus feels warm to the touch. ? You have pus or a bad smell coming from the wound, scrape, corn, or callus. ? You have a fever. ? You have a red line going up your leg. Summary  Check your feet every day  for cuts, sores, red spots, swelling, and blisters.  Moisturize feet and legs daily.  Wear shoes that fit properly and have enough cushioning.  If you have foot problems, report any cuts, sores, or bruises to your health care provider immediately.  Schedule a complete foot exam at least once a year (annually) or more often if you have foot problems. This information is not intended to replace advice given to you by your health care provider. Make sure you discuss any questions you have with your health care provider. Document Revised: 09/21/2018 Document Reviewed: 01/31/2016 Elsevier Patient Education  2020 [employer]  

## 2019-03-09 NOTE — Progress Notes (Signed)
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 Pain interferes with ambulation. Aggravating factors include wearing enclosed shoe gear. Pain is relieved with periodic professional debridement.   He continues to exercise daily. He voices no new pedal concerns on today's visit.  No Known Allergies   Objective: Vitals:   03/07/19 1400  Temp: (!) 96.5 F (35.8 C)    Vascular Examination:  Capillary fill time to digits <3s b/l, palpable DP pulses b/l, palpable PT pulses b/l, pedal hair present b/l and skin temperature gradient within normal limits b/l  Dermatological Examination: Pedal skin with normal turgor, texture and tone bilaterally, toenails 1-5 b/l elongated, dystrophic, thickened, crumbly with subungual debris and calluses resolved submet heads 1, 5 bl.  Musculoskeletal: Normal muscle strength 5/5 to all lower extremity muscle groups bilaterally, no gross bony deformities bilaterally and no pain crepitus or joint limitation noted with ROM b/l  Neurological: Protective sensation intact 5/5 intact bilaterally with 10g monofilament b/l and vibratory sensation intact b/l  Assessment: No diagnosis found.  Plan: -Continue diabetic foot care principles. Literature dispensed on today.  -Toenails 1-5 b/l were debrided in length and girth with sterile nail nippers and dremel without iatrogenic bleeding. -Patient to continue soft, supportive shoe gear daily. -Patient to report any pedal injuries to medical professional immediately. -Patient/POA to call should there be question/concern in the interim.  Return in about 3 months (around 06/04/2019) for diabetic nail and callus trim.

## 2019-03-14 ENCOUNTER — Telehealth: Payer: Self-pay

## 2019-03-14 NOTE — Telephone Encounter (Signed)
Pt was trying to send a transmission but received the error code 3230 then [id number]. I gave him the number to [facility] tech support to get additional help.

## 2019-03-15 ENCOUNTER — Telehealth: Payer: Self-pay | Admitting: Cardiovascular Disease

## 2019-03-15 NOTE — Telephone Encounter (Signed)
LMOVM for pt to call my direct office number. I do see in [facility] a monitor was ordered 03-14-2019

## 2019-03-15 NOTE — Telephone Encounter (Signed)
New message   Patient states that he is having an issue in transmitting his device. Please call to discuss.

## 2019-03-15 NOTE — Telephone Encounter (Signed)
The pt called back to state he is receiving a new monitor. It will receive it 5-7 business days.

## 2019-03-20 ENCOUNTER — Ambulatory Visit (INDEPENDENT_AMBULATORY_CARE_PROVIDER_SITE_OTHER): Payer: Medicare Other | Admitting: *Deleted

## 2019-03-20 DIAGNOSIS — Z950 Presence of cardiac pacemaker: Secondary | ICD-10-CM | POA: Diagnosis not present

## 2019-03-21 LAB — CUP PACEART REMOTE DEVICE CHECK
Battery Remaining Longevity: 138 mo
Battery Voltage: 3.02 V
Brady Statistic AP VP Percent: 0.03 %
Brady Statistic AP VS Percent: 49.09 %
Brady Statistic AS VP Percent: 0.03 %
Brady Statistic AS VS Percent: 50.86 %
Brady Statistic RA Percent Paced: 49.21 %
Brady Statistic RV Percent Paced: 0.05 %
Date Time Interrogation Session: 20210308165917
Implantable Lead Implant Date: 20180530
Implantable Lead Implant Date: 20180530
Implantable Lead Location: 753859
Implantable Lead Location: 753860
Implantable Lead Model: 5076
Implantable Lead Model: 5076
Implantable Pulse Generator Implant Date: 20180530
Lead Channel Impedance Value: 323 Ohm
Lead Channel Impedance Value: 361 Ohm
Lead Channel Impedance Value: 380 Ohm
Lead Channel Impedance Value: 437 Ohm
Lead Channel Pacing Threshold Amplitude: 0.5 V
Lead Channel Pacing Threshold Amplitude: 0.75 V
Lead Channel Pacing Threshold Pulse Width: 0.4 ms
Lead Channel Pacing Threshold Pulse Width: 0.4 ms
Lead Channel Sensing Intrinsic Amplitude: 15.875 mV
Lead Channel Sensing Intrinsic Amplitude: 15.875 mV
Lead Channel Sensing Intrinsic Amplitude: 4.75 mV
Lead Channel Sensing Intrinsic Amplitude: 4.75 mV
Lead Channel Setting Pacing Amplitude: 1.5 V
Lead Channel Setting Pacing Amplitude: 2 V
Lead Channel Setting Pacing Pulse Width: 0.4 ms
Lead Channel Setting Sensing Sensitivity: 2 mV

## 2019-03-21 NOTE — Progress Notes (Signed)
PPM Remote  

## 2019-03-21 NOTE — Telephone Encounter (Signed)
Patient called and said he got his new monitor. He tried to send in a remote transmission and wanted to make sure the office got it. Please let him know either way whether the office got it or not

## 2019-03-21 NOTE — Telephone Encounter (Signed)
Remote transmission received 3/8, normal device function, report has already been exported to MD for review.  Attempted to call pt back, no answer.  DPR on record, left message advising pt of above info.

## 2019-04-26 ENCOUNTER — Encounter: Payer: Self-pay | Admitting: Cardiovascular Disease

## 2019-04-26 ENCOUNTER — Ambulatory Visit (INDEPENDENT_AMBULATORY_CARE_PROVIDER_SITE_OTHER): Payer: Medicare Other | Admitting: Cardiovascular Disease

## 2019-04-26 ENCOUNTER — Other Ambulatory Visit: Payer: Self-pay

## 2019-04-26 DIAGNOSIS — I2510 Atherosclerotic heart disease of native coronary artery without angina pectoris: Secondary | ICD-10-CM

## 2019-04-26 DIAGNOSIS — I495 Sick sinus syndrome: Secondary | ICD-10-CM

## 2019-04-26 DIAGNOSIS — I10 Essential (primary) hypertension: Secondary | ICD-10-CM | POA: Diagnosis not present

## 2019-04-26 DIAGNOSIS — M25473 Effusion, unspecified ankle: Secondary | ICD-10-CM

## 2019-04-26 DIAGNOSIS — E785 Hyperlipidemia, unspecified: Secondary | ICD-10-CM | POA: Diagnosis not present

## 2019-04-26 DIAGNOSIS — I358 Other nonrheumatic aortic valve disorders: Secondary | ICD-10-CM

## 2019-04-26 DIAGNOSIS — Z950 Presence of cardiac pacemaker: Secondary | ICD-10-CM

## 2019-04-26 MED ORDER — HYDROCHLOROTHIAZIDE 12.5 MG PO CAPS: 12.5000 mg | ORAL_CAPSULE | Freq: Every day | ORAL | 3 refills | Status: DC

## 2019-04-26 NOTE — Patient Instructions (Signed)
Medication Instructions:  BEGIN TAKING HCTZ 12.5MG DAILY  *If you need a refill on your cardiac medications before your next appointment, please call your pharmacy*  Follow-Up: At [facility], you and your health needs are our priority.  As part of our continuing mission to provide you with exceptional heart care, we have created designated Provider Care Teams.  These Care Teams include your primary Cardiologist (physician) and Advanced Practice Providers (APPs -  Physician Assistants and Nurse Practitioners) who all work together to provide you with the care you need, when you need it.  We recommend signing up for the patient portal called "MyChart".  Sign up information is provided on this After Visit Summary.  MyChart is used to connect with patients for Virtual Visits (Telemedicine).  Patients are able to view lab/test results, encounter notes, upcoming appointments, etc.  Non-urgent messages can be sent to your provider as well.   To learn more about what you can do with MyChart, go to [email or web address].    Your next appointment:   6 month(s)  The format for your next appointment:   In Person  Provider:   Thomas Kelly, MD     

## 2019-04-26 NOTE — Progress Notes (Signed)
Cardiology Office Note    Date:  04/28/2019   ID:  Travis [name], DOB [birth year], MRN [id number]  PCP:  Travis Pao, MD  Referring M.D.: Dr. Leeroy Cha  Cardiologist:  Travis Majestic, MD    History of Present Illness:  Travis [name] is a 79 y.o. male who was initially referred through the courtesy of [facility] for evaluation of lightheadedness and dizziness while running. He presents for a 61-monthfollow-up evaluation.  Mr. SPhegleyhas been active for his entire life.  He has a history of hypertension, hyperlipidemia, and has been diagnosed with type 2 diabetes mellitus for 14 years.  He exercises at least 6 days per week for 1-2 hours.  He does resistance training, spin classes, and typically runs anywhere from 5-6 miles at a 9-10 minute mile pace and has been doing so for many years.  His pulse typically runs in the low 30s.  Recently, he has begun to notice that he becomes dizzy when he starts to run.  This is associated with mild shortness of breath.  He denies any chest pain.  The symptoms have occurred since January.  He denies any frank syncope.  He denies the lightheaded symptoms when he is at rest and his symptoms only occur with significant activity.  When he does spin class he is unaware of any  symptoms.    When I saw for initial evaluation, his ECG revealed marked sinus bradycardia with ventricular rate averaging 37 bpm.  PR interval was 190 ms and QTc interval was 335 ms.  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  He had a 2/6 systolic murmur on physical exam, suggestive of possible aortic valve disease.  I scheduled him for a 2-D echo Doppler study which was done on 06/02/2016.  This showed an ejection fraction at 55-65%.  There was mild LVH.  He did not have any regional wall motion abnormalities.  His aortic valve was reported as structurally normal.  There was a mean gradient of 9 and a  peak gradient of 17.  There was mild MR and mild LA dilation.  His right atrium was mild moderately dilated.  I obtain a magnesium level, which was normal.  I scheduled him for an exercise nuclear stress test to assess both chronotropic competence to exercise as well as myocardial perfusion.  On his exercise portion.  His resting heart rate was 34 bpm, which rose to a maximum of 126/m.  He completed 2 minutes and 2 seconds into stage III of Bruce protocol.  He was able to augment his heart rate appropriately with exercise.  At the end of stage I reflective of good aerobic conditioning ventricular rate was 71, at the end of stage II 100, and in stage III maximum heart rate was 125.  He was entirely asymptomatic but had an exaggerated blood pressure response opted to 16/75 and at peak stress developed asymptomatic ST segment depression up to 3 mm in the inferior and inferolateral leads.  There was a very small mild distal anteroseptal defect which most likely was artifact.  There was no ischemia identified.  He has been wearing an event monitor.  We werereceivingdaily notifications demonstrating marked bradycardia with heart rates in the 30s.  These have been sinus rhythm.  He has been entirely asymptomatic.  He had developed sinus pauses up to 5 - 6 seconds.  He remained entirely asymptomatic and specifically denied  any dizziness or lightheadedness or chest pain.  He was unaware of sleep apnea but he does snore.    At  follow-up on 06/04/2016  I had extensive discussion with him, both concerning his ST segment changes on ECG during stress testing as well as his need for a permanent pacemaker.  Due to scheduling difficulties and since he remained asymptomatic, ultimately, both of these procedures were done on 06/10/2016.  Diagnostic catheterization revealed normal LV function.  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  There was mild 30 and 40% smooth  circumflex narrowings.  Later that same day in the afternoon he underwent successful insertion of a permanent pacemaker by Dr. 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  He tolerated both procedures well.  Following his pacemaker insertion, he has continued to run and has been without dizziness or lightheadedness. He saw Dr. Sallyanne Kuster on 09/15/2016 for pacemaker follow-up evaluation.  He had normal device function.  He is now scheduled to undergo remote download every 3 months and have yearly office visits with Dr. Sallyanne Kuster. His blood pressure at that time was elevated his blood pressure at that time was elevated He is now scheduled to undergo remote downloads every 3 months  He had normal device function.  And no longer experiences any dizziness or lightheadedness.  He saw Dr. Sallyanne Kuster on September 15, 2016 for pacemaker follow-up evaluation.   His blood pressure that day was elevated and his lisinopril was increased to 20 mg and he continued to be on amlodipine.  He feels his blood pressure has been better.  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  In early 2018 had been running approximately 30 miles per week and denied chest pain, PND orthopnea.   I added Zetia to his regimen of atorvastatin.  This is resulted in marked improvement in lipid status and blood work done by his primary physician on March 03, 2017 showed an LDL cholesterol at 31.  He is on lisinopril 20 mg and amlodipine 5 mg for hypertension.  He is on metformin for mild type 2 diabetes mellitus.  Recent hemoglobin A1c was 6.3.  He has had some mild knee discomfort and for this reason has elected not to participate in the nationals and running in June.   When I saw him in December 2019 he continued to remain asymptomatic. He still runs but he had  fractured his left kneecap which had limit his is activity for some time. He was unaware of any palpitations. He denies any chest pressure PND orthopnea. He underwent repeat laboratory on December 17, 2017 which showed excellent lipid studies with total cholesterol 119, HDL 67, LDL 43, and triglycerides 45.   I last evaluated him in a telemedicine visit on June 28, 2018 and he continued to feel well. He now sees Dr. Osborne Casco for his primary care.  He continues to be active and is running 4 to 5 miles per day at least 6 days/week.  He denies any chest tightness or pressure.  He denies any lightheadedness.  He denies PND orthopnea.  A pacemaker remote check on June 2 which revealed stable pacemaker function with stable lead measurements and heart rate histogram.  There apparently was 1 event consistent with a 12 beats of  SVT.  The patient remains asymptomatic.   He saw Dr. Sallyanne Kuster in September 2020.  Presently he remains asymptomatic.  He is running 6 days/week for at least 5 miles a day at a 10 to 11 mile pace.  Blood pressure at home has been slightly elevated in the 329J systolically.  He denies palpitations.  He denies shortness of breath.  He denies leg swelling.    I last saw him on December 26, 2018.  His blood pressure was elevated and at home had been running in the 188 systolically and on repeat by me in the office had increased to 168/88.  I recommended titration of amlodipine 7.5 mg daily if blood pressure continues to be increased after 2 weeks of treatment further titration to 10 mg was recommended.  Over the past several months he has felt well.  His blood pressure significantly improved.  He continues to run most days at least 5 miles at a time typically at 11 to 12-minute pace.  He underwent an echo Doppler study in January 2021 which showed an EF of 55 to 60% with grade 1 diastolic dysfunction.  There was mild to moderate aortic valve sclerosis without stenosis.  There was moderate  dilation of his left atrium with mild dilation of his right atrium.  When I last saw him he did have a suggestion of a possible squeaky friction rub which was positional.  His pericardium was normal with no effusion.  He has noted some swelling in his ankles.  He presents for evaluation.   Past Medical History:  Diagnosis Date  . Heart murmur    "an innocent one" (06/10/2016)  . History of blood transfusion ~ 1956   "while I was in hospital; don't remember why"  . History of kidney stones   . Hyperlipidemia   . Hypertension   . 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 SSS (sick sinus syndrome) ([place]) 06/10/2016  . Type II diabetes mellitus ([place])     Past Surgical History:  Procedure Laterality Date  . APPENDECTOMY    . CARDIAC CATHETERIZATION  06/10/2016  . CATARACT EXTRACTION W/ INTRAOCULAR LENS IMPLANT Right   . CYSTOSCOPY W/ STONE MANIPULATION    . INSERT / REPLACE / REMOVE PACEMAKER  06/10/2016   dual chamber permanent pacemaker  . KNEE ARTHROSCOPY W/ MENISCECTOMY Bilateral   . LAPAROSCOPIC CHOLECYSTECTOMY    . LEFT HEART CATH AND CORONARY ANGIOGRAPHY N/A 06/10/2016   Procedure: Left Heart Cath and Coronary Angiography;  Surgeon: Jolaine Artist, MD;  Location: [facility];  Service: Cardiovascular;  Laterality: N/A;  . LITHOTRIPSY    . PACEMAKER IMPLANT N/A 06/10/2016   Procedure: Pacemaker Implant;  Surgeon: Sanda Klein, MD;  Location: [facility];  Service: Cardiovascular;  Laterality: N/A;  . TONSILLECTOMY      Current Medications: Outpatient Medications Prior to Visit  Medication Sig Dispense Refill  . amLODipine (NORVASC) 5 MG tablet Take 1.5 tablets (7.5 mg total) by mouth daily. 90 tablet 3  . aspirin EC 81 MG tablet Take 81 mg by mouth daily.    Marland Kitchen atorvastatin (LIPITOR) 40 MG tablet TAKE 1 TABLET ONCE DAILY. 90 tablet 3  . Cholecalciferol (VITAMIN D3) 2000 units capsule Take 2,000 Units by mouth daily.    . COMBIGAN 0.2-0.5 %  ophthalmic solution Place 1 drop into both eyes 2 (two) times daily.     Marland Kitchen ezetimibe (ZETIA) 10 MG tablet TAKE 1 TABLET ONCE DAILY. 90 tablet 3  .  lisinopril (ZESTRIL) 20 MG tablet TAKE 1 TABLET ONCE DAILY. 90 tablet 4  . metFORMIN (GLUCOPHAGE) 500 MG tablet Take 500 mg by mouth 2 (two) times daily with a meal.      No facility-administered medications prior to visit.     Allergies:   Patient has no known allergies.   Social History   Socioeconomic History  . Marital status: Married    Spouse name: Not on file  . Number of children: Not on file  . Years of education: Not on file  . Highest education level: Not on file  Occupational History  . Not on file  Tobacco Use  . Smoking status: Former Smoker    Packs/day: 0.50    Years: 25.00    Pack years: 12.50    Types: Cigarettes    Quit date: 1983    Years since quitting: 38.3  . Smokeless tobacco: Never Used  Substance and Sexual Activity  . Alcohol use: Yes    Comment: 06/10/2016 "might have 2 beers/month"  . Drug use: No  . Sexual activity: Not on file  Other Topics Concern  . Not on file  Social History Narrative  . Not on file   Social Determinants of Health   Financial Resource Strain:   . Difficulty of Paying Living Expenses:   Food Insecurity:   . Worried About Charity fundraiser in the Last Year:   . Arboriculturist in the Last Year:   Transportation Needs:   . Film/video editor (Medical):   Marland Kitchen Lack of Transportation (Non-Medical):   Physical Activity:   . Days of Exercise per Week:   . Minutes of Exercise per Session:   Stress:   . Feeling of Stress :   Social Connections:   . Frequency of Communication with Friends and Family:   . Frequency of Social Gatherings with Friends and Family:   . Attends Religious Services:   . Active Member of Clubs or Organizations:   . Attends Archivist Meetings:   Marland Kitchen Marital Status:     Social history is notable in that he was born in [place].  He's been  married for 54 years.  He has 2 children, 7 grandchildren, and one great-grandchild.  He has a Scientist, water quality in Theatre stage manager.  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  He is retired.  He drinks occasional beer.  He does not smoke.   Family History:  The patient's family history includes Colon cancer in his father..  He has a brother age 1 and a sister age 24, who are healthy.   ROS General: Negative; No fevers, chills, or night sweats;  HEENT: Negative; No changes in vision or hearing, sinus congestion, difficulty swallowing Pulmonary: Negative; No cough, wheezing, shortness of breath, hemoptysis Cardiovascular:  See HPI GI: Negative; No nausea, vomiting, diarrhea, or abdominal pain GU: Negative; No dysuria, hematuria, or difficulty voiding Musculoskeletal: Left knee discomfort Hematologic/Oncology: Negative; no easy bruising, bleeding Endocrine: Negative; no heat/cold intolerance; no diabetes Neuro: Negative; no changes in balance, headaches Skin: Negative; No rashes or skin lesions Psychiatric: Negative; No behavioral problems, depression Sleep: Positive for snoring, no daytime sleepiness, hypersomnolence, bruxism, restless legs, hypnogognic hallucinations, no cataplexy Other comprehensive 14 point system review is negative.   PHYSICAL EXAM:   VS:  BP (!) 142/68   Pulse (!) 55   Ht '5\' 11"'$  (1.803 m)   Wt 163 lb 3.2 oz (74 kg)  SpO2 100%   BMI 22.76 kg/m     Repeat blood pressure by me was 144/70  Wt Readings from Last 3 Encounters:  04/26/19 163 lb 3.2 oz (74 kg)  12/26/18 164 lb (74.4 kg)  10/03/18 160 lb 12.8 oz (72.9 kg)     BP (!) 142/68   Pulse (!) 55   Ht '5\' 11"'$  (1.803 m)   Wt 163 lb 3.2 oz (74 kg)   SpO2 100%   BMI 22.76 kg/m  General: Alert, oriented, no distress.  Skin: normal turgor, no rashes, warm and dry HEENT: Normocephalic, atraumatic. Pupils equal round and reactive to light; sclera anicteric; extraocular muscles  intact;  Nose without nasal septal hypertrophy Mouth/Parynx benign; Mallinpatti scale 2 Neck: No JVD, no carotid bruits; normal carotid upstroke Lungs: clear to ausculatation and percussion; no wheezing or rales Chest wall: without tenderness to palpitation Heart: PMI not displaced, RRR, s1 s2 normal, 1/6 systolic murmur, no diastolic murmur, no rubs, gallops, thrills, or heaves; previous squeaking rub is no longer present. Abdomen: soft, nontender; no hepatosplenomehaly, BS+; abdominal aorta nontender and not dilated by palpation. Back: no CVA tenderness Pulses 2+ Musculoskeletal: full range of motion, normal strength, no joint deformities Extremities: no clubbing cyanosis or edema, Homan's sign negative  Neurologic: grossly nonfocal; Cranial nerves grossly wnl Psychologic: Normal mood and affect    Studies/Labs Reviewed:   ECG (independently read by me): Atrially paced at 55 bpm.  PR interval 218 ms.  No ectopy.  December 2020 ECG (independently read by me): Atrially paced rhythm at 69 bpm with prolonged AV conduction, PR interval 222 ms.  LVH with repolarization changes.  December 2019 ECG (independently read by me): Atrially paced rhythm with PVCs and transient ventricular bigeminy.  Prolonged AV conduction with PR interval 230 ms.  May 2019 ECG (independently read by me): Atrial paced rhythm at 51 bpm.  Prolonged AV conduction with a PR interval of 242 ms.  QTc interval 383 ms.  November 24, 2016 EKG:  EKG is ordered today.  A paced rhythm at 51 bpm.  PR interval 222 ms.  Mild LVH.  QTc interval 372 ms  06/16/2016 ECG (independently read by me): Atrially paced rhythm at 50 bpm with prolonged AV conduction with a PR interval at 214 ms.  06/04/2016 ECG (independently read by me): Marked sinus bradycardia with possible low atrial involvement.  PR interval 166 ms, QTc interval 335 ms.  Ventricular rate 36.  05/20/2016 ECG (independently read by me): Marked sinus bradycardia with  sinus arrhythmia, ventricular rate in the 40s to upper 30s, average 37.  PR interval 190 ms.  QTc interval 335 ms.  No ST segment changes.  Recent Labs: BMP Latest Ref Rng & Units 12/17/2017 03/03/2017 11/27/2016  Glucose 65 - 99 mg/dL 135(H) 119(H) 108(H)  BUN 8 - 27 mg/dL '18 22 23  '$ Creatinine 0.76 - 1.27 mg/dL 1.01 1.13 1.12  BUN/Creat Ratio 10 - '24 18 19 21  '$ Sodium 134 - 144 mmol/L 141 140 138  Potassium 3.5 - 5.2 mmol/L 4.5 4.7 4.2  Chloride 96 - 106 mmol/L 102 106 98  CO2 20 - 29 mmol/L '23 23 26  '$ Calcium 8.6 - 10.2 mg/dL 10.2 10.2 10.2     Hepatic Function Latest Ref Rng & Units 12/17/2017 03/03/2017 11/17/2016  Total Protein 6.0 - 8.5 g/dL 6.5 6.4 7.1  Albumin 3.5 - 4.8 g/dL 4.6 4.5 4.8  AST 0 - 40 IU/L '17 19 20  '$ ALT 0 - 44 IU/L  $'21 21 19  'q$ Alk Phosphatase 39 - 117 IU/L 77 62 82  Total Bilirubin 0.0 - 1.2 mg/dL 1.1 1.2 1.3(H)    CBC Latest Ref Rng & Units 06/04/2016  WBC 3.4 - 10.8 x10E3/uL 7.1  Hemoglobin 13.0 - 17.7 g/dL 14.2  Hematocrit 37.5 - 51.0 % 41.9  Platelets 150 - 379 x10E3/uL 260   Lab Results  Component Value Date   MCV 96 06/04/2016   No results found for: TSH Lab Results  Component Value Date   HGBA1C 7.7 (H) 12/17/2017     BNP No results found for: BNP  ProBNP No results found for: PROBNP   Lipid Panel     Component Value Date/Time   CHOL 119 12/17/2017 0839   TRIG 45 12/17/2017 0839   HDL 67 12/17/2017 0839   CHOLHDL 1.8 12/17/2017 0839   LDLCALC 43 12/17/2017 0839     RADIOLOGY: No results found.   Additional studies/ records that were reviewed today include:  I reviewed the recent records from [facility]. I have reviewed his 2-D echo Doppler study, exercise Myoview study, laboratory, as well as daily  Monitors with critical notification from the event monitor prior to the insertion of the pacemaker.  I personally reviewed the catheterization findings, as well as his pacemaker implantation report.  Subsequent office visits with Dr.  Sallyanne Kuster have been reviewed as well as recent laboratory from October 06, 2018.   ASSESSMENT:    1. Essential hypertension   2. Coronary artery disease involving native coronary artery of native heart without angina pectoris   3. Hyperlipidemia with target LDL less than 70   4. SSS (sick sinus syndrome) ([facility])   5. Pacemaker   6. Aortic valve sclerosis   7. Ankle edema     PLAN:  Mr. Yoan Sallade is a very pleasant, young appearing active 79 year-old gentleman who has a long-standing history of significant exercise and has been exercising for at least 1-2 hours per day 6 days per week.  When I saw him he was running 5 to 6 miles at a time and was doing spin class.  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  Prior to his pacemaker cardiac catheterization revealed a 60 to 70% calcified LAD stenosis with normal LV function.  Subsequently, he has continued to be asymptomatic.  We have been very aggressive with lipid-lowering therapy and LDL cholesterol have been running in the 40s.  Laboratory from October 06, 2018 shows a total cholesterol of 103, HDL 52, LDL 42, and triglycerides 46 on his regimen consisting of atorvastatin 40 mg and Zetia 10 mg.  I last saw him, blood pressure was elevated.  Blood pressure today is improved but still mildly elevated on his current regimen consisting of amlodipine 7.5 mg daily, lisinopril 20 mg.  He also has started to notice some mild ankle swelling which may be contributed by the amlodipine.  With his continued mild blood pressure elevation I have suggested the addition of HCTZ 12.5 mg added to his medical regimen.  He is on combination therapy with atorvastatin 40 mg and Zetia 10 mg for hyperlipidemia.  Laboratory in September 2020 showed an LDL cholesterol at 42.  He sees Dr. Osborne Casco for primary care.  He is diabetic Metformin 500 mg twice a day and  hemoglobin A1c on April 13, 2019 was 6.9.  He continues to be active and still  runs at least 5 miles per day.  I will see him in 6 months for reevaluation or sooner if problems arise.   Medication Adjustments/Labs and Tests Ordered: Current medicines are reviewed at length with the patient today.  Concerns regarding medicines are outlined above.  Medication changes, Labs and Tests ordered today are listed in the Patient Instructions below. Patient Instructions  Medication Instructions:  BEGIN TAKING HCTZ 12.'5MG'$  DAILY *If you need a refill on your cardiac medications before your next appointment, please call your pharmacy*    Follow-Up: At [facility], you and your health needs are our priority.  As part of our continuing mission to provide you with exceptional heart care, we have created designated Provider Care Teams.  These Care Teams include your primary Cardiologist (physician) and Advanced Practice Providers (APPs -  Physician Assistants and Nurse Practitioners) who all work together to provide you with the care you need, when you need it.  We recommend signing up for the patient portal called "MyChart".  Sign up information is provided on this After Visit Summary.  MyChart is used to connect with patients for Virtual Visits (Telemedicine).  Patients are able to view lab/test results, encounter notes, upcoming appointments, etc.  Non-urgent messages can be sent to your provider as well.   To learn more about what you can do with MyChart, go to [email or web address].    Your next appointment:   6 month(s)  The format for your next appointment:   In Person  Provider:   Shelva Majestic, MD      Signed, Travis Majestic, MD  04/28/2019 6:13 PM    [facility] [address] [place], [place], [place]  [address] Phone: [phone]

## 2019-04-28 ENCOUNTER — Encounter: Payer: Self-pay | Admitting: Cardiovascular Disease

## 2019-06-06 ENCOUNTER — Ambulatory Visit (INDEPENDENT_AMBULATORY_CARE_PROVIDER_SITE_OTHER): Payer: Medicare Other | Admitting: Podiatry

## 2019-06-06 ENCOUNTER — Other Ambulatory Visit: Payer: Self-pay

## 2019-06-06 ENCOUNTER — Encounter: Payer: Self-pay | Admitting: Podiatry

## 2019-06-06 DIAGNOSIS — E118 Type 2 diabetes mellitus with unspecified complications: Secondary | ICD-10-CM | POA: Diagnosis not present

## 2019-06-06 DIAGNOSIS — M79674 Pain in right toe(s): Secondary | ICD-10-CM

## 2019-06-06 DIAGNOSIS — B351 Tinea unguium: Secondary | ICD-10-CM | POA: Diagnosis not present

## 2019-06-06 DIAGNOSIS — M79675 Pain in left toe(s): Secondary | ICD-10-CM | POA: Diagnosis not present

## 2019-06-06 NOTE — Patient Instructions (Signed)

## 2019-06-12 NOTE — Progress Notes (Signed)
Subjective: Travis [name] is a 79 y.o. male patient seen today preventative diabetic foot care and painful mycotic nails b/l that are difficult to trim. Pain interferes with ambulation. Aggravating factors include wearing enclosed shoe gear. Pain is relieved with periodic professional debridement.  Travis [name] continues to run 5 miles/day, 6 days per week.  He voices no new pedal problems on today's visit.  Patient Active Problem List   Diagnosis Date Noted  . Left patella fracture 12/16/2017  . CAD (coronary artery disease), native coronary artery 09/14/2016  . Pacemaker 06/10/2016  . Paroxysmal atrial fibrillation (HCC) 06/07/2016  . Sinus pause 06/07/2016  . Bradycardia 06/04/2016  . Groin pain 06/29/2014  . Piriformis syndrome of left side 10/30/2013    Current Outpatient Medications on File Prior to Visit  Medication Sig Dispense Refill  . amLODipine (NORVASC) 5 MG tablet Take 1.5 tablets (7.5 mg total) by mouth daily. 90 tablet 3  . aspirin EC 81 MG tablet Take 81 mg by mouth daily.    Marland Kitchen atorvastatin (LIPITOR) 40 MG tablet TAKE 1 TABLET ONCE DAILY. 90 tablet 3  . Cholecalciferol (VITAMIN D3) 2000 units capsule Take 2,000 Units by mouth daily.    . COMBIGAN 0.2-0.5 % ophthalmic solution Place 1 drop into both eyes 2 (two) times daily.     Marland Kitchen ezetimibe (ZETIA) 10 MG tablet TAKE 1 TABLET ONCE DAILY. 90 tablet 3  . hydrochlorothiazide (MICROZIDE) 12.5 MG capsule Take 1 capsule (12.5 mg total) by mouth daily. 90 capsule 3  . lisinopril (ZESTRIL) 20 MG tablet TAKE 1 TABLET ONCE DAILY. 90 tablet 4  . metFORMIN (GLUCOPHAGE) 500 MG tablet Take 500 mg by mouth 2 (two) times daily with a meal.      No current facility-administered medications on file prior to visit.    No Known Allergies  Objective: Physical Exam  General: Travis [name] is a pleasant 79 y.o. Caucasian male, in NAD. AAO x 3.   Vascular:  Neurovascular status unchanged b/l LE.. Capillary fill time to digits <3 seconds  b/l LE. Palpable DP pulses b/l. Palpable PT pulses b/l. Pedal hair present b/l. Skin temperature gradient within normal limits b/l. No edema noted b/l.  Dermatological:  Pedal skin with normal turgor, texture and tone bilaterally. No open wounds bilaterally. No interdigital macerations bilaterally. Toenails 1-5 b/l elongated, discolored, dystrophic, thickened, crumbly with subungual debris and tenderness to dorsal palpation.  Musculoskeletal:  Normal muscle strength 5/5 to all lower extremity muscle groups bilaterally. No pain crepitus or joint limitation noted with ROM b/l. No gross bony deformities bilaterally.  Neurological:  Protective sensation intact 5/5 intact bilaterally with 10g monofilament b/l. Vibratory sensation intact b/l. Proprioception intact bilaterally.  Assessment and Plan:  1. Pain due to onychomycosis of toenails of both feet   2. Type 2 diabetes mellitus with complication, without long-term current use of insulin (HCC)    -Examined patient. -No new findings. No new orders. -Toenails 1-5 b/l were debrided in length and girth with sterile nail nippers and dremel without iatrogenic bleeding.  -Patient to continue soft, supportive shoe gear daily. -Patient to report any pedal injuries to medical professional immediately. -Patient/POA to call should there be question/concern in the interim.  Return in about 3 months (around 09/06/2019) for nail trim.  Freddie Breech, DPM

## 2019-06-19 ENCOUNTER — Ambulatory Visit (INDEPENDENT_AMBULATORY_CARE_PROVIDER_SITE_OTHER): Payer: Medicare Other | Admitting: *Deleted

## 2019-06-19 DIAGNOSIS — I480 Paroxysmal atrial fibrillation: Secondary | ICD-10-CM

## 2019-06-19 LAB — CUP PACEART REMOTE DEVICE CHECK
Battery Remaining Longevity: 134 mo
Battery Voltage: 3.02 V
Brady Statistic AP VP Percent: 0.01 %
Brady Statistic AP VS Percent: 36.69 %
Brady Statistic AS VP Percent: 0.03 %
Brady Statistic AS VS Percent: 63.27 %
Brady Statistic RA Percent Paced: 37.41 %
Brady Statistic RV Percent Paced: 0.04 %
Date Time Interrogation Session: 20210607024841
Implantable Lead Implant Date: 20180530
Implantable Lead Implant Date: 20180530
Implantable Lead Location: 753859
Implantable Lead Location: 753860
Implantable Lead Model: 5076
Implantable Lead Model: 5076
Implantable Pulse Generator Implant Date: 20180530
Lead Channel Impedance Value: 304 Ohm
Lead Channel Impedance Value: 342 Ohm
Lead Channel Impedance Value: 380 Ohm
Lead Channel Impedance Value: 418 Ohm
Lead Channel Pacing Threshold Amplitude: 0.625 V
Lead Channel Pacing Threshold Amplitude: 0.625 V
Lead Channel Pacing Threshold Pulse Width: 0.4 ms
Lead Channel Pacing Threshold Pulse Width: 0.4 ms
Lead Channel Sensing Intrinsic Amplitude: 15.5 mV
Lead Channel Sensing Intrinsic Amplitude: 15.5 mV
Lead Channel Sensing Intrinsic Amplitude: 4.5 mV
Lead Channel Sensing Intrinsic Amplitude: 4.5 mV
Lead Channel Setting Pacing Amplitude: 1.5 V
Lead Channel Setting Pacing Amplitude: 2 V
Lead Channel Setting Pacing Pulse Width: 0.4 ms
Lead Channel Setting Sensing Sensitivity: 2 mV

## 2019-06-21 NOTE — Progress Notes (Signed)
Remote pacemaker transmission.   

## 2019-09-06 ENCOUNTER — Other Ambulatory Visit: Payer: Self-pay

## 2019-09-06 ENCOUNTER — Encounter: Payer: Self-pay | Admitting: Podiatry

## 2019-09-06 ENCOUNTER — Ambulatory Visit: Payer: Medicare Other | Admitting: Podiatry

## 2019-09-06 ENCOUNTER — Ambulatory Visit (INDEPENDENT_AMBULATORY_CARE_PROVIDER_SITE_OTHER): Payer: Medicare Other | Admitting: Podiatry

## 2019-09-06 DIAGNOSIS — E119 Type 2 diabetes mellitus without complications: Secondary | ICD-10-CM | POA: Diagnosis not present

## 2019-09-06 DIAGNOSIS — B351 Tinea unguium: Secondary | ICD-10-CM | POA: Diagnosis not present

## 2019-09-06 DIAGNOSIS — M79674 Pain in right toe(s): Secondary | ICD-10-CM

## 2019-09-06 DIAGNOSIS — L84 Corns and callosities: Secondary | ICD-10-CM | POA: Diagnosis not present

## 2019-09-06 DIAGNOSIS — M79675 Pain in left toe(s): Secondary | ICD-10-CM

## 2019-09-06 NOTE — Patient Instructions (Signed)
Diabetes Mellitus and Foot Care Foot care is an important part of your health, especially when you have diabetes. Diabetes may cause you to have problems because of poor blood flow (circulation) to your feet and legs, which can cause your skin to:  Become thinner and drier.  Break more easily.  Heal more slowly.  Peel and crack. You may also have nerve damage (neuropathy) in your legs and feet, causing decreased feeling in them. This means that you may not notice minor injuries to your feet that could lead to more serious problems. Noticing and addressing any potential problems early is the best way to prevent future foot problems. How to care for your feet Foot hygiene  Wash your feet daily with warm water and mild soap. Do not use hot water. Then, pat your feet and the areas between your toes until they are completely dry. Do not soak your feet as this can dry your skin.  Trim your toenails straight across. Do not dig under them or around the cuticle. File the edges of your nails with an emery board or nail file.  Apply a moisturizing lotion or petroleum jelly to the skin on your feet and to dry, brittle toenails. Use lotion that does not contain alcohol and is unscented. Do not apply lotion between your toes. Shoes and socks  Wear clean socks or stockings every day. Make sure they are not too tight. Do not wear knee-high stockings since they may decrease blood flow to your legs.  Wear shoes that fit properly and have enough cushioning. Always look in your shoes before you put them on to be sure there are no objects inside.  To break in new shoes, wear them for just a few hours a day. This prevents injuries on your feet. Wounds, scrapes, corns, and calluses  Check your feet daily for blisters, cuts, bruises, sores, and redness. If you cannot see the bottom of your feet, use a mirror or ask someone for help.  Do not cut corns or calluses or try to remove them with medicine.  If you  find a minor scrape, cut, or break in the skin on your feet, keep it and the skin around it clean and dry. You may clean these areas with mild soap and water. Do not clean the area with peroxide, alcohol, or iodine.  If you have a wound, scrape, corn, or callus on your foot, look at it several times a day to make sure it is healing and not infected. Check for: ? Redness, swelling, or pain. ? Fluid or blood. ? Warmth. ? Pus or a bad smell. General instructions  Do not cross your legs. This may decrease blood flow to your feet.  Do not use heating pads or hot water bottles on your feet. They may burn your skin. If you have lost feeling in your feet or legs, you may not know this is happening until it is too late.  Protect your feet from hot and cold by wearing shoes, such as at the beach or on hot pavement.  Schedule a complete foot exam at least once a year (annually) or more often if you have foot problems. If you have foot problems, report any cuts, sores, or bruises to your health care provider immediately. Contact a health care provider if:  You have a medical condition that increases your risk of infection and you have any cuts, sores, or bruises on your feet.  You have an injury that is not   healing.  You have redness on your legs or feet.  You feel burning or tingling in your legs or feet.  You have pain or cramps in your legs and feet.  Your legs or feet are numb.  Your feet always feel cold.  You have pain around a toenail. Get help right away if:  You have a wound, scrape, corn, or callus on your foot and: ? You have pain, swelling, or redness that gets worse. ? You have fluid or blood coming from the wound, scrape, corn, or callus. ? Your wound, scrape, corn, or callus feels warm to the touch. ? You have pus or a bad smell coming from the wound, scrape, corn, or callus. ? You have a fever. ? You have a red line going up your leg. Summary  Check your feet every day  for cuts, sores, red spots, swelling, and blisters.  Moisturize feet and legs daily.  Wear shoes that fit properly and have enough cushioning.  If you have foot problems, report any cuts, sores, or bruises to your health care provider immediately.  Schedule a complete foot exam at least once a year (annually) or more often if you have foot problems. This information is not intended to replace advice given to you by your health care provider. Make sure you discuss any questions you have with your health care provider. Document Revised: 09/21/2018 Document Reviewed: 01/31/2016 Elsevier Patient Education  2020 [employer]  Onychomycosis/Fungal Toenails  WHAT IS IT? An infection that lies within the keratin of your nail plate that is caused by a fungus.  WHY ME? Fungal infections affect all ages, sexes, races, and creeds.  There may be many factors that predispose you to a fungal infection such as age, coexisting medical conditions such as diabetes, or an autoimmune disease; stress, medications, fatigue, genetics, etc.  Bottom line: fungus thrives in a warm, moist environment and your shoes offer such a location.  IS IT CONTAGIOUS? Theoretically, yes.  You do not want to share shoes, nail clippers or files with someone who has fungal toenails.  Walking around barefoot in the same room or sleeping in the same bed is unlikely to transfer the organism.  It is important to realize, however, that fungus can spread easily from one nail to the next on the same foot.  HOW DO WE TREAT THIS?  There are several ways to treat this condition.  Treatment may depend on many factors such as age, medications, pregnancy, liver and kidney conditions, etc.  It is best to ask your doctor which options are available to you.  5. No treatment.   Unlike many other medical concerns, you can live with this condition.  However for many people this can be a painful condition and may lead to ingrown toenails or a bacterial  infection.  It is recommended that you keep the nails cut short to help reduce the amount of fungal nail. 6. Topical treatment.  These range from herbal remedies to prescription strength nail lacquers.  About 40-50% effective, topicals require twice daily application for approximately 9 to 12 months or until an entirely new nail has grown out.  The most effective topicals are medical grade medications available through physicians offices. 7. Oral antifungal medications.  With an 80-90% cure rate, the most common oral medication requires 3 to 4 months of therapy and stays in your system for a year as the new nail grows out.  Oral antifungal medications do require blood work to make   sure it is a safe drug for you.  A liver function panel will be performed prior to starting the medication and after the first month of treatment.  It is important to have the blood work performed to avoid any harmful side effects.  In general, this medication safe but blood work is required. 8. Laser Therapy.  This treatment is performed by applying a specialized laser to the affected nail plate.  This therapy is noninvasive, fast, and non-painful.  It is not covered by insurance and is therefore, out of pocket.  The results have been very good with a 80-95% cure rate.  [facility] is the only practice in the area to offer this therapy. 9. Permanent Nail Avulsion.  Removing the entire nail so that a new nail will not grow back. 

## 2019-09-10 NOTE — Progress Notes (Signed)
Subjective: Travis [name] is a 79 y.o. male patient seen today preventative diabetic foot care and painful mycotic nails b/l that are difficult to trim. Pain interferes with ambulation. Aggravating factors include wearing enclosed shoe gear. Pain is relieved with periodic professional debridement.  Travis [name] continues to run 5 miles/day. Last A1c was 7.7%.  He voices no new pedal problems on today's visit.  Patient Active Problem List   Diagnosis Date Noted  . Left patella fracture 12/16/2017  . CAD (coronary artery disease), native coronary artery 09/14/2016  . Pacemaker 06/10/2016  . Paroxysmal atrial fibrillation (HCC) 06/07/2016  . Sinus pause 06/07/2016  . Bradycardia 06/04/2016  . Groin pain 06/29/2014  . Piriformis syndrome of left side 10/30/2013    Current Outpatient Medications on File Prior to Visit  Medication Sig Dispense Refill  . amLODipine (NORVASC) 5 MG tablet Take 1.5 tablets (7.5 mg total) by mouth daily. 90 tablet 3  . aspirin EC 81 MG tablet Take 81 mg by mouth daily.    Marland Kitchen atorvastatin (LIPITOR) 40 MG tablet TAKE 1 TABLET ONCE DAILY. 90 tablet 3  . Cholecalciferol (VITAMIN D3) 2000 units capsule Take 2,000 Units by mouth daily.    . COMBIGAN 0.2-0.5 % ophthalmic solution Place 1 drop into both eyes 2 (two) times daily.     Marland Kitchen ezetimibe (ZETIA) 10 MG tablet TAKE 1 TABLET ONCE DAILY. 90 tablet 3  . hydrochlorothiazide (MICROZIDE) 12.5 MG capsule Take 1 capsule (12.5 mg total) by mouth daily. 90 capsule 3  . lisinopril (ZESTRIL) 20 MG tablet TAKE 1 TABLET ONCE DAILY. 90 tablet 4  . metFORMIN (GLUCOPHAGE) 500 MG tablet Take 500 mg by mouth 2 (two) times daily with a meal.      No current facility-administered medications on file prior to visit.    No Known Allergies  Objective: Physical Exam  General: Travis [name] is a pleasant 79 y.o. Caucasian male, in NAD. AAO x 3.   Vascular:  Neurovascular status unchanged b/l LE.. Capillary fill time to digits <3  seconds b/l LE. Palpable DP pulses b/l. Palpable PT pulses b/l. Pedal hair present b/l. Skin temperature gradient within normal limits b/l. No edema noted b/l.  Dermatological:  Pedal skin with normal turgor, texture and tone bilaterally. No open wounds bilaterally. No interdigital macerations bilaterally. Toenails 1-5 b/l elongated, discolored, dystrophic, thickened, crumbly with subungual debris and tenderness to dorsal palpation. Hyperkeratotic lesion(s) submet head 1 left foot and submet head 1 right foot.  No erythema, no edema, no drainage, no flocculence.  Musculoskeletal:  Normal muscle strength 5/5 to all lower extremity muscle groups bilaterally. No pain crepitus or joint limitation noted with ROM b/l. No gross bony deformities bilaterally.  Neurological:  Protective sensation intact 5/5 intact bilaterally with 10g monofilament b/l. Vibratory sensation intact b/l. Proprioception intact bilaterally.  Assessment and Plan:  1. Pain due to onychomycosis of toenails of both feet   2. Callus   3. Controlled type 2 diabetes mellitus without complication, without long-term current use of insulin (HCC)    -Examined patient. -Toenails 1-5 b/l were debrided in length and girth with sterile nail nippers and dremel without iatrogenic bleeding.  -Callus(es) submet head 1 left foot and submet head 1 right foot pared utilizing sterile scalpel blade without complication or incident. Total number debrided =2. -Patient to report any pedal injuries to medical professional immediately. -Patient to continue soft, supportive shoe gear daily. -Patient/POA to call should there be question/concern in the interim.  Return in  about 3 months (around 12/07/2019) for diabetic nail trim.  Freddie Breech, DPM

## 2019-09-19 ENCOUNTER — Ambulatory Visit (INDEPENDENT_AMBULATORY_CARE_PROVIDER_SITE_OTHER): Payer: Medicare Other | Admitting: *Deleted

## 2019-09-19 DIAGNOSIS — I495 Sick sinus syndrome: Secondary | ICD-10-CM

## 2019-09-19 LAB — CUP PACEART REMOTE DEVICE CHECK
Battery Remaining Longevity: 130 mo
Battery Voltage: 3.01 V
Brady Statistic AP VP Percent: 0.19 %
Brady Statistic AP VS Percent: 56.76 %
Brady Statistic AS VP Percent: 0.05 %
Brady Statistic AS VS Percent: 43 %
Brady Statistic RA Percent Paced: 56.97 %
Brady Statistic RV Percent Paced: 0.24 %
Date Time Interrogation Session: 20210907002824
Implantable Lead Implant Date: 20180530
Implantable Lead Implant Date: 20180530
Implantable Lead Location: 753859
Implantable Lead Location: 753860
Implantable Lead Model: 5076
Implantable Lead Model: 5076
Implantable Pulse Generator Implant Date: 20180530
Lead Channel Impedance Value: 304 Ohm
Lead Channel Impedance Value: 342 Ohm
Lead Channel Impedance Value: 361 Ohm
Lead Channel Impedance Value: 399 Ohm
Lead Channel Pacing Threshold Amplitude: 0.625 V
Lead Channel Pacing Threshold Amplitude: 0.75 V
Lead Channel Pacing Threshold Pulse Width: 0.4 ms
Lead Channel Pacing Threshold Pulse Width: 0.4 ms
Lead Channel Sensing Intrinsic Amplitude: 15.5 mV
Lead Channel Sensing Intrinsic Amplitude: 15.5 mV
Lead Channel Sensing Intrinsic Amplitude: 4.625 mV
Lead Channel Sensing Intrinsic Amplitude: 4.625 mV
Lead Channel Setting Pacing Amplitude: 1.5 V
Lead Channel Setting Pacing Amplitude: 2 V
Lead Channel Setting Pacing Pulse Width: 0.4 ms
Lead Channel Setting Sensing Sensitivity: 2 mV

## 2019-09-21 NOTE — Progress Notes (Signed)
Remote pacemaker transmission.   

## 2019-10-23 ENCOUNTER — Ambulatory Visit (INDEPENDENT_AMBULATORY_CARE_PROVIDER_SITE_OTHER): Payer: Medicare Other | Admitting: Cardiovascular Disease

## 2019-10-23 ENCOUNTER — Encounter: Payer: Self-pay | Admitting: Cardiovascular Disease

## 2019-10-23 VITALS — BP 116/67 | HR 54 | Ht 71.0 in | Wt 158.6 lb

## 2019-10-23 DIAGNOSIS — I10 Essential (primary) hypertension: Secondary | ICD-10-CM

## 2019-10-23 DIAGNOSIS — I350 Nonrheumatic aortic (valve) stenosis: Secondary | ICD-10-CM | POA: Diagnosis not present

## 2019-10-23 DIAGNOSIS — Z950 Presence of cardiac pacemaker: Secondary | ICD-10-CM

## 2019-10-23 DIAGNOSIS — I2510 Atherosclerotic heart disease of native coronary artery without angina pectoris: Secondary | ICD-10-CM

## 2019-10-23 DIAGNOSIS — I472 Ventricular tachycardia: Secondary | ICD-10-CM

## 2019-10-23 DIAGNOSIS — I480 Paroxysmal atrial fibrillation: Secondary | ICD-10-CM

## 2019-10-23 DIAGNOSIS — I495 Sick sinus syndrome: Secondary | ICD-10-CM

## 2019-10-23 DIAGNOSIS — I4729 Other ventricular tachycardia: Secondary | ICD-10-CM

## 2019-10-23 NOTE — Progress Notes (Signed)
Cardiology Office Note:    Date:  10/24/2019   ID:  Travis MingsJames F Spurlock, DOB 1940-05-25, MRN [id number]  PCP:  Gaspar Garbeisovec, Richard W, MD  Cardiologist: Nicki GuadalajaraKelly, Thomas M.D.; Thurmon FairMihai Daziya Redmond, MD    Referring MD: Wylene Simmerisovec, Adelfa Kohichard W, MD  Chief Complaint  Patient presents with  . Pacemaker Check     History of Present Illness:    Travis [name] is a 79 y.o. male with a hx of Hypertension and hyperlipidemia, without known coronary vascular problems to date, severe sinus bradycardia, periods of junctional escape rhythm and sinus pauses of up to 6 seconds in duration on an event monitor.   He underwent elective implantation of a dual-chamber Medtronic Azure XT permanent pacemaker on 06/10/2016. He had coronary angiography the same day which showed 65% mid LAD, 70% second diagonal, 30-40%stenoses in the mid circumflex.  He has mild aortic valve stenosis by an echo performed in May 2020.  The patient specifically denies any chest pain at rest exertion, dyspnea at rest or with exertion, orthopnea, paroxysmal nocturnal dyspnea, syncope, palpitations, focal neurological deficits, intermittent claudication, lower extremity edema, unexplained weight gain, cough, hemoptysis or wheezing.  He continues to exercise on a daily basis and has improved energy and stamina.  Underlying rhythm is sinus bradycardia at 48 bpm with frequent sinus pauses of 2-4 seconds.  Pacemaker interrogation shows normal device function and anticipated generator longevity of about 10.8 years.  He has 56% atrial pacing (lower rate limit programmed at 50 bpm) and less than 0.1% ventricular pacing.  Heart rate histogram distribution is good.  He had a single 22 beat episode of nonsustained ventricular tachycardia lasting for 4 seconds.  This occurred several months ago and was asymptomatic.   Past Medical History:  Diagnosis Date  . Heart murmur    "an innocent one" (06/10/2016)  . History of blood transfusion ~ 1956   "while I was in  hospital; don't remember why"  . History of kidney stones   . Hyperlipidemia   . Hypertension   . 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 SSS (sick sinus syndrome) (HCC) 06/10/2016  . Type II diabetes mellitus (HCC)     Past Surgical History:  Procedure Laterality Date  . APPENDECTOMY    . CARDIAC CATHETERIZATION  06/10/2016  . CATARACT EXTRACTION W/ INTRAOCULAR LENS IMPLANT Right   . CYSTOSCOPY W/ STONE MANIPULATION    . INSERT / REPLACE / REMOVE PACEMAKER  06/10/2016   dual chamber permanent pacemaker  . KNEE ARTHROSCOPY W/ MENISCECTOMY Bilateral   . LAPAROSCOPIC CHOLECYSTECTOMY    . LEFT HEART CATH AND CORONARY ANGIOGRAPHY N/A 06/10/2016   Procedure: Left Heart Cath and Coronary Angiography;  Surgeon: Dolores PattyBensimhon, Daniel R, MD;  [facility];  Service: Cardiovascular;  Laterality: N/A;  . LITHOTRIPSY    . PACEMAKER IMPLANT N/A 06/10/2016   Procedure: Pacemaker Implant;  Surgeon: Thurmon Fairroitoru, Sueann Brownley, MD;  Location: [facility] INVASIVE CV LAB;  Service: Cardiovascular;  Laterality: N/A;  . TONSILLECTOMY      Current Medications: Current Meds  Medication Sig  . amLODipine (NORVASC) 5 MG tablet Take 1.5 tablets (7.5 mg total) by mouth daily.  Marland Kitchen. aspirin EC 81 MG tablet Take 81 mg by mouth daily.  Marland Kitchen. atorvastatin (LIPITOR) 40 MG tablet TAKE 1 TABLET ONCE DAILY.  Marland Kitchen. Cholecalciferol (VITAMIN D3) 2000 units capsule Take 2,000 Units by mouth daily.  . COMBIGAN 0.2-0.5 % ophthalmic solution Place 1 drop into both eyes 2 (two) times daily.   .Marland Kitchen  ezetimibe (ZETIA) 10 MG tablet TAKE 1 TABLET ONCE DAILY.  Marland Kitchen lisinopril (ZESTRIL) 20 MG tablet TAKE 1 TABLET ONCE DAILY.  . metFORMIN (GLUCOPHAGE) 500 MG tablet Take 500 mg by mouth 2 (two) times daily with a meal.      Allergies:   Patient has no known allergies.   Social History   Socioeconomic History  . Marital status: Married    Spouse name: Not on file  . Number of children: Not on file  . Years of education: Not on file    . Highest education level: Not on file  Occupational History  . Not on file  Tobacco Use  . Smoking status: Former Smoker    Packs/day: 0.50    Years: 25.00    Pack years: 12.50    Types: Cigarettes    Quit date: 1983    Years since quitting: 38.8  . Smokeless tobacco: Never Used  Vaping Use  . Vaping Use: Never used  Substance and Sexual Activity  . Alcohol use: Yes    Comment: 06/10/2016 "might have 2 beers/month"  . Drug use: No  . Sexual activity: Not on file  Other Topics Concern  . Not on file  Social History Narrative  . Not on file   Social Determinants of Health   Financial Resource Strain:   . Difficulty of Paying Living Expenses: Not on file  Food Insecurity:   . Worried About Programme researcher, broadcasting/film/video in the Last Year: Not on file  . Ran Out of Food in the Last Year: Not on file  Transportation Needs:   . Lack of Transportation (Medical): Not on file  . Lack of Transportation (Non-Medical): Not on file  Physical Activity:   . Days of Exercise per Week: Not on file  . Minutes of Exercise per Session: Not on file  Stress:   . Feeling of Stress : Not on file  Social Connections:   . Frequency of Communication with Friends and Family: Not on file  . Frequency of Social Gatherings with Friends and Family: Not on file  . Attends Religious Services: Not on file  . Active Member of Clubs or Organizations: Not on file  . Attends Banker Meetings: Not on file  . Marital Status: Not on file     Family History: The patient's family history includes Colon cancer in his father. ROS:   Please see the history of present illness.    All other systems are reviewed and are negative.   EKGs/Labs/Other Studies Reviewed:    The following studies were reviewed today: Full pacemaker check  May 24, 2018 echocardiogram  1. The left ventricle has normal systolic function, with an ejection fraction of 55-60%. The cavity size was normal. Left ventricular diastolic  parameters were normal.  2. The right ventricle has normal systolic function. The cavity was mildly enlarged. There is no increase in right ventricular wall thickness. Right ventricular systolic pressure is mildly elevated with an estimated pressure of 32.9 mmHg.  3. Left atrial size was mildly dilated.  4. Right atrial size was moderately dilated.  5. The aortic valve is tricuspid. Mild thickening of the aortic valve. Mild calcification of the aortic valve. Mild stenosis of the aortic valve.  6. The aortic root is normal in size and structure.  7. The inferior vena cava was normal in size with <50% respiratory variability.  EKG:  EKG is ordered today.  Shows atrial paced ventricular sensed rhythm and isolated mildly inverted  T waves in lead III and aVF.  Recent Labs: No results found for requested labs within last 8760 hours.  10/09/2019 Hemoglobin 14.1, creatinine 1.3, TSH 3.1, hemoglobin A1c 6.6% Total cholesterol 110, HDL 52, LDL 50, triglycerides 82, APO B 59.  Physical Exam:    VS:  BP 116/67   Pulse (!) 54   Ht 5\' 11"  (1.803 m)   Wt 158 lb 9.6 oz (71.9 kg)   SpO2 100%   BMI 22.12 kg/m     Wt Readings from Last 3 Encounters:  10/23/19 158 lb 9.6 oz (71.9 kg)  04/26/19 163 lb 3.2 oz (74 kg)  12/26/18 164 lb (74.4 kg)     General: Alert, oriented x3, no distress, well-healed left subclavian pacemaker site Head: no evidence of trauma, PERRL, EOMI, no exophtalmos or lid lag, no myxedema, no xanthelasma; normal ears, nose and oropharynx Neck: normal jugular venous pulsations and no hepatojugular reflux; brisk carotid pulses without delay and no carotid bruits Chest: clear to auscultation, no signs of consolidation by percussion or palpation, normal fremitus, symmetrical and full respiratory excursions Cardiovascular: normal position and quality of the apical impulse, regular rhythm, normal first and second heart sounds, 2/6 early peaking systolic ejection murmur, no diastolic  murmurs, rubs or gallops Abdomen: no tenderness or distention, no masses by palpation, no abnormal pulsatility or arterial bruits, normal bowel sounds, no hepatosplenomegaly Extremities: no clubbing, cyanosis or edema; 2+ radial, ulnar and brachial pulses bilaterally; 2+ right femoral, posterior tibial and dorsalis pedis pulses; 2+ left femoral, posterior tibial and dorsalis pedis pulses; no subclavian or femoral bruits Neurological: grossly nonfocal Psych: Normal mood and affect   ASSESSMENT:    1. SSS (sick sinus syndrome) (HCC)   2. Pacemaker   3. Coronary artery disease involving native coronary artery of native heart without angina pectoris   4. Aortic valve stenosis, nonrheumatic   5. Essential hypertension   6. NSVT (nonsustained ventricular tachycardia) (HCC)      PLAN:    In order of problems listed above:  1. SSS: No symptoms of bradycardia clinically and no signs of chronotropic incompetence by device check. 2. Pacemaker: No programming changes necessary today.  Follow-up remote downloads every 3 months. 3. CAD: Asymptomatic despite a very active lifestyle 4. AS: Not yet hemodynamically significant 5. HTN: Excellent control 6. NSVT: Only one episode recorded by his pacemaker, but remarkably long at 22 beats.  Significance uncertain, but at this point additional work-up does not appear to be justified.  Continue to monitor    Medication Adjustments/Labs and Tests Ordered: Current medicines are reviewed at length with the patient today.  Concerns regarding medicines are outlined above. Labs and tests ordered and medication changes are outlined in the patient instructions below:  Patient Instructions  Medication Instructions:  No changes *If you need a refill on your cardiac medications before your next appointment, please call your pharmacy*   Lab Work: None ordered If you have labs (blood work) drawn today and your tests are completely normal, you will receive your  results only by: 12/28/18 MyChart Message (if you have MyChart) OR . A paper copy in the mail If you have any lab test that is abnormal or we need to change your treatment, we will call you to review the results.   Testing/Procedures: None ordered   Follow-Up: At [facility], you and your health needs are our priority.  As part of our continuing mission to provide you with exceptional heart care, we have created  designated Provider Care Teams.  These Care Teams include your primary Cardiologist (physician) and Advanced Practice Providers (APPs -  Physician Assistants and Nurse Practitioners) who all work together to provide you with the care you need, when you need it.  We recommend signing up for the patient portal called "MyChart".  Sign up information is provided on this After Visit Summary.  MyChart is used to connect with patients for Virtual Visits (Telemedicine).  Patients are able to view lab/test results, encounter notes, upcoming appointments, etc.  Non-urgent messages can be sent to your provider as well.   To learn more about what you can do with MyChart, go to [email or web address].    Your next appointment:   12 month(s)  The format for your next appointment:   In Person  Provider:   Thurmon Fair, MD       Signed, Thurmon Fair, MD  10/24/2019 3:23 PM     [facility]

## 2019-10-23 NOTE — Patient Instructions (Signed)

## 2019-10-24 ENCOUNTER — Encounter: Payer: Self-pay | Admitting: Cardiovascular Disease

## 2019-11-07 ENCOUNTER — Other Ambulatory Visit (HOSPITAL_BASED_OUTPATIENT_CLINIC_OR_DEPARTMENT_OTHER): Payer: Self-pay | Admitting: Internal Medicine

## 2019-11-07 ENCOUNTER — Ambulatory Visit: Payer: Medicare Other | Attending: Internal Medicine

## 2019-11-07 DIAGNOSIS — Z23 Encounter for immunization: Secondary | ICD-10-CM

## 2019-11-07 NOTE — Progress Notes (Signed)
   Covid-19 Vaccination Clinic  Name:  DENIS CARREON    MRN: [id number] DOB: 1940/05/25  11/07/2019  Mr. Gales was observed post Covid-19 immunization for 15 minutes without incident. He was provided with Vaccine Information Sheet and instruction to access the V-Safe system. Vaccinated by [employer].  Mr. Agosto was instructed to call 911 with any severe reactions post vaccine: Marland Kitchen Difficulty breathing  . Swelling of face and throat  . A fast heartbeat  . A bad rash all over body  . Dizziness and weakness

## 2019-11-14 MED FILL — PFIZER-BIONTECH COVID-19 VA: 30 | 1 days supply | Qty: 0 | Fill #0

## 2019-12-04 ENCOUNTER — Encounter: Payer: Self-pay | Admitting: Cardiovascular Disease

## 2019-12-04 ENCOUNTER — Ambulatory Visit (INDEPENDENT_AMBULATORY_CARE_PROVIDER_SITE_OTHER): Payer: Medicare Other | Admitting: Cardiovascular Disease

## 2019-12-04 ENCOUNTER — Other Ambulatory Visit: Payer: Self-pay

## 2019-12-04 VITALS — BP 132/66 | HR 60 | Ht 71.0 in | Wt 159.2 lb

## 2019-12-04 DIAGNOSIS — I10 Essential (primary) hypertension: Secondary | ICD-10-CM

## 2019-12-04 DIAGNOSIS — I358 Other nonrheumatic aortic valve disorders: Secondary | ICD-10-CM

## 2019-12-04 DIAGNOSIS — E785 Hyperlipidemia, unspecified: Secondary | ICD-10-CM | POA: Diagnosis not present

## 2019-12-04 DIAGNOSIS — I495 Sick sinus syndrome: Secondary | ICD-10-CM

## 2019-12-04 DIAGNOSIS — I2510 Atherosclerotic heart disease of native coronary artery without angina pectoris: Secondary | ICD-10-CM | POA: Diagnosis not present

## 2019-12-04 DIAGNOSIS — Z950 Presence of cardiac pacemaker: Secondary | ICD-10-CM

## 2019-12-04 NOTE — Patient Instructions (Signed)
Medication Instructions:  No changes *If you need a refill on your cardiac medications before your next appointment, please call your pharmacy*   Lab Work: Not needed If you have labs (blood work) drawn today and your tests are completely normal, you will receive your results only by:  MyChart Message (if you have MyChart) OR  A paper copy in the mail If you have any lab test that is abnormal or we need to change your treatment, we will call you to review the results.   Testing/Procedures:  Not needed  Follow-Up: At [facility], you and your health needs are our priority.  As part of our continuing mission to provide you with exceptional heart care, we have created designated Provider Care Teams.  These Care Teams include your primary Cardiologist (physician) and Advanced Practice Providers (APPs -  Physician Assistants and Nurse Practitioners) who all work together to provide you with the care you need, when you need it.     Your next appointment:   12 month(s)  The format for your next appointment:   In Person  Provider:   Nicki Guadalajara, MD

## 2019-12-04 NOTE — Progress Notes (Signed)
Cardiology Office Note    Date:  12/04/2019   ID:  Travis [name] [birth year], MRN [id number]  PCP:  Travis Pao, MD  Referring M.D.: Dr. Leeroy Cha  Cardiologist:  Shelva Majestic, MD    History of Present Illness:  Travis [name] is a 79 y.o. male who was initially referred through Travis courtesy of [place] for evaluation of lightheadedness and dizziness while running. He presents for a 69-monthfollow-up evaluation.  Travis [name] been active for his entire life.  He has a history of hypertension, hyperlipidemia, and has been diagnosed with type 2 diabetes mellitus for 14 years.  He exercises at least 6 days per week for 1-2 hours.  He does resistance training, spin classes, and typically runs anywhere from 5-6 miles at a 9-10 minute mile pace and has been doing so for many years.  His pulse typically runs in Travis low 30s.  Recently, he has begun to notice that he becomes dizzy when he starts to run.  This is associated with mild shortness of breath.  He denies any chest pain.  Travis symptoms have occurred since January.  He denies any frank syncope.  He denies Travis lightheaded symptoms when he is at rest and his symptoms only occur with significant activity.  When he does spin class he is unaware of any  symptoms.    When I saw for initial evaluation, his ECG revealed marked sinus bradycardia with ventricular rate averaging 37 bpm.  PR interval was 190 ms and QTc interval was 335 ms.  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  He had a 2/6 systolic murmur on physical exam, suggestive of possible aortic valve disease.  I scheduled him for a 2-D echo Doppler study which was done on 06/02/2016.  This showed an ejection fraction at 55-65%.  There was mild LVH.  He did not have any regional wall motion abnormalities.  His aortic valve was reported as structurally normal.  There was a mean gradient of 9 and a  peak gradient of 17.  There was mild MR and mild LA dilation.  His right atrium was mild moderately dilated.  I obtain a magnesium level, which was normal.  I scheduled him for an exercise nuclear stress test to assess both chronotropic competence to exercise as well as myocardial perfusion.  On his exercise portion.  His resting heart rate was 34 bpm, which rose to a maximum of 126/m.  He completed 2 minutes and 2 seconds into stage III of Bruce protocol.  He was able to augment his heart rate appropriately with exercise.  At Travis end of stage I reflective of good aerobic conditioning ventricular rate was 71, at Travis end of stage II 100, and in stage III maximum heart rate was 125.  He was entirely asymptomatic but had an exaggerated blood pressure response opted to 16/75 and at peak stress developed asymptomatic ST segment depression up to 3 mm in Travis inferior and inferolateral leads.  There was a very small mild distal anteroseptal defect which most likely was artifact.  There was no ischemia identified.  He has been wearing an event monitor.  We werereceivingdaily notifications demonstrating marked bradycardia with heart rates in Travis 30s.  These have been sinus rhythm.  He has been entirely asymptomatic.  He had developed sinus pauses up to 5 - 6 seconds.  He remained entirely asymptomatic and specifically denied  any dizziness or lightheadedness or chest pain.  He was unaware of sleep apnea but he does snore.    At  follow-up on 06/04/2016  I had extensive discussion with him, both concerning his ST segment changes on ECG during stress testing as well as his need for a permanent pacemaker.  Due to scheduling difficulties and since he remained asymptomatic, ultimately, both of these procedures were done on 06/10/2016.  Diagnostic catheterization revealed normal LV function.  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  There was mild 30 and 40% smooth  circumflex narrowings.  Later that same day in Travis afternoon he underwent successful insertion of a permanent pacemaker by Dr. 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  He tolerated both procedures well.  Following his pacemaker insertion, he has continued to run and has been without dizziness or lightheadedness. He saw Dr. Sallyanne Kuster on 09/15/2016 for pacemaker follow-up evaluation.  He had normal device function.  He is now scheduled to undergo remote download every 3 months and have yearly office visits with Dr. Sallyanne Kuster. His blood pressure at that time was elevated his blood pressure at that time was elevated He is now scheduled to undergo remote downloads every 3 months  He had normal device function.  And no longer experiences any dizziness or lightheadedness.  He saw Dr. Sallyanne Kuster on September 15, 2016 for pacemaker follow-up evaluation.   His blood pressure that day was elevated and his lisinopril was increased to 20 mg and he continued to be on amlodipine.  He feels his blood pressure has been better.  He has continued to run races and today participated in [place] state games where he finished in second place and qualified for Travis nationals.  In early 2018 had been running approximately 30 miles per week and denied chest pain, PND orthopnea.   I added Zetia to his regimen of atorvastatin.  This is resulted in marked improvement in lipid status and blood work done by his primary physician on March 03, 2017 showed an LDL cholesterol at 31.  He is on lisinopril 20 mg and amlodipine 5 mg for hypertension.  He is on metformin for mild type 2 diabetes mellitus.  Recent hemoglobin A1c was 6.3.  He has had some mild knee discomfort and for this reason has elected not to participate in Travis nationals and running in June.   When I saw him in December 2019 he continued to remain asymptomatic. He still runs but he had  fractured his left kneecap which had limit his is activity for some time. He was unaware of any palpitations. He denies any chest pressure PND orthopnea. He underwent repeat laboratory on December 17, 2017 which showed excellent lipid studies with total cholesterol 119, HDL 67, LDL 43, and triglycerides 45.   I last evaluated him in a telemedicine visit on June 28, 2018 and he continued to feel well. He now sees Dr. Osborne Casco for his primary care.  He continues to be active and is running 4 to 5 miles per day at least 6 days/week.  He denies any chest tightness or pressure.  He denies any lightheadedness.  He denies PND orthopnea.  A pacemaker remote check on June 2 which revealed stable pacemaker function with stable lead measurements and heart rate histogram.  There apparently was 1 event consistent with a 12 beats of  SVT.  Travis [name] remains asymptomatic.   He saw Dr. Sallyanne Kuster in September 2020.  Presently he remains asymptomatic.  He is running 6 days/week for at least 5 miles a day at a 10 to 11 mile pace.  Blood pressure at home has been slightly elevated in Travis 124P systolically.  He denies palpitations.  He denies shortness of breath.  He denies leg swelling.    I  saw him on December 26, 2018.  His blood pressure was elevated and at home had been running in Travis 809 systolically and on repeat by me in Travis office had increased to 168/88.  I recommended titration of amlodipine 7.5 mg daily if blood pressure continues to be increased after 2 weeks of treatment further titration to 10 mg was recommended.  When I saw him in April 2021.  Over Travis prior months he had felt well.  His blood pressure had significantly improved.   He continues to run most days at least 5 miles at a time typically at 11 to 12-minute pace.  He underwent an echo Doppler study in January 2021 which showed an EF of 55 to 60% with grade 1 diastolic dysfunction.  There was mild to moderate aortic valve sclerosis without stenosis.   There was moderate dilation of his left atrium with mild dilation of his right atrium.  When I last saw him he did have a suggestion of a possible squeaky friction rub which was positional.  His pericardium was normal with no effusion.  He has noted some swelling in his ankles.  Since I last saw him, he has continued to be active.   Three weeks ago he ran a half marathon at age 69 year old and finished in first place at a time of 2 hours and 57 minutes.  He continues to have follow-up pacemaker checks remotely had seen Dr. Sallyanne Kuster on October 23, 2019.  Pacemaker interrogation demonstrated normal device function with anticipated generator longevity of about 10.8 years.  He denies chest pain or shortness of breath.  He had had laboratory done by Dr. Osborne Casco.  Labs were stable although initial PSA was mildly increased at 4.8 which he states on follow-up had improved.  He continues to be on triple drug therapy with blood pressure control.  Amlodipine 5 mg, hydrochlorothiazide 12.5 mg, lisinopril 20 mg.  He is on atorvastatin 40 mg and Zetia 10 mg for hyperlipidemia.  He continues to be on Metformin 500 mg twice a day for diabetes mellitus.  He presents for evaluation.  Past Medical History:  Diagnosis Date  . Heart murmur    "an innocent one" (06/10/2016)  . History of blood transfusion ~ 1956   "while I was in hospital; don't remember why"  . History of kidney stones   . Hyperlipidemia   . Hypertension   . 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 SSS (sick sinus syndrome) ([place]) 06/10/2016  . Type II diabetes mellitus ([place])     Past Surgical History:  Procedure Laterality Date  . APPENDECTOMY    . CARDIAC CATHETERIZATION  06/10/2016  . CATARACT EXTRACTION W/ INTRAOCULAR LENS IMPLANT Right   . CYSTOSCOPY W/ STONE MANIPULATION    . INSERT / REPLACE / REMOVE PACEMAKER  06/10/2016   dual chamber permanent pacemaker  . KNEE ARTHROSCOPY W/ MENISCECTOMY Bilateral   . LAPAROSCOPIC  CHOLECYSTECTOMY    . LEFT HEART CATH AND CORONARY ANGIOGRAPHY N/A 06/10/2016   Procedure: Left Heart Cath and Coronary Angiography;  Surgeon: Glori Bickers  R, MD;  Location: [facility];  Service: Cardiovascular;  Laterality: N/A;  . LITHOTRIPSY    . PACEMAKER IMPLANT N/A 06/10/2016   Procedure: Pacemaker Implant;  Surgeon: Sanda Klein, MD;  Location: [facility];  Service: Cardiovascular;  Laterality: N/A;  . TONSILLECTOMY      Current Medications: Outpatient Medications Prior to Visit  Medication Sig Dispense Refill  . amLODipine (NORVASC) 5 MG tablet Take 1.5 tablets (7.5 mg total) by mouth daily. 90 tablet 3  . aspirin EC 81 MG tablet Take 81 mg by mouth daily.    Marland Kitchen atorvastatin (LIPITOR) 40 MG tablet TAKE 1 TABLET ONCE DAILY. 90 tablet 3  . Cholecalciferol (VITAMIN D3) 2000 units capsule Take 2,000 Units by mouth daily.    . COMBIGAN 0.2-0.5 % ophthalmic solution Place 1 drop into both eyes 2 (two) times daily.     Marland Kitchen ezetimibe (ZETIA) 10 MG tablet TAKE 1 TABLET ONCE DAILY. 90 tablet 3  . hydrochlorothiazide (MICROZIDE) 12.5 MG capsule Take 1 capsule (12.5 mg total) by mouth daily. 90 capsule 3  . lisinopril (ZESTRIL) 20 MG tablet TAKE 1 TABLET ONCE DAILY. 90 tablet 4  . metFORMIN (GLUCOPHAGE) 500 MG tablet Take 500 mg by mouth 2 (two) times daily with a meal.      No facility-administered medications prior to visit.     Allergies:   [name] has no known allergies.   Social History   Socioeconomic History  . Marital status: Married    Spouse name: Not on file  . Number of children: Not on file  . Years of education: Not on file  . Highest education level: Not on file  Occupational History  . Not on file  Tobacco Use  . Smoking status: Former Smoker    Packs/day: 0.50    Years: 25.00    Pack years: 12.50    Types: Cigarettes    Quit date: 1983    Years since quitting: 38.9  . Smokeless tobacco: Never Used  Vaping Use  . Vaping Use: Never used    Substance and Sexual Activity  . Alcohol use: Yes    Comment: 06/10/2016 "might have 2 beers/month"  . Drug use: No  . Sexual activity: Not on file  Other Topics Concern  . Not on file  Social History Narrative  . Not on file   Social Determinants of Health   Financial Resource Strain:   . Difficulty of Paying Living Expenses: Not on file  Food Insecurity:   . Worried About Charity fundraiser in [place] Last Year: Not on file  . Ran Out of Food in [place] Last Year: Not on file  Transportation Needs:   . Lack of Transportation (Medical): Not on file  . Lack of Transportation (Non-Medical): Not on file  Physical Activity:   . Days of Exercise per Week: Not on file  . Minutes of Exercise per Session: Not on file  Stress:   . Feeling of Stress : Not on file  Social Connections:   . Frequency of Communication with Friends and Family: Not on file  . Frequency of Social Gatherings with Friends and Family: Not on file  . Attends Religious Services: Not on file  . Active Member of Clubs or Organizations: Not on file  . Attends Archivist Meetings: Not on file  . Marital Status: Not on file    Social history is notable in that he was born in [place].  He's been married  for over 55 years.  He has 2 children, 7 grandchildren, and one great-grandchild.  He has a Scientist, water quality in Theatre stage manager.  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  He is retired.  He drinks occasional beer.  He does not smoke.   Family History:  Travis [name]'s family history includes Colon cancer in his father..  He has a brother age 42 and a sister age 6, who are healthy.   ROS General: Negative; No fevers, chills, or night sweats;  HEENT: Negative; No changes in vision or hearing, sinus congestion, difficulty swallowing Pulmonary: Negative; No cough, wheezing, shortness of breath, hemoptysis Cardiovascular:  See HPI GI: Negative; No nausea, vomiting, diarrhea, or abdominal  pain GU: Negative; No dysuria, hematuria, or difficulty voiding Musculoskeletal: Left knee discomfort Hematologic/Oncology: Negative; no easy bruising, bleeding Endocrine: Negative; no heat/cold intolerance; no diabetes Neuro: Negative; no changes in balance, headaches Skin: Negative; No rashes or skin lesions Psychiatric: Negative; No behavioral problems, depression Sleep: Positive for snoring, no daytime sleepiness, hypersomnolence, bruxism, restless legs, hypnogognic hallucinations, no cataplexy Other comprehensive 14 point system review is negative.   PHYSICAL EXAM:   VS:  BP 132/66   Pulse 60   Ht _0  (1.803 m)   Wt 159 lb 3.2 oz (72.2 kg)   BMI 22.20 kg/m     Repeat blood pressure by me was 134/66  Wt Readings from Last 3 Encounters:  12/04/19 159 lb 3.2 oz (72.2 kg)  10/23/19 158 lb 9.6 oz (71.9 kg)  04/26/19 163 lb 3.2 oz (74 kg)    General: Alert, oriented, no distress.  Skin: normal turgor, no rashes, warm and dry HEENT: Normocephalic, atraumatic. Pupils equal round and reactive to light; sclera anicteric; extraocular muscles intact;  Nose without nasal septal hypertrophy Mouth/Parynx benign; Mallinpatti scale 2 Neck: No JVD, no carotid bruits; normal carotid upstroke Lungs: clear to ausculatation and percussion; no wheezing or rales Chest wall: without tenderness to palpitation Heart: PMI not displaced, RRR, s1 s2 normal, 1/6 systolic murmur, no diastolic murmur, no rubs, gallops, thrills, or heaves Abdomen: soft, nontender; no hepatosplenomehaly, BS+; abdominal aorta nontender and not dilated by palpation. Back: no CVA tenderness Pulses 2+ Musculoskeletal: full range of motion, normal strength, no joint deformities Extremities: no clubbing cyanosis or edema, Homan's sign negative  Neurologic: grossly nonfocal; Cranial nerves grossly wnl Psychologic: Normal mood and affect   Studies/Labs Reviewed:   ECG (independently read by me): Atrially paced rhythm at  60 bpm with prolonged AV conduction with a PR interval of 234 ms.  No ectopy.  April 2021 ECG (independently read by me): Atrially paced at 55 bpm.  PR interval 218 ms.  No ectopy.  December 2020 ECG (independently read by me): Atrially paced rhythm at 69 bpm with prolonged AV conduction, PR interval 222 ms.  LVH with repolarization changes.  December 2019 ECG (independently read by me): Atrially paced rhythm with PVCs and transient ventricular bigeminy.  Prolonged AV conduction with PR interval 230 ms.  May 2019 ECG (independently read by me): Atrial paced rhythm at 51 bpm.  Prolonged AV conduction with a PR interval of 242 ms.  QTc interval 383 ms.  November 24, 2016 EKG:  EKG is ordered today.  A paced rhythm at 51 bpm.  PR interval 222 ms.  Mild LVH.  QTc interval 372 ms  06/16/2016 ECG (independently read by me): Atrially paced rhythm at 50 bpm with prolonged AV conduction with a PR interval  at 214 ms.  06/04/2016 ECG (independently read by me): Marked sinus bradycardia with possible low atrial involvement.  PR interval 166 ms, QTc interval 335 ms.  Ventricular rate 36.  05/20/2016 ECG (independently read by me): Marked sinus bradycardia with sinus arrhythmia, ventricular rate in Travis 40s to upper 30s, average 37.  PR interval 190 ms.  QTc interval 335 ms.  No ST segment changes.  Recent Labs: BMP Latest Ref Rng & Units 12/17/2017 03/03/2017 11/27/2016  Glucose 65 - 99 mg/dL 135(H) 119(H) 108(H)  BUN 8 - 27 mg/dL _0 Creatinine 0.76 - 1.27 mg/dL 1.01 1.13 1.12  BUN/Creat Ratio 10 - _1 Sodium 134 - 144 mmol/L 141 140 138  Potassium 3.5 - 5.2 mmol/L 4.5 4.7 4.2  Chloride 96 - 106 mmol/L 102 106 98  CO2 20 - 29 mmol/L _2 Calcium 8.6 - 10.2 mg/dL 10.2 10.2 10.2     Hepatic Function Latest Ref Rng & Units 12/17/2017 03/03/2017 11/17/2016  Total Protein 6.0 - 8.5 g/dL 6.5 6.4 7.1  Albumin 3.5 - 4.8 g/dL 4.6 4.5 4.8  AST 0 - 40 IU/L _3 ALT 0 - 44 IU/L _4 Alk Phosphatase 39 - 117 IU/L 77 62 82  Total Bilirubin 0.0 - 1.2 mg/dL 1.1 1.2 1.3(H)    CBC Latest Ref Rng & Units 06/04/2016  WBC 3.4 - 10.8 x10E3/uL 7.1  Hemoglobin 13.0 - 17.7 g/dL 14.2  Hematocrit 37.5 - 51.0 % 41.9  Platelets 150 - 379 x10E3/uL 260   Lab Results  Component Value Date   MCV 96 06/04/2016   No results found for: TSH Lab Results  Component Value Date   HGBA1C 7.7 (H) 12/17/2017     BNP No results found for: BNP  ProBNP No results found for: PROBNP   Lipid Panel     Component Value Date/Time   CHOL 119 12/17/2017 0839   TRIG 45 12/17/2017 0839   HDL 67 12/17/2017 0839   CHOLHDL 1.8 12/17/2017 0839   LDLCALC 43 12/17/2017 0839     RADIOLOGY: No results found.   Additional studies/ records that were reviewed today include:  I reviewed Travis recent records from [facility]. I have reviewed his 2-D echo Doppler study, exercise Myoview study, laboratory, as well as daily  Monitors with critical notification from Travis event monitor prior to Travis insertion of Travis pacemaker.  I personally reviewed Travis catheterization findings, as well as his pacemaker implantation report.  Subsequent office visits with Dr. Sallyanne Kuster have been reviewed as well as recent laboratory.   ASSESSMENT:    1. Hyperlipidemia with target LDL less than 70   2. Coronary artery disease involving native coronary artery of native heart without angina pectoris   3. Essential hypertension   4. SSS (sick sinus syndrome) ([place])   5. Pacemaker   6. Aortic valve sclerosis     PLAN:  Travis [name] is a very pleasant, young appearing active 79 year-old gentleman who has a long-standing history of significant exercise and has been exercising for at least 1-2 hours per day 6 days per week.  When I initially saw him he was running 5 to 6 miles at a time and was doing spin class.  He underwent permanent pacemaker for severe sinus bradycardia, periods of junctional escape and sinus pauses up to  6 seconds in duration on an event monitor.  Prior to his pacemaker cardiac catheterization revealed  a 60 to 70% calcified LAD stenosis with normal LV function.  Subsequently, he has continued to be asymptomatic.  We have been very aggressive with lipid-lowering therapy and LDL cholesterol have been running in Travis 40s.  Laboratory from October 06, 2018 showed  a total cholesterol of 103, HDL 52, LDL 42, and triglycerides 46 on his regimen consisting of atorvastatin 40 mg and Zetia 10 mg.  He recently had subsequent blood work in September 2021 and was told that his labs were very good with Travis exception of mild PSA elevation and mild hemoglobin A1c elevation.  His blood pressure today is stable on his antihypertensive medication including amlodipine 7.5 mg, HCTZ 12.5 mg and lisinopril 20 mg.  He continues to be on combination atorvastatin 40 mg and Zetia 10 mg for hyperlipidemia.  He is undergoing 61-monthinterval pacemaker checks and has normal device function.  He is on Metformin 500 mg twice a day for diabetes.  Hemoglobin A1c in April 2021 was 6.9.  He continues to exercise on a regular basis and competitively runs.  His last echo Doppler study in January 2021 showed mild to moderate aortic valve sclerosis without stenosis.  He sees Dr. TRosana Hoesin 635-monthntervals will be seeing Dr. CrSallyanne Kusterext year.  As long as he is stable I will see him in 1 year for reevaluation or sooner if problems arise.   Medication Adjustments/Labs and Tests Ordered: Current medicines are reviewed at length with Travis [name] today.  Concerns regarding medicines are outlined above.  Medication changes, Labs and Tests ordered today are listed in Travis [name] Instructions below. [name] Instructions  Medication Instructions:  No changes *If you need a refill on your cardiac medications before your next appointment, please call your pharmacy*   Lab Work: Not needed If you have labs (blood work) drawn today and your tests are  completely normal, you will receive your results only by: . Marland KitchenyChart Message (if you have MyChart) OR . A paper copy in Travis mail If you have any lab test that is abnormal or we need to change your treatment, we will call you to review Travis results.   Testing/Procedures:  Not needed  Follow-Up: [facility] are our [facility].  As part of our continuing mission to provide you with exceptional heart care, we have created designated Provider Care Teams.  These Care Teams include your primary Cardiologist (physician) and Advanced Practice Providers (APPs -  Physician Assistants and Nurse Practitioners) who all work together to provide you with Travis care you need, when you need it.     Your next appointment:   12 month(s)  Travis format for your next appointment:   In Person  Provider:   ThShelva MajesticMD      Signed, ThShelva MajesticMD  12/04/2019 8:26 AM    [place]  [id number]: [phone]

## 2019-12-18 ENCOUNTER — Encounter: Payer: Self-pay | Admitting: Podiatry

## 2019-12-18 ENCOUNTER — Ambulatory Visit (INDEPENDENT_AMBULATORY_CARE_PROVIDER_SITE_OTHER): Payer: Medicare Other | Admitting: Podiatry

## 2019-12-18 ENCOUNTER — Other Ambulatory Visit: Payer: Self-pay

## 2019-12-18 ENCOUNTER — Ambulatory Visit (INDEPENDENT_AMBULATORY_CARE_PROVIDER_SITE_OTHER): Payer: Medicare Other

## 2019-12-18 DIAGNOSIS — M79675 Pain in left toe(s): Secondary | ICD-10-CM | POA: Diagnosis not present

## 2019-12-18 DIAGNOSIS — M79674 Pain in right toe(s): Secondary | ICD-10-CM | POA: Diagnosis not present

## 2019-12-18 DIAGNOSIS — B351 Tinea unguium: Secondary | ICD-10-CM | POA: Diagnosis not present

## 2019-12-18 DIAGNOSIS — I495 Sick sinus syndrome: Secondary | ICD-10-CM | POA: Diagnosis not present

## 2019-12-18 DIAGNOSIS — E119 Type 2 diabetes mellitus without complications: Secondary | ICD-10-CM

## 2019-12-20 LAB — CUP PACEART REMOTE DEVICE CHECK
Battery Remaining Longevity: 127 mo
Battery Voltage: 3.01 V
Brady Statistic AP VP Percent: 0.09 %
Brady Statistic AP VS Percent: 64.82 %
Brady Statistic AS VP Percent: 0.03 %
Brady Statistic AS VS Percent: 35.06 %
Brady Statistic RA Percent Paced: 65.51 %
Brady Statistic RV Percent Paced: 0.12 %
Date Time Interrogation Session: 20211206032845
Implantable Lead Implant Date: 20180530
Implantable Lead Implant Date: 20180530
Implantable Lead Location: 753859
Implantable Lead Location: 753860
Implantable Lead Model: 5076
Implantable Lead Model: 5076
Implantable Pulse Generator Implant Date: 20180530
Lead Channel Impedance Value: 304 Ohm
Lead Channel Impedance Value: 342 Ohm
Lead Channel Impedance Value: 361 Ohm
Lead Channel Impedance Value: 418 Ohm
Lead Channel Pacing Threshold Amplitude: 0.5 V
Lead Channel Pacing Threshold Amplitude: 0.75 V
Lead Channel Pacing Threshold Pulse Width: 0.4 ms
Lead Channel Pacing Threshold Pulse Width: 0.4 ms
Lead Channel Sensing Intrinsic Amplitude: 16.125 mV
Lead Channel Sensing Intrinsic Amplitude: 16.125 mV
Lead Channel Sensing Intrinsic Amplitude: 4 mV
Lead Channel Sensing Intrinsic Amplitude: 4 mV
Lead Channel Setting Pacing Amplitude: 1.5 V
Lead Channel Setting Pacing Amplitude: 2 V
Lead Channel Setting Pacing Pulse Width: 0.4 ms
Lead Channel Setting Sensing Sensitivity: 2 mV

## 2019-12-24 NOTE — Progress Notes (Signed)
Subjective: Travis [name] is a 79 y.o. male patient seen today preventative diabetic foot care and painful mycotic nails b/l that are difficult to trim. Pain interferes with ambulation. Aggravating factors include wearing enclosed shoe gear. Pain is relieved with periodic professional debridement.  Travis [name] continues his daily exercise regimen. He voices no new pedal problems on today's visit.  PCP is Dr. Guerry Bruin.  Patient Active Problem List   Diagnosis Date Noted  . Left patella fracture 12/16/2017  . CAD (coronary artery disease), native coronary artery 09/14/2016  . Pacemaker 06/10/2016  . Paroxysmal atrial fibrillation (HCC) 06/07/2016  . Sinus pause 06/07/2016  . Bradycardia 06/04/2016  . Groin pain 06/29/2014  . Piriformis syndrome of left side 10/30/2013    Current Outpatient Medications on File Prior to Visit  Medication Sig Dispense Refill  . amLODipine (NORVASC) 5 MG tablet Take 1.5 tablets (7.5 mg total) by mouth daily. 90 tablet 3  . aspirin EC 81 MG tablet Take 81 mg by mouth daily.    Marland Kitchen atorvastatin (LIPITOR) 40 MG tablet TAKE 1 TABLET ONCE DAILY. 90 tablet 3  . Cholecalciferol (VITAMIN D3) 2000 units capsule Take 2,000 Units by mouth daily.    . COMBIGAN 0.2-0.5 % ophthalmic solution Place 1 drop into both eyes 2 (two) times daily.     Marland Kitchen ezetimibe (ZETIA) 10 MG tablet TAKE 1 TABLET ONCE DAILY. 90 tablet 3  . hydrochlorothiazide (MICROZIDE) 12.5 MG capsule Take 1 capsule (12.5 mg total) by mouth daily. 90 capsule 3  . lisinopril (ZESTRIL) 20 MG tablet TAKE 1 TABLET ONCE DAILY. 90 tablet 4  . metFORMIN (GLUCOPHAGE) 500 MG tablet Take 500 mg by mouth 2 (two) times daily with a meal.      No current facility-administered medications on file prior to visit.    No Known Allergies  Objective: Physical Exam  General: Travis [name] is a pleasant 79 y.o. Caucasian male, in NAD. AAO x 3.   Vascular:  Neurovascular status unchanged b/l LE.. Capillary fill time  to digits <3 seconds b/l LE. Palpable DP pulses b/l. Palpable PT pulses b/l. Pedal hair present b/l. Skin temperature gradient within normal limits b/l. No edema noted b/l.  Dermatological:  Pedal skin with normal turgor, texture and tone bilaterally. No open wounds bilaterally. No interdigital macerations bilaterally. Toenails 1-5 b/l elongated, discolored, dystrophic, thickened, crumbly with subungual debris and tenderness to dorsal palpation. No hyperkeratotic nor porokeratotic lesions present on today's visit.  Musculoskeletal:  Normal muscle strength 5/5 to all lower extremity muscle groups bilaterally. No pain crepitus or joint limitation noted with ROM b/l. No gross bony deformities bilaterally.  Neurological:  Protective sensation intact 5/5 intact bilaterally with 10g monofilament b/l. Vibratory sensation intact b/l. Proprioception intact bilaterally.  Assessment and Plan:  1. Pain due to onychomycosis of toenails of both feet   2. Controlled type 2 diabetes mellitus without complication, without long-term current use of insulin (HCC)    -Examined patient. -Toenails 1-5 b/l were debrided in length and girth with sterile nail nippers and dremel without iatrogenic bleeding.  -Patient to report any pedal injuries to medical professional immediately. -Patient/POA to call should there be question/concern in the interim.  Return in about 3 months (around 03/17/2020).  Freddie Breech, DPM

## 2019-12-28 NOTE — Progress Notes (Signed)
Remote pacemaker transmission.   

## 2020-01-01 ENCOUNTER — Other Ambulatory Visit: Payer: Self-pay | Admitting: Cardiovascular Disease

## 2020-01-02 ENCOUNTER — Other Ambulatory Visit: Payer: Self-pay | Admitting: Cardiovascular Disease

## 2020-01-04 ENCOUNTER — Other Ambulatory Visit: Payer: Self-pay | Admitting: Cardiovascular Disease

## 2020-01-08 ENCOUNTER — Other Ambulatory Visit: Payer: Self-pay | Admitting: Cardiovascular Disease

## 2020-01-14 ENCOUNTER — Other Ambulatory Visit: Payer: Self-pay | Admitting: Cardiovascular Disease

## 2020-03-18 ENCOUNTER — Other Ambulatory Visit: Payer: Self-pay

## 2020-03-18 ENCOUNTER — Ambulatory Visit (INDEPENDENT_AMBULATORY_CARE_PROVIDER_SITE_OTHER): Payer: Medicare Other

## 2020-03-18 ENCOUNTER — Encounter: Payer: Self-pay | Admitting: Podiatry

## 2020-03-18 ENCOUNTER — Ambulatory Visit (INDEPENDENT_AMBULATORY_CARE_PROVIDER_SITE_OTHER): Payer: Medicare Other | Admitting: Podiatry

## 2020-03-18 DIAGNOSIS — M79675 Pain in left toe(s): Secondary | ICD-10-CM

## 2020-03-18 DIAGNOSIS — L84 Corns and callosities: Secondary | ICD-10-CM

## 2020-03-18 DIAGNOSIS — E119 Type 2 diabetes mellitus without complications: Secondary | ICD-10-CM | POA: Diagnosis not present

## 2020-03-18 DIAGNOSIS — B351 Tinea unguium: Secondary | ICD-10-CM | POA: Diagnosis not present

## 2020-03-18 DIAGNOSIS — I495 Sick sinus syndrome: Secondary | ICD-10-CM

## 2020-03-18 DIAGNOSIS — M79674 Pain in right toe(s): Secondary | ICD-10-CM

## 2020-03-20 LAB — CUP PACEART REMOTE DEVICE CHECK
Battery Remaining Longevity: 123 mo
Battery Voltage: 3.01 V
Brady Statistic AP VP Percent: 0.05 %
Brady Statistic AP VS Percent: 57.3 %
Brady Statistic AS VP Percent: 0.03 %
Brady Statistic AS VS Percent: 42.62 %
Brady Statistic RA Percent Paced: 57.8 %
Brady Statistic RV Percent Paced: 0.08 %
Date Time Interrogation Session: 20220307032116
Implantable Lead Implant Date: 20180530
Implantable Lead Implant Date: 20180530
Implantable Lead Location: 753859
Implantable Lead Location: 753860
Implantable Lead Model: 5076
Implantable Lead Model: 5076
Implantable Pulse Generator Implant Date: 20180530
Lead Channel Impedance Value: 285 Ohm
Lead Channel Impedance Value: 323 Ohm
Lead Channel Impedance Value: 361 Ohm
Lead Channel Impedance Value: 418 Ohm
Lead Channel Pacing Threshold Amplitude: 0.5 V
Lead Channel Pacing Threshold Amplitude: 0.625 V
Lead Channel Pacing Threshold Pulse Width: 0.4 ms
Lead Channel Pacing Threshold Pulse Width: 0.4 ms
Lead Channel Sensing Intrinsic Amplitude: 15.125 mV
Lead Channel Sensing Intrinsic Amplitude: 15.125 mV
Lead Channel Sensing Intrinsic Amplitude: 3.625 mV
Lead Channel Sensing Intrinsic Amplitude: 3.625 mV
Lead Channel Setting Pacing Amplitude: 1.5 V
Lead Channel Setting Pacing Amplitude: 2 V
Lead Channel Setting Pacing Pulse Width: 0.4 ms
Lead Channel Setting Sensing Sensitivity: 2 mV

## 2020-03-24 NOTE — Progress Notes (Signed)
Subjective: Travis [name] is a 80 y.o. male patient seen today preventative diabetic foot care and painful mycotic nails b/l that are difficult to trim. Pain interferes with ambulation. Aggravating factors include wearing enclosed shoe gear. Pain is relieved with periodic professional debridement.  Travis [name] continues to run several time/week. He voices no new pedal problems on today's visit.  PCP is Dr. Guerry Bruin.Last visit was 12/18/2019.  No Known Allergies  Objective: Physical Exam  General: Travis [name] is a pleasant 80 y.o. Caucasian male, in NAD. AAO x 3.   Vascular:  Neurovascular status unchanged b/l LE.. Capillary fill time to digits <3 seconds b/l LE. Palpable DP pulses b/l. Palpable PT pulses b/l. Pedal hair present b/l. Skin temperature gradient within normal limits b/l. No edema noted b/l.  Dermatological:  Pedal skin with normal turgor, texture and tone bilaterally. No open wounds bilaterally. No interdigital macerations bilaterally. Toenails 1-5 b/l elongated, discolored, dystrophic, thickened, crumbly with subungual debris and tenderness to dorsal palpation. Porokeratotic lesion(s) submet head 3 left foot and submet head 5 right foot. No erythema, no edema, no drainage, no fluctuance.  Musculoskeletal:  Normal muscle strength 5/5 to all lower extremity muscle groups bilaterally. No pain crepitus or joint limitation noted with ROM b/l. No gross bony deformities bilaterally.  Neurological:  Protective sensation intact 5/5 intact bilaterally with 10g monofilament b/l. Vibratory sensation intact b/l. Proprioception intact bilaterally.  Assessment and Plan:  1. Pain due to onychomycosis of toenails of both feet   2. Callus   3. Controlled type 2 diabetes mellitus without complication, without long-term current use of insulin (HCC)    -Examined patient. -Continue diabetic foot care principles. -Patient to continue soft, supportive shoe gear daily. -Toenails 1-5 b/l  were debrided in length and girth with sterile nail nippers and dremel without iatrogenic bleeding.  -Painful porokeratotic lesion(s) submet head 3 left foot and submet head 5 right foot pared and enucleated with sterile scalpel blade without incident. -Patient to report any pedal injuries to medical professional immediately. -Patient/POA to call should there be question/concern in the interim.  Return in about 3 months (around 06/18/2020).  Travis [name], DPM

## 2020-03-27 NOTE — Progress Notes (Signed)
Remote pacemaker transmission.   

## 2020-03-28 ENCOUNTER — Other Ambulatory Visit: Payer: Self-pay | Admitting: Cardiovascular Disease

## 2020-04-10 ENCOUNTER — Other Ambulatory Visit: Payer: Self-pay | Admitting: Cardiovascular Disease

## 2020-05-18 ENCOUNTER — Other Ambulatory Visit: Payer: Self-pay | Admitting: Cardiovascular Disease

## 2020-05-23 ENCOUNTER — Ambulatory Visit: Payer: Medicare Other | Attending: Internal Medicine

## 2020-05-23 DIAGNOSIS — Z23 Encounter for immunization: Secondary | ICD-10-CM

## 2020-05-23 NOTE — Progress Notes (Signed)
   Covid-19 Vaccination Clinic  Name:  DHILLON COMUNALE    MRN: [id number] DOB: 23-Apr-1940  05/23/2020  Mr. Saxer was observed post Covid-19 immunization for 15 minutes without incident. He was provided with Vaccine Information Sheet and instruction to access the V-Safe system.   Mr. Nancarrow was instructed to call 911 with any severe reactions post vaccine: Marland Kitchen Difficulty breathing  . Swelling of face and throat  . A fast heartbeat  . A bad rash all over body  . Dizziness and weakness   Immunizations Administered    Name Date Dose VIS Date Route   PFIZER Comrnaty(Gray TOP) Covid-19 Vaccine 05/23/2020 10:58 AM 0.3 mL 12/21/2019 Intramuscular   Manufacturer: [employer], [employer]   Lot: [id number]   NDC: [phone]

## 2020-05-27 ENCOUNTER — Other Ambulatory Visit (HOSPITAL_BASED_OUTPATIENT_CLINIC_OR_DEPARTMENT_OTHER): Payer: Self-pay

## 2020-05-27 MED ORDER — PFIZER-BIONT COVID-19 VAC-TRIS 30 MCG/0.3ML IM SUSP
INTRAMUSCULAR | 0 refills | Status: DC
  Filled 2020-05-27: qty 0.3, 1d supply, fill #0

## 2020-06-06 ENCOUNTER — Other Ambulatory Visit (HOSPITAL_COMMUNITY): Payer: Self-pay | Admitting: Physician Assistant

## 2020-06-06 DIAGNOSIS — M25562 Pain in left knee: Secondary | ICD-10-CM

## 2020-06-10 ENCOUNTER — Other Ambulatory Visit: Payer: Self-pay | Admitting: Cardiovascular Disease

## 2020-06-17 ENCOUNTER — Ambulatory Visit (INDEPENDENT_AMBULATORY_CARE_PROVIDER_SITE_OTHER): Payer: Medicare Other

## 2020-06-17 DIAGNOSIS — I495 Sick sinus syndrome: Secondary | ICD-10-CM

## 2020-06-17 LAB — CUP PACEART REMOTE DEVICE CHECK
Battery Remaining Longevity: 121 mo
Battery Voltage: 3.01 V
Brady Statistic AP VP Percent: 0.03 %
Brady Statistic AP VS Percent: 63.53 %
Brady Statistic AS VP Percent: 0.02 %
Brady Statistic AS VS Percent: 36.42 %
Brady Statistic RA Percent Paced: 64.1 %
Brady Statistic RV Percent Paced: 0.05 %
Date Time Interrogation Session: 20220605223505
Implantable Lead Implant Date: 20180530
Implantable Lead Implant Date: 20180530
Implantable Lead Location: 753859
Implantable Lead Location: 753860
Implantable Lead Model: 5076
Implantable Lead Model: 5076
Implantable Pulse Generator Implant Date: 20180530
Lead Channel Impedance Value: 285 Ohm
Lead Channel Impedance Value: 342 Ohm
Lead Channel Impedance Value: 361 Ohm
Lead Channel Impedance Value: 418 Ohm
Lead Channel Pacing Threshold Amplitude: 0.5 V
Lead Channel Pacing Threshold Amplitude: 0.625 V
Lead Channel Pacing Threshold Pulse Width: 0.4 ms
Lead Channel Pacing Threshold Pulse Width: 0.4 ms
Lead Channel Sensing Intrinsic Amplitude: 12.625 mV
Lead Channel Sensing Intrinsic Amplitude: 12.625 mV
Lead Channel Sensing Intrinsic Amplitude: 3.625 mV
Lead Channel Sensing Intrinsic Amplitude: 3.625 mV
Lead Channel Setting Pacing Amplitude: 1.5 V
Lead Channel Setting Pacing Amplitude: 2 V
Lead Channel Setting Pacing Pulse Width: 0.4 ms
Lead Channel Setting Sensing Sensitivity: 2 mV

## 2020-06-25 ENCOUNTER — Encounter: Payer: Self-pay | Admitting: Podiatry

## 2020-06-25 ENCOUNTER — Ambulatory Visit (INDEPENDENT_AMBULATORY_CARE_PROVIDER_SITE_OTHER): Payer: Medicare Other | Admitting: Podiatry

## 2020-06-25 ENCOUNTER — Other Ambulatory Visit: Payer: Self-pay

## 2020-06-25 DIAGNOSIS — L84 Corns and callosities: Secondary | ICD-10-CM

## 2020-06-25 DIAGNOSIS — E118 Type 2 diabetes mellitus with unspecified complications: Secondary | ICD-10-CM | POA: Diagnosis not present

## 2020-06-25 DIAGNOSIS — B351 Tinea unguium: Secondary | ICD-10-CM

## 2020-06-25 DIAGNOSIS — M79674 Pain in right toe(s): Secondary | ICD-10-CM | POA: Diagnosis not present

## 2020-06-25 DIAGNOSIS — M79675 Pain in left toe(s): Secondary | ICD-10-CM

## 2020-07-01 ENCOUNTER — Other Ambulatory Visit: Payer: Self-pay | Admitting: Cardiovascular Disease

## 2020-07-01 NOTE — Progress Notes (Signed)
Subjective: Travis [name] is a pleasant 80 y.o. male patient seen today painful thick toenails that are difficult to trim. Pain interferes with ambulation. Aggravating factors include wearing enclosed shoe gear. Pain is relieved with periodic professional debridement.  He states his blood glucose was 107 mg/dl today. He is excited to be going to the [employer] with his family for Father's Day.  PCP is Tisovec, Adelfa Koh, MD. Last visit was: February, 2022.  No Known Allergies  Objective: Physical Exam  General: Travis [name] is a pleasant 80 y.o. Caucasian male, WD, WN in NAD. AAO x 3.   Vascular:  Capillary fill time to digits <3 seconds b/l lower extremities. Palpable pedal pulses b/l LE. Pedal hair present. Lower extremity skin temperature gradient within normal limits. No pain with calf compression b/l.  Dermatological:  Pedal skin with normal turgor, texture and tone bilaterally. No open wounds bilaterally. No interdigital macerations bilaterally. Toenails 1-5 b/l elongated, discolored, dystrophic, thickened, crumbly with subungual debris and tenderness to dorsal palpation. Porokeratotic lesion(s) submet head 3 left foot and submet head 5 right foot. No erythema, no edema, no drainage, no fluctuance.  Musculoskeletal:  Normal muscle strength 5/5 to all lower extremity muscle groups bilaterally. No pain crepitus or joint limitation noted with ROM b/l. No gross bony deformities bilaterally.  Neurological:  Protective sensation intact 5/5 intact bilaterally with 10g monofilament b/l. Vibratory sensation intact b/l.  Assessment and Plan:  1. Pain due to onychomycosis of toenails of both feet   2. Callus   3. Type 2 diabetes mellitus with complication, without long-term current use of insulin (HCC)      -Examined patient. -Continue diabetic foot care principles. -Patient to continue soft, supportive shoe gear daily. -Toenails 1-5 b/l were debrided in length and girth with  sterile nail nippers and dremel without iatrogenic bleeding.  -Painful porokeratotic lesion(s) submet head 3 left foot and submet head 5 right foot pared and enucleated with sterile scalpel blade without incident. Total number of lesions debrided=2. -Patient to report any pedal injuries to medical professional immediately. -Patient/POA to call should there be question/concern in the interim.  Return in about 3 months (around 09/25/2020).  Freddie Breech, DPM

## 2020-07-02 ENCOUNTER — Ambulatory Visit (HOSPITAL_COMMUNITY): Payer: Medicare Other

## 2020-07-02 ENCOUNTER — Encounter (HOSPITAL_COMMUNITY): Payer: Self-pay

## 2020-07-09 NOTE — Progress Notes (Signed)
Remote pacemaker transmission.   

## 2020-09-10 ENCOUNTER — Other Ambulatory Visit (HOSPITAL_BASED_OUTPATIENT_CLINIC_OR_DEPARTMENT_OTHER): Payer: Self-pay

## 2020-09-17 ENCOUNTER — Ambulatory Visit (INDEPENDENT_AMBULATORY_CARE_PROVIDER_SITE_OTHER): Payer: Medicare Other

## 2020-09-17 DIAGNOSIS — I495 Sick sinus syndrome: Secondary | ICD-10-CM

## 2020-09-17 LAB — CUP PACEART REMOTE DEVICE CHECK
Battery Remaining Longevity: 117 mo
Battery Voltage: 3 V
Brady Statistic AP VP Percent: 0.04 %
Brady Statistic AP VS Percent: 64.45 %
Brady Statistic AS VP Percent: 0.01 %
Brady Statistic AS VS Percent: 35.5 %
Brady Statistic RA Percent Paced: 65.19 %
Brady Statistic RV Percent Paced: 0.05 %
Date Time Interrogation Session: 20220906035324
Implantable Lead Implant Date: 20180530
Implantable Lead Implant Date: 20180530
Implantable Lead Location: 753859
Implantable Lead Location: 753860
Implantable Lead Model: 5076
Implantable Lead Model: 5076
Implantable Pulse Generator Implant Date: 20180530
Lead Channel Impedance Value: 285 Ohm
Lead Channel Impedance Value: 323 Ohm
Lead Channel Impedance Value: 342 Ohm
Lead Channel Impedance Value: 399 Ohm
Lead Channel Pacing Threshold Amplitude: 0.625 V
Lead Channel Pacing Threshold Amplitude: 0.75 V
Lead Channel Pacing Threshold Pulse Width: 0.4 ms
Lead Channel Pacing Threshold Pulse Width: 0.4 ms
Lead Channel Sensing Intrinsic Amplitude: 12.625 mV
Lead Channel Sensing Intrinsic Amplitude: 12.625 mV
Lead Channel Sensing Intrinsic Amplitude: 3.5 mV
Lead Channel Sensing Intrinsic Amplitude: 3.5 mV
Lead Channel Setting Pacing Amplitude: 1.5 V
Lead Channel Setting Pacing Amplitude: 2 V
Lead Channel Setting Pacing Pulse Width: 0.4 ms
Lead Channel Setting Sensing Sensitivity: 2 mV

## 2020-09-26 NOTE — Progress Notes (Signed)
Remote pacemaker transmission.   

## 2020-10-02 ENCOUNTER — Ambulatory Visit: Payer: Medicare Other | Admitting: Podiatry

## 2020-10-15 ENCOUNTER — Other Ambulatory Visit: Payer: Self-pay

## 2020-10-15 ENCOUNTER — Ambulatory Visit (INDEPENDENT_AMBULATORY_CARE_PROVIDER_SITE_OTHER): Payer: Medicare Other | Admitting: Podiatry

## 2020-10-15 ENCOUNTER — Encounter: Payer: Self-pay | Admitting: Podiatry

## 2020-10-15 DIAGNOSIS — M79675 Pain in left toe(s): Secondary | ICD-10-CM | POA: Diagnosis not present

## 2020-10-15 DIAGNOSIS — E118 Type 2 diabetes mellitus with unspecified complications: Secondary | ICD-10-CM | POA: Diagnosis not present

## 2020-10-15 DIAGNOSIS — M79674 Pain in right toe(s): Secondary | ICD-10-CM

## 2020-10-15 DIAGNOSIS — L84 Corns and callosities: Secondary | ICD-10-CM | POA: Diagnosis not present

## 2020-10-15 DIAGNOSIS — B351 Tinea unguium: Secondary | ICD-10-CM | POA: Diagnosis not present

## 2020-10-20 NOTE — Progress Notes (Signed)
  Subjective:  Patient ID: Travis [name], male    DOB: 26-May-1940,  MRN: [id number]  WAYLON HERSHEY presents to clinic today for preventative diabetic foot care and callus(es) bilateral feet and painful thick toenails that are difficult to trim. Painful toenails interfere with ambulation. Aggravating factors include wearing enclosed shoe gear. Pain is relieved with periodic professional debridement. Painful calluses are aggravated when weightbearing with and without shoegear. Pain is relieved with periodic professional debridement.  Patient does not monitor blood glucose daily.He continues to take Metformin daily.  He continues to run 5-6 times per week; total mileage about 30 miles/week.   He has cardiac pacemaker and follows up with Cardiology on a regular basis.  PCP is Tisovec, Adelfa Koh, MD, and last visit was one year ago.  No Known Allergies  Review of Systems: Negative except as noted in the HPI. Objective:   Constitutional CHARLE CLEAR is a pleasant 80 y.o. Caucasian male, WD, WN in NAD. AAO x 3.   Vascular Capillary fill time to digits <3 seconds b/l lower extremities. Palpable DP pulse(s) b/l lower extremities Palpable PT pulse(s) b/l lower extremities Pedal hair sparse. Lower extremity skin temperature gradient within normal limits. No pain with calf compression b/l. No edema noted b/l lower extremities. No cyanosis or clubbing noted.  Neurologic Normal speech. Oriented to person, place, and time. Protective sensation intact 5/5 intact bilaterally with 10g monofilament b/l. Vibratory sensation intact b/l.  Dermatologic Skin warm and supple b/l lower extremities. No open wounds b/l lower extremities. No interdigital macerations b/l lower extremities. Toenails 1-5 b/l elongated, discolored, dystrophic, thickened, crumbly with subungual debris and tenderness to dorsal palpation. Hyperkeratotic lesion(s) submet head 3 left foot and submet head 5 right foot.  No erythema, no edema, no  drainage, no fluctuance.  Orthopedic: Normal muscle strength 5/5 to all lower extremity muscle groups bilaterally. No gross bony deformities b/l lower extremities.   Radiographs: None  Assessment:   1. Pain due to onychomycosis of toenails of both feet   2. Callus   3. Type 2 diabetes mellitus with complication, without long-term current use of insulin (HCC)    Plan:  Patient was evaluated and treated and all questions answered. Consent given for treatment as described below: -Continue diabetic foot care principles: inspect feet daily, monitor glucose as recommended by PCP and/or Endocrinologist, and follow prescribed diet per PCP, Endocrinologist and/or dietician. -Patient to continue soft, supportive shoe gear daily. -Toenails 1-5 b/l were debrided in length and girth with sterile nail nippers and dremel without iatrogenic bleeding.  -Callus(es) submet head 3 left foot and submet head 5 right foot pared utilizing sterile scalpel blade without complication or incident. Total number debrided =2. -Patient to report any pedal injuries to medical professional immediately. -Patient/POA to call should there be question/concern in the interim.  Return in about 3 months (around 01/15/2021).  Freddie Breech, DPM

## 2020-10-28 ENCOUNTER — Other Ambulatory Visit: Payer: Self-pay

## 2020-10-28 ENCOUNTER — Encounter: Payer: Self-pay | Admitting: Cardiovascular Disease

## 2020-10-28 ENCOUNTER — Ambulatory Visit (INDEPENDENT_AMBULATORY_CARE_PROVIDER_SITE_OTHER): Payer: Medicare Other | Admitting: Cardiovascular Disease

## 2020-10-28 VITALS — BP 112/68 | HR 75 | Ht 71.0 in | Wt 156.6 lb

## 2020-10-28 DIAGNOSIS — I350 Nonrheumatic aortic (valve) stenosis: Secondary | ICD-10-CM | POA: Diagnosis not present

## 2020-10-28 DIAGNOSIS — Z950 Presence of cardiac pacemaker: Secondary | ICD-10-CM | POA: Diagnosis not present

## 2020-10-28 DIAGNOSIS — I2510 Atherosclerotic heart disease of native coronary artery without angina pectoris: Secondary | ICD-10-CM

## 2020-10-28 DIAGNOSIS — I4729 Other ventricular tachycardia: Secondary | ICD-10-CM

## 2020-10-28 DIAGNOSIS — I495 Sick sinus syndrome: Secondary | ICD-10-CM

## 2020-10-28 DIAGNOSIS — E785 Hyperlipidemia, unspecified: Secondary | ICD-10-CM

## 2020-10-28 DIAGNOSIS — I10 Essential (primary) hypertension: Secondary | ICD-10-CM

## 2020-10-28 LAB — PACEMAKER DEVICE OBSERVATION

## 2020-10-28 NOTE — Patient Instructions (Signed)

## 2020-10-28 NOTE — Progress Notes (Signed)
Cardiology Office Note:    Date:  10/30/2020   ID:  Travis [name], DOB [birth year], MRN [id number]  PCP:  Gaspar Garbe, MD  Cardiologist: Nicki Guadalajara M.D.; Thurmon Fair, MD    Referring MD: Wylene Simmer Adelfa Koh, MD  Chief Complaint  Patient presents with   Pacemaker Problem      History of Present Illness:    Travis [name] is a 80 y.o. male with a hx of Hypertension and hyperlipidemia, moderate CAD, mild aortic valve stenosis, severe sinus bradycardia, periods of junctional escape rhythm and sinus pauses of up to 6 seconds in duration on an event monitor, for which she received a dual-chamber pacemaker in 2018 (Medtronic MRI conditional)  He underwent elective implantation of a dual-chamber Medtronic Azure XT permanent pacemaker on 06/10/2016. He had coronary angiography the same day which showed 65% mid LAD, 70% second diagonal, 30-40%stenoses in the mid circumflex.  He has mild aortic valve stenosis by an echo performed in May 2020.  He is doing great.  He came in second and a 10K race just a couple of days ago (he competed in the over 1 year old category, was beaten only by a 81 year old).  He acknowledges that he has noticed a marked reduction in his running speed, but is still in great shape.  The patient specifically denies any chest pain at rest exertion, dyspnea at rest or with exertion, orthopnea, paroxysmal nocturnal dyspnea, syncope, palpitations, focal neurological deficits, intermittent claudication, lower extremity edema, unexplained weight gain, cough, hemoptysis or wheezing.  Underlying rhythm today is marked sinus bradycardia at around 40 bpm, with frequent sinus pauses in excess of 2 seconds, occasional PVCs.  Presenting rhythm is atrial paced, ventricular sensed with PVCs in a pattern of trigeminy.  Pacemaker interrogation shows normal device function.  Battery longevity is estimated at 9.7 years.  He has 61% atrial pacing and never requires ventricular pacing.   The heart rate histogram distribution appears very much appropriate.  He has had a handful of episodes of ventricular tachycardia.  Most recently he had a 6 beat run on October 10 and a 9 beat run on October 2.  The longest episode was a 14 beat run in May.  Last year he had a 22 beat episode of nonsustained VT.  None of these were symptomatic.  None of them occurred during the recent 10K race that he ran 2 days ago.  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   SSS (sick sinus syndrome) (HCC) 06/10/2016   Type II diabetes mellitus (HCC)     Past Surgical History:  Procedure Laterality Date   APPENDECTOMY     CARDIAC CATHETERIZATION  06/10/2016   CATARACT EXTRACTION W/ INTRAOCULAR LENS IMPLANT Right    CYSTOSCOPY W/ STONE MANIPULATION     INSERT / REPLACE / REMOVE PACEMAKER  06/10/2016   dual chamber permanent pacemaker   KNEE ARTHROSCOPY W/ MENISCECTOMY Bilateral    LAPAROSCOPIC CHOLECYSTECTOMY     LEFT HEART CATH AND CORONARY ANGIOGRAPHY N/A 06/10/2016   Procedure: Left Heart Cath and Coronary Angiography;  Surgeon: Dolores Patty, MD;  Location: [facility];  Service: Cardiovascular;  Laterality: N/A;   LITHOTRIPSY  PACEMAKER IMPLANT N/A 06/10/2016   Procedure: Pacemaker Implant;  Surgeon: Thurmon Fair, MD;  Location: [facility]AS[facility] CV [facility];  Service: Cardiovascular;  Laterality: N/A;   TONSILLECTOMY      Current Medications: Current Meds  Medication Sig   amLODipine (NORVASC) 5 MG tablet TAKE 1&1/2 TABLETS ONCE DAILY.   aspirin EC 81 MG tablet Take 81 mg by mouth daily.   atorvastatin (LIPITOR) 40 MG tablet TAKE 1 TABLET ONCE DAILY.   Cholecalciferol (VITAMIN D3) 2000 units capsule Take 2,000 Units by mouth daily.    COMBIGAN 0.2-0.5 % ophthalmic solution Place 1 drop into both eyes 2 (two) times daily.    ezetimibe (ZETIA) 10 MG tablet TAKE 1 TABLET ONCE DAILY.   hydrochlorothiazide (MICROZIDE) 12.5 MG capsule TAKE 1 CAPSULE EVERY DAY.   lisinopril (ZESTRIL) 20 MG tablet TAKE ONE TABLET BY MOUTH ONCE DAILY.   metFORMIN (GLUCOPHAGE) 500 MG tablet Take 500 mg by mouth 2 (two) times daily with a meal.      Allergies:   Patient has no known allergies.   Social History   Socioeconomic History   Marital status: Married    Spouse name: Not on file   Number of children: Not on file   Years of education: Not on file   Highest education level: Not on file  Occupational History   Not on file  Tobacco Use   Smoking status: Former    Packs/day: 0.50    Years: 25.00    Pack years: 12.50    Types: Cigarettes    Quit date: 67    Years since quitting: 39.8   Smokeless tobacco: Never  Vaping Use   Vaping Use: Never used  Substance and Sexual Activity   Alcohol use: Yes    Comment: 06/10/2016 "might have 2 beers/month"   Drug use: No   Sexual activity: Not on file  Other Topics Concern   Not on file  Social History Narrative   Not on file   Social Determinants of Health   Financial Resource Strain: Not on file  Food Insecurity: Not on file  Transportation Needs: Not on file  Physical Activity: Not on file  Stress: Not on file  Social Connections: Not on file     Family History: The patient's family history includes Colon cancer in his father. ROS:   Please see the history of present illness.    All other systems are reviewed and are negative.   EKGs/Labs/Other Studies Reviewed:    The following studies were reviewed today: Full pacemaker check performed today  May 24, 2018 echocardiogram  1. The left ventricle has normal systolic function, with an ejection fraction of 55-60%. The cavity size was normal. Left ventricular diastolic parameters were normal.  2. The right ventricle has  normal systolic function. The cavity was mildly enlarged. There is no increase in right ventricular wall thickness. Right ventricular systolic pressure is mildly elevated with an estimated pressure of 32.9 mmHg.  3. Left atrial size was mildly dilated.  4. Right atrial size was moderately dilated.  5. The aortic valve is tricuspid. Mild thickening of the aortic valve. Mild calcification of the aortic valve. Mild stenosis of the aortic valve.  6. The aortic root is normal in size and structure.  7. The inferior vena cava was normal in size with <50% respiratory variability.  EKG:  EKG is ordered today.  Personally reviewed that shows atrial paced ventricular sensed rhythm with PVCs in a pattern of trigeminy  Recent Labs:  No results found for requested labs within last 8760 hours.  10/09/2019 Hemoglobin 14.1, creatinine 1.3, TSH 3.1, hemoglobin A1c 6.6% Total cholesterol 110, HDL 52, LDL 50, triglycerides 82, APO B 59  10/23/2020 Cholesterol 103, HDL 46, LDL 46, triglycerides 53 potassium 4.5, normal liver function tests, hemoglobin A1c 6.2%  Physical Exam:    VS:  BP 112/68 (BP Location: Left Arm, Patient Position: Sitting, Cuff Size: Normal)   Pulse 75   Ht 5\' 11"  (1.803 m)   Wt 156 lb 9.6 oz (71 kg)   SpO2 100%   BMI 21.84 kg/m     Wt Readings from Last 3 Encounters:  10/28/20 156 lb 9.6 oz (71 kg)  12/04/19 159 lb 3.2 oz (72.2 kg)  10/23/19 158 lb 9.6 oz (71.9 kg)     General: Alert, oriented x3, no distress, appears fit, younger than stated age.  Healthy left subclavian pacemaker site Head: no evidence of trauma, PERRL, EOMI, no exophtalmos or lid lag, no myxedema, no xanthelasma; normal ears, nose and oropharynx Neck: normal jugular venous pulsations and no hepatojugular reflux; brisk carotid pulses without delay and no carotid bruits Chest: clear to auscultation, no signs of consolidation by percussion or palpation, normal fremitus, symmetrical and full respiratory  excursions Cardiovascular: normal position and quality of the apical impulse, regular rhythm, normal first and second heart sounds, no murmurs, rubs or gallops Abdomen: no tenderness or distention, no masses by palpation, no abnormal pulsatility or arterial bruits, normal bowel sounds, no hepatosplenomegaly Extremities: no clubbing, cyanosis or edema; 2+ radial, ulnar and brachial pulses bilaterally; 2+ right femoral, posterior tibial and dorsalis pedis pulses; 2+ left femoral, posterior tibial and dorsalis pedis pulses; no subclavian or femoral bruits Neurological: grossly nonfocal Psych: Normal mood and affect    ASSESSMENT:    1. SSS (sick sinus syndrome) (HCC)   2. Pacemaker   3. Coronary artery disease involving native coronary artery of native heart without angina pectoris   4. Nonrheumatic aortic valve stenosis   5. Essential hypertension   6. NSVT (nonsustained ventricular tachycardia)   7. Hyperlipidemia with target LDL less than 70       PLAN:    In order of problems listed above:  1. SSS: No symptoms of bradycardia.  Appropriate heart rate sensor settings for activity level, based on histograms and symptoms. 2. Pacemaker: Normal device function.  Continue remote downloads every 3 months. 3. CAD: Moderate disease identified at angiography a few years ago, but he does not have angina even when running. 4. AS: Was mild by most recent echocardiogram in early 2021 5. HTN: Excellent control 6. NSVT: Every month or 2 he has an episode of nonsustained VT, sometimes lengthy (longest recorded today 22 beats), never sustained and never symptomatic.  Monitor for electrolyte imbalances since he takes a thiazide diuretic.  Very recent potassium was normal at 4.5. 7. HLP: Excellent lipid profile.  Continue statin.    Medication Adjustments/Labs and Tests Ordered: Current medicines are reviewed at length with the patient today.  Concerns regarding medicines are outlined above. Labs  and tests ordered and medication changes are outlined in the patient instructions below:  Patient Instructions  Medication Instructions:  No changes *If you need a refill on your cardiac medications before your next appointment, please call your pharmacy*   Lab Work: None ordered If you have labs (blood work) drawn today and your tests are completely normal, you will receive your results only by: MyChart Message (if you have MyChart) OR  A paper copy in the mail If you have any lab test that is abnormal or we need to change your treatment, we will call you to review the results.   Testing/Procedures: None ordered   Follow-Up: At [facility], you and your health needs are our priority.  As part of our continuing mission to provide you with exceptional heart care, we have created designated Provider Care Teams.  These Care Teams include your primary Cardiologist (physician) and Advanced Practice Providers (APPs -  Physician Assistants and Nurse Practitioners) who all work together to provide you with the care you need, when you need it.  We recommend signing up for the patient portal called "MyChart".  Sign up information is provided on this After Visit Summary.  MyChart is used to connect with patients for Virtual Visits (Telemedicine).  Patients are able to view lab/test results, encounter notes, upcoming appointments, etc.  Non-urgent messages can be sent to your provider as well.   To learn more about what you can do with MyChart, go to [email or web address].    Your next appointment:   12 month(s)  The format for your next appointment:   In Person  Provider:   Thurmon Fair, MD     Signed, Thurmon Fair, MD  10/30/2020 8:47 AM    [facility]

## 2020-10-30 ENCOUNTER — Encounter: Payer: Self-pay | Admitting: Cardiovascular Disease

## 2020-11-13 ENCOUNTER — Other Ambulatory Visit: Payer: Self-pay

## 2020-11-13 ENCOUNTER — Encounter (HOSPITAL_BASED_OUTPATIENT_CLINIC_OR_DEPARTMENT_OTHER): Payer: Self-pay | Admitting: Obstetrics and Gynecology

## 2020-11-13 ENCOUNTER — Emergency Department (HOSPITAL_BASED_OUTPATIENT_CLINIC_OR_DEPARTMENT_OTHER)
Admission: EM | Admit: 2020-11-13 | Discharge: 2020-11-13 | Disposition: A | Discharge status: Home / Self Care | Payer: Medicare Other | Attending: Emergency Medicine | Admitting: Emergency Medicine

## 2020-11-13 DIAGNOSIS — L509 Urticaria, unspecified: Secondary | ICD-10-CM | POA: Diagnosis not present

## 2020-11-13 DIAGNOSIS — Z87891 Personal history of nicotine dependence: Secondary | ICD-10-CM | POA: Insufficient documentation

## 2020-11-13 DIAGNOSIS — I10 Essential (primary) hypertension: Secondary | ICD-10-CM | POA: Diagnosis not present

## 2020-11-13 DIAGNOSIS — Z7984 Long term (current) use of oral hypoglycemic drugs: Secondary | ICD-10-CM | POA: Insufficient documentation

## 2020-11-13 DIAGNOSIS — E119 Type 2 diabetes mellitus without complications: Secondary | ICD-10-CM | POA: Diagnosis not present

## 2020-11-13 DIAGNOSIS — Z950 Presence of cardiac pacemaker: Secondary | ICD-10-CM | POA: Insufficient documentation

## 2020-11-13 DIAGNOSIS — I2510 Atherosclerotic heart disease of native coronary artery without angina pectoris: Secondary | ICD-10-CM | POA: Insufficient documentation

## 2020-11-13 DIAGNOSIS — Z7982 Long term (current) use of aspirin: Secondary | ICD-10-CM | POA: Insufficient documentation

## 2020-11-13 DIAGNOSIS — Z79899 Other long term (current) drug therapy: Secondary | ICD-10-CM | POA: Diagnosis not present

## 2020-11-13 DIAGNOSIS — M7989 Other specified soft tissue disorders: Secondary | ICD-10-CM

## 2020-11-13 MED ORDER — PREDNISONE 20 MG PO TABS
40.0000 mg | ORAL_TABLET | Freq: Once | ORAL | Status: AC
  Administered 2020-11-13: 40 mg via ORAL
  Filled 2020-11-13: qty 2

## 2020-11-13 MED ORDER — PREDNISONE 20 MG PO TABS: ORAL_TABLET | ORAL | 0 refills | Status: DC

## 2020-11-13 MED ORDER — DIPHENHYDRAMINE HCL 25 MG PO CAPS
25.0000 mg | ORAL_CAPSULE | Freq: Once | ORAL | Status: AC
  Administered 2020-11-13: 25 mg via ORAL
  Filled 2020-11-13: qty 1

## 2020-11-13 NOTE — ED Notes (Signed)
ED Provider at bedside. 

## 2020-11-13 NOTE — ED Provider Notes (Signed)
[facility] EMERGENCY DEPT Provider Note   CSN: [id number] Arrival date & time: 11/13/20  1557     History Chief Complaint  Patient presents with   Joint Swelling    Travis [name] is a 80 y.o. male.  HPI Patient reports that he worked out at the med center gym today in the afternoon.  He reports he did a lot of exercises and did lifting with his hands and wrists.  He was not having any pain in hands or wrists.  He reports when he got out to his car in the parking deck, he first noticed that on the inside of his right wrist he had a itchy red spot that was starting to get swollen.Marland Kitchen  He reports it was not painful.  He went home for dinner and started to notice increased itching in his wrist and hand.  From there it started to swell up pretty quickly.  He reports that the hand became swollen and part way up his forearm became swollen and red.  He denies is painful.  He reports it feels a little tight but there is no pain.  He does report it feels itchy.  He has not tried any medications or anything to relieve symptoms.  He denies he developed any other symptoms.  No shortness of breath no chest tightness no facial swelling.    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   SSS (sick sinus syndrome) (HCC) 06/10/2016   Type II diabetes mellitus (HCC)     Patient Active Problem List   Diagnosis Date Noted   Left patella fracture 12/16/2017   CAD (coronary artery disease), native coronary artery 09/14/2016   Pacemaker 06/10/2016   Paroxysmal atrial fibrillation (HCC) 06/07/2016   Sinus pause 06/07/2016   Bradycardia 06/04/2016   Groin pain 06/29/2014   Piriformis syndrome of left side 10/30/2013    Past Surgical History:   Procedure Laterality Date   APPENDECTOMY     CARDIAC CATHETERIZATION  06/10/2016   CATARACT EXTRACTION W/ INTRAOCULAR LENS IMPLANT Right    CYSTOSCOPY W/ STONE MANIPULATION     INSERT / REPLACE / REMOVE PACEMAKER  06/10/2016   dual chamber permanent pacemaker   KNEE ARTHROSCOPY W/ MENISCECTOMY Bilateral    LAPAROSCOPIC CHOLECYSTECTOMY     LEFT HEART CATH AND CORONARY ANGIOGRAPHY N/A 06/10/2016   Procedure: Left Heart Cath and Coronary Angiography;  Surgeon: Dolores Patty, MD;  Location: [facility] INVASIVE CV LAB;  Service: Cardiovascular;  Laterality: N/A;   LITHOTRIPSY     PACEMAKER IMPLANT N/A 06/10/2016   Procedure: Pacemaker Implant;  Surgeon: Thurmon Fair, MD;  Location: [facility] INVASIVE CV LAB;  Service: Cardiovascular;  Laterality: N/A;   TONSILLECTOMY         Family History  Problem Relation Age of Onset   Colon cancer Father     Social History   Tobacco Use   Smoking status: Former    Packs/day: 0.50    Years: 25.00    Pack years: 12.50    Types: Cigarettes    Quit date: 68    Years since quitting: 39.8   Smokeless tobacco: Never  Vaping Use   Vaping Use: Never used  Substance  Use Topics   Alcohol use: Yes    Comment: 06/10/2016 "might have 2 beers/month"   Drug use: No    Home Medications Prior to Admission medications   Medication Sig Start Date End Date Taking? Authorizing Provider  predniSONE (DELTASONE) 20 MG tablet 2 tabs po daily x 3 days 11/13/20  Yes Reiley Bertagnolli, Lebron Conners, MD  amLODipine (NORVASC) 5 MG tablet TAKE 1&1/2 TABLETS ONCE DAILY. 06/11/20   Lennette Bihari, MD  aspirin EC 81 MG tablet Take 81 mg by mouth daily.    [provider]  atorvastatin (LIPITOR) 40 MG tablet TAKE 1 TABLET ONCE DAILY. 04/10/20   Lennette Bihari, MD  Cholecalciferol (VITAMIN D3) 2000 units capsule Take 2,000 Units by mouth daily.    [provider]  COMBIGAN 0.2-0.5 % ophthalmic solution Place 1 drop into both eyes 2 (two) times daily.  03/13/16   [provider]  COVID-19 mRNA Vac-TriS, Pfizer, (PFIZER-BIONT COVID-19 VAC-TRIS) SUSP injection Inject into the muscle. 05/23/20   Judyann Munson, MD  ezetimibe (ZETIA) 10 MG tablet TAKE 1 TABLET ONCE DAILY. 04/10/20   Lennette Bihari, MD  hydrochlorothiazide (MICROZIDE) 12.5 MG capsule TAKE 1 CAPSULE EVERY DAY. 05/20/20   Lennette Bihari, MD  lisinopril (ZESTRIL) 20 MG tablet TAKE ONE TABLET BY MOUTH ONCE DAILY. 07/01/20   Lennette Bihari, MD  metFORMIN (GLUCOPHAGE) 500 MG tablet Take 500 mg by mouth 2 (two) times daily with a meal.     [provider]    Allergies    Patient has no known allergies.  Review of Systems   Review of Systems Constitutional: No fever no chills no general malaise Respiratory: No shortness of breath no chest tightness no cough GI: No nausea no vomiting no abdominal discomfort. Physical Exam Updated Vital Signs BP (!) 161/86 (BP Location: Left Arm)   Pulse (!) 54   Temp 98.1 F (36.7 C) (Oral)   Resp 16   SpO2 100%   Physical Exam Constitutional:      Appearance: Normal appearance.  HENT:     Head: Normocephalic and atraumatic.     Mouth/Throat:     Pharynx: Oropharynx is clear.  Eyes:     Extraocular Movements: Extraocular movements intact.  Cardiovascular:     Rate and Rhythm: Normal rate and regular rhythm.  Pulmonary:     Effort: Pulmonary effort is normal.     Breath sounds: Normal breath sounds.  Abdominal:     General: There is no distension.     Palpations: Abdomen is soft.     Tenderness: There is no abdominal tenderness.  Musculoskeletal:     Cervical back: Neck supple.     Comments: Moderate diffuse edema of the right hand and forearm with a clear demarcated border of edema.  Patient does not have any pain with range of motion of the wrist or flexion and extension of the fingers.  Skin:    General: Skin is warm and dry.  Neurological:     General: No focal deficit present.     Mental Status: He is alert and oriented to  person, place, and time.     Coordination: Coordination normal.       ED Results / Procedures / Treatments   Labs (all labs ordered are listed, but only abnormal results are displayed) Labs Reviewed - No data to display  EKG None  Radiology No results found.  Procedures Procedures   Medications Ordered in ED Medications  diphenhydrAMINE (BENADRYL)  capsule 25 mg (25 mg Oral Given 11/13/20 1926)  predniSONE (DELTASONE) tablet 40 mg (40 mg Oral Given 11/13/20 1926)    ED Course  I have reviewed the triage vital signs and the nursing notes.  Pertinent labs & imaging results that were available during my care of the patient were reviewed by me and considered in my medical decision making (see chart for details).    MDM Rules/Calculators/A&P                           Patient presents as outlined.  At this time I have highest patient for urticarial response.  Patient was using his hand to just before symptoms onset without any pain in the joints or with movement of the hand.  Symptoms started with a red itchy spot on the volar surface of the forearm.  The physical exam is for no pain with flexion and extension of the digits or range of motion of the wrist.  The area of erythema and swelling has a clear step off of edema that is urticarial in appearance.  I have reviewed the differential diagnosis with the patient.  We discussed signs and symptoms of infection for return.  At this time the plan will be for prednisone and Benadryl.  I have also counseled patient on the possibility of agitation or confusion with Benadryl and prednisone in older patients.  Patient reports he has family members with him for support and observation. Final Clinical Impression(s) / ED Diagnoses Final diagnoses:  Swelling of right hand  Urticaria    Rx / DC Orders ED Discharge Orders          Ordered    predniSONE (DELTASONE) 20 MG tablet        11/13/20 1950             Arby Barrette,  MD 11/13/20 2006

## 2020-11-13 NOTE — Discharge Instructions (Addendum)
1.  At this time the redness and swelling of her hand and forearm appear to be due to a bite or sting with allergic reaction.  The other possibility is infection but this seems less likely based on the history and exam.  At this time, the treatment is for allergic reaction.  People can get very large swelling and redness of their extremities from a single sting or bite.  If, you get pain in your joints or pain moving your fingers, return to the emergency department immediately for recheck.  Return for fever generally feeling ill or other concerning symptoms. 2.  You were given a dose of prednisone and Benadryl in the emergency department.  You will take the prednisone each evening for the next 3 nights.  Fill the prescription at the pharmacy.  You may take 25 mg of Benadryl every 4-6 hours for swelling and itching.  Elevate your arm is much as possible.  You may also apply cool compresses. 3.  Schedule a follow-up recheck with your doctor soon as possible.

## 2020-11-13 NOTE — ED Triage Notes (Signed)
Patient reports to the ER for arm and hand swelling of unknown origin. States it started 3 hours ago. Denies injury or laceration or known bite

## 2020-11-28 ENCOUNTER — Emergency Department (HOSPITAL_COMMUNITY): Payer: Medicare Other

## 2020-11-28 ENCOUNTER — Other Ambulatory Visit: Payer: Self-pay

## 2020-11-28 ENCOUNTER — Encounter (HOSPITAL_COMMUNITY): Payer: Self-pay | Admitting: Emergency Medicine

## 2020-11-28 ENCOUNTER — Emergency Department (HOSPITAL_COMMUNITY)
Admission: EM | Admit: 2020-11-28 | Discharge: 2020-11-28 | Disposition: A | Discharge status: Home / Self Care | Payer: Medicare Other | Attending: Emergency Medicine | Admitting: Emergency Medicine

## 2020-11-28 DIAGNOSIS — Y9302 Activity, running: Secondary | ICD-10-CM | POA: Insufficient documentation

## 2020-11-28 DIAGNOSIS — W010XXA Fall on same level from slipping, tripping and stumbling without subsequent striking against object, initial encounter: Secondary | ICD-10-CM | POA: Diagnosis not present

## 2020-11-28 DIAGNOSIS — S01511A Laceration without foreign body of lip, initial encounter: Secondary | ICD-10-CM

## 2020-11-28 DIAGNOSIS — Z23 Encounter for immunization: Secondary | ICD-10-CM | POA: Diagnosis not present

## 2020-11-28 DIAGNOSIS — Z7982 Long term (current) use of aspirin: Secondary | ICD-10-CM | POA: Insufficient documentation

## 2020-11-28 DIAGNOSIS — Z7984 Long term (current) use of oral hypoglycemic drugs: Secondary | ICD-10-CM | POA: Insufficient documentation

## 2020-11-28 DIAGNOSIS — S022XXA Fracture of nasal bones, initial encounter for closed fracture: Secondary | ICD-10-CM

## 2020-11-28 DIAGNOSIS — Z79899 Other long term (current) drug therapy: Secondary | ICD-10-CM | POA: Diagnosis not present

## 2020-11-28 DIAGNOSIS — Z87891 Personal history of nicotine dependence: Secondary | ICD-10-CM | POA: Insufficient documentation

## 2020-11-28 DIAGNOSIS — S0181XA Laceration without foreign body of other part of head, initial encounter: Secondary | ICD-10-CM | POA: Diagnosis present

## 2020-11-28 DIAGNOSIS — I2510 Atherosclerotic heart disease of native coronary artery without angina pectoris: Secondary | ICD-10-CM | POA: Insufficient documentation

## 2020-11-28 DIAGNOSIS — I10 Essential (primary) hypertension: Secondary | ICD-10-CM | POA: Diagnosis not present

## 2020-11-28 DIAGNOSIS — W19XXXA Unspecified fall, initial encounter: Secondary | ICD-10-CM

## 2020-11-28 DIAGNOSIS — E119 Type 2 diabetes mellitus without complications: Secondary | ICD-10-CM | POA: Insufficient documentation

## 2020-11-28 MED ORDER — CEPHALEXIN 500 MG PO CAPS: 500.0000 mg | ORAL_CAPSULE | Freq: Two times a day (BID) | ORAL | 0 refills | Status: AC

## 2020-11-28 MED ORDER — TETANUS-DIPHTH-ACELL PERTUSSIS 5-2.5-18.5 LF-MCG/0.5 IM SUSY
0.5000 mL | PREFILLED_SYRINGE | Freq: Once | INTRAMUSCULAR | Status: AC
  Administered 2020-11-28: 23:00:00 0.5 mL via INTRAMUSCULAR
  Filled 2020-11-28: qty 0.5

## 2020-11-28 MED ORDER — LIDOCAINE-EPINEPHRINE (PF) 2 %-1:200000 IJ SOLN
10.0000 mL | Freq: Once | INTRAMUSCULAR | Status: AC
  Administered 2020-11-28: 10 mL
  Filled 2020-11-28: qty 20

## 2020-11-28 NOTE — ED Provider Notes (Signed)
[facility] EMERGENCY DEPARTMENT Provider Note   CSN: [id number] Arrival date & time: 11/28/20  1309     History Chief Complaint  Patient presents with   Travis [name] is a 80 y.o. male with past medical history significant for hypertension, sick sinus syndrome, pacemaker, diabetes who presents for evaluation mechanical fall.  He runs for exercise.  Was running subsequently tripped and fell.  Hit the front of his face on the gravel asphalt.  He admits to laceration to forehead as well as pain to his nose.  Denies any loose teeth.  He denies any presyncope or syncope symptoms.  He denies headache, dizziness, lightheadedness, neck pain, chest pain, shortness of breath, abdominal pain, extremity pain.  No emesis since.  Unknown last tetanus.  Denies additional aggravating or alleviating factors. Rates pain a 3/10 to his forehead.  History obtained from patient, wife in room and past medical records.  No interpreter is used.  Wife states apparently have seen Dr. 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   SSS (sick sinus syndrome) (HCC) 06/10/2016   Type II diabetes mellitus (HCC)     Patient Active Problem List   Diagnosis Date Noted   Left patella fracture 12/16/2017   CAD (coronary artery disease), native coronary artery 09/14/2016   Pacemaker 06/10/2016   Paroxysmal atrial fibrillation (HCC) 06/07/2016   Sinus pause 06/07/2016   Bradycardia 06/04/2016   Groin pain 06/29/2014   Piriformis syndrome of left side 10/30/2013    Past Surgical History:  Procedure Laterality Date   APPENDECTOMY     CARDIAC CATHETERIZATION  06/10/2016   CATARACT  EXTRACTION W/ INTRAOCULAR LENS IMPLANT Right    CYSTOSCOPY W/ STONE MANIPULATION     INSERT / REPLACE / REMOVE PACEMAKER  06/10/2016   dual chamber permanent pacemaker   KNEE ARTHROSCOPY W/ MENISCECTOMY Bilateral    LAPAROSCOPIC CHOLECYSTECTOMY     LEFT HEART CATH AND CORONARY ANGIOGRAPHY N/A 06/10/2016   Procedure: Left Heart Cath and Coronary Angiography;  Surgeon: Dolores Patty, MD;  Location: [facility] INVASIVE CV LAB;  Service: Cardiovascular;  Laterality: N/A;   LITHOTRIPSY     PACEMAKER IMPLANT N/A 06/10/2016   Procedure: Pacemaker Implant;  Surgeon: Thurmon Fair, MD;  Location: [facility] INVASIVE CV LAB;  Service: Cardiovascular;  Laterality: N/A;   TONSILLECTOMY         Family History  Problem Relation Age of Onset   Colon cancer Father     Social History   Tobacco Use   Smoking status: Former    Packs/day: 0.50    Years: 25.00    Pack years: 12.50    Types: Cigarettes    Quit date: 49    Years since quitting: 39.9   Smokeless tobacco: Never  Vaping Use   Vaping Use: Never used  Substance Use Topics   Alcohol use: Yes    Comment: 06/10/2016 "might have 2 beers/month"   Drug use: No    Home Medications Prior to Admission medications   Medication Sig Start Date End Date Taking? Authorizing Provider  cephALEXin (KEFLEX) 500 MG capsule Take 1  capsule (500 mg total) by mouth 2 (two) times daily for 7 days. 11/28/20 12/05/20 Yes Donalee Gaumond A, PA-C  amLODipine (NORVASC) 5 MG tablet TAKE 1&1/2 TABLETS ONCE DAILY. 06/11/20   Lennette Bihari, MD  aspirin EC 81 MG tablet Take 81 mg by mouth daily.    [provider]  atorvastatin (LIPITOR) 40 MG tablet TAKE 1 TABLET ONCE DAILY. 04/10/20   Lennette Bihari, MD  Cholecalciferol (VITAMIN D3) 2000 units capsule Take 2,000 Units by mouth daily.    [provider]  COMBIGAN 0.2-0.5 % ophthalmic solution Place 1 drop into both eyes 2 (two) times daily.  03/13/16   [provider]  COVID-19 mRNA Vac-TriS,  Pfizer, (PFIZER-BIONT COVID-19 VAC-TRIS) SUSP injection Inject into the muscle. 05/23/20   Judyann Munson, MD  ezetimibe (ZETIA) 10 MG tablet TAKE 1 TABLET ONCE DAILY. 04/10/20   Lennette Bihari, MD  hydrochlorothiazide (MICROZIDE) 12.5 MG capsule TAKE 1 CAPSULE EVERY DAY. 05/20/20   Lennette Bihari, MD  lisinopril (ZESTRIL) 20 MG tablet TAKE ONE TABLET BY MOUTH ONCE DAILY. 07/01/20   Lennette Bihari, MD  metFORMIN (GLUCOPHAGE) 500 MG tablet Take 500 mg by mouth 2 (two) times daily with a meal.     [provider]  predniSONE (DELTASONE) 20 MG tablet 2 tabs po daily x 3 days 11/13/20   Arby Barrette, MD    Allergies    Patient has no known allergies.  Review of Systems   Review of Systems  Constitutional: Negative.   HENT: Negative.    Respiratory: Negative.    Cardiovascular: Negative.   Gastrointestinal: Negative.   Genitourinary: Negative.   Musculoskeletal: Negative.   Skin:  Positive for wound.  Neurological: Negative.   All other systems reviewed and are negative.  Physical Exam Updated Vital Signs BP (!) 159/64 (BP Location: Right Arm)   Pulse 70   Temp (!) 97.5 F (36.4 C)   Resp 17   Ht 5\' 11"  (1.803 m)   Wt 70.3 kg   SpO2 100%   BMI 21.62 kg/m   Physical Exam Vitals and nursing note reviewed.  Constitutional:      General: He is not in acute distress.    Appearance: He is well-developed. He is not ill-appearing, toxic-appearing or diaphoretic.  HENT:     Head: Normocephalic.     Jaw: There is normal jaw occlusion.      Comments: Soft tissue swelling central forehead with overlying 2 cm laceration.  Not bleeding.  Multiple abrasions to nose however no laceration.  No drooling, dysphagia or trismus.     Right Ear: Tympanic membrane, ear canal and external ear normal. There is no impacted cerumen.     Left Ear: Tympanic membrane, ear canal and external ear normal. There is no impacted cerumen.     Ears:     Comments: No hemotympanums    Nose: Nasal  deformity, signs of injury and nasal tenderness present. No laceration, mucosal edema or rhinorrhea.     Right Nostril: Epistaxis present. No foreign body or septal hematoma.     Left Nostril: Epistaxis present. No foreign body, septal hematoma or occlusion.     Right Sinus: No maxillary sinus tenderness or frontal sinus tenderness.     Left Sinus: No maxillary sinus tenderness or frontal sinus tenderness.      Comments: Tenderness nasal bridge with overlying abrasion however no laceration.  Stigmata dried epistaxis bilateral nares, no active bleed.  No septal hematoma  Mouth/Throat:     Lips: Pink.     Mouth: Mucous membranes are moist.     Dentition: Normal dentition. No dental tenderness.     Tongue: No lesions.     Pharynx: Oropharynx is clear. Uvula midline.     Tonsils: No tonsillar exudate or tonsillar abscesses.      Comments: No loose dentition.  V-shaped superficial abrasion left philtrum Eyes:     Extraocular Movements: Extraocular movements intact.     Conjunctiva/sclera: Conjunctivae normal.     Pupils: Pupils are equal, round, and reactive to light.     Visual Fields: Right eye visual fields normal and left eye visual fields normal.     Comments: No traumatic hyphema, PERRLA, full range of motion without difficulty  Neck:     Trachea: Trachea and phonation normal.     Comments: No midline tenderness.  Full range of motion to neck Cardiovascular:     Rate and Rhythm: Normal rate and regular rhythm.     Pulses: Normal pulses.          Radial pulses are 2+ on the right side and 2+ on the left side.     Heart sounds: Normal heart sounds.  Pulmonary:     Effort: Pulmonary effort is normal. No respiratory distress.     Breath sounds: Normal breath sounds and air entry.     Comments: Clear bilaterally, speaks in full sentences without difficulty Chest:     Comments: Nontender Abdominal:     General: Bowel sounds are normal. There is no distension.     Palpations:  Abdomen is soft.     Tenderness: There is no abdominal tenderness. There is no guarding or rebound.     Comments: Soft, nontender  Musculoskeletal:        General: Normal range of motion.     Cervical back: Full passive range of motion without pain, normal range of motion and neck supple.     Comments: Full ROM without difficulty. No midline C/T/L tenderness  Skin:    General: Skin is warm and dry.     Capillary Refill: Capillary refill takes less than 2 seconds.     Comments: Multiple skin abrasions.  2 cm laceration to forehead.  Abrasion to left philtrum  Neurological:     General: No focal deficit present.     Mental Status: He is alert and oriented to person, place, and time.     Cranial Nerves: Cranial nerves 2-12 are intact.     Sensory: Sensation is intact.     Motor: Motor function is intact.     Gait: Gait is intact.     Comments: CN 2-12 grossly intact Ambulatory without difficulty    ED Results / Procedures / Treatments   Labs (all labs ordered are listed, but only abnormal results are displayed) Labs Reviewed - No data to display  EKG None  Radiology CT Head Wo Contrast  Result Date: 11/28/2020 CLINICAL DATA:  Fall.  Head trauma. EXAM: CT HEAD WITHOUT CONTRAST CT MAXILLOFACIAL WITHOUT CONTRAST CT CERVICAL SPINE WITHOUT CONTRAST TECHNIQUE: Multidetector CT imaging of the head, cervical spine, and maxillofacial structures were performed using the standard protocol without intravenous contrast. Multiplanar CT image reconstructions of the cervical spine and maxillofacial structures were also generated. COMPARISON:  None. FINDINGS: CT HEAD FINDINGS Brain: Generalized atrophy. Negative for acute infarct, hemorrhage, mass. Vascular: Negative for hyperdense vessel Skull: Negative for skull fracture Other: None CT MAXILLOFACIAL FINDINGS Osseous: Mildly displaced nasal  bone fractures. No fracture of the nasal septum. No other facial fracture. Poor dentition with numerous caries.  Orbits: Negative for orbital mass or hematoma. Sinuses: Mucosal edema in the paranasal sinuses most prominent right maxillary sinus. No air-fluid level. Small right mastoid effusion Soft tissues: Diffuse soft tissue swelling of the nose and forehead. CT CERVICAL SPINE FINDINGS Alignment: Normal Skull base and vertebrae: Negative for fracture Soft tissues and spinal canal: Atherosclerotic calcification in the carotid artery bilaterally. Disc levels: Disc degeneration and spurring C5-6 and C6-7 without significant stenosis. Upper chest: Lung apices clear bilaterally. Left-sided transvenous pacemaker. Other: None IMPRESSION: 1. No acute intracranial abnormality 2. Nasal bone fracture with overlying soft tissue swelling. No other facial fracture 3. Negative for cervical spine fracture. Electronically Signed   By: Marlan Palau M.D.   On: 11/28/2020 16:46   CT Cervical Spine Wo Contrast  Result Date: 11/28/2020 CLINICAL DATA:  Fall.  Head trauma. EXAM: CT HEAD WITHOUT CONTRAST CT MAXILLOFACIAL WITHOUT CONTRAST CT CERVICAL SPINE WITHOUT CONTRAST TECHNIQUE: Multidetector CT imaging of the head, cervical spine, and maxillofacial structures were performed using the standard protocol without intravenous contrast. Multiplanar CT image reconstructions of the cervical spine and maxillofacial structures were also generated. COMPARISON:  None. FINDINGS: CT HEAD FINDINGS Brain: Generalized atrophy. Negative for acute infarct, hemorrhage, mass. Vascular: Negative for hyperdense vessel Skull: Negative for skull fracture Other: None CT MAXILLOFACIAL FINDINGS Osseous: Mildly displaced nasal bone fractures. No fracture of the nasal septum. No other facial fracture. Poor dentition with numerous caries. Orbits: Negative for orbital mass or hematoma. Sinuses: Mucosal edema in the paranasal sinuses most prominent right maxillary sinus. No air-fluid level. Small right mastoid effusion Soft tissues: Diffuse soft tissue swelling of the  nose and forehead. CT CERVICAL SPINE FINDINGS Alignment: Normal Skull base and vertebrae: Negative for fracture Soft tissues and spinal canal: Atherosclerotic calcification in the carotid artery bilaterally. Disc levels: Disc degeneration and spurring C5-6 and C6-7 without significant stenosis. Upper chest: Lung apices clear bilaterally. Left-sided transvenous pacemaker. Other: None IMPRESSION: 1. No acute intracranial abnormality 2. Nasal bone fracture with overlying soft tissue swelling. No other facial fracture 3. Negative for cervical spine fracture. Electronically Signed   By: Marlan Palau M.D.   On: 11/28/2020 16:46   DG Hand Complete Left  Result Date: 11/28/2020 CLINICAL DATA:  Left hand pain after fall today. EXAM: LEFT HAND - COMPLETE 3+ VIEW COMPARISON:  None. FINDINGS: There is no evidence of fracture or dislocation. Mild degenerative changes seen involving the first carpometacarpal joint. Soft tissues are unremarkable. 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 No acute abnormality seen. Electronically Signed   By: Lupita Raider M.D.   On: 11/28/2020 15:46   CT Maxillofacial Wo Contrast  Result Date: 11/28/2020 CLINICAL DATA:  Fall.  Head trauma. EXAM: CT HEAD WITHOUT CONTRAST CT MAXILLOFACIAL WITHOUT CONTRAST CT CERVICAL SPINE WITHOUT CONTRAST TECHNIQUE: Multidetector CT imaging of the head, cervical spine, and maxillofacial structures were performed using the standard protocol without intravenous contrast. Multiplanar CT image reconstructions of the cervical spine and maxillofacial structures were also generated. COMPARISON:  None. FINDINGS: CT HEAD FINDINGS Brain: Generalized atrophy. Negative for acute infarct, hemorrhage, mass. Vascular: Negative for hyperdense vessel Skull: Negative for skull fracture Other: None CT MAXILLOFACIAL FINDINGS Osseous: Mildly displaced nasal bone fractures. No fracture of the nasal septum. No other facial fracture. Poor dentition  with numerous caries. Orbits: Negative for orbital mass or hematoma. Sinuses: Mucosal edema in the paranasal sinuses most prominent right  maxillary sinus. No air-fluid level. Small right mastoid effusion Soft tissues: Diffuse soft tissue swelling of the nose and forehead. CT CERVICAL SPINE FINDINGS Alignment: Normal Skull base and vertebrae: Negative for fracture Soft tissues and spinal canal: Atherosclerotic calcification in the carotid artery bilaterally. Disc levels: Disc degeneration and spurring C5-6 and C6-7 without significant stenosis. Upper chest: Lung apices clear bilaterally. Left-sided transvenous pacemaker. Other: None IMPRESSION: 1. No acute intracranial abnormality 2. Nasal bone fracture with overlying soft tissue swelling. No other facial fracture 3. Negative for cervical spine fracture. Electronically Signed   By: Marlan Palau M.D.   On: 11/28/2020 16:46    Procedures .Marland KitchenLaceration Repair  Date/Time: 11/28/2020 11:27 PM Performed by: Ralph Leyden A, PA-C Authorized by: Linwood Dibbles, PA-C   Consent:    Consent obtained:  Verbal   Consent given by:  Patient   Risks discussed:  Infection, need for additional repair, pain, poor cosmetic result and poor wound healing   Alternatives discussed:  No treatment and delayed treatment Universal protocol:    Procedure explained and questions answered to patient or proxy's satisfaction: yes     Relevant documents present and verified: yes     Test results available: yes     Imaging studies available: yes     Required blood products, implants, devices, and special equipment available: yes     Site/side marked: yes     Immediately prior to procedure, a time out was called: yes     Patient identity confirmed:  Verbally with patient Anesthesia:    Anesthesia method:  Local infiltration   Local anesthetic:  Lidocaine 1% WITH epi Laceration details:    Location:  Face   Face location:  Forehead   Length (cm):  2   Depth (mm):   3 Pre-procedure details:    Preparation:  Patient was prepped and draped in usual sterile fashion and imaging obtained to evaluate for foreign bodies Exploration:    Limited defect created (wound extended): no     Hemostasis achieved with:  Direct pressure   Imaging obtained comment:  CT   Imaging outcome: foreign body not noted     Wound extent: no areolar tissue violation noted, no fascia violation noted, no foreign bodies/material noted, no muscle damage noted, no nerve damage noted, no tendon damage noted, no underlying fracture noted and no vascular damage noted   Treatment:    Area cleansed with:  Povidone-iodine   Amount of cleaning:  Extensive   Irrigation solution:  Sterile saline   Irrigation method:  Pressure wash Skin repair:    Repair method:  Sutures   Suture size:  5-0   Suture material:  Prolene   Suture technique:  Simple interrupted   Number of sutures:  3 Approximation:    Approximation:  Close Repair type:    Repair type:  Intermediate Post-procedure details:    Dressing:  Non-adherent dressing   Procedure completion:  Tolerated well, no immediate complications .Marland KitchenLaceration Repair  Date/Time: 11/28/2020 11:28 PM Performed by: Linwood Dibbles, PA-C Authorized by: Linwood Dibbles, PA-C   Consent:    Consent obtained:  Verbal   Consent given by:  Patient   Risks discussed:  Infection, need for additional repair, pain, poor cosmetic result and poor wound healing   Alternatives discussed:  No treatment and delayed treatment Universal protocol:    Procedure explained and questions answered to patient or proxy's satisfaction: yes     Relevant documents present and verified: yes  Test results available: yes     Imaging studies available: yes     Required blood products, implants, devices, and special equipment available: yes     Site/side marked: yes     Immediately prior to procedure, a time out was called: yes     Patient identity confirmed:   Verbally with patient Anesthesia:    Anesthesia method:  Local infiltration   Local anesthetic:  Lidocaine 1% WITH epi Laceration details:    Location:  Lip   Lip location:  Upper exterior lip   Length (cm):  0.6   Depth (mm):  3 Pre-procedure details:    Preparation:  Patient was prepped and draped in usual sterile fashion and imaging obtained to evaluate for foreign bodies Exploration:    Hemostasis achieved with:  Direct pressure   Imaging obtained comment:  CT   Wound exploration: wound explored through full range of motion and entire depth of wound visualized     Wound extent: no foreign bodies/material noted, no muscle damage noted, no nerve damage noted, no tendon damage noted, no underlying fracture noted and no vascular damage noted     Contaminated: no   Treatment:    Area cleansed with:  Povidone-iodine   Amount of cleaning:  Extensive   Irrigation method:  Pressure wash Skin repair:    Repair method:  Sutures   Suture size:  5-0   Suture material:  Prolene   Suture technique:  Simple interrupted   Number of sutures:  1 Approximation:    Approximation:  Close   Vermilion border well-aligned: yes   Repair type:    Repair type:  Simple Post-procedure details:    Dressing:  Non-adherent dressing   Procedure completion:  Tolerated well, no immediate complications   Medications Ordered in ED Medications  Tdap (BOOSTRIX) injection 0.5 mL (0.5 mLs Intramuscular Given 11/28/20 2302)  lidocaine-EPINEPHrine (XYLOCAINE W/EPI) 2 %-1:200000 (PF) injection 10 mL (10 mLs Infiltration Given 11/28/20 2307)   ED Course  I have reviewed the triage vital signs and the nursing notes.  Pertinent labs & imaging results that were available during my care of the patient were reviewed by me and considered in my medical decision making (see chart for details).  Here for evaluation after mechanical fall PTA.  Unfortunately patient had to wait greater than 9 hours in the waiting room  prior to my evaluation. No syncope or presyncope symptoms.  He is ambulatory, neurovascularly intact.  Will update tetanus.    Imaging obtained from triage which I personally reviewed and interpreted does show nasal fracture however no acute intracranial abnormality, cervical injury.  He has normal musculoskeletal exam.  No midline C/T/L tenderness.  Does have stigmata of prior epistaxis however no active bleeding on exam.  No septal hematoma.  Wounds were thoroughly cleaned.  See procedure note.  Forehead laceration sutured with #3 sutures as well as #1 suture to left philtrum. Given dirty wound family is concerned about infection requesting antibiotics. Will dc home with Keflex. Encouraged outpatient FU, return for new or worsening symptoms  The patient has been appropriately medically screened and/or stabilized in the ED. I have low suspicion for any other emergent medical condition which would require further screening, evaluation or treatment in the ED or require inpatient management.  Patient is hemodynamically stable and in no acute distress.  Patient able to ambulate in department prior to ED.  Evaluation does not show acute pathology that would require ongoing or additional emergent interventions while  in the emergency department or further inpatient treatment.  I have discussed the diagnosis with the patient and answered all questions.  Pain is been managed while in the emergency department and patient has no further complaints prior to discharge.  Patient is comfortable with plan discussed in room and is stable for discharge at this time.  I have discussed strict return precautions for returning to the emergency department.  Patient was encouraged to follow-up with PCP/specialist refer to at discharge.     MDM Rules/Calculators/A&P                            Final Clinical Impression(s) / ED Diagnoses Final diagnoses:  Fall, initial encounter  Facial laceration, initial encounter  Lip  laceration, initial encounter  Closed fracture of nasal bone, initial encounter    Rx / DC Orders ED Discharge Orders          Ordered    cephALEXin (KEFLEX) 500 MG capsule  2 times daily        11/28/20 2325             Orah Sonnen A, PA-C 11/28/20 2331    Melene Plan, DO 11/29/20 1820

## 2020-11-28 NOTE — ED Provider Notes (Signed)
Emergency Medicine Provider Triage Evaluation Note  Travis [name] , a 80 y.o. male  was evaluated in triage.  Pt complains of fall. [place] the [place] when he fell forward onto his face. States he hit head and his nose hurts. Denies loc. C/o hand pain.  Review of Systems  Positive: Head injury, neck pain, hand pain Negative: loc  Physical Exam  BP (!) 146/69 (BP Location: Left Arm)   Pulse (!) 59   Temp (!) 97.5 F (36.4 C)   Resp 18   SpO2 99%  Gen:   Awake, no distress   Resp:  Normal effort  MSK:   Moves extremities without difficulty  Other:  Laceration to the forehead, ttp to the nose, mild ttp to the cervical spine, no facial droop, 5/5 strength to the bue/ble  Medical Decision Making  Medically screening exam initiated at 3:14 PM.  Appropriate orders placed.  Travis [name] was informed that the remainder of the evaluation will be completed by another provider, this initial triage assessment does not replace that evaluation, and the importance of remaining in the ED until their evaluation is complete.     Rayne Du 11/28/20 1515    Pollyann Savoy, MD 11/28/20 [phone]

## 2020-11-28 NOTE — Discharge Instructions (Addendum)
Sutures need to be removed in 5- 7 days. You have a total of #4.   #1 to your upper lip and #3 to your forehead  If your nose started to bleed use Afrin nasal spray and hold pressure. If bleeding doesn't stop in 1 hours return to the Emergency department for reassessment.   You may follow up with any plastic surgeon or Ear/nose/ throat surgeon for your broken nose

## 2020-11-28 NOTE — ED Triage Notes (Signed)
Pt presents to ED POV. Pt c/o lac to forehead. Pt pt reports that he had mechanical fall while running. Denies LOC, denies blood thinners. Large hematoma and lac to forehead. Bleeding controlled

## 2020-11-28 NOTE — ED Notes (Signed)
RN reviewed discharge instructions with pt. Pt verbalized understanding and had no further questions. VSS upon discharge.  

## 2020-12-02 ENCOUNTER — Other Ambulatory Visit: Payer: Self-pay | Admitting: Cardiovascular Disease

## 2020-12-06 ENCOUNTER — Emergency Department (HOSPITAL_BASED_OUTPATIENT_CLINIC_OR_DEPARTMENT_OTHER)
Admission: EM | Admit: 2020-12-06 | Discharge: 2020-12-06 | Disposition: A | Discharge status: Home / Self Care | Payer: Medicare Other | Attending: Emergency Medicine | Admitting: Emergency Medicine

## 2020-12-06 ENCOUNTER — Other Ambulatory Visit: Payer: Self-pay

## 2020-12-06 ENCOUNTER — Encounter (HOSPITAL_BASED_OUTPATIENT_CLINIC_OR_DEPARTMENT_OTHER): Payer: Self-pay | Admitting: *Deleted

## 2020-12-06 DIAGNOSIS — Z7984 Long term (current) use of oral hypoglycemic drugs: Secondary | ICD-10-CM | POA: Insufficient documentation

## 2020-12-06 DIAGNOSIS — Z87891 Personal history of nicotine dependence: Secondary | ICD-10-CM | POA: Insufficient documentation

## 2020-12-06 DIAGNOSIS — Z4802 Encounter for removal of sutures: Secondary | ICD-10-CM | POA: Diagnosis not present

## 2020-12-06 DIAGNOSIS — Z79899 Other long term (current) drug therapy: Secondary | ICD-10-CM | POA: Insufficient documentation

## 2020-12-06 DIAGNOSIS — I2510 Atherosclerotic heart disease of native coronary artery without angina pectoris: Secondary | ICD-10-CM | POA: Insufficient documentation

## 2020-12-06 DIAGNOSIS — Z7982 Long term (current) use of aspirin: Secondary | ICD-10-CM | POA: Insufficient documentation

## 2020-12-06 DIAGNOSIS — Z950 Presence of cardiac pacemaker: Secondary | ICD-10-CM | POA: Insufficient documentation

## 2020-12-06 DIAGNOSIS — I10 Essential (primary) hypertension: Secondary | ICD-10-CM | POA: Insufficient documentation

## 2020-12-06 DIAGNOSIS — E119 Type 2 diabetes mellitus without complications: Secondary | ICD-10-CM | POA: Insufficient documentation

## 2020-12-06 NOTE — ED Provider Notes (Signed)
[facility] EMERGENCY DEPT Provider Note   CSN: [id number] Arrival date & time: 12/06/20  7829     History Chief Complaint  Patient presents with   Suture / Staple Removal    Travis [name] is a 80 y.o. male.  HPI Patient is here for suture removal.  He had a mechanical fall 11\17.  He reports the wounds are healing well.  He is not having pain or drainage.    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   SSS (sick sinus syndrome) (HCC) 06/10/2016   Type II diabetes mellitus (HCC)     Patient Active Problem List   Diagnosis Date Noted   Left patella fracture 12/16/2017   CAD (coronary artery disease), native coronary artery 09/14/2016   Pacemaker 06/10/2016   Paroxysmal atrial fibrillation (HCC) 06/07/2016   Sinus pause 06/07/2016   Bradycardia 06/04/2016   Groin pain 06/29/2014   Piriformis syndrome of left side 10/30/2013    Past Surgical History:  Procedure Laterality Date   APPENDECTOMY     CARDIAC CATHETERIZATION  06/10/2016   CATARACT EXTRACTION W/ INTRAOCULAR LENS IMPLANT Right    CYSTOSCOPY W/ STONE MANIPULATION     INSERT / REPLACE / REMOVE PACEMAKER  06/10/2016   dual chamber permanent pacemaker   KNEE ARTHROSCOPY W/ MENISCECTOMY Bilateral    LAPAROSCOPIC CHOLECYSTECTOMY     LEFT HEART CATH AND CORONARY ANGIOGRAPHY N/A 06/10/2016   Procedure: Left Heart Cath and Coronary Angiography;  Surgeon: Dolores Patty, MD;  Location: [facility] INVASIVE CV LAB;  Service: Cardiovascular;  Laterality: N/A;   LITHOTRIPSY     PACEMAKER IMPLANT N/A 06/10/2016   Procedure: Pacemaker Implant;  Surgeon: Thurmon Fair, MD;  Location: [facility] INVASIVE CV LAB;  Service: Cardiovascular;  Laterality: N/A;   TONSILLECTOMY          Family History  Problem Relation Age of Onset   Colon cancer Father     Social History   Tobacco Use   Smoking status: Former    Packs/day: 0.50    Years: 25.00    Pack years: 12.50    Types: Cigarettes    Quit date: 89    Years since quitting: 39.9   Smokeless tobacco: Never  Vaping Use   Vaping Use: Never used  Substance Use Topics   Alcohol use: Yes    Comment: 06/10/2016 "might have 2 beers/month"   Drug use: No    Home Medications Prior to Admission medications   Medication Sig Start Date End Date Taking? Authorizing Provider  amLODipine (NORVASC) 5 MG tablet TAKE 1&1/2 TABLETS ONCE DAILY. 06/11/20   Lennette Bihari, MD  aspirin EC 81 MG tablet Take 81 mg by mouth daily.    [provider]  atorvastatin (LIPITOR) 40 MG tablet TAKE 1 TABLET ONCE DAILY. 04/10/20   Lennette Bihari, MD  Cholecalciferol (VITAMIN D3) 2000 units capsule Take 2,000 Units by mouth daily.    [provider]  COMBIGAN 0.2-0.5 % ophthalmic solution Place 1 drop into both eyes 2 (two) times daily.  03/13/16   [provider]  COVID-19 mRNA Vac-TriS, Pfizer, (PFIZER-BIONT COVID-19 VAC-TRIS) SUSP injection Inject into the muscle. 05/23/20   Judyann Munson, MD  ezetimibe (ZETIA) 10 MG tablet TAKE 1 TABLET ONCE DAILY. 04/10/20   Lennette Bihari, MD  hydrochlorothiazide (MICROZIDE) 12.5 MG capsule TAKE ONE CAPSULE EVERY DAY. 12/02/20   Lennette Bihari, MD  lisinopril (ZESTRIL) 20 MG tablet TAKE ONE TABLET BY MOUTH ONCE DAILY. 07/01/20   Lennette Bihari, MD  metFORMIN (GLUCOPHAGE) 500 MG tablet Take 500 mg by mouth 2 (two) times daily with a meal.     [provider]  predniSONE (DELTASONE) 20 MG tablet 2 tabs po daily x 3 days 11/13/20   Arby Barrette, MD    Allergies    Patient has no known allergies.  Review of Systems   Review of Systems Constitutional no fever no chills no malaise Physical Exam Updated Vital Signs BP 126/75 (BP Location: Right Arm)    Pulse 82   Temp 97.6 F (36.4 C)   Resp 16   Ht 5\' 11"  (1.803 m)   Wt 71.7 kg   SpO2 100%   BMI 22.04 kg/m   Physical Exam Constitutional:      Appearance: Normal appearance.  HENT:     Head:     Comments: Resolving bruising around the eyes and forehead.  3 clean sutures at the forehead and 1 clean suture under the nasal bridge Neurological:     General: No focal deficit present.     Mental Status: He is alert and oriented to person, place, and time.    ED Results / Procedures / Treatments   Labs (all labs ordered are listed, but only abnormal results are displayed) Labs Reviewed - No data to display  EKG None  Radiology No results found.  Procedures Procedures   Medications Ordered in ED Medications - No data to display  ED Course  I have reviewed the triage vital signs and the nursing notes.  Pertinent labs & imaging results that were available during my care of the patient were reviewed by me and considered in my medical decision making (see chart for details).    MDM Rules/Calculators/A&P                           4 total sutures removed by nursing staff.  Wounds are healing well.  Able to follow-up with outpatient provider and ongoing home wound care Final Clinical Impression(s) / ED Diagnoses Final diagnoses:  Visit for suture removal    Rx / DC Orders ED Discharge Orders     None        , MD 12/06/20 0945

## 2020-12-06 NOTE — ED Triage Notes (Signed)
Pt here for suture removal. Total of 3 on forehead and 1 above lip.

## 2020-12-06 NOTE — ED Notes (Signed)
Total of 4 sutures removed.

## 2020-12-10 ENCOUNTER — Ambulatory Visit: Payer: Medicare Other | Admitting: Cardiovascular Disease

## 2020-12-16 ENCOUNTER — Ambulatory Visit (INDEPENDENT_AMBULATORY_CARE_PROVIDER_SITE_OTHER): Payer: Medicare Other

## 2020-12-16 DIAGNOSIS — I495 Sick sinus syndrome: Secondary | ICD-10-CM | POA: Diagnosis not present

## 2020-12-17 LAB — CUP PACEART REMOTE DEVICE CHECK
Battery Remaining Longevity: 115 mo
Battery Voltage: 3 V
Brady Statistic AP VP Percent: 0.04 %
Brady Statistic AP VS Percent: 44.22 %
Brady Statistic AS VP Percent: 0.04 %
Brady Statistic AS VS Percent: 55.71 %
Brady Statistic RA Percent Paced: 46.29 %
Brady Statistic RV Percent Paced: 0.08 %
Date Time Interrogation Session: 20221205104028
Implantable Lead Implant Date: 20180530
Implantable Lead Implant Date: 20180530
Implantable Lead Location: 753859
Implantable Lead Location: 753860
Implantable Lead Model: 5076
Implantable Lead Model: 5076
Implantable Pulse Generator Implant Date: 20180530
Lead Channel Impedance Value: 285 Ohm
Lead Channel Impedance Value: 323 Ohm
Lead Channel Impedance Value: 380 Ohm
Lead Channel Impedance Value: 418 Ohm
Lead Channel Pacing Threshold Amplitude: 0.625 V
Lead Channel Pacing Threshold Amplitude: 0.625 V
Lead Channel Pacing Threshold Pulse Width: 0.4 ms
Lead Channel Pacing Threshold Pulse Width: 0.4 ms
Lead Channel Sensing Intrinsic Amplitude: 14 mV
Lead Channel Sensing Intrinsic Amplitude: 14 mV
Lead Channel Sensing Intrinsic Amplitude: 3.75 mV
Lead Channel Sensing Intrinsic Amplitude: 3.75 mV
Lead Channel Setting Pacing Amplitude: 1.5 V
Lead Channel Setting Pacing Amplitude: 2 V
Lead Channel Setting Pacing Pulse Width: 0.4 ms
Lead Channel Setting Sensing Sensitivity: 2 mV

## 2020-12-25 NOTE — Progress Notes (Signed)
Remote pacemaker transmission.   

## 2020-12-26 ENCOUNTER — Encounter: Payer: Self-pay | Admitting: Cardiovascular Disease

## 2020-12-26 ENCOUNTER — Other Ambulatory Visit: Payer: Self-pay

## 2020-12-26 ENCOUNTER — Ambulatory Visit (INDEPENDENT_AMBULATORY_CARE_PROVIDER_SITE_OTHER): Payer: Medicare Other | Admitting: Cardiovascular Disease

## 2020-12-26 VITALS — BP 120/76 | HR 82 | Ht 71.0 in | Wt 165.8 lb

## 2020-12-26 DIAGNOSIS — I358 Other nonrheumatic aortic valve disorders: Secondary | ICD-10-CM | POA: Diagnosis not present

## 2020-12-26 DIAGNOSIS — Z950 Presence of cardiac pacemaker: Secondary | ICD-10-CM

## 2020-12-26 DIAGNOSIS — I2510 Atherosclerotic heart disease of native coronary artery without angina pectoris: Secondary | ICD-10-CM

## 2020-12-26 DIAGNOSIS — I10 Essential (primary) hypertension: Secondary | ICD-10-CM | POA: Diagnosis not present

## 2020-12-26 DIAGNOSIS — I495 Sick sinus syndrome: Secondary | ICD-10-CM

## 2020-12-26 DIAGNOSIS — E785 Hyperlipidemia, unspecified: Secondary | ICD-10-CM

## 2020-12-26 NOTE — Patient Instructions (Signed)
Medication Instructions:  Your physician recommends that you continue on your current medications as directed. Please refer to the Current Medication list given to you today.  *If you need a refill on your cardiac medications before your next appointment, please call your pharmacy*   Testing/Procedures: Your physician has requested that you have an echocardiogram. Echocardiography is a painless test that uses sound waves to create images of your heart. It provides your doctor with information about the size and shape of your heart and how well your hearts chambers and valves are working. This procedure takes approximately one hour. There are no restrictions for this procedure. To be done in December 2023. This procedure is done at [address].     Follow-Up: At [facility], you and your health needs are our priority.  As part of our continuing mission to provide you with exceptional heart care, we have created designated Provider Care Teams.  These Care Teams include your primary Cardiologist (physician) and Advanced Practice Providers (APPs -  Physician Assistants and Nurse Practitioners) who all work together to provide you with the care you need, when you need it.  We recommend signing up for the patient portal called "MyChart".  Sign up information is provided on this After Visit Summary.  MyChart is used to connect with patients for Virtual Visits (Telemedicine).  Patients are able to view lab/test results, encounter notes, upcoming appointments, etc.  Non-urgent messages can be sent to your provider as well.   To learn more about what you can do with MyChart, go to [email or web address].    Your next appointment:   12 month(s)  The format for your next appointment:   In Person  Provider:   Nicki Guadalajara, MD

## 2020-12-26 NOTE — Progress Notes (Signed)
Cardiology Office Note    Date:  01/01/2021   ID:  Travis [name], Travis [name] 06/21/1940, MRN [id number]  PCP:  Haywood Pao, MD  Referring M.D.: Dr. Leeroy Cha  Cardiologist:  Shelva Majestic, MD    History of Present Illness:  Travis [name] is a 80 y.o. male who was initially referred through the courtesy of [place] for evaluation of lightheadedness and dizziness while running. He presents for a 55-monthfollow-up evaluation.  Mr. SMadolehas been active for his entire life.  He has a history of hypertension, hyperlipidemia, and has been diagnosed with type 2 diabetes mellitus for 14 years.  He exercises at least 6 days per week for 1-2 hours.  He does resistance training, spin classes, and typically runs anywhere from 5-6 miles at a 9-10 minute mile pace and has been doing so for many years.  His pulse typically runs in the low 30s.  Recently, he has begun to notice that he becomes dizzy when he starts to run.  This is associated with mild shortness of breath.  He denies any chest pain.  The symptoms have occurred since January.  He denies any frank syncope.  He denies the lightheaded symptoms when he is at rest and his symptoms only occur with significant activity.  When he does spin class he is unaware of any  symptoms.    When I saw for initial evaluation, his ECG revealed marked sinus bradycardia with ventricular rate averaging 37 bpm.  PR interval was 190 ms and QTc interval was 335 ms.  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  He had a 2/6 systolic murmur on physical exam, suggestive of possible aortic valve disease.  I scheduled him for a 2-D echo Doppler study which was done on 06/02/2016.  This showed an ejection fraction at 55-65%.  There was mild LVH.  He did not have any regional wall motion abnormalities.  His aortic valve was reported as structurally normal.  There was a mean gradient of 9 and a  peak gradient of 17.  There was mild MR and mild LA dilation.  His right atrium was mild moderately dilated.  I obtain a magnesium level, which was normal.  I scheduled him for an exercise nuclear stress test to assess both chronotropic competence to exercise as well as myocardial perfusion.  On his exercise portion.  His resting heart rate was 34 bpm, which rose to a maximum of 126/m.  He completed 2 minutes and 2 seconds into stage III of Bruce protocol.  He was able to augment his heart rate appropriately with exercise.  At the end of stage I reflective of good aerobic conditioning ventricular rate was 71, at the end of stage II 100, and in stage III maximum heart rate was 125.  He was entirely asymptomatic but had an exaggerated blood pressure response opted to 16/75 and at peak stress developed asymptomatic ST segment depression up to 3 mm in the inferior and inferolateral leads.  There was a very small mild distal anteroseptal defect which most likely was artifact.  There was no ischemia identified.  He has been wearing an event monitor.  We werereceivingdaily notifications demonstrating marked bradycardia with heart rates in the 30s.  These have been sinus rhythm.  He has been entirely asymptomatic.  He had developed sinus pauses up to 5 - 6 seconds.  He remained entirely asymptomatic and specifically denied  any dizziness or lightheadedness or chest pain.  He was unaware of sleep apnea but he does snore.    At  follow-up on 06/04/2016  I had extensive discussion with him, both concerning his ST segment changes on ECG during stress testing as well as his need for a permanent pacemaker.  Due to scheduling difficulties and since he remained asymptomatic, ultimately, both of these procedures were done on 06/10/2016.  Diagnostic catheterization revealed normal LV function.  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  There was mild 30 and 40% smooth  circumflex narrowings.  Later that same day in the afternoon he underwent successful insertion of a permanent pacemaker by Dr. 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  He tolerated both procedures well.  Following his pacemaker insertion, he has continued to run and has been without dizziness or lightheadedness. He saw Dr. Sallyanne Kuster on 09/15/2016 for pacemaker follow-up evaluation.  He had normal device function.  He is now scheduled to undergo remote download every 3 months and have yearly office visits with Dr. Sallyanne Kuster. His blood pressure at that time was elevated his blood pressure at that time was elevated He is now scheduled to undergo remote downloads every 3 months  He had normal device function.  And no longer experiences any dizziness or lightheadedness.  He saw Dr. Sallyanne Kuster on September 15, 2016 for pacemaker follow-up evaluation.   His blood pressure that day was elevated and his lisinopril was increased to 20 mg and he continued to be on amlodipine.  He feels his blood pressure has been better.  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  In early 2018 had been running approximately 30 miles per week and denied chest pain, PND orthopnea.   I added Zetia to his regimen of atorvastatin.  This is resulted in marked improvement in lipid status and blood work done by his primary physician on March 03, 2017 showed an LDL cholesterol at 31.  He is on lisinopril 20 mg and amlodipine 5 mg for hypertension.  He is on metformin for mild type 2 diabetes mellitus.  Recent hemoglobin A1c was 6.3.  He has had some mild knee discomfort and for this reason has elected not to participate in the nationals and running in June.   When I saw him in December 2019 he continued to remain asymptomatic.  He still runs but he had  fractured his left kneecap which had limit his is activity for some time.  He was unaware of any palpitations.  He denies any chest pressure PND orthopnea.  He underwent repeat laboratory on December 17, 2017 which showed excellent lipid studies with total cholesterol 119, HDL 67, LDL 43, and triglycerides 45.    I evaluated him in a telemedicine visit on June 28, 2018 and he continued to feel well. He now sees Dr. Osborne Casco for his primary care.  He continues to be active and is running 4 to 5 miles per day at least 6 days/week.  He denies any chest tightness or pressure.  He denies any lightheadedness.  He denies PND orthopnea.  A pacemaker remote check on June 2 which revealed stable pacemaker function with stable lead measurements and heart rate histogram.  There apparently was 1 event consistent with  a 12 beats of SVT.  The patient remains asymptomatic.   He saw Dr. Sallyanne Kuster in September 2020.  Presently he remains asymptomatic.  He is running 6 days/week for at least 5 miles a day at a 10 to 11 mile pace.  Blood pressure at home has been slightly elevated in the 409W systolically.  He denies palpitations.  He denies shortness of breath.  He denies leg swelling.    I saw him on December 26, 2018.  His blood pressure was elevated and at home had been running in the 119 systolically and on repeat by me in the office had increased to 168/88.  I recommended titration of amlodipine 7.5 mg daily if blood pressure continues to be increased after 2 weeks of treatment further titration to 10 mg was recommended.  When I saw him in April 2021 he felt well.  His blood pressure had significantly improved.   He continues to run most days at least 5 miles at a time typically at 11 to 12-minute pace.  He underwent an echo Doppler study in January 2021 which showed an EF of 55 to 60% with grade 1 diastolic dysfunction.  There was mild to moderate aortic valve sclerosis without stenosis.  There was moderate dilation of his  left atrium with mild dilation of his right atrium.  When I last saw him he did have a suggestion of a possible squeaky friction rub which was positional.  His pericardium was normal with no effusion.  He has noted some swelling in his ankles.  I last saw him on December 04, 2019 and since his prior evaluation he continued to be active.     Three weeks ago he ran a half marathon at age 72 year old and finished in first place at a time of 2 hours and 57 minutes.  He continues to have follow-up pacemaker checks remotely had seen Dr. Sallyanne Kuster on October 23, 2019.  Pacemaker interrogation demonstrated normal device function with anticipated generator longevity of about 10.8 years.  He denies chest pain or shortness of breath.  He had had laboratory done by Dr. Osborne Casco.  Labs were stable although initial PSA was mildly increased at 4.8 which he states on follow-up had improved.  He continues to be on triple drug therapy with blood pressure control.  Amlodipine 5 mg, hydrochlorothiazide 12.5 mg, lisinopril 20 mg.  He is on atorvastatin 40 mg and Zetia 10 mg for hyperlipidemia.  He continues to be on Metformin 500 mg twice a day for diabetes mellitus.    Mr. Calvin was evaluated by Dr. Sallyanne Kuster on October 28, 2020 and he had normal pacemaker device function.  Only, he continues to feel well.  He was running consistently but approximately 1 month ago he had a fall and as result has not been running as he had in the past.  Denies any chest pain or exertional dyspnea.  He is still planning to run in several 5 and 10K races.  He denies any lightheadedness.  He is unaware of palpitations.  He presents for evaluation.  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   SSS (sick  sinus syndrome) ([facility]) 06/10/2016   Type II diabetes mellitus ([facility])  Past Surgical History:  Procedure Laterality Date   APPENDECTOMY     CARDIAC CATHETERIZATION  06/10/2016   CATARACT EXTRACTION W/ INTRAOCULAR LENS IMPLANT Right    CYSTOSCOPY W/ STONE MANIPULATION     INSERT / REPLACE / REMOVE PACEMAKER  06/10/2016   dual chamber permanent pacemaker   KNEE ARTHROSCOPY W/ MENISCECTOMY Bilateral    LAPAROSCOPIC CHOLECYSTECTOMY     LEFT HEART CATH AND CORONARY ANGIOGRAPHY N/A 06/10/2016   Procedure: Left Heart Cath and Coronary Angiography;  Surgeon: Jolaine Artist, MD;  Location: [facility];  Service: Cardiovascular;  Laterality: N/A;   LITHOTRIPSY     PACEMAKER IMPLANT N/A 06/10/2016   Procedure: Pacemaker Implant;  Surgeon: Sanda Klein, MD;  Location: [facility];  Service: Cardiovascular;  Laterality: N/A;   TONSILLECTOMY      Current Medications: Outpatient Medications Prior to Visit  Medication Sig Dispense Refill   amLODipine (NORVASC) 5 MG tablet TAKE 1&1/2 TABLETS ONCE DAILY. 135 tablet 1   aspirin EC 81 MG tablet Take 81 mg by mouth daily.     atorvastatin (LIPITOR) 40 MG tablet TAKE 1 TABLET ONCE DAILY. 90 tablet 3   Cholecalciferol (VITAMIN D3) 2000 units capsule Take 2,000 Units by mouth daily.     COMBIGAN 0.2-0.5 % ophthalmic solution Place 1 drop into both eyes 2 (two) times daily.      COVID-19 mRNA Vac-TriS, Pfizer, (PFIZER-BIONT COVID-19 VAC-TRIS) SUSP injection Inject into the muscle. 0.3 mL 0   ezetimibe (ZETIA) 10 MG tablet TAKE 1 TABLET ONCE DAILY. 90 tablet 3   hydrochlorothiazide (MICROZIDE) 12.5 MG capsule TAKE ONE CAPSULE EVERY DAY. 90 capsule 1   lisinopril (ZESTRIL) 20 MG tablet TAKE ONE TABLET BY MOUTH ONCE DAILY. 90 tablet 3   metFORMIN (GLUCOPHAGE) 500 MG tablet Take 500 mg by mouth 2 (two) times daily with a meal.      predniSONE (DELTASONE) 20 MG tablet 2 tabs po daily x 3 days 6 tablet 0   No facility-administered  medications prior to visit.     Allergies:   Patient has no known allergies.   Social History   Socioeconomic History   Marital status: Married    Spouse name: Not on file   Number of children: Not on file   Years of education: Not on file   Highest education level: Not on file  Occupational History   Not on file  Tobacco Use   Smoking status: Former    Packs/day: 0.50    Years: 25.00    Pack years: 12.50    Types: Cigarettes    Quit date: 33    Years since quitting: 39.9   Smokeless tobacco: Never  Vaping Use   Vaping Use: Never used  Substance and Sexual Activity   Alcohol use: Yes    Comment: 06/10/2016 "might have 2 beers/month"   Drug use: No   Sexual activity: Not on file  Other Topics Concern   Not on file  Social History Narrative   Not on file   Social Determinants of Health   Financial Resource Strain: Not on file  Food Insecurity: Not on file  Transportation Needs: Not on file  Physical Activity: Not on file  Stress: Not on file  Social Connections: Not on file    Social history is notable in that he was born in [place].  He's been married for over 64 years.  He has 2 children, 7 grandchildren, and one great-grandchild.  He has a Oceanographer  degree in lead engineering.  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  He is retired.  He drinks occasional beer.  He does not smoke.   Family History:  The patient's family history includes Colon cancer in his father..  He has a brother age 20 and a sister age 46, who are healthy.   ROS General: Negative; No fevers, chills, or night sweats;  HEENT: Negative; No changes in vision or hearing, sinus congestion, difficulty swallowing Pulmonary: Negative; No cough, wheezing, shortness of breath, hemoptysis Cardiovascular:  See HPI GI: Negative; No nausea, vomiting, diarrhea, or abdominal pain GU: Negative; No dysuria, hematuria, or difficulty voiding Musculoskeletal: Left knee  discomfort Hematologic/Oncology: Negative; no easy bruising, bleeding Endocrine: Negative; no heat/cold intolerance; no diabetes Neuro: Negative; no changes in balance, headaches Skin: Negative; No rashes or skin lesions Psychiatric: Negative; No behavioral problems, depression Sleep: Positive for snoring, no daytime sleepiness, hypersomnolence, bruxism, restless legs, hypnogognic hallucinations, no cataplexy Other comprehensive 14 point system review is negative.   PHYSICAL EXAM:   VS:  BP 120/76    Pulse 82    Ht '5\' 11"'  (1.803 m)    Wt 165 lb 12.8 oz (75.2 kg)    SpO2 99%    BMI 23.12 kg/m     Repeat blood pressure by me was 118/70  Wt Readings from Last 3 Encounters:  12/26/20 165 lb 12.8 oz (75.2 kg)  12/06/20 158 lb (71.7 kg)  11/28/20 155 lb (70.3 kg)    General: Alert, oriented, no distress.  Skin: normal turgor, no rashes, warm and dry HEENT: Normocephalic, atraumatic. Pupils equal round and reactive to light; sclera anicteric; extraocular muscles intact;  Nose without nasal septal hypertrophy Mouth/Parynx benign; Mallinpatti scale 2 Neck: No JVD, no carotid bruits; normal carotid upstroke Lungs: clear to ausculatation and percussion; no wheezing or rales Chest wall: without tenderness to palpitation Heart: PMI not displaced, RRR, s1 s2 normal, 1/6 systolic murmur, no diastolic murmur, no rubs, gallops, thrills, or heaves Abdomen: soft, nontender; no hepatosplenomehaly, BS+; abdominal aorta nontender and not dilated by palpation. Back: no CVA tenderness Pulses 2+ Musculoskeletal: full range of motion, normal strength, no joint deformities Extremities: no clubbing cyanosis or edema, Homan's sign negative  Neurologic: grossly nonfocal; Cranial nerves grossly wnl Psychologic: Normal mood and affect    Studies/Labs Reviewed:   December 26, 2020 ECG (independently read by me): Atrial paced, PR 276 msec, nonspecific T abnormality  December 04, 2019 ECG (independently  read by me): Atrially paced rhythm at 60 bpm with prolonged AV conduction with a PR interval of 234 ms.  No ectopy.  April 2021 ECG (independently read by me): Atrially paced at 55 bpm.  PR interval 218 ms.  No ectopy.  December 2020 ECG (independently read by me): Atrially paced rhythm at 69 bpm with prolonged AV conduction, PR interval 222 ms.  LVH with repolarization changes.  December 2019 ECG (independently read by me): Atrially paced rhythm with PVCs and transient ventricular bigeminy.  Prolonged AV conduction with PR interval 230 ms.  May 2019 ECG (independently read by me): Atrial paced rhythm at 51 bpm.  Prolonged AV conduction with a PR interval of 242 ms.  QTc interval 383 ms.  November 24, 2016 EKG:  EKG is ordered today.  A paced rhythm at 51 bpm.  PR interval 222 ms.  Mild LVH.  QTc interval 372 ms  06/16/2016 ECG (independently read by me): Atrially paced rhythm at 50 bpm with  prolonged AV conduction with a PR interval at 214 ms.  06/04/2016 ECG (independently read by me): Marked sinus bradycardia with possible low atrial involvement.  PR interval 166 ms, QTc interval 335 ms.  Ventricular rate 36.  05/20/2016 ECG (independently read by me): Marked sinus bradycardia with sinus arrhythmia, ventricular rate in the 40s to upper 30s, average 37.  PR interval 190 ms.  QTc interval 335 ms.  No ST segment changes.  Recent Labs: BMP Latest Ref Rng & Units 12/17/2017 03/03/2017 11/27/2016  Glucose 65 - 99 mg/dL 135(H) 119(H) 108(H)  BUN 8 - 27 mg/dL '18 22 23  ' Creatinine 0.76 - 1.27 mg/dL 1.01 1.13 1.12  BUN/Creat Ratio 10 - '24 18 19 21  ' Sodium 134 - 144 mmol/L 141 140 138  Potassium 3.5 - 5.2 mmol/L 4.5 4.7 4.2  Chloride 96 - 106 mmol/L 102 106 98  CO2 20 - 29 mmol/L '23 23 26  ' Calcium 8.6 - 10.2 mg/dL 10.2 10.2 10.2     Hepatic Function Latest Ref Rng & Units 12/17/2017 03/03/2017 11/17/2016  Total Protein 6.0 - 8.5 g/dL 6.5 6.4 7.1  Albumin 3.5 - 4.8 g/dL 4.6 4.5 4.8  AST 0 - 40  IU/L '17 19 20  ' ALT 0 - 44 IU/L '21 21 19  ' Alk Phosphatase 39 - 117 IU/L 77 62 82  Total Bilirubin 0.0 - 1.2 mg/dL 1.1 1.2 1.3(H)    CBC Latest Ref Rng & Units 06/04/2016  WBC 3.4 - 10.8 x10E3/uL 7.1  Hemoglobin 13.0 - 17.7 g/dL 14.2  Hematocrit 37.5 - 51.0 % 41.9  Platelets 150 - 379 x10E3/uL 260   Lab Results  Component Value Date   MCV 96 06/04/2016   No results found for: TSH Lab Results  Component Value Date   HGBA1C 7.7 (H) 12/17/2017     BNP No results found for: BNP  ProBNP No results found for: PROBNP   Lipid Panel     Component Value Date/Time   CHOL 119 12/17/2017 0839   TRIG 45 12/17/2017 0839   HDL 67 12/17/2017 0839   CHOLHDL 1.8 12/17/2017 0839   LDLCALC 43 12/17/2017 0839     RADIOLOGY: CUP PACEART REMOTE DEVICE CHECK  Result Date: 12/17/2020 Scheduled remote reviewed. Normal device function.  1 NSVT, 20 beats Next remote 91 days. LR    Additional studies/ records that were reviewed today include:  I reviewed the recent records from [facility]. I have reviewed his 2-D echo Doppler study, exercise Myoview study, laboratory, as well as daily  Monitors with critical notification from the event monitor prior to the insertion of the pacemaker.  I personally reviewed the catheterization findings, as well as his pacemaker implantation report.  Subsequent office visits with Dr. Sallyanne Kuster have been reviewed as well as recent laboratory.   ASSESSMENT:    1. Coronary artery disease involving native coronary artery of native heart without angina pectoris   2. Essential hypertension   3. Aortic valve sclerosis   4. Hyperlipidemia with target LDL less than 70   5. SSS (sick sinus syndrome) (HCC)   6. Pacemaker     PLAN:  Mr. Travis [name] is a very pleasant, young appearing active 80 year-old gentleman who has a long-standing history of significant exercise and has been exercising for at least 1-2 hours per day 6 days per week.  When I initially saw him he was  running 5 to 6 miles at a time and was doing spin class.  He underwent permanent  pacemaker for severe sinus bradycardia, periods of junctional escape and sinus pauses up to 6 seconds in duration on an event monitor.  Prior to his pacemaker cardiac catheterization revealed a 60 to 70% calcified LAD stenosis with normal LV function.  Subsequently, he has continued to be asymptomatic.  We have been very aggressive with lipid-lowering therapy and LDL cholesterol have been running in the 40s.  Laboratory from October 06, 2018 showed  a total cholesterol of 103, HDL 52, LDL 42, and triglycerides 46 on his regimen consisting of atorvastatin 40 mg and Zetia 10 mg.  Laboratory in September 2021 was stable with the exception of mild PSA elevation and mild hemoglobin A1c elevation.  Last month, he had a fall and went to an ER evaluation did not reveal any head neck fractures.  His blood pressure today is stable on his regimen consisting of lisinopril 20 mg, HCTZ 12.5 mg, and amlodipine 7.5 mg daily.  He continues to be on atorvastatin 40 mg and Zetia 10 mg with most recent LDL cholesterol at 46 in October 2022.  He is on metformin for type 2 diabetes mellitus.  He has normal pacemaker function.  His last echo Doppler study was in January 2021 which showed normal LV function with grade 1 diastolic dysfunction and mild to moderate aortic valve sclerosis without stenosis.  I have recommended that prior to his next visit with me in 1 year he undergo a follow-up echo Doppler assessment.  He will follow-up with Dr. Sallyanne Kuster for his pacemaker.  We will be seeing Dr. Osborne Casco for his primary care.   Medication Adjustments/Labs and Tests Ordered: Current medicines are reviewed at length with the patient today.  Concerns regarding medicines are outlined above.  Medication changes, Labs and Tests ordered today are listed in the Patient Instructions below. Patient Instructions  Medication Instructions:  Your physician recommends  that you continue on your current medications as directed. Please refer to the Current Medication list given to you today.  *If you need a refill on your cardiac medications before your next appointment, please call your pharmacy*   Testing/Procedures: Your physician has requested that you have an echocardiogram. Echocardiography is a painless test that uses sound waves to create images of your heart. It provides your doctor with information about the size and shape of your heart and how well your hearts chambers and valves are working. This procedure takes approximately one hour. There are no restrictions for this procedure. To be done in December 2023. This procedure is done at [address].     Follow-Up: At [facility], you and your health needs are our priority.  As part of our continuing mission to provide you with exceptional heart care, we have created designated Provider Care Teams.  These Care Teams include your primary Cardiologist (physician) and Advanced Practice Providers (APPs -  Physician Assistants and Nurse Practitioners) who all work together to provide you with the care you need, when you need it.  We recommend signing up for the patient portal called "MyChart".  Sign up information is provided on this After Visit Summary.  MyChart is used to connect with patients for Virtual Visits (Telemedicine).  Patients are able to view lab/test results, encounter notes, upcoming appointments, etc.  Non-urgent messages can be sent to your provider as well.   To learn more about what you can do with MyChart, go to [email or web address].    Your next appointment:   12 month(s)  The format for your  next appointment:   In Person  Provider:   Shelva Majestic, MD    Signed, Shelva Majestic, MD  01/01/2021 6:26 PM    [facility] [address], [place], [place], [place]  [address] Phone: [phone]

## 2020-12-27 DIAGNOSIS — S022XXA Fracture of nasal bones, initial encounter for closed fracture: Secondary | ICD-10-CM | POA: Insufficient documentation

## 2020-12-27 DIAGNOSIS — H6122 Impacted cerumen, left ear: Secondary | ICD-10-CM | POA: Insufficient documentation

## 2021-01-01 ENCOUNTER — Encounter: Payer: Self-pay | Admitting: Cardiovascular Disease

## 2021-01-24 ENCOUNTER — Ambulatory Visit: Payer: Medicare Other | Admitting: Podiatry

## 2021-03-17 ENCOUNTER — Ambulatory Visit (INDEPENDENT_AMBULATORY_CARE_PROVIDER_SITE_OTHER): Payer: Medicare Other

## 2021-03-17 DIAGNOSIS — I495 Sick sinus syndrome: Secondary | ICD-10-CM | POA: Diagnosis not present

## 2021-03-18 LAB — CUP PACEART REMOTE DEVICE CHECK
Battery Remaining Longevity: 112 mo
Battery Voltage: 3 V
Brady Statistic AP VP Percent: 0.04 %
Brady Statistic AP VS Percent: 43.4 %
Brady Statistic AS VP Percent: 0.03 %
Brady Statistic AS VS Percent: 56.53 %
Brady Statistic RA Percent Paced: 44.66 %
Brady Statistic RV Percent Paced: 0.07 %
Date Time Interrogation Session: 20230306020243
Implantable Lead Implant Date: 20180530
Implantable Lead Implant Date: 20180530
Implantable Lead Location: 753859
Implantable Lead Location: 753860
Implantable Lead Model: 5076
Implantable Lead Model: 5076
Implantable Pulse Generator Implant Date: 20180530
Lead Channel Impedance Value: 285 Ohm
Lead Channel Impedance Value: 323 Ohm
Lead Channel Impedance Value: 361 Ohm
Lead Channel Impedance Value: 418 Ohm
Lead Channel Pacing Threshold Amplitude: 0.625 V
Lead Channel Pacing Threshold Amplitude: 0.625 V
Lead Channel Pacing Threshold Pulse Width: 0.4 ms
Lead Channel Pacing Threshold Pulse Width: 0.4 ms
Lead Channel Sensing Intrinsic Amplitude: 13.375 mV
Lead Channel Sensing Intrinsic Amplitude: 13.375 mV
Lead Channel Sensing Intrinsic Amplitude: 4 mV
Lead Channel Sensing Intrinsic Amplitude: 4 mV
Lead Channel Setting Pacing Amplitude: 1.5 V
Lead Channel Setting Pacing Amplitude: 2 V
Lead Channel Setting Pacing Pulse Width: 0.4 ms
Lead Channel Setting Sensing Sensitivity: 2 mV

## 2021-03-24 ENCOUNTER — Other Ambulatory Visit: Payer: Self-pay | Admitting: Cardiovascular Disease

## 2021-03-28 NOTE — Progress Notes (Signed)
Remote pacemaker transmission.   

## 2021-04-07 ENCOUNTER — Ambulatory Visit (INDEPENDENT_AMBULATORY_CARE_PROVIDER_SITE_OTHER): Payer: Medicare Other | Admitting: Podiatry

## 2021-04-07 ENCOUNTER — Other Ambulatory Visit: Payer: Self-pay

## 2021-04-07 ENCOUNTER — Encounter: Payer: Self-pay | Admitting: Podiatry

## 2021-04-07 DIAGNOSIS — M2012 Hallux valgus (acquired), left foot: Secondary | ICD-10-CM

## 2021-04-07 DIAGNOSIS — L84 Corns and callosities: Secondary | ICD-10-CM | POA: Diagnosis not present

## 2021-04-07 DIAGNOSIS — M79674 Pain in right toe(s): Secondary | ICD-10-CM | POA: Diagnosis not present

## 2021-04-07 DIAGNOSIS — B351 Tinea unguium: Secondary | ICD-10-CM

## 2021-04-07 DIAGNOSIS — E119 Type 2 diabetes mellitus without complications: Secondary | ICD-10-CM

## 2021-04-07 DIAGNOSIS — M79675 Pain in left toe(s): Secondary | ICD-10-CM | POA: Diagnosis not present

## 2021-04-07 DIAGNOSIS — M2011 Hallux valgus (acquired), right foot: Secondary | ICD-10-CM

## 2021-04-07 DIAGNOSIS — E118 Type 2 diabetes mellitus with unspecified complications: Secondary | ICD-10-CM | POA: Diagnosis not present

## 2021-04-12 NOTE — Progress Notes (Signed)
ANNUAL DIABETIC FOOT EXAM ? ?Subjective: ?Travis [name] presents today for annual diabetic foot examination. ? ?Patient relates 15 year h/o diabetes. ? ?Patient denies any h/o foot wounds. ? ?Patient denies any numbness, tingling, burning, or pins/needle sensation in feet. ? ?Last HgA1c was 6.1%. Patient does not monitor blood glucose daily. ? ?Risk factors: diabetes, HTN, CAD, hyperlipidemia, h/o tobacco use in remission. ? ?Tisovec, Fransico Him, MD is patient's PCP. Last visit was December 06, 2020. ? ?Past Medical History:  ?Diagnosis Date  ? Heart murmur   ? "an innocent one" (06/10/2016)  ? History of blood transfusion ~ 1956  ? "while I was in hospital; don't remember why"  ? History of kidney stones   ? Hyperlipidemia   ? Hypertension   ? 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  ? SSS (sick sinus syndrome) ([place]) 06/10/2016  ? Type II diabetes mellitus ([place])   ? ?Patient Active Problem List  ? Diagnosis Date Noted  ? Left patella fracture 12/16/2017  ? CAD (coronary artery disease), native coronary artery 09/14/2016  ? Pacemaker 06/10/2016  ? Paroxysmal atrial fibrillation ([place]) 06/07/2016  ? Sinus pause 06/07/2016  ? Bradycardia 06/04/2016  ? Groin pain 06/29/2014  ? Piriformis syndrome of left side 10/30/2013  ? ?Past Surgical History:  ?Procedure Laterality Date  ? APPENDECTOMY    ? CARDIAC CATHETERIZATION  06/10/2016  ? CATARACT EXTRACTION W/ INTRAOCULAR LENS IMPLANT Right   ? CYSTOSCOPY W/ STONE MANIPULATION    ? INSERT / REPLACE / REMOVE PACEMAKER  06/10/2016  ? dual chamber permanent pacemaker  ? KNEE ARTHROSCOPY W/ MENISCECTOMY Bilateral   ? LAPAROSCOPIC CHOLECYSTECTOMY    ? LEFT HEART CATH AND CORONARY ANGIOGRAPHY N/A 06/10/2016  ? Procedure: Left Heart Cath and Coronary Angiography;  Surgeon: Jolaine Artist, MD;  Location: [facility];  Service: Cardiovascular;  Laterality: N/A;  ? LITHOTRIPSY    ? PACEMAKER IMPLANT N/A 06/10/2016  ? Procedure: Pacemaker Implant;  Surgeon: Sanda Klein, MD;  Location: [facility];  Service: Cardiovascular;  Laterality: N/A;  ? TONSILLECTOMY    ? ?Current Outpatient Medications on File Prior to Visit  ?Medication Sig Dispense Refill  ? Blood Glucose Monitoring Suppl (ONETOUCH VERIO FLEX SYSTEM) w/Device KIT Use to test blood sugars 2-3x a week    ? glucose blood (ONETOUCH VERIO) test strip Use to test blood sugars 2-3x a week E11.9    ? lisinopril (ZESTRIL) 20 MG tablet Take 1 tablet by mouth daily.    ? amLODipine (NORVASC) 10 MG tablet SMARTSIG:1 By Mouth    ? amLODipine (NORVASC) 5 MG tablet TAKE 1&1/2 TABLETS ONCE DAILY. 135 tablet 1  ? aspirin EC 81 MG tablet Take 81 mg by mouth daily.    ? atorvastatin (LIPITOR) 10 MG tablet SMARTSIG:1 By Mouth    ? atorvastatin (LIPITOR) 40 MG tablet TAKE 1 TABLET ONCE DAILY. 90 tablet 3  ? Cholecalciferol (VITAMIN D3) 2000 units capsule Take 2,000 Units by mouth daily.    ? clobetasol ointment (TEMOVATE) 0.93 % Apply 1 application. topically 2 (two) times daily.    ? COMBIGAN 0.2-0.5 % ophthalmic solution Place 1 drop into both eyes 2 (two) times daily.     ? COVID-19 mRNA Vac-TriS, Pfizer, (PFIZER-BIONT COVID-19 VAC-TRIS) SUSP injection Inject into the muscle. 0.3 mL 0  ? ezetimibe (ZETIA) 10 MG tablet TAKE 1 TABLET ONCE DAILY. 90 tablet 3  ? hydrochlorothiazide (MICROZIDE) 12.5 MG capsule TAKE ONE CAPSULE EVERY DAY. 90 capsule 1  ?  lisinopril (ZESTRIL) 20 MG tablet TAKE ONE TABLET BY MOUTH ONCE DAILY. 90 tablet 3  ? metFORMIN (GLUCOPHAGE) 500 MG tablet Take 500 mg by mouth 2 (two) times daily with a meal.     ? metFORMIN (GLUCOPHAGE) 500 MG tablet Take by mouth.    ? mupirocin ointment (BACTROBAN) 2 % SMARTSIG:sparingly Topical Twice Daily    ? predniSONE (DELTASONE) 20 MG tablet 2 tabs po daily x 3 days 6 tablet 0  ? ?No current facility-administered medications on file prior to visit.  ?  ?No Known Allergies ?Social History  ? ?Occupational History  ? Not on file  ?Tobacco Use  ? Smoking status: Former  ?   Packs/day: 0.50  ?  Years: 25.00  ?  Pack years: 12.50  ?  Types: Cigarettes  ?  Quit date: 50  ?  Years since quitting: 40.2  ? Smokeless tobacco: Never  ?Vaping Use  ? Vaping Use: Never used  ?Substance and Sexual Activity  ? Alcohol use: Yes  ?  Comment: 06/10/2016 "might have 2 beers/month"  ? Drug use: No  ? Sexual activity: Not on file  ? ?Family History  ?Problem Relation Age of Onset  ? Colon cancer Father   ? ?Immunization History  ?Administered Date(s) Administered  ? PFIZER Comirnaty(Gray Top)Covid-19 Tri-Sucrose Vaccine 05/23/2020  ? PFIZER(Purple Top)SARS-COV-2 Vaccination 01/21/2019, 02/11/2019, 11/07/2019, 10/30/2020  ? Tdap 09/21/2017, 11/28/2020  ?  ? ?Review of Systems: Negative except as noted in the HPI.  ? ?Objective: ?There were no vitals filed for this visit. ? ?Travis [name] is a pleasant 81 y.o. male in NAD. AAO X 3. ? ?Vascular Examination: ?CFT <3 seconds b/l LE. Palpable DP pulse(s) b/l LE. Palpable PT pulse(s) b/l LE. Pedal hair absent. No pain with calf compression b/l. Lower extremity skin temperature gradient within normal limits. No edema noted b/l LE. Varicosities present b/l. No cyanosis or clubbing noted b/l LE. ? ?Dermatological Examination: ?Pedal skin thin, shiny and atrophic b/l LE. No open wounds b/l LE. No interdigital macerations noted b/l LE. Toenails 1-5 b/l elongated, discolored, dystrophic, thickened, crumbly with subungual debris and tenderness to dorsal palpation. Hyperkeratotic lesion(s) submet head 5 b/l.  No erythema, no edema, no drainage, no fluctuance. ? ?Neurological Examination: ?Protective sensation intact 5/5 intact bilaterally with 10g monofilament b/l. Vibratory sensation intact b/l. Proprioception intact bilaterally. ? ?Musculoskeletal Examination: ?Normal muscle strength 5/5 to all lower extremity muscle groups bilaterally. Wearing New Balance sneakers today. HAV with bunion deformity noted b/l LE.Marland Kitchen No pain, crepitus or joint limitation noted with ROM  b/l LE.  Patient ambulates independently without assistive aids. ? ?Footwear Assessment: ?Does the patient wear appropriate shoes? Yes. ?Does the patient need inserts/orthotics? No. ? ?Assessment: ?1. Pain due to onychomycosis of toenails of both feet   ?2. Callus   ?3. Hallux valgus, acquired, bilateral   ?4. Type 2 diabetes mellitus with complication, without long-term current use of insulin ([place])   ?5. Encounter for diabetic foot exam ([place])   ?  ?ADA Risk Categorization: ?Low Risk :  ?Patient has all of the following: ?Intact protective sensation ?No prior foot ulcer  ?No severe deformity ?Pedal pulses present ? ?Plan: ?-Patient was evaluated and treated. All patient's and/or POA's questions/concerns answered on today's visit. ?-Diabetic foot examination performed today. ?-Continue foot and shoe inspections daily. Monitor blood glucose per PCP/Endocrinologist's recommendations. ?-Mycotic toenails 1-5 bilaterally were debrided in length and girth with sterile nail nippers and dremel without incident. ?-Callus(es) submet head 5 b/l  pared utilizing sterile scalpel blade without complication or incident. Total number debrided =2. ?-Patient/POA to call should there be question/concern in the interim. ?Return in about 3 months (around 07/08/2021). ? ?Marzetta Board, DPM ?

## 2021-05-16 ENCOUNTER — Other Ambulatory Visit: Payer: Self-pay | Admitting: Cardiovascular Disease

## 2021-06-16 ENCOUNTER — Ambulatory Visit (INDEPENDENT_AMBULATORY_CARE_PROVIDER_SITE_OTHER): Payer: Medicare Other

## 2021-06-16 DIAGNOSIS — I495 Sick sinus syndrome: Secondary | ICD-10-CM

## 2021-06-17 LAB — CUP PACEART REMOTE DEVICE CHECK
Battery Remaining Longevity: 111 mo
Battery Voltage: 3 V
Brady Statistic AP VP Percent: 0.08 %
Brady Statistic AP VS Percent: 49.96 %
Brady Statistic AS VP Percent: 0.04 %
Brady Statistic AS VS Percent: 49.92 %
Brady Statistic RA Percent Paced: 50.51 %
Brady Statistic RV Percent Paced: 0.12 %
Date Time Interrogation Session: 20230604202033
Implantable Lead Implant Date: 20180530
Implantable Lead Implant Date: 20180530
Implantable Lead Location: 753859
Implantable Lead Location: 753860
Implantable Lead Model: 5076
Implantable Lead Model: 5076
Implantable Pulse Generator Implant Date: 20180530
Lead Channel Impedance Value: 304 Ohm
Lead Channel Impedance Value: 342 Ohm
Lead Channel Impedance Value: 380 Ohm
Lead Channel Impedance Value: 418 Ohm
Lead Channel Pacing Threshold Amplitude: 0.625 V
Lead Channel Pacing Threshold Amplitude: 0.75 V
Lead Channel Pacing Threshold Pulse Width: 0.4 ms
Lead Channel Pacing Threshold Pulse Width: 0.4 ms
Lead Channel Sensing Intrinsic Amplitude: 16.625 mV
Lead Channel Sensing Intrinsic Amplitude: 16.625 mV
Lead Channel Sensing Intrinsic Amplitude: 4.75 mV
Lead Channel Sensing Intrinsic Amplitude: 4.75 mV
Lead Channel Setting Pacing Amplitude: 1.5 V
Lead Channel Setting Pacing Amplitude: 2 V
Lead Channel Setting Pacing Pulse Width: 0.4 ms
Lead Channel Setting Sensing Sensitivity: 2 mV

## 2021-06-23 ENCOUNTER — Other Ambulatory Visit: Payer: Self-pay | Admitting: Cardiovascular Disease

## 2021-07-01 NOTE — Addendum Note (Signed)
Addended by: Geralyn Flash D on: 07/01/2021 01:52 PM   Modules accepted: Level of Service

## 2021-07-01 NOTE — Progress Notes (Signed)
Remote pacemaker transmission.   

## 2021-07-09 ENCOUNTER — Encounter: Payer: Self-pay | Admitting: Podiatry

## 2021-07-09 ENCOUNTER — Ambulatory Visit (INDEPENDENT_AMBULATORY_CARE_PROVIDER_SITE_OTHER): Payer: Medicare Other | Admitting: Podiatry

## 2021-07-09 DIAGNOSIS — M79674 Pain in right toe(s): Secondary | ICD-10-CM | POA: Diagnosis not present

## 2021-07-09 DIAGNOSIS — B351 Tinea unguium: Secondary | ICD-10-CM | POA: Diagnosis not present

## 2021-07-09 DIAGNOSIS — M79675 Pain in left toe(s): Secondary | ICD-10-CM | POA: Diagnosis not present

## 2021-07-16 NOTE — Progress Notes (Signed)
  Subjective:  Patient ID: Travis [name], male    DOB: 02-05-1940,  MRN: [id number]  Travis [name] presents to clinic today for preventative diabetic foot care and painful elongated mycotic toenails 1-5 bilaterally which are tender when wearing enclosed shoe gear. Pain is relieved with periodic professional debridement.  Last A1c was 6.2%. Patient does not monitor blood glucose daily.  New problem(s): None.   PCP is Tisovec, Adelfa Koh, MD , and last visit was February, 2023.  No Known Allergies  Review of Systems: Negative except as noted in the HPI.  Objective: No changes noted in today's physical examination. Travis [name] is a pleasant 81 y.o. male in NAD. AAO X 3.  Vascular Examination: CFT <3 seconds b/l LE. Palpable DP pulse(s) b/l LE. Palpable PT pulse(s) b/l LE. Pedal hair absent. No pain with calf compression b/l. Lower extremity skin temperature gradient within normal limits. No edema noted b/l LE. Varicosities present b/l. No cyanosis or clubbing noted b/l LE.  Dermatological Examination: Pedal skin thin, shiny and atrophic b/l LE. No open wounds b/l LE. No interdigital macerations noted b/l LE. Toenails 1-5 b/l elongated, discolored, dystrophic, thickened, crumbly with subungual debris and tenderness to dorsal palpation. Hyperkeratotic lesion(s) submet head 5 b/l nearly resolved.  No erythema, no edema, no drainage, no fluctuance.  Neurological Examination: Protective sensation intact 5/5 intact bilaterally with 10g monofilament b/l. Vibratory sensation intact b/l. Proprioception intact bilaterally.  Musculoskeletal Examination: Normal muscle strength 5/5 to all lower extremity muscle groups bilaterally. Wearing New Balance sneakers today. HAV with bunion deformity noted b/l LE.Marland Kitchen No pain, crepitus or joint limitation noted with ROM b/l LE.  Patient ambulates independently without assistive aids.  Assessment/Plan: 1. Pain due to onychomycosis of toenails of both feet      -Examined patient. -No new findings. No new orders. -Patient to continue soft, supportive shoe gear daily. -Toenails 1-5 b/l were debrided in length and girth with sterile nail nippers and dremel without iatrogenic bleeding.  -Patient/POA to call should there be question/concern in the interim.   Return in about 3 months (around 10/09/2021).  Travis [name], DPM

## 2021-07-18 ENCOUNTER — Ambulatory Visit (INDEPENDENT_AMBULATORY_CARE_PROVIDER_SITE_OTHER): Payer: Medicare Other

## 2021-07-18 DIAGNOSIS — I495 Sick sinus syndrome: Secondary | ICD-10-CM | POA: Diagnosis not present

## 2021-07-19 LAB — CUP PACEART REMOTE DEVICE CHECK
Battery Remaining Longevity: 110 mo
Battery Voltage: 3 V
Brady Statistic AP VP Percent: 0.05 %
Brady Statistic AP VS Percent: 60.83 %
Brady Statistic AS VP Percent: 0.03 %
Brady Statistic AS VS Percent: 39.08 %
Brady Statistic RA Percent Paced: 61.04 %
Brady Statistic RV Percent Paced: 0.08 %
Date Time Interrogation Session: 20230706200529
Implantable Lead Implant Date: 20180530
Implantable Lead Implant Date: 20180530
Implantable Lead Location: 753859
Implantable Lead Location: 753860
Implantable Lead Model: 5076
Implantable Lead Model: 5076
Implantable Pulse Generator Implant Date: 20180530
Lead Channel Impedance Value: 304 Ohm
Lead Channel Impedance Value: 323 Ohm
Lead Channel Impedance Value: 399 Ohm
Lead Channel Impedance Value: 456 Ohm
Lead Channel Pacing Threshold Amplitude: 0.625 V
Lead Channel Pacing Threshold Amplitude: 0.625 V
Lead Channel Pacing Threshold Pulse Width: 0.4 ms
Lead Channel Pacing Threshold Pulse Width: 0.4 ms
Lead Channel Sensing Intrinsic Amplitude: 13.375 mV
Lead Channel Sensing Intrinsic Amplitude: 13.375 mV
Lead Channel Sensing Intrinsic Amplitude: 4.125 mV
Lead Channel Sensing Intrinsic Amplitude: 4.125 mV
Lead Channel Setting Pacing Amplitude: 1.5 V
Lead Channel Setting Pacing Amplitude: 2 V
Lead Channel Setting Pacing Pulse Width: 0.4 ms
Lead Channel Setting Sensing Sensitivity: 2 mV

## 2021-08-04 NOTE — Progress Notes (Signed)
Remote pacemaker transmission.   

## 2021-08-29 ENCOUNTER — Other Ambulatory Visit: Payer: Self-pay | Admitting: Cardiovascular Disease

## 2021-10-15 ENCOUNTER — Encounter: Payer: Self-pay | Admitting: Podiatry

## 2021-10-15 ENCOUNTER — Ambulatory Visit (INDEPENDENT_AMBULATORY_CARE_PROVIDER_SITE_OTHER): Payer: Medicare Other | Admitting: Podiatry

## 2021-10-15 DIAGNOSIS — B351 Tinea unguium: Secondary | ICD-10-CM | POA: Diagnosis not present

## 2021-10-15 DIAGNOSIS — E118 Type 2 diabetes mellitus with unspecified complications: Secondary | ICD-10-CM | POA: Diagnosis not present

## 2021-10-15 DIAGNOSIS — M79674 Pain in right toe(s): Secondary | ICD-10-CM

## 2021-10-15 DIAGNOSIS — M79675 Pain in left toe(s): Secondary | ICD-10-CM

## 2021-10-15 DIAGNOSIS — L84 Corns and callosities: Secondary | ICD-10-CM

## 2021-10-15 NOTE — Progress Notes (Signed)
  Subjective:  Patient ID: Travis [name], male    DOB: [birth year],  MRN: [id number]  Travis [name] presents to clinic today for:  Chief Complaint  Patient presents with   Diabetes    Diabetic foot care, A1c-6.1 BG- Doesn't take it, PCP- 6 months ago, Nail and callus trim    New problem(s): None.   PCP is Tisovec, Fransico Him, MD , and last visit was  May 02, 2021.  No Known Allergies  Review of Systems: Negative except as noted in the HPI.  Objective: No changes noted in today's physical examination.  Travis [name] is a pleasant 81 y.o. male WD, WN in NAD. AAO x 3.  Vascular Examination: CFT <3 seconds b/l LE. Palpable DP pulse(s) b/l LE. Palpable PT pulse(s) b/l LE. Pedal hair absent. No pain with calf compression b/l. Lower extremity skin temperature gradient within normal limits. No edema noted b/l LE. Varicosities present b/l. No cyanosis or clubbing noted b/l LE.  Dermatological Examination: Pedal skin thin, shiny and atrophic b/l LE. No open wounds b/l LE. No interdigital macerations noted b/l LE. Toenails 1-5 b/l elongated, discolored, dystrophic, thickened, crumbly with subungual debris and tenderness to dorsal palpation.   Hyperkeratotic lesion(s) spine callus submet head 1 b/l; submet head 5 b/l nearly resolved.  No erythema, no edema, no drainage, no fluctuance.  Neurological Examination: Protective sensation intact 5/5 intact bilaterally with 10g monofilament b/l. Vibratory sensation intact b/l. Proprioception intact bilaterally.  Musculoskeletal Examination: Normal muscle strength 5/5 to all lower extremity muscle groups bilaterally. Wearing New Balance sneakers today. HAV with bunion deformity noted b/l LE.Marland Kitchen No pain, crepitus or joint limitation noted with ROM b/l LE.  Patient ambulates independently without assistive aids.  Assessment/Plan: 1. Pain due to onychomycosis of toenails of both feet   2. Callus   3. Type 2 diabetes mellitus with complication, without  long-term current use of insulin ([facility])     No orders of the defined types were placed in this encounter.   -Patient was evaluated and treated. All patient's and/or POA's questions/concerns answered on today's visit. -Examined patient. -Continue foot and shoe inspections daily. Monitor blood glucose per PCP/Endocrinologist's recommendations. -Mycotic toenails 1-5 bilaterally were debrided in length and girth with sterile nail nippers and dremel without incident. -Patient/POA to call should there be question/concern in the interim.   Return in about 3 months (around 01/15/2022).  Marzetta Board, DPM

## 2021-10-17 ENCOUNTER — Ambulatory Visit (INDEPENDENT_AMBULATORY_CARE_PROVIDER_SITE_OTHER): Payer: Medicare Other

## 2021-10-17 DIAGNOSIS — I495 Sick sinus syndrome: Secondary | ICD-10-CM

## 2021-10-17 LAB — CUP PACEART REMOTE DEVICE CHECK
Battery Remaining Longevity: 106 mo
Battery Voltage: 2.99 V
Brady Statistic AP VP Percent: 1 %
Brady Statistic AP VS Percent: 84.31 %
Brady Statistic AS VP Percent: 0.02 %
Brady Statistic AS VS Percent: 14.67 %
Brady Statistic RA Percent Paced: 85.41 %
Brady Statistic RV Percent Paced: 1.02 %
Date Time Interrogation Session: 20231006015715
Implantable Lead Implant Date: 20180530
Implantable Lead Implant Date: 20180530
Implantable Lead Location: 753859
Implantable Lead Location: 753860
Implantable Lead Model: 5076
Implantable Lead Model: 5076
Implantable Pulse Generator Implant Date: 20180530
Lead Channel Impedance Value: 304 Ohm
Lead Channel Impedance Value: 323 Ohm
Lead Channel Impedance Value: 361 Ohm
Lead Channel Impedance Value: 399 Ohm
Lead Channel Pacing Threshold Amplitude: 0.625 V
Lead Channel Pacing Threshold Amplitude: 0.75 V
Lead Channel Pacing Threshold Pulse Width: 0.4 ms
Lead Channel Pacing Threshold Pulse Width: 0.4 ms
Lead Channel Sensing Intrinsic Amplitude: 15.375 mV
Lead Channel Sensing Intrinsic Amplitude: 15.375 mV
Lead Channel Sensing Intrinsic Amplitude: 3.625 mV
Lead Channel Sensing Intrinsic Amplitude: 3.625 mV
Lead Channel Setting Pacing Amplitude: 1.5 V
Lead Channel Setting Pacing Amplitude: 2 V
Lead Channel Setting Pacing Pulse Width: 0.4 ms
Lead Channel Setting Sensing Sensitivity: 2 mV

## 2021-10-23 NOTE — Progress Notes (Signed)
Remote pacemaker transmission.   

## 2021-11-03 ENCOUNTER — Ambulatory Visit: Payer: Medicare Other | Admitting: Cardiovascular Disease

## 2021-12-01 ENCOUNTER — Other Ambulatory Visit (HOSPITAL_BASED_OUTPATIENT_CLINIC_OR_DEPARTMENT_OTHER): Payer: Self-pay

## 2021-12-01 MED ORDER — COMIRNATY 30 MCG/0.3ML IM SUSY
PREFILLED_SYRINGE | INTRAMUSCULAR | 0 refills | Status: DC
  Filled 2021-12-01: qty 0.3, 1d supply, fill #0

## 2021-12-08 ENCOUNTER — Encounter: Payer: Medicare Other | Admitting: Cardiovascular Disease

## 2021-12-10 ENCOUNTER — Other Ambulatory Visit (HOSPITAL_BASED_OUTPATIENT_CLINIC_OR_DEPARTMENT_OTHER): Payer: Self-pay

## 2021-12-10 MED ORDER — AREXVY 120 MCG/0.5ML IM SUSR
INTRAMUSCULAR | 0 refills | Status: DC
  Filled 2021-12-10: qty 0.5, 1d supply, fill #0

## 2021-12-26 ENCOUNTER — Ambulatory Visit (HOSPITAL_COMMUNITY): Payer: Medicare Other | Attending: Cardiology

## 2021-12-26 DIAGNOSIS — I2510 Atherosclerotic heart disease of native coronary artery without angina pectoris: Secondary | ICD-10-CM | POA: Diagnosis present

## 2021-12-26 DIAGNOSIS — I358 Other nonrheumatic aortic valve disorders: Secondary | ICD-10-CM | POA: Insufficient documentation

## 2021-12-26 LAB — ECHOCARDIOGRAM COMPLETE
Area-P 1/2: 3.91 cm2
P 1/2 time: 653 msec
S' Lateral: 2.2 cm

## 2022-01-15 ENCOUNTER — Ambulatory Visit: Payer: Medicare Other | Attending: Cardiovascular Disease | Admitting: Cardiovascular Disease

## 2022-01-15 VITALS — BP 112/60 | Ht 71.0 in | Wt 156.2 lb

## 2022-01-15 DIAGNOSIS — I10 Essential (primary) hypertension: Secondary | ICD-10-CM | POA: Insufficient documentation

## 2022-01-15 DIAGNOSIS — Z950 Presence of cardiac pacemaker: Secondary | ICD-10-CM | POA: Diagnosis not present

## 2022-01-15 DIAGNOSIS — I495 Sick sinus syndrome: Secondary | ICD-10-CM | POA: Insufficient documentation

## 2022-01-15 DIAGNOSIS — I4729 Other ventricular tachycardia: Secondary | ICD-10-CM | POA: Diagnosis present

## 2022-01-15 DIAGNOSIS — I2510 Atherosclerotic heart disease of native coronary artery without angina pectoris: Secondary | ICD-10-CM | POA: Insufficient documentation

## 2022-01-15 DIAGNOSIS — E7800 Pure hypercholesterolemia, unspecified: Secondary | ICD-10-CM | POA: Insufficient documentation

## 2022-01-15 DIAGNOSIS — I350 Nonrheumatic aortic (valve) stenosis: Secondary | ICD-10-CM | POA: Diagnosis not present

## 2022-01-15 NOTE — Patient Instructions (Signed)
Medication Instructions:  NO CHANGES  *If you need a refill on your cardiac medications before your next appointment, please call your pharmacy*   Follow-Up: At [facility], you and your health needs are our priority.  As part of our continuing mission to provide you with exceptional heart care, we have created designated Provider Care Teams.  These Care Teams include your primary Cardiologist (physician) and Advanced Practice Providers (APPs -  Physician Assistants and Nurse Practitioners) who all work together to provide you with the care you need, when you need it.  We recommend signing up for the patient portal called "MyChart".  Sign up information is provided on this After Visit Summary.  MyChart is used to connect with patients for Virtual Visits (Telemedicine).  Patients are able to view lab/test results, encounter notes, upcoming appointments, etc.  Non-urgent messages can be sent to your provider as well.   To learn more about what you can do with MyChart, go to [email or web address].    Your next appointment:    12 months with Dr. Croitoru    

## 2022-01-15 NOTE — Progress Notes (Signed)
Cardiology Office Note:    Date:  01/17/2022   ID:  Travis [name], DOB 19-Feb-1940, MRN [id number]  PCP:  Haywood Pao, MD  Cardiologist: Shelva Majestic M.D.; Sanda Klein, MD    Referring MD: Osborne Casco Fransico Him, MD  Chief Complaint  Patient presents with   Pacemaker Check    History of Present Illness:    Travis [name] is a 82 y.o. male with a hx of hypertension and hyperlipidemia, moderate CAD, mild aortic valve stenosis, severe sinus bradycardia, periods of junctional escape rhythm and sinus pauses of up to 6 seconds in duration on an event monitor, for which she received a dual-chamber pacemaker in 2018 (Medtronic MRI conditional).  He underwent elective implantation of a dual-chamber Medtronic Azure XT permanent pacemaker on 06/10/2016. He had coronary angiography the same day which showed 65% mid LAD, 70% second diagonal, 30-40%stenoses in the mid circumflex.  He has mild aortic valve stenosis by an echo performed in May 2020.  Unfortunately, fFrank's wife of 60 years passed away about 3 months ago.  He continues to live in his home and has good support from his daughters and friends.  He continues to run regularly.  The patient specifically denies any chest pain at rest or with exertion, dyspnea at rest or with exertion, orthopnea, paroxysmal nocturnal dyspnea, syncope, palpitations, focal neurological deficits, intermittent claudication, lower extremity edema, unexplained weight gain, cough, hemoptysis or wheezing.   Pacemaker check today shows normal device function.  Estimated generator longevity is 8.7 years and lead parameters are normal.  Presenting rhythm is atrial paced, ventricular sensed with long AV delay 260 ms.  He has 50% atrial pacing and only 0.2% ventricular pacing.  He has rare episodes of nonsustained ventricular tachycardia lasting 5-9 beats (only 3 episodes in the last year) he has not had any atrial fibrillation.  Underlying rhythm is marked sinus bradycardia  at about 40 bpm.  Heart rate histogram distribution is appropriate.   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   SSS (sick sinus syndrome) ([place]) 06/10/2016   Type II diabetes mellitus ([place])     Past Surgical History:  Procedure Laterality Date   APPENDECTOMY     CARDIAC CATHETERIZATION  06/10/2016   CATARACT EXTRACTION W/ INTRAOCULAR LENS IMPLANT Right    CYSTOSCOPY W/ STONE MANIPULATION     INSERT / REPLACE / REMOVE PACEMAKER  06/10/2016   dual chamber permanent pacemaker   KNEE ARTHROSCOPY W/ MENISCECTOMY Bilateral    LAPAROSCOPIC CHOLECYSTECTOMY     LEFT HEART CATH AND CORONARY ANGIOGRAPHY N/A 06/10/2016   Procedure: Left Heart Cath and Coronary Angiography;  Surgeon: Jolaine Artist, MD;  Location: [facility];  Service: Cardiovascular;  Laterality: N/A;   LITHOTRIPSY     PACEMAKER IMPLANT N/A 06/10/2016   Procedure: Pacemaker Implant;  Surgeon: Sanda Klein, MD;  Location: [facility];  Service: Cardiovascular;  Laterality: N/A;   TONSILLECTOMY      Current Medications: Current Meds  Medication Sig   amLODipine (NORVASC) 5 MG tablet TAKE 1&1/2 TABLETS ONCE DAILY.   aspirin EC 81 MG tablet Take 81 mg by mouth daily.   atorvastatin (LIPITOR) 40 MG tablet TAKE ONE  TABLET BY MOUTH DAILY.   Blood Glucose Monitoring Suppl ([place]) w/Device KIT Use to test blood sugars 2-3x a week   Cholecalciferol (VITAMIN D3) 2000 units capsule Take 2,000 Units by mouth daily.   COMBIGAN 0.2-0.5 % ophthalmic solution Place 1 drop into both eyes 2 (two) times daily.    ezetimibe (ZETIA) 10 MG tablet TAKE ONE TABLET BY MOUTH DAILY.   glucose blood (ONETOUCH VERIO) test strip Use to test blood sugars 2-3x a week  E11.9   hydrochlorothiazide (MICROZIDE) 12.5 MG capsule TAKE ONE CAPSULE EVERY DAY.   lisinopril (ZESTRIL) 20 MG tablet Take 1 tablet by mouth daily.   metFORMIN (GLUCOPHAGE) 500 MG tablet Take 500 mg by mouth 2 (two) times daily with a meal.    mupirocin ointment (BACTROBAN) 2 % SMARTSIG:sparingly Topical Twice Daily     Allergies:   Patient has no known allergies.   Social History   Socioeconomic History   Marital status: Married    Spouse name: Not on file   Number of children: Not on file   Years of education: Not on file   Highest education level: Not on file  Occupational History   Not on file  Tobacco Use   Smoking status: Former    Packs/day: 0.50    Years: 25.00    Total pack years: 12.50    Types: Cigarettes    Quit date: 87    Years since quitting: 41.0   Smokeless tobacco: Never  Vaping Use   Vaping Use: Never used  Substance and Sexual Activity   Alcohol use: Yes    Comment: 06/10/2016 "might have 2 beers/month"   Drug use: No   Sexual activity: Not on file  Other Topics Concern   Not on file  Social History Narrative   Not on file   Social Determinants of Health   Financial Resource Strain: Not on file  Food Insecurity: Not on file  Transportation Needs: Not on file  Physical Activity: Not on file  Stress: Not on file  Social Connections: Not on file     Family History: The patient's family history includes Colon cancer in his father. ROS:   Please see the history of present illness.    All other systems are reviewed and are negative.   EKGs/Labs/Other Studies Reviewed:    The following studies were reviewed today: Full pacemaker check performed today 01/15/2022  12/26/2021 echocardiogram   1. Left ventricular ejection fraction, by estimation, is 60 to 65%. The  left ventricle has normal function. The left ventricle has no regional  wall motion abnormalities. Left ventricular diastolic parameters are  indeterminate.   2. Right  ventricular systolic function is normal. The right ventricular  size is normal. There is normal pulmonary artery systolic pressure. The  estimated right ventricular systolic pressure is 77.8 mmHg.   3. The mitral valve is normal in structure. Mild mitral valve  regurgitation. No evidence of mitral stenosis.   4. Tricuspid valve regurgitation is mild to moderate.   5. The aortic valve is normal in structure. Aortic valve regurgitation is  mild. Aortic valve sclerosis/calcification is present, without any  evidence of aortic stenosis.   6. The inferior vena cava is normal in size with greater than 50%  respiratory variability, suggesting right atrial pressure of 3 mmHg.   EKG:  EKG is not ordered today.  Intracardiac electrogram shows atrial paced, ventricular sensed rhythm  Recent Labs: No results found for requested labs within last  365 days.  10/09/2019 Hemoglobin 14.1, creatinine 1.3, TSH 3.1, hemoglobin A1c 6.6% Total cholesterol 110, HDL 52, LDL 50, triglycerides 82, APO B 59  10/23/2020 Cholesterol 103, HDL 46, LDL 46, triglycerides 53 potassium 4.5, normal liver function tests, hemoglobin A1c 6.2%  11/21/2021 Cholesterol 114, HDL 54, LDL 47, triglycerides 63 Hemoglobin A1c 6.3% Potassium 4.8, ALT 23  Physical Exam:    VS:  BP 112/60 (BP Location: Left Arm, Patient Position: Sitting, Cuff Size: Normal)   Ht _0  (1.803 m)   Wt 156 lb 3.2 oz (70.9 kg)   SpO2 99%   BMI 21.79 kg/m     Wt Readings from Last 3 Encounters:  01/15/22 156 lb 3.2 oz (70.9 kg)  12/26/20 165 lb 12.8 oz (75.2 kg)  12/06/20 158 lb (71.7 kg)     General: Alert, oriented x3, no distress, lean and fit, looks younger than stated age.  Healthy left occlusion pacemaker site. Head: no evidence of trauma, PERRL, EOMI, no exophtalmos or lid lag, no myxedema, no xanthelasma; normal ears, nose and oropharynx Neck: normal jugular venous pulsations and no hepatojugular reflux; brisk carotid pulses without  delay and no carotid bruits Chest: clear to auscultation, no signs of consolidation by percussion or palpation, normal fremitus, symmetrical and full respiratory excursions Cardiovascular: normal position and quality of the apical impulse, regular rhythm, normal first and second heart sounds, 1-2/6 aortic ejection murmur, no diastolic murmurs, rubs or gallops Abdomen: no tenderness or distention, no masses by palpation, no abnormal pulsatility or arterial bruits, normal bowel sounds, no hepatosplenomegaly Extremities: no clubbing, cyanosis or edema; 2+ radial, ulnar and brachial pulses bilaterally; 2+ right femoral, posterior tibial and dorsalis pedis pulses; 2+ left femoral, posterior tibial and dorsalis pedis pulses; no subclavian or femoral bruits Neurological: grossly nonfocal Psych: Normal mood and affect     ASSESSMENT:    1. SSS (sick sinus syndrome) ([place])   2. Pacemaker   3. Coronary artery disease involving native coronary artery of native heart without angina pectoris   4. Nonrheumatic aortic valve stenosis   5. Essential hypertension   6. NSVT (nonsustained ventricular tachycardia) ([facility])   7. Hypercholesterolemia        PLAN:    In order of problems listed above:  1. SSS: Does not have any symptoms of bradycardia.  Heart rate histogram distribution suggest the current sensor settings are appropriate. 2. Pacemaker: Remote is every 3 months. 3. CAD: Asymptomatic in spite of his very active lifestyle.  Moderate disease identified at angiography a few years ago. 4. AS: Was described as only having aortic sclerosis by most recent echocardiogram last month, but mild aortic stenosis by echo in early 2021 5. HTN: Very well-controlled 6. NSVT: Rare events are recorded by his pacemaker a few times a year and have been consistently asymptomatic and never associated with physical activity. 7. HLP: All lipid parameters in target range.  Continue statin.    Medication  Adjustments/Labs and Tests Ordered: Current medicines are reviewed at length with the patient today.  Concerns regarding medicines are outlined above. Labs and tests ordered and medication changes are outlined in the patient instructions below:  Patient Instructions  Medication Instructions:  NO CHANGES  *If you need a refill on your cardiac medications before your next appointment, please call your pharmacy*   Follow-Up: At [facility], you and your health needs are our priority.  As part of our continuing mission to provide you with exceptional heart care, we have created designated  Provider Care Teams.  These Care Teams include your primary Cardiologist (physician) and Advanced Practice Providers (APPs -  Physician Assistants and Nurse Practitioners) who all work together to provide you with the care you need, when you need it.  We recommend signing up for the patient portal called "MyChart".  Sign up information is provided on this After Visit Summary.  MyChart is used to connect with patients for Virtual Visits (Telemedicine).  Patients are able to view lab/test results, encounter notes, upcoming appointments, etc.  Non-urgent messages can be sent to your provider as well.   To learn more about what you can do with MyChart, go to [email or web address].    Your next appointment:    12 months with Dr. Sallyanne Kuster        Signed, Sanda Klein, MD  01/17/2022 11:18 AM    [place]

## 2022-01-16 ENCOUNTER — Ambulatory Visit (INDEPENDENT_AMBULATORY_CARE_PROVIDER_SITE_OTHER): Payer: Medicare Other

## 2022-01-16 DIAGNOSIS — I495 Sick sinus syndrome: Secondary | ICD-10-CM

## 2022-01-16 LAB — CUP PACEART REMOTE DEVICE CHECK
Battery Remaining Longevity: 105 mo
Battery Voltage: 2.98 V
Brady Statistic AP VP Percent: 0.24 %
Brady Statistic AP VS Percent: 76.24 %
Brady Statistic AS VP Percent: 0.01 %
Brady Statistic AS VS Percent: 23.52 %
Brady Statistic RA Percent Paced: 76.52 %
Brady Statistic RV Percent Paced: 0.25 %
Date Time Interrogation Session: 20240105004420
Implantable Lead Connection Status: 753985
Implantable Lead Connection Status: 753985
Implantable Lead Implant Date: 20180530
Implantable Lead Implant Date: 20180530
Implantable Lead Location: 753859
Implantable Lead Location: 753860
Implantable Lead Model: 5076
Implantable Lead Model: 5076
Implantable Pulse Generator Implant Date: 20180530
Lead Channel Impedance Value: 304 Ohm
Lead Channel Impedance Value: 323 Ohm
Lead Channel Impedance Value: 380 Ohm
Lead Channel Impedance Value: 418 Ohm
Lead Channel Pacing Threshold Amplitude: 0.625 V
Lead Channel Pacing Threshold Amplitude: 0.75 V
Lead Channel Pacing Threshold Pulse Width: 0.4 ms
Lead Channel Pacing Threshold Pulse Width: 0.4 ms
Lead Channel Sensing Intrinsic Amplitude: 14.125 mV
Lead Channel Sensing Intrinsic Amplitude: 14.125 mV
Lead Channel Sensing Intrinsic Amplitude: 4.375 mV
Lead Channel Sensing Intrinsic Amplitude: 4.375 mV
Lead Channel Setting Pacing Amplitude: 1.5 V
Lead Channel Setting Pacing Amplitude: 2 V
Lead Channel Setting Pacing Pulse Width: 0.4 ms
Lead Channel Setting Sensing Sensitivity: 2 mV
Zone Setting Status: 755011

## 2022-01-16 NOTE — Progress Notes (Unsigned)
Cardiology Office Note    Date:  01/19/2022   ID:  Zenith, Lamphier [birth year], MRN [id number]  PCP:  Gaspar Garbe, MD  Referring M.D.: Dr. Lorenda Ishihara  Cardiologist:  Nicki Guadalajara, MD    History of Present Illness:  Travis [name] is a 82 y.o. male who was initially referred through the courtesy of Dr.Varadarajan for evaluation of lightheadedness and dizziness while running. He presents for a 58-month follow-up evaluation.  Travis [name] has been active for his entire life.  He has a history of hypertension, hyperlipidemia, and has been diagnosed with type 2 diabetes mellitus for 14 years.  He exercises at least 6 days per week for 1-2 hours.  He does resistance training, spin classes, and typically runs anywhere from 5-6 miles at a 9-10 minute mile pace and has been doing so for many years.  His pulse typically runs in the low 30s.  Recently, he has begun to notice that he becomes dizzy when he starts to run.  This is associated with mild shortness of breath.  He denies any chest pain.  The symptoms have occurred since January.  He denies any frank syncope.  He denies the lightheaded symptoms when he is at rest and his symptoms only occur with significant activity.  When he does spin class he is unaware of any  symptoms.    When I saw for initial evaluation, his ECG revealed marked sinus bradycardia with ventricular rate averaging 37 bpm.  PR interval was 190 ms and QTc interval was 335 ms.  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  He had a 2/6 systolic murmur on physical exam, suggestive of possible aortic valve disease.  I scheduled him for a 2-D echo Doppler study which was done on 06/02/2016.  This showed an ejection fraction at 55-65%.  There was mild LVH.  He did not have any regional wall motion abnormalities.  His aortic valve was reported as structurally normal.  There was a mean gradient of 9 and a  peak gradient of 17.  There was mild MR and mild LA dilation.  His right atrium was mild moderately dilated.  I obtain a magnesium level, which was normal.  I scheduled him for an exercise nuclear stress test to assess both chronotropic competence to exercise as well as myocardial perfusion.  On his exercise portion.  His resting heart rate was 34 bpm, which rose to a maximum of 126/m.  He completed 2 minutes and 2 seconds into stage III of Bruce protocol.  He was able to augment his heart rate appropriately with exercise.  At the end of stage I reflective of good aerobic conditioning ventricular rate was 71, at the end of stage II 100, and in stage III maximum heart rate was 125.  He was entirely asymptomatic but had an exaggerated blood pressure response opted to 16/75 and at peak stress developed asymptomatic ST segment depression up to 3 mm in the inferior and inferolateral leads.  There was a very small mild distal anteroseptal defect which most likely was artifact.  There was no ischemia identified.  He has been wearing an event monitor.  We werereceivingdaily notifications demonstrating marked bradycardia with heart rates in the 30s.  These have been sinus rhythm.  He has been entirely asymptomatic.  He had developed sinus pauses up to 5 - 6 seconds.  He remained entirely asymptomatic and specifically  denied any dizziness or lightheadedness or chest pain.  He was unaware of sleep apnea but he does snore.    At  follow-up on 06/04/2016  I had extensive discussion with him, both concerning his ST segment changes on ECG during stress testing as well as his need for a permanent pacemaker.  Due to scheduling difficulties and since he remained asymptomatic, ultimately, both of these procedures were done on 06/10/2016.  Diagnostic catheterization revealed normal LV function.  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  There was mild 30 and 40% smooth  circumflex narrowings.  Later that same day in the afternoon he underwent successful insertion of a permanent pacemaker by Dr. 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  He tolerated both procedures well.  Following his pacemaker insertion, he has continued to run and has been without dizziness or lightheadedness. He saw Dr. Royann Shivers on 09/15/2016 for pacemaker follow-up evaluation.  He had normal device function.  He is now scheduled to undergo remote download every 3 months and have yearly office visits with Dr. Royann Shivers. His blood pressure at that time was elevated his blood pressure at that time was elevated He is now scheduled to undergo remote downloads every 3 months  He had normal device function.  And no longer experiences any dizziness or lightheadedness.  He saw Dr. Royann Shivers on September 15, 2016 for pacemaker follow-up evaluation.   His blood pressure that day was elevated and his lisinopril was increased to 20 mg and he continued to be on amlodipine.  He feels his blood pressure has been better.  He has continued to run races and today participated in the [place] state games where he finished in second place and qualified for the nationals.  In early 2018 had been running approximately 30 miles per week and denied chest pain, PND orthopnea.   I added Zetia to his regimen of atorvastatin.  This is resulted in marked improvement in lipid status and blood work done by his primary physician on March 03, 2017 showed an LDL cholesterol at 31.  He is on lisinopril 20 mg and amlodipine 5 mg for hypertension.  He is on metformin for mild type 2 diabetes mellitus.  Recent hemoglobin A1c was 6.3.  He has had some mild knee discomfort and for this reason has elected not to participate in the nationals and running in June.   When I saw him in December 2019 he continued to remain asymptomatic.  He still runs but he had  fractured his left kneecap which had limit his is activity for some time.  He was unaware of any palpitations.  He denies any chest pressure PND orthopnea.  He underwent repeat laboratory on December 17, 2017 which showed excellent lipid studies with total cholesterol 119, HDL 67, LDL 43, and triglycerides 45.    I evaluated him in a telemedicine visit on June 28, 2018 and he continued to feel well. He now sees Dr. Wylene Simmer for his primary care.  He continues to be active and is running 4 to 5 miles per day at least 6 days/week.  He denies any chest tightness or pressure.  He denies any lightheadedness.  He denies PND orthopnea.  A pacemaker remote check on June 2 which revealed stable pacemaker function with stable lead measurements and heart rate histogram.  There apparently was 1 event consistent  with a 12 beats of SVT.  The patient remains asymptomatic.   He saw Dr. Royann Shiversroitoru in September 2020.  Presently he remains asymptomatic.  He is running 6 days/week for at least 5 miles a day at a 10 to 11 mile pace.  Blood pressure at home has been slightly elevated in the 140s systolically.  He denies palpitations.  He denies shortness of breath.  He denies leg swelling.    I saw him on December 26, 2018.  His blood pressure was elevated and at home had been running in the 140 systolically and on repeat by me in the office had increased to 168/88.  I recommended titration of amlodipine 7.5 mg daily if blood pressure continues to be increased after 2 weeks of treatment further titration to 10 mg was recommended.  When I saw him in April 2021 he felt well.  His blood pressure had significantly improved.   He continues to run most days at least 5 miles at a time typically at 11 to 12-minute pace.  He underwent an echo Doppler study in January 2021 which showed an EF of 55 to 60% with grade 1 diastolic dysfunction.  There was mild to moderate aortic valve sclerosis without stenosis.  There was moderate dilation of his  left atrium with mild dilation of his right atrium.  When I last saw him he did have a suggestion of a possible squeaky friction rub which was positional.  His pericardium was normal with no effusion.  He has noted some swelling in his ankles.  I last saw him on December 04, 2019 and since his prior evaluation he continued to be active.     Three weeks ago he ran a half marathon at age 82 year old and finished in first place at a time of 2 hours and 57 minutes.  He continues to have follow-up pacemaker checks remotely had seen Dr. Royann Shiversroitoru on October 23, 2019.  Pacemaker interrogation demonstrated normal device function with anticipated generator longevity of about 10.8 years.  He denies chest pain or shortness of breath.  He had had laboratory done by Dr. Wylene Simmerisovec.  Labs were stable although initial PSA was mildly increased at 4.8 which he states on follow-up had improved.  He continues to be on triple drug therapy with blood pressure control.  Amlodipine 5 mg, hydrochlorothiazide 12.5 mg, lisinopril 20 mg.  He is on atorvastatin 40 mg and Zetia 10 mg for hyperlipidemia.  He continues to be on Metformin 500 mg twice a day for diabetes mellitus.    Travis [name] was evaluated by Dr. Royann Shiversroitoru on October 28, 2020 and he had normal pacemaker device function.  I last saw him on 01/05/2021. He continued to feel well and was consistently but approximately 1 month ago he had a fall and as result has not been running as he had in the past.  He denied any chest pain or exertional dyspnea.  He is still planning to run in several 5 and 10K races.  He denies any lightheadedness.  He is unaware of palpitations.    He underwent a follow-up echo Doppler study on December 26, 2021.  This continues to show normal systolic function with normal RV pressure.  There was mild MR, mild to moderate TR, and aortic valve sclerosis.  He saw Dr. Royann Shiversroitoru for pacemaker follow-up in January 15, 2022.Marland Kitchen.  He remained stable.  Heart rate  histogram distribution suggest current sensor settings are appropriate.  He did not have any clinically significant  episodes of high ventricular rate or atrial mode switch..  Since I last saw Travis [name], unfortunately his wife died 3 months ago after she was cardioverted in the emergency room for A-fib and apparently died 2 days later at home.  His 2 daughters have been checking on him regularly.  He continues to be active and runs at least 4 days/week and goes to the gym the other 3 days.  He still participates in occasional 5K runs but has not traveled for these races.  He underwent on December 26, 2021.  This showed normal LV function with EF 60 to 65%, normal RV pressures mild MR, mild to moderate TR, and mild aortic sclerosis with mild AR.  He denies chest pain shortness of breath or palpitations.  He sees Dr. Wylene Simmerisovec.  Lipid studies in November 2023 were excellent with LDL cholesterol 47.  Hemoglobin A1c was 6.3.  Renal function was normal.  He presents for follow-up evaluation.  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   SSS (sick sinus syndrome) (HCC) 06/10/2016   Type II diabetes mellitus (HCC)     Past Surgical History:  Procedure Laterality Date   APPENDECTOMY     CARDIAC CATHETERIZATION  06/10/2016   CATARACT EXTRACTION W/ INTRAOCULAR LENS IMPLANT Right    CYSTOSCOPY W/ STONE MANIPULATION     INSERT / REPLACE / REMOVE PACEMAKER  06/10/2016   dual chamber permanent pacemaker   KNEE ARTHROSCOPY W/ MENISCECTOMY Bilateral    LAPAROSCOPIC CHOLECYSTECTOMY     LEFT HEART CATH AND CORONARY ANGIOGRAPHY N/A 06/10/2016   Procedure: Left Heart Cath and Coronary Angiography;  Surgeon: Dolores PattyBensimhon, Daniel R, MD;  Location: [facility];  Service:  Cardiovascular;  Laterality: N/A;   LITHOTRIPSY     PACEMAKER IMPLANT N/A 06/10/2016   Procedure: Pacemaker Implant;  Surgeon: Thurmon Fairroitoru, Mihai, MD;  Location: [facility];  Service: Cardiovascular;  Laterality: N/A;   TONSILLECTOMY      Current Medications: Outpatient Medications Prior to Visit  Medication Sig Dispense Refill   amLODipine (NORVASC) 5 MG tablet TAKE 1&1/2 TABLETS ONCE DAILY. 135 tablet 1   aspirin EC 81 MG tablet Take 81 mg by mouth daily.     atorvastatin (LIPITOR) 40 MG tablet TAKE ONE TABLET BY MOUTH DAILY. 90 tablet 3   Blood Glucose Monitoring Suppl (ONETOUCH VERIO FLEX SYSTEM) w/Device KIT Use to test blood sugars 2-3x a week     Cholecalciferol (VITAMIN D3) 2000 units capsule Take 2,000 Units by mouth daily.     COMBIGAN 0.2-0.5 % ophthalmic solution Place 1 drop into both eyes 2 (two) times daily.      ezetimibe (ZETIA) 10 MG tablet TAKE ONE TABLET BY MOUTH DAILY. 90 tablet 3   glucose blood (ONETOUCH VERIO) test strip Use to test blood sugars 2-3x a week E11.9     hydrochlorothiazide (MICROZIDE) 12.5 MG capsule TAKE ONE CAPSULE EVERY DAY. 90 capsule 1   lisinopril (ZESTRIL) 20 MG tablet Take 1 tablet by mouth daily.     metFORMIN (GLUCOPHAGE) 500 MG tablet Take 500 mg by mouth 2 (two) times daily with a meal.      amLODipine (NORVASC) 10 MG tablet SMARTSIG:1 By Mouth (  Patient not taking: Reported on 01/15/2022)     mupirocin ointment (BACTROBAN) 2 % SMARTSIG:sparingly Topical Twice Daily (Patient not taking: Reported on 01/19/2022)     No facility-administered medications prior to visit.     Allergies:   Patient has no known allergies.   Social History   Socioeconomic History   Marital status: Married    Spouse name: Not on file   Number of children: Not on file   Years of education: Not on file   Highest education level: Not on file  Occupational History   Not on file  Tobacco Use   Smoking status: Former    Packs/day: 0.50    Years: 25.00     Total pack years: 12.50    Types: Cigarettes    Quit date: 60    Years since quitting: 41.0   Smokeless tobacco: Never  Vaping Use   Vaping Use: Never used  Substance and Sexual Activity   Alcohol use: Yes    Comment: 06/10/2016 "might have 2 beers/month"   Drug use: No   Sexual activity: Not on file  Other Topics Concern   Not on file  Social History Narrative   Not on file   Social Determinants of Health   Financial Resource Strain: Not on file  Food Insecurity: Not on file  Transportation Needs: Not on file  Physical Activity: Not on file  Stress: Not on file  Social Connections: Not on file    Social history is notable in that he was born in [place].  He's been married for over 55 years.  He has 2 children, 7 grandchildren, and one great-grandchild.  He has a Event organiser in Engineer, technical sales.  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  He drinks occasional beer.  He does not smoke.   Family History:  The patient's family history includes Colon cancer in his father..  He has a brother age 86 and a sister age 52, who are healthy.   ROS General: Negative; No fevers, chills, or night sweats;  HEENT: Negative; No changes in vision or hearing, sinus congestion, difficulty swallowing Pulmonary: Negative; No cough, wheezing, shortness of breath, hemoptysis Cardiovascular:  See HPI GI: Negative; No nausea, vomiting, diarrhea, or abdominal pain GU: Negative; No dysuria, hematuria, or difficulty voiding Musculoskeletal: Left knee discomfort Hematologic/Oncology: Negative; no easy bruising, bleeding Endocrine: Negative; no heat/cold intolerance; no diabetes Neuro: Negative; no changes in balance, headaches Skin: Negative; No rashes or skin lesions Psychiatric: Negative; No behavioral problems, depression Sleep: Positive for snoring, no daytime sleepiness, hypersomnolence, bruxism, restless legs, hypnogognic hallucinations, no  cataplexy Other comprehensive 14 point system review is negative.   PHYSICAL EXAM:   VS:  BP 112/62 (BP Location: Left Arm, Patient Position: Sitting, Cuff Size: Normal)   Pulse 85   Ht 5\' 11"  (1.803 m)   Wt 155 lb 3.2 oz (70.4 kg)   SpO2 98%   BMI 21.65 kg/m     Repeat blood pressure by me was 115/60  Wt Readings from Last 3 Encounters:  01/19/22 155 lb 3.2 oz (70.4 kg)  01/15/22 156 lb 3.2 oz (70.9 kg)  12/26/20 165 lb 12.8 oz (75.2 kg)     BP 112/62 (BP Location: Left Arm, Patient Position: Sitting, Cuff Size: Normal)   Pulse 85   Ht 5\' 11"  (1.803 m)   Wt 155 lb 3.2 oz (70.4 kg)   SpO2 98%   BMI 21.65 kg/m  General: Alert, oriented, no distress.  Skin: normal turgor, no rashes, warm and dry HEENT: Normocephalic, atraumatic. Pupils equal round and reactive to light; sclera anicteric; extraocular muscles intact;  Nose without nasal septal hypertrophy Mouth/Parynx benign; Mallinpatti scale 2 Neck: No JVD, no carotid bruits; normal carotid upstroke Lungs: clear to ausculatation and percussion; no wheezing or rales Chest wall: without tenderness to palpitation Heart: PMI not displaced, RRR, s1 s2 normal, 1/6 systolic murmur, no diastolic murmur, no rubs, gallops, thrills, or heaves Abdomen: soft, nontender; no hepatosplenomehaly, BS+; abdominal aorta nontender and not dilated by palpation. Back: no CVA tenderness Pulses 2+ Musculoskeletal: full range of motion, normal strength, no joint deformities Extremities: no clubbing cyanosis or edema, Homan's sign negative  Neurologic: grossly nonfocal; Cranial nerves grossly wnl Psychologic: Normal mood and affect   Studies/Labs Reviewed:   I personally reviewed the ECG of January 15, 2022 ECG (independently read by me):    December 26, 2020 ECG (independently read by me): Atrial paced, PR 276 msec, nonspecific T abnormality  December 04, 2019 ECG (independently read by me): Atrially paced rhythm at 60 bpm with prolonged AV  conduction with a PR interval of 234 ms.  No ectopy.  April 2021 ECG (independently read by me): Atrially paced at 55 bpm.  PR interval 218 ms.  No ectopy.  December 2020 ECG (independently read by me): Atrially paced rhythm at 69 bpm with prolonged AV conduction, PR interval 222 ms.  LVH with repolarization changes.  December 2019 ECG (independently read by me): Atrially paced rhythm with PVCs and transient ventricular bigeminy.  Prolonged AV conduction with PR interval 230 ms.  May 2019 ECG (independently read by me): Atrial paced rhythm at 51 bpm.  Prolonged AV conduction with a PR interval of 242 ms.  QTc interval 383 ms.  November 24, 2016 EKG:  EKG is ordered today.  A paced rhythm at 51 bpm.  PR interval 222 ms.  Mild LVH.  QTc interval 372 ms  06/16/2016 ECG (independently read by me): Atrially paced rhythm at 50 bpm with prolonged AV conduction with a PR interval at 214 ms.  06/04/2016 ECG (independently read by me): Marked sinus bradycardia with possible low atrial involvement.  PR interval 166 ms, QTc interval 335 ms.  Ventricular rate 36.  05/20/2016 ECG (independently read by me): Marked sinus bradycardia with sinus arrhythmia, ventricular rate in the 40s to upper 30s, average 37.  PR interval 190 ms.  QTc interval 335 ms.  No ST segment changes.  Recent Labs:    Latest Ref Rng & Units 12/17/2017    8:42 AM 03/03/2017    8:13 AM 11/27/2016    2:09 PM  BMP  Glucose 65 - 99 mg/dL 774  128  786   BUN 8 - 27 mg/dL 18  22  23    Creatinine 0.76 - 1.27 mg/dL  7.67  2.09   BUN/Creat Ratio 10 - 24 18  19  21    Sodium 134 - 144 mmol/L 141  140  138   Potassium 3.5 - 5.2 mmol/L 4.5  4.7  4.2   Chloride 96 - 106 mmol/L 102  106  98   CO2 20 - 29 mmol/L 23  23  26    Calcium 8.6 - 10.2 mg/dL 4.70           Latest Ref Rng & Units 12/17/2017    8:42 AM 03/03/2017    8:13 AM 11/17/2016   10:51 AM  Hepatic  Function  Total Protein 6.0 - 8.5 g/dL 6.5  6.4  7.1   Albumin  3.5 - 4.8 g/dL 4.6  4.5  4.8   AST 0 - 40 IU/L 17  19  20    ALT 0 - 44 IU/L 21  21  19    Alk Phosphatase 39 - 117 IU/L 77  62  82   Total Bilirubin 0.0 - 1.2 mg/dL 1.1  1.2  1.3        Latest Ref Rng & Units 06/04/2016   12:00 AM  CBC  WBC 3.4 - 10.8 x10E3/uL 7.1   Hemoglobin 13.0 - 17.7 g/dL 14.2   Hematocrit 37.5 - 51.0 % 41.9   Platelets 150 - 379 x10E3/uL 260    Lab Results  Component Value Date   MCV 96 06/04/2016   No results found for: "TSH" Lab Results  Component Value Date   HGBA1C 7.7 (H) 12/17/2017     BNP No results found for: "BNP"  ProBNP No results found for: "PROBNP"   Lipid Panel     Component Value Date/Time   CHOL 119 12/17/2017 0839   TRIG 45 12/17/2017 0839   HDL 67 12/17/2017 0839   CHOLHDL 1.8 12/17/2017 0839   LDLCALC 43 12/17/2017 0839     RADIOLOGY: CUP PACEART REMOTE DEVICE CHECK  Result Date: 01/16/2022 Scheduled remote reviewed. Normal device function.  Next remote 91 days. LA  ECHOCARDIOGRAM COMPLETE  Result Date: 12/26/2021    ECHOCARDIOGRAM REPORT   Patient Name:   Travis [name] Date of Exam: 12/26/2021 Medical Rec #:  [id number]     Height:       71.0 in Accession #:    [id number]    Weight:       165.8 lb Date of Birth:  1940/08/25     BSA:          1.947 m Patient Age:    42 years      BP:           120/76 mmHg Patient Gender: M             HR:           83 bpm. 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 CAD, Pacemaker, Signs/Symptoms:Shortness of                 Breath and Murmur; Risk Factors:Former Smoker, Hypertension,                 Diabetes and Dyslipidemia. Aortic valve sclerosis. Dizziness.                 Sick sinus syndrome.  Sonographer:    Diamond Nickel RCS Referring  Phys: Pena  1. Left ventricular ejection fraction, by estimation, is 60 to 65%. The left ventricle has normal function. The left ventricle has no regional wall motion abnormalities. Left ventricular diastolic parameters are indeterminate.  2. Right ventricular systolic function is normal. The right ventricular size is normal. There is normal pulmonary artery systolic pressure. The estimated right ventricular systolic pressure  is 28.6 mmHg.  3. The mitral valve is normal in structure. Mild mitral valve regurgitation. No evidence of mitral stenosis.  4. Tricuspid valve regurgitation is mild to moderate.  5. The aortic valve is normal in structure. Aortic valve regurgitation is mild. Aortic valve sclerosis/calcification is present, without any evidence of aortic stenosis.  6. The inferior vena cava is normal in size with greater than 50% respiratory variability, suggesting right atrial pressure of 3 mmHg. FINDINGS  Left Ventricle: Left ventricular ejection fraction, by estimation, is 60 to 65%. The left ventricle has normal function. The left ventricle has no regional wall motion abnormalities. The left ventricular internal cavity size was normal in size. There is  no left ventricular hypertrophy. Left ventricular diastolic parameters are indeterminate. Normal left ventricular filling pressure. Right Ventricle: The right ventricular size is normal. No increase in right ventricular wall thickness. Right ventricular systolic function is normal. There is normal pulmonary artery systolic pressure. The tricuspid regurgitant velocity is 2.53 m/s, and  with an assumed right atrial pressure of 3 mmHg, the estimated right ventricular systolic pressure is 43.1 mmHg. Left Atrium: Left atrial size was normal in size. Right Atrium: Right atrial size was normal in size. Pericardium: There is no evidence of pericardial effusion. Mitral Valve: The mitral valve is normal in structure. Mild mitral valve  regurgitation. No evidence of mitral valve stenosis. Tricuspid Valve: The tricuspid valve is normal in structure. Tricuspid valve regurgitation is mild to moderate. No evidence of tricuspid stenosis. Aortic Valve: The aortic valve is normal in structure. Aortic valve regurgitation is mild. Aortic regurgitation PHT measures 653 msec. Aortic valve sclerosis/calcification is present, without any evidence of aortic stenosis. Pulmonic Valve: The pulmonic valve was normal in structure. Pulmonic valve regurgitation is not visualized. No evidence of pulmonic stenosis. Aorta: The aortic root is normal in size and structure. Venous: The inferior vena cava is normal in size with greater than 50% respiratory variability, suggesting right atrial pressure of 3 mmHg. IAS/Shunts: No atrial level shunt detected by color flow Doppler. Additional Comments: A device lead is visualized.  LEFT VENTRICLE PLAX 2D LVIDd:         3.30 cm   Diastology LVIDs:         2.20 cm   LV e' medial:    5.70 cm/s LV PW:         1.20 cm   LV E/e' medial:  10.9 LV IVS:        1.00 cm   LV e' lateral:   11.50 cm/s LVOT diam:     2.20 cm   LV E/e' lateral: 5.4 LV SV:         76 LV SV Index:   39 LVOT Area:     3.80 cm  RIGHT VENTRICLE RV Basal diam:  3.70 cm RV S prime:     11.40 cm/s TAPSE (M-mode): 3.0 cm RVSP:           28.6 mmHg LEFT ATRIUM             Index        RIGHT ATRIUM           Index LA diam:        3.70 cm 1.90 cm/m   RA Pressure: 3.00 mmHg LA Vol (A2C):   43.1 ml 22.14 ml/m  RA Area:     16.30 cm LA Vol (A4C):   52.4 ml 26.91 ml/m  RA Volume:   37.70 ml  19.36  ml/m LA Biplane Vol: 48.9 ml 25.12 ml/m  AORTIC VALVE LVOT Vmax:   83.80 cm/s LVOT Vmean:  54.500 cm/s LVOT VTI:    0.201 m AI PHT:      653 msec  AORTA Ao Root diam: 3.30 cm Ao Asc diam:  3.40 cm MITRAL VALVE               TRICUSPID VALVE MV Area (PHT): 3.91 cm    TR Peak grad:   25.6 mmHg MV Decel Time: 194 msec    TR Vmax:        253.00 cm/s MV E velocity: 62.40 cm/s   Estimated RAP:  3.00 mmHg MV A velocity: 65.00 cm/s  RVSP:           28.6 mmHg MV E/A ratio:  0.96                            SHUNTS                            Systemic VTI:  0.20 m                            Systemic Diam: 2.20 cm Armanda Magic MD Electronically signed by Armanda Magic MD Signature Date/Time: 12/26/2021/8:37:11 AM    Final      Additional studies/ records that were reviewed today include:  I reviewed the recent records from [place]. I have reviewed his 2-D echo Doppler study, exercise Myoview study, laboratory, as well as daily  Monitors with critical notification from the event monitor prior to the insertion of the pacemaker.  I personally reviewed the catheterization findings, as well as his pacemaker implantation report.  Subsequent office visits with Dr. Royann Shivers have been reviewed as well as recent laboratory.   ECHO: 12/26/2021  1. Left ventricular ejection fraction, by estimation, is 60 to 65%. The  left ventricle has normal function. The left ventricle has no regional  wall motion abnormalities. Left ventricular diastolic parameters are  indeterminate.   2. Right ventricular systolic function is normal. The right ventricular  size is normal. There is normal pulmonary artery systolic pressure. The  estimated right ventricular systolic pressure is 28.6 mmHg.   3. The mitral valve is normal in structure. Mild mitral valve  regurgitation. No evidence of mitral stenosis.   4. Tricuspid valve regurgitation is mild to moderate.   5. The aortic valve is normal in structure. Aortic valve regurgitation is  mild. Aortic valve sclerosis/calcification is present, without any  evidence of aortic stenosis.   6. The inferior vena cava is normal in size with greater than 50%  respiratory variability, suggesting right atrial pressure of 3 mmHg.   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 Essential hypertension   3. Aortic valve sclerosis   4. Hyperlipidemia with target LDL less  than 70   5. SSS (sick sinus syndrome) (HCC)   6. Pacemaker   7. Type 2 diabetes mellitus without complication, without long-term current use of insulin Travis [name])     PLAN:  Travis [name] is a very pleasant, young appearing active 82 year-old gentleman who has a long-standing history of significant exercise and previously had exercised for at least 1-2 hours per day 6 days per week.  When I initially saw him he was running 5 to 6  miles at a time and was doing spin class.  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  Prior to his pacemaker cardiac catheterization revealed a 60 to 70% calcified LAD stenosis with normal LV function.  Subsequently, he has continued to be asymptomatic.  We have been very aggressive with lipid-lowering therapy and LDL cholesterol have been running in the 40s.  His most recent lipid panel from November 21, 2021 shows total cholesterol 114, HDL 54, LDL 47, and triglycerides 63.  He has been maintained on a blood pressure regimen consisting of amlodipine 10 mg, hydrochlorothiazide 12.5 mg, lisinopril 20 mg daily.  He can continues to be on atorvastatin 40 mg daily for his hyperlipidemia.  He has mild diabetes on metformin 500 mg twice a day.  He has sick sinus syndrome and underwent successful pacemaker insertion with a dual-chamber Medtronic Azor XT permanent pacemaker on Jun 10, 2016.  Presently, Travis [name] is remaining stable.  He is not having any anginal symptoms.  He exercises almost 7 days/week typically running 4 days and going to the gym the other 3 days and is without symptoms.  He sees Dr. Wylene Simmer for primary care.  I reviewed his most recent echo Doppler study with him which continues to show normal systolic function with EF 60 to 65%.  He has mild MR, mild to moderate TR, and aortic sclerosis with mild AR.  Clinically he is stable.  He will continue current  therapy.  I will see him in 1 year for reevaluation or sooner as needed.   Medication Adjustments/Labs and Tests Ordered: Current medicines are reviewed at length with the patient today.  Concerns regarding medicines are outlined above.  Medication changes, Labs and Tests ordered today are listed in the Patient Instructions below. Patient Instructions  Medication Instructions:  No changes *If you need a refill on your cardiac medications before your next appointment, please call your pharmacy*   Lab Work: None  If you have labs (blood work) drawn today and your tests are completely normal, you will receive your results only by: MyChart Message (if you have MyChart) OR A paper copy in the mail If you have any lab test that is abnormal or we need to change your treatment, we will call you to review the results.   Testing/Procedures: None   Follow-Up: At [facility], you and your health needs are our priority.  As part of our continuing mission to provide you with exceptional heart care, we have created designated Provider Care Teams.  These Care Teams include your primary Cardiologist (physician) and Advanced Practice Providers (APPs -  Physician Assistants and Nurse Practitioners) who all work together to provide you with the care you need, when you need it.  We recommend signing up for the patient portal called "MyChart".  Sign up information is provided on this After Visit Summary.  MyChart is used to connect with patients for Virtual Visits (Telemedicine).  Patients are able to view lab/test results, encounter notes, upcoming appointments, etc.  Non-urgent messages can be sent to your provider as well.   To learn more about what you can do with MyChart, go to [email or web address].    Your next appointment:   1 year(s)  The format for your next appointment:   In Person  Provider:   Nicki Guadalajara, MD      Important Information About Sugar  Signed, Nicki Guadalajarahomas Jasmina Gendron, MD  01/19/2022 10:49 AM    [facility] [address], [address], [place], [place]  [id number] Phone: [phone]

## 2022-01-17 ENCOUNTER — Encounter: Payer: Self-pay | Admitting: Cardiovascular Disease

## 2022-01-19 ENCOUNTER — Ambulatory Visit: Payer: Medicare Other | Attending: Cardiovascular Disease | Admitting: Cardiovascular Disease

## 2022-01-19 ENCOUNTER — Encounter: Payer: Self-pay | Admitting: Cardiovascular Disease

## 2022-01-19 VITALS — BP 112/62 | HR 85 | Ht 71.0 in | Wt 155.2 lb

## 2022-01-19 DIAGNOSIS — Z950 Presence of cardiac pacemaker: Secondary | ICD-10-CM | POA: Diagnosis present

## 2022-01-19 DIAGNOSIS — I2510 Atherosclerotic heart disease of native coronary artery without angina pectoris: Secondary | ICD-10-CM

## 2022-01-19 DIAGNOSIS — I495 Sick sinus syndrome: Secondary | ICD-10-CM

## 2022-01-19 DIAGNOSIS — E119 Type 2 diabetes mellitus without complications: Secondary | ICD-10-CM

## 2022-01-19 DIAGNOSIS — I10 Essential (primary) hypertension: Secondary | ICD-10-CM

## 2022-01-19 DIAGNOSIS — I358 Other nonrheumatic aortic valve disorders: Secondary | ICD-10-CM

## 2022-01-19 DIAGNOSIS — E785 Hyperlipidemia, unspecified: Secondary | ICD-10-CM | POA: Diagnosis not present

## 2022-01-19 NOTE — Patient Instructions (Signed)
Medication Instructions:  No changes *If you need a refill on your cardiac medications before your next appointment, please call your pharmacy*   Lab Work: None  If you have labs (blood work) drawn today and your tests are completely normal, you will receive your results only by: [place] (if you have MyChart) OR A paper copy in the mail If you have any lab test that is abnormal or we need to change your treatment, we will call you to review the results.   Testing/Procedures: None   Follow-Up: At [facility], you and your health needs are our priority.  As part of our continuing mission to provide you with exceptional heart care, we have created designated Provider Care Teams.  These Care Teams include your primary Cardiologist (physician) and Advanced Practice Providers (APPs -  Physician Assistants and Nurse Practitioners) who all work together to provide you with the care you need, when you need it.  We recommend signing up for the patient portal called "MyChart".  Sign up information is provided on this After Visit Summary.  MyChart is used to connect with patients for Virtual Visits (Telemedicine).  Patients are able to view lab/test results, encounter notes, upcoming appointments, etc.  Non-urgent messages can be sent to your provider as well.   To learn more about what you can do with MyChart, go to [email or web address].    Your next appointment:   1 year(s)  The format for your next appointment:   In Person  Provider:   Shelva Majestic, MD      Important Information About Sugar

## 2022-01-26 ENCOUNTER — Other Ambulatory Visit: Payer: Self-pay | Admitting: Cardiovascular Disease

## 2022-02-02 NOTE — Progress Notes (Signed)
Remote pacemaker transmission.   

## 2022-02-04 ENCOUNTER — Ambulatory Visit (INDEPENDENT_AMBULATORY_CARE_PROVIDER_SITE_OTHER): Payer: Medicare Other | Admitting: Podiatry

## 2022-02-04 ENCOUNTER — Encounter: Payer: Self-pay | Admitting: Podiatry

## 2022-02-04 VITALS — BP 128/73

## 2022-02-04 DIAGNOSIS — B351 Tinea unguium: Secondary | ICD-10-CM | POA: Diagnosis not present

## 2022-02-04 DIAGNOSIS — M79675 Pain in left toe(s): Secondary | ICD-10-CM | POA: Diagnosis not present

## 2022-02-04 DIAGNOSIS — E118 Type 2 diabetes mellitus with unspecified complications: Secondary | ICD-10-CM | POA: Diagnosis not present

## 2022-02-04 DIAGNOSIS — M79674 Pain in right toe(s): Secondary | ICD-10-CM

## 2022-02-04 DIAGNOSIS — L84 Corns and callosities: Secondary | ICD-10-CM

## 2022-02-04 NOTE — Progress Notes (Signed)
  Subjective:  Patient ID: Travis [name], male    DOB: Feb 21, 1940,  MRN: [id number]  Travis [name] presents to clinic today for preventative diabetic foot care and callus(es) b/l lower extremities and painful thick toenails that are difficult to trim. Painful toenails interfere with ambulation. Aggravating factors include wearing enclosed shoe gear. Pain is relieved with periodic professional debridement. Painful calluses are aggravated when weightbearing with and without shoegear. Pain is relieved with periodic professional debridement.  Chief Complaint  Patient presents with   Nail Problem    DFC BS-do not check everyday A1C-6.3 PCP VST-T[facility]c PCP VST-2 months ago   New problem(s): None.   He states he's starting to feel more pain on the bottom of his feet when wearing dress shoes.  PCP is Tisovec, Fransico Him, MD.  No Known Allergies  Review of Systems: Negative except as noted in the HPI.  Objective: No changes noted in today's physical examination. Vitals:   02/04/22 0810  BP: 128/73   Travis [name] is a pleasant 82 y.o. male WD, WN in NAD. AAO x 3.  Vascular Examination: CFT <3 seconds b/l LE. Palpable DP pulse(s) b/l LE. Palpable PT pulse(s) b/l LE. Pedal hair absent. No pain with calf compression b/l. Lower extremity skin temperature gradient within normal limits. No edema noted b/l LE. Varicosities present b/l. No cyanosis or clubbing noted b/l LE.  Dermatological Examination: Pedal skin thin, shiny and atrophic b/l LE. No open wounds b/l LE. No interdigital macerations noted b/l LE. Toenails 1-5 b/l elongated, discolored, dystrophic, thickened, crumbly with subungual debris and tenderness to dorsal palpation.   Hyperkeratotic lesion(s) spine callus submet head 1 b/l; submet head 5 b/l nearly resolved.  No erythema, no edema, no drainage, no fluctuance.  Neurological Examination: Protective sensation intact 5/5 intact bilaterally with 10g monofilament b/l. Vibratory  sensation intact b/l. Proprioception intact bilaterally.  Musculoskeletal Examination: Normal muscle strength 5/5 to all lower extremity muscle groups bilaterally. Wearing New Balance sneakers today. HAV with bunion deformity noted b/l LE.Marland Kitchen No pain, crepitus or joint limitation noted with ROM b/l LE.  Patient ambulates independently without assistive aids.  Assessment/Plan: 1. Pain due to onychomycosis of toenails of both feet   2. Callus   3. Type 2 diabetes mellitus with complication, without long-term current use of insulin ([place])     -Patient was evaluated and treated. All patient's and/or POA's questions/concerns answered on today's visit. -We discussed dress shoes and I recommended Skechers for comfort. He related understanding. I have asked him to bring his insoles for his running shoes to his next visit so I can offload them for his calluses. He related understanding. -Continue supportive shoe gear daily. -Toenails 1-5 b/l were debrided in length and girth with sterile nail nippers and dremel without iatrogenic bleeding.  -Callus(es) submet head 1 b/l, submet head 3 left foot, submet head 4 right foot, and submet head 5 b/l pared utilizing sterile scalpel blade without complication or incident. Total number debrided =6. -Recommended Skechers shoes with stretchable uppers and memory foam insoles. They can be purchased at [employer] or [email or web address].  -Patient/POA to call should there be question/concern in the interim.   Return in about 3 months (around 05/06/2022).  Marzetta Board, DPM

## 2022-02-21 ENCOUNTER — Other Ambulatory Visit: Payer: Self-pay | Admitting: Cardiovascular Disease

## 2022-04-17 ENCOUNTER — Ambulatory Visit (INDEPENDENT_AMBULATORY_CARE_PROVIDER_SITE_OTHER): Payer: Medicare Other

## 2022-04-17 DIAGNOSIS — I495 Sick sinus syndrome: Secondary | ICD-10-CM | POA: Diagnosis not present

## 2022-04-19 LAB — CUP PACEART REMOTE DEVICE CHECK
Battery Remaining Longevity: 102 mo
Battery Voltage: 2.99 V
Brady Statistic AP VP Percent: 0.42 %
Brady Statistic AP VS Percent: 72.34 %
Brady Statistic AS VP Percent: 0.03 %
Brady Statistic AS VS Percent: 27.21 %
Brady Statistic RA Percent Paced: 72.83 %
Brady Statistic RV Percent Paced: 0.45 %
Date Time Interrogation Session: 20240405013726
Implantable Lead Connection Status: 753985
Implantable Lead Connection Status: 753985
Implantable Lead Implant Date: 20180530
Implantable Lead Implant Date: 20180530
Implantable Lead Location: 753859
Implantable Lead Location: 753860
Implantable Lead Model: 5076
Implantable Lead Model: 5076
Implantable Pulse Generator Implant Date: 20180530
Lead Channel Impedance Value: 304 Ohm
Lead Channel Impedance Value: 342 Ohm
Lead Channel Impedance Value: 380 Ohm
Lead Channel Impedance Value: 437 Ohm
Lead Channel Pacing Threshold Amplitude: 0.625 V
Lead Channel Pacing Threshold Amplitude: 0.625 V
Lead Channel Pacing Threshold Pulse Width: 0.4 ms
Lead Channel Pacing Threshold Pulse Width: 0.4 ms
Lead Channel Sensing Intrinsic Amplitude: 15.375 mV
Lead Channel Sensing Intrinsic Amplitude: 15.375 mV
Lead Channel Sensing Intrinsic Amplitude: 4.125 mV
Lead Channel Sensing Intrinsic Amplitude: 4.125 mV
Lead Channel Setting Pacing Amplitude: 1.5 V
Lead Channel Setting Pacing Amplitude: 2 V
Lead Channel Setting Pacing Pulse Width: 0.4 ms
Lead Channel Setting Sensing Sensitivity: 2 mV
Zone Setting Status: 755011

## 2022-05-11 IMAGING — CR DG HAND COMPLETE 3+V*L*
3 series · 3 of 3 positions shown · non-contrast
Comparison: None.

CLINICAL DATA: Left hand pain after fall today.

EXAM:
LEFT HAND - COMPLETE 3+ VIEW

[hand pa]
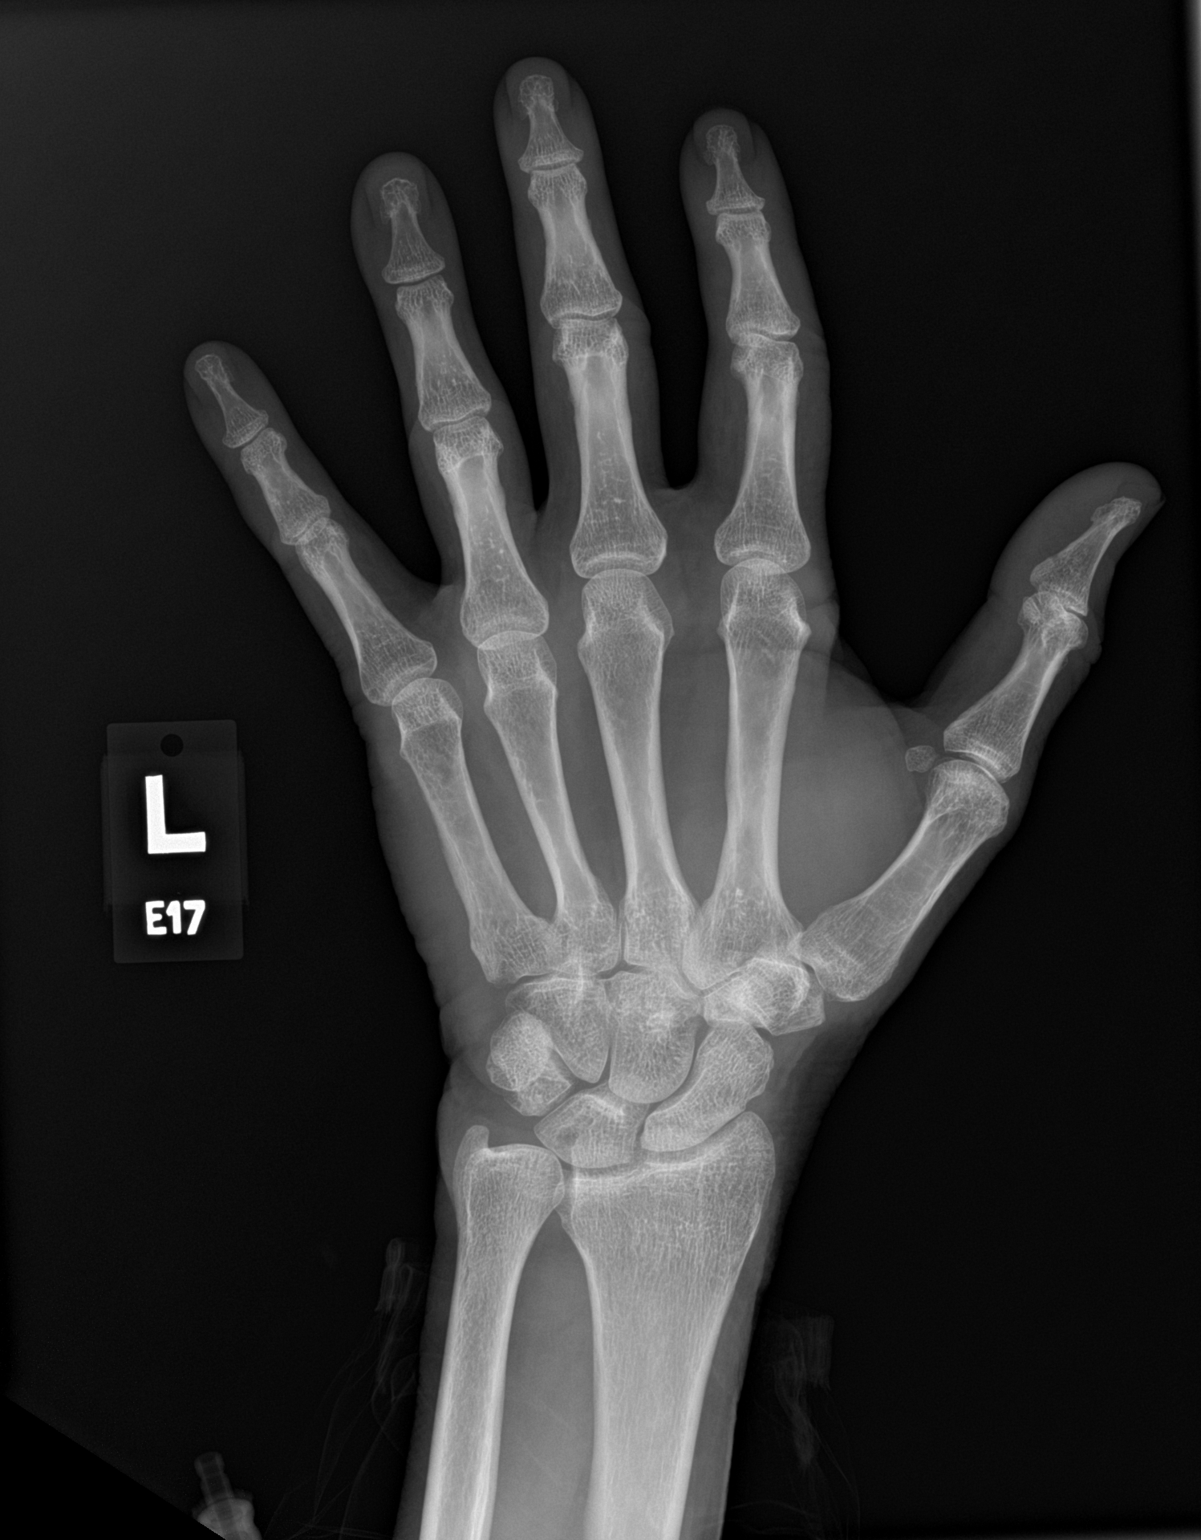

[hand obl]
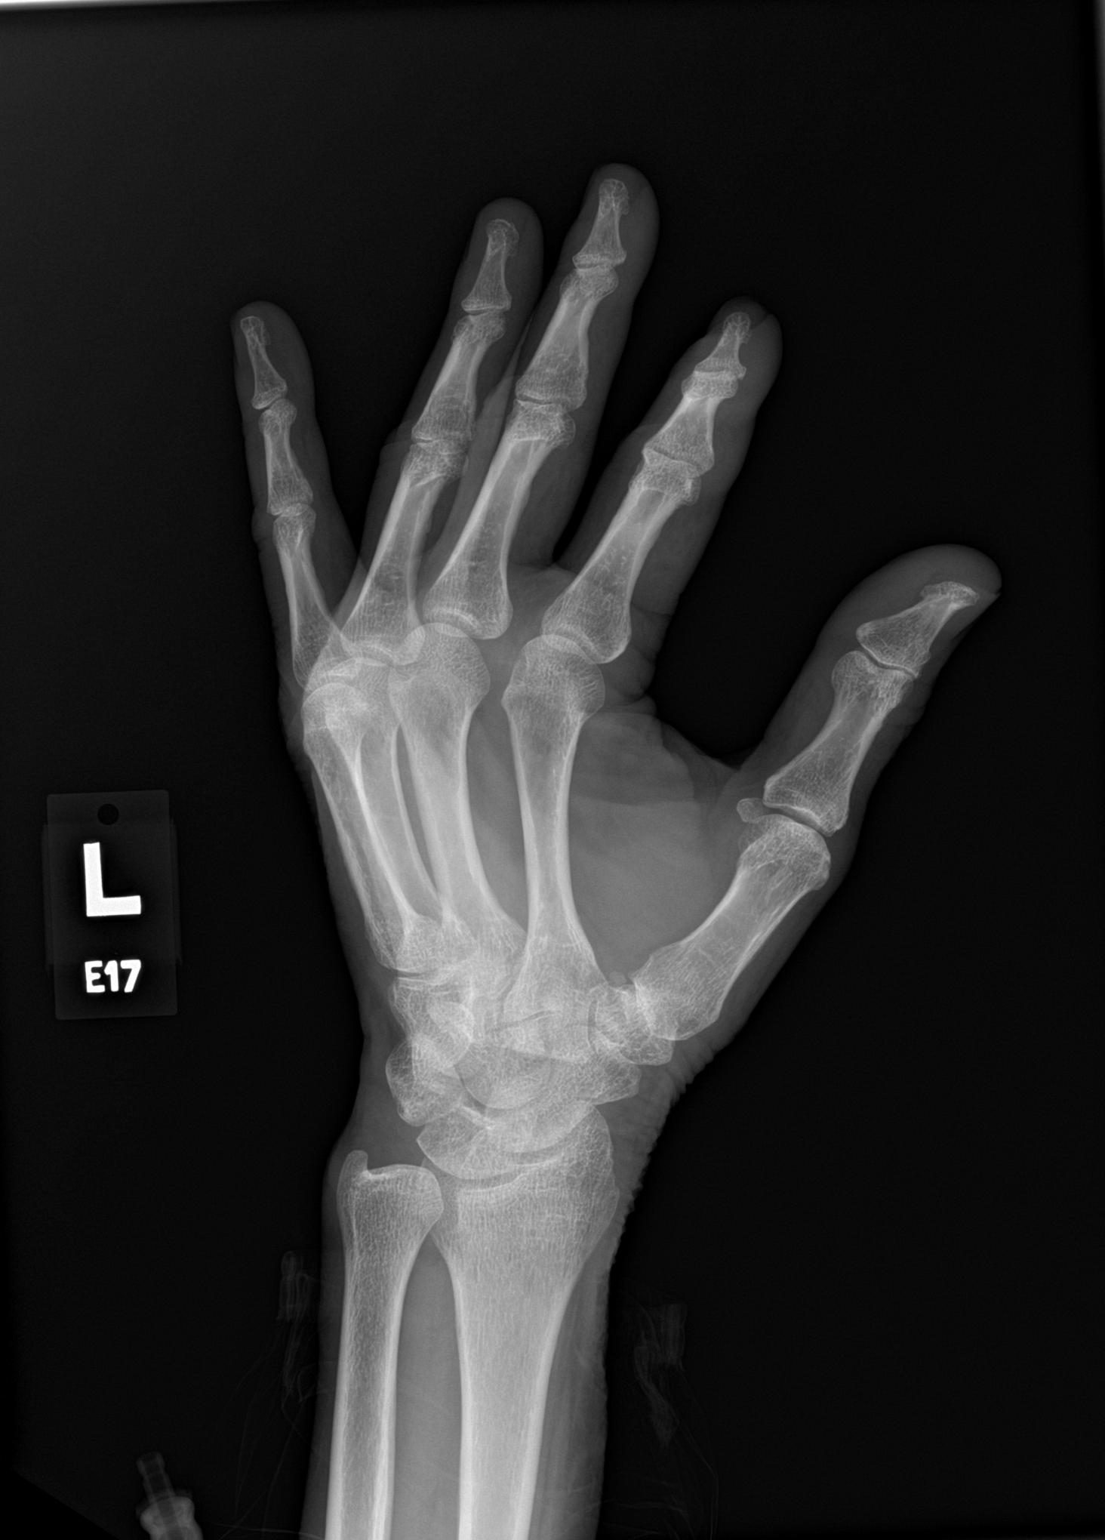

[hand lat]
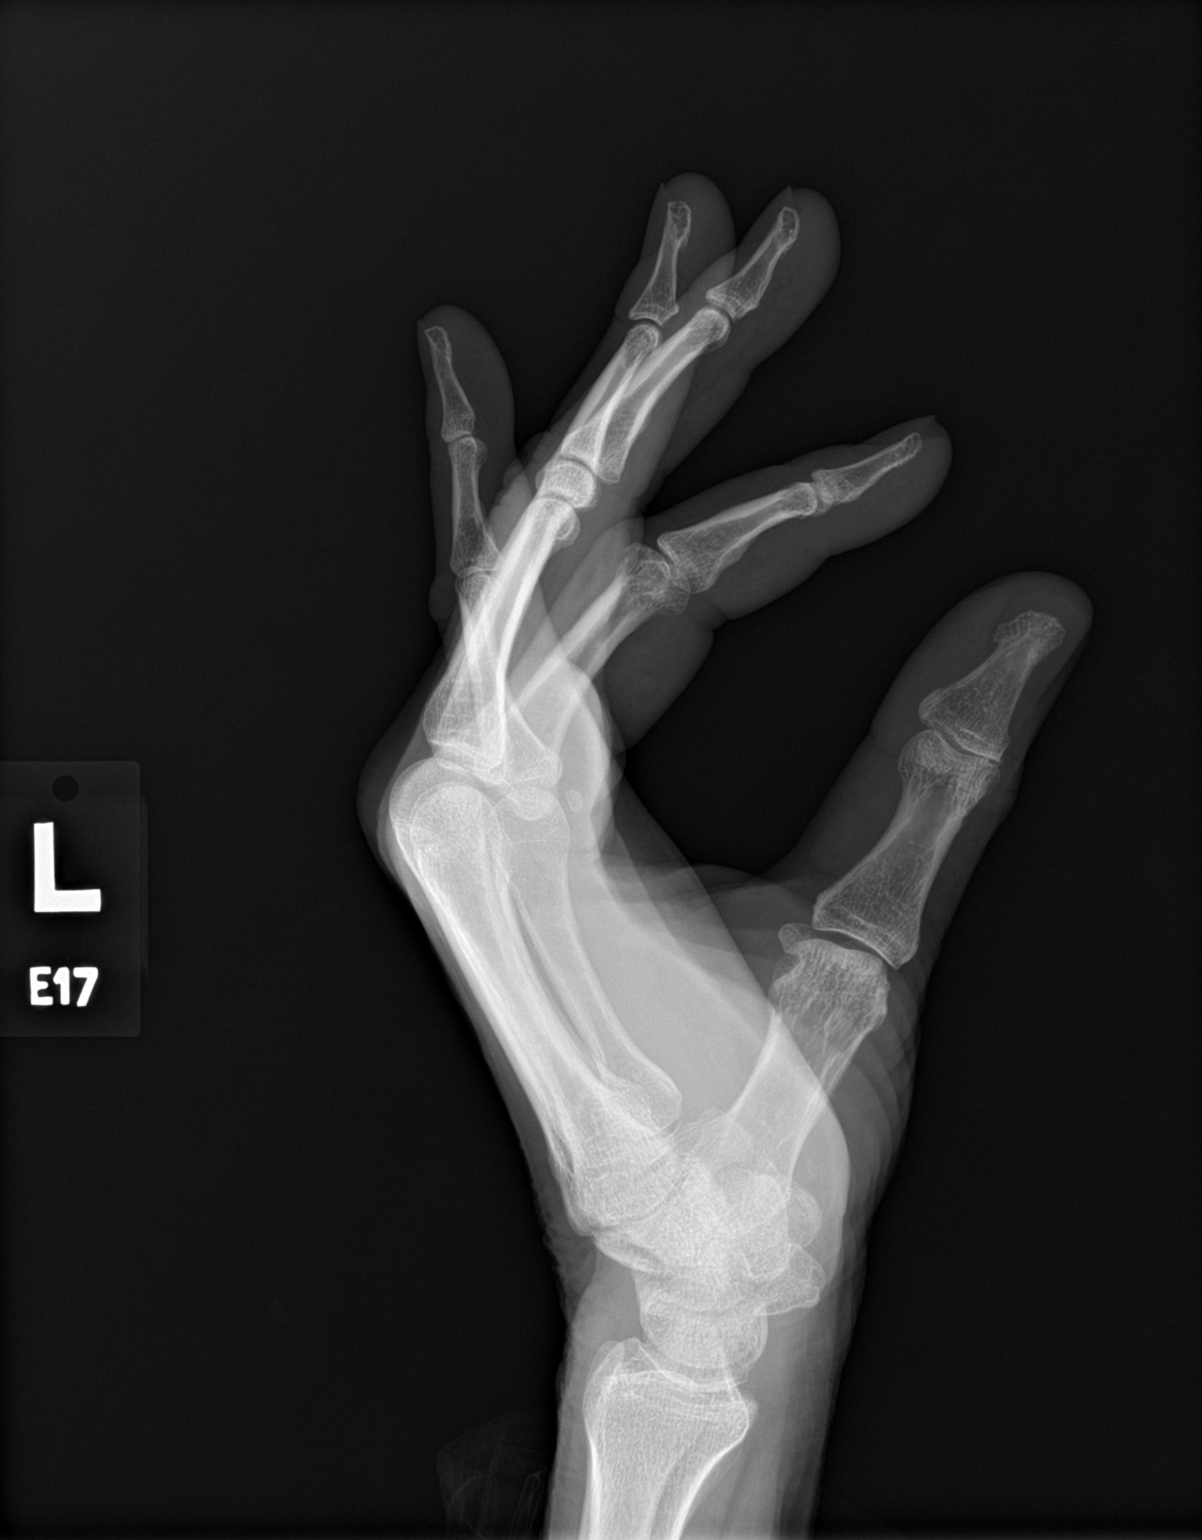

[3 of 3 positions shown; findings below may reference images not displayed]

FINDINGS: There is no evidence of fracture or dislocation. Mild degenerative
changes seen involving the first carpometacarpal joint. Soft tissues
are unremarkable.
IMPRESSION: Mild osteoarthritis of the first carpometacarpal joint. No acute
abnormality seen.

## 2022-05-11 IMAGING — CT CT CERVICAL SPINE W/O CM
3 series · 12 of 33 positions shown, 14 images · non-contrast
Comparison: None.

CLINICAL DATA: Fall.  Head trauma.

EXAM:
CT HEAD WITHOUT CONTRAST
CT MAXILLOFACIAL WITHOUT CONTRAST
CT CERVICAL SPINE WITHOUT CONTRAST
TECHNIQUE: Multidetector CT imaging of the head, cervical spine, and
maxillofacial structures were performed using the standard protocol
without intravenous contrast. Multiplanar CT image reconstructions
of the cervical spine and maxillofacial structures were also
generated.

[Series 4: c spine soft · axial · 0.47mm/px · z∈[+936,+1086]mm · 4 of 109 slices shown, 5 images]
[im 17/109  soft-tissue]
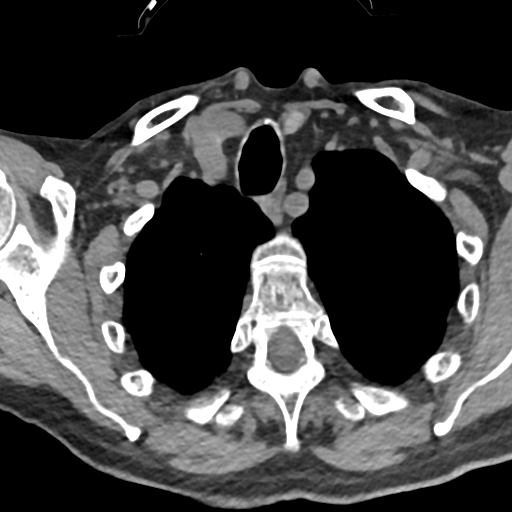
[im 17/109  bone]
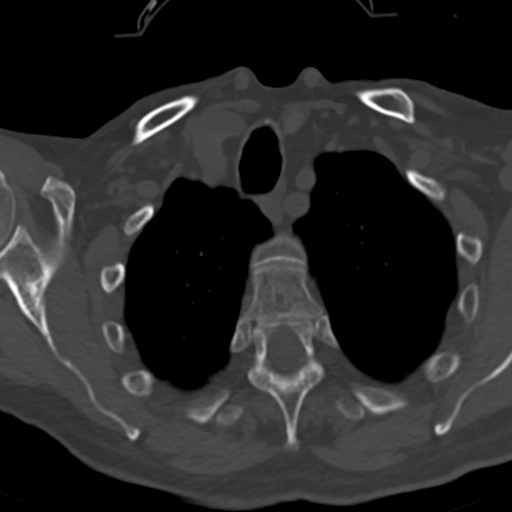
[im 42/109  bone]
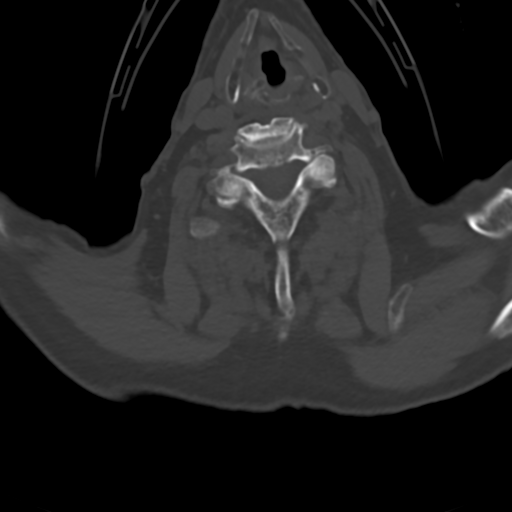
[im 67/109  bone]
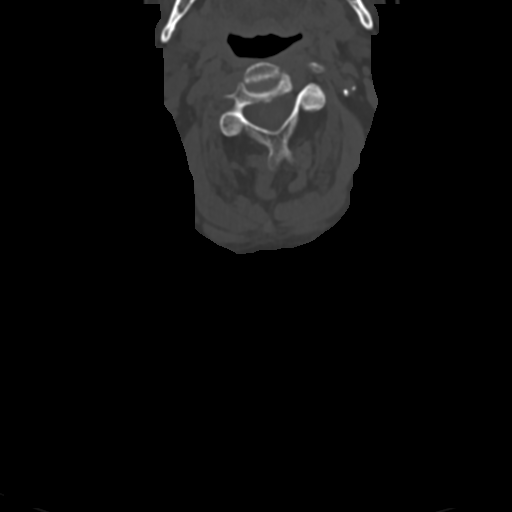
[im 92/109  bone]
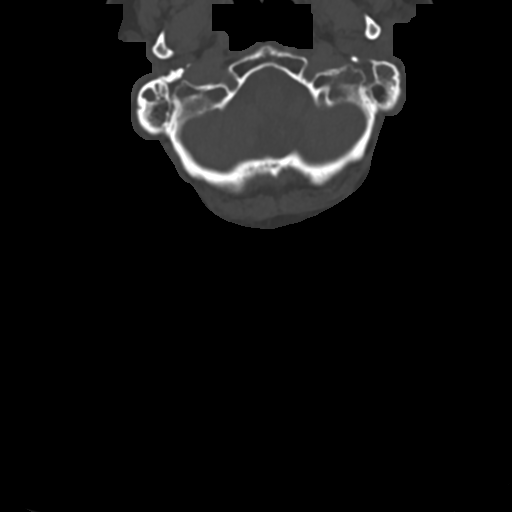

[Series 8: sag bone · sagittal · 0.36mm/px · 5 of 82 slices shown, 6 images]
[im 28/82  bone]
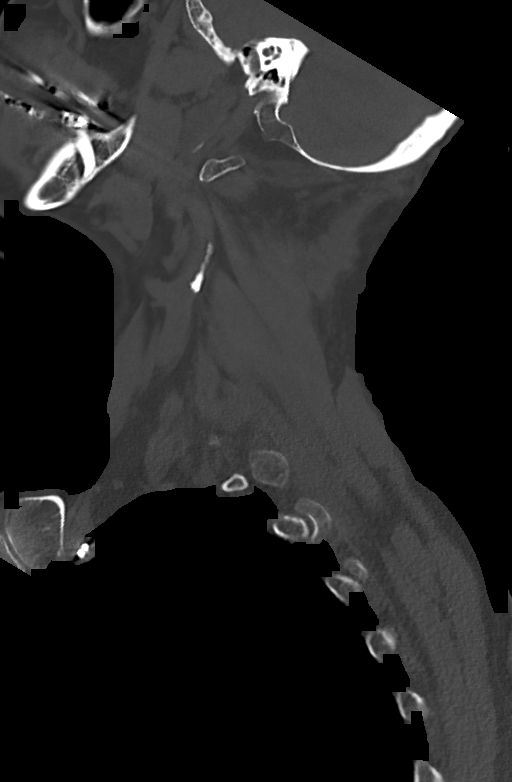
[im 34/82  bone]
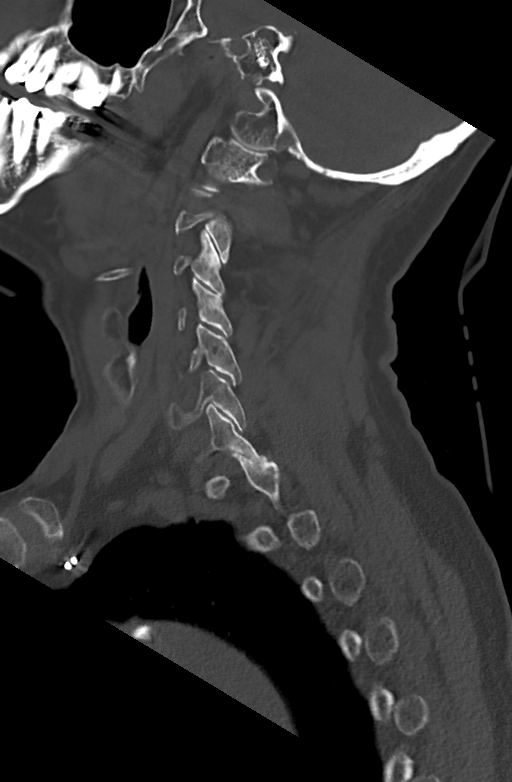
[im 41/82  soft-tissue]
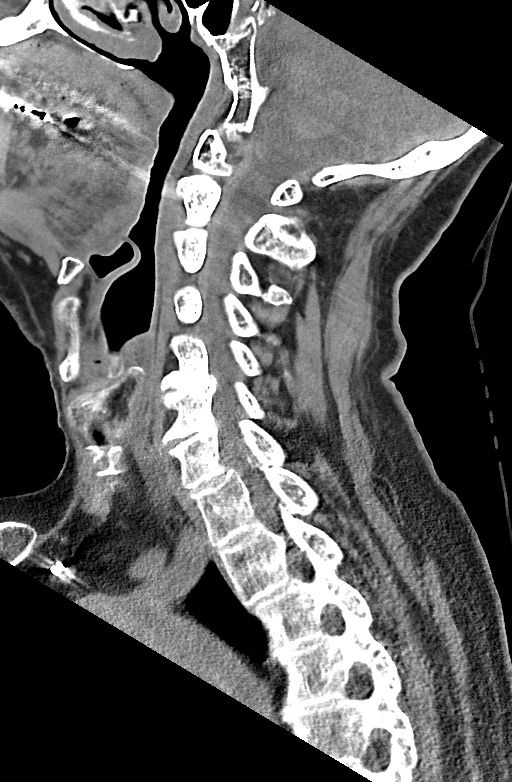
[im 41/82  bone]
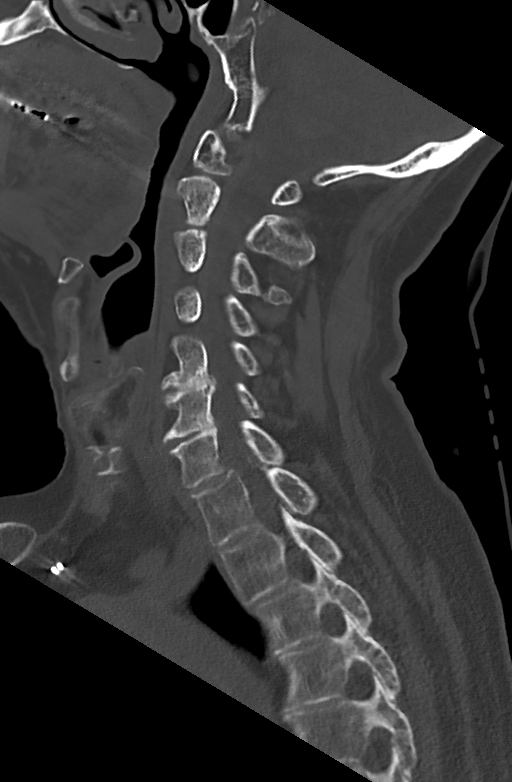
[im 48/82  bone]
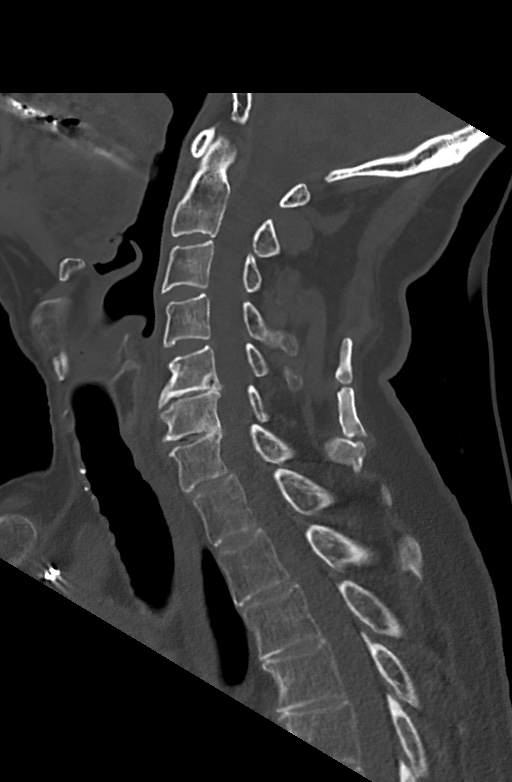
[im 55/82  bone]
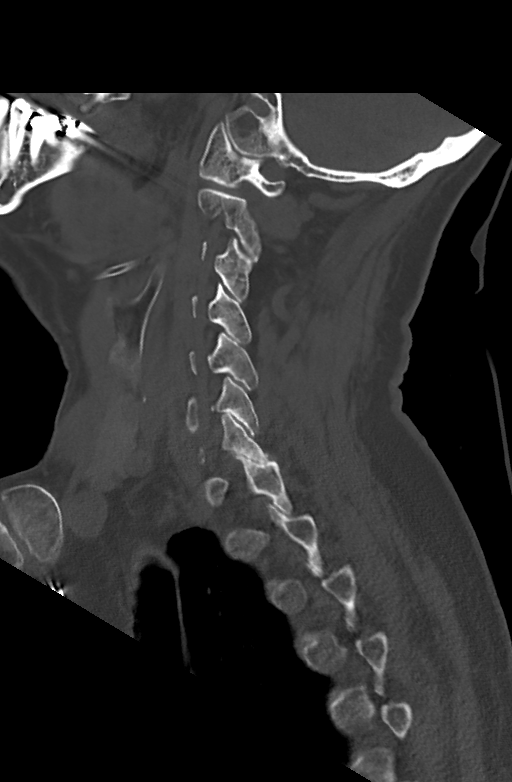

[Series 9: cor bone · coronal · 0.38mm/px · 3 of 103 slices shown]
[im 36/103  bone]
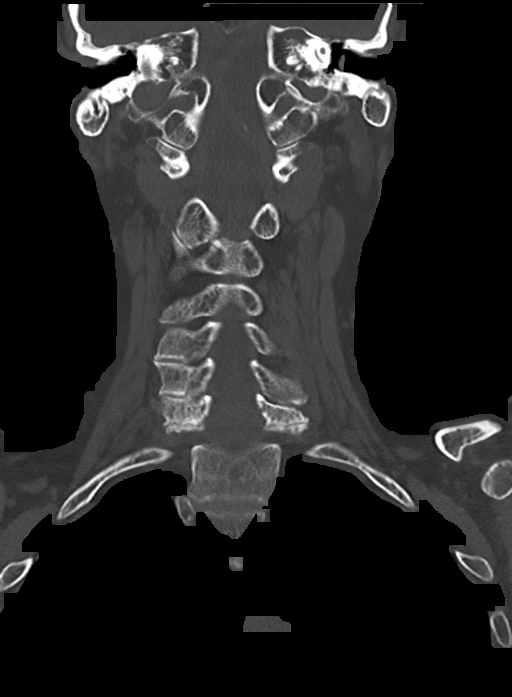
[im 46/103  bone]
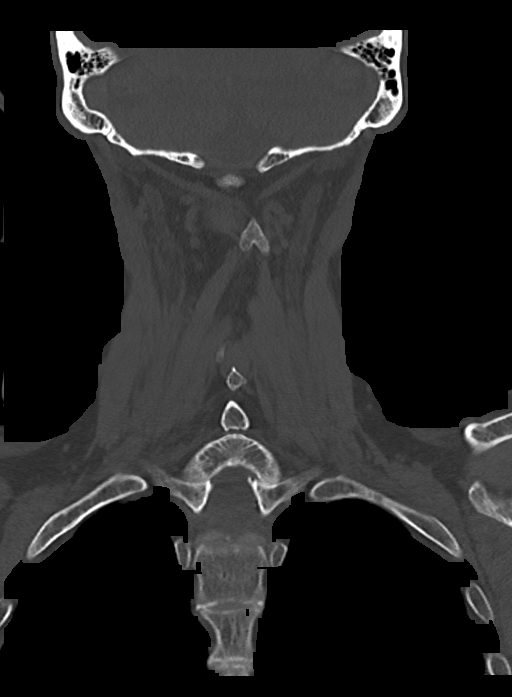
[im 57/103  bone]
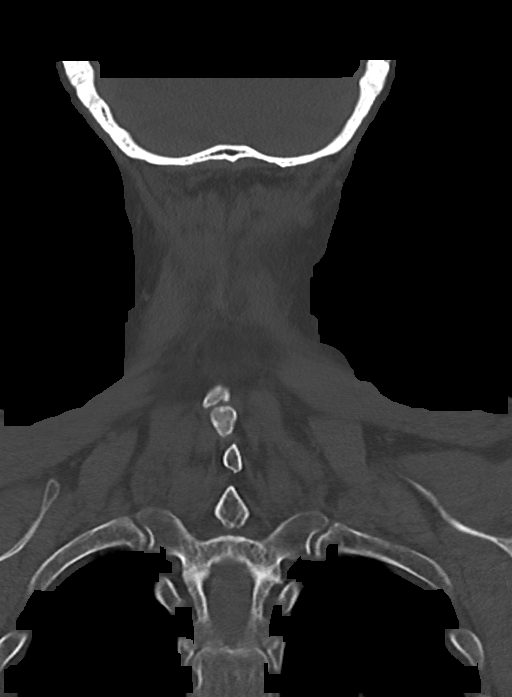

[12 of 33 positions shown; findings below may reference images not displayed]

FINDINGS: CT HEAD FINDINGS

Brain: Generalized atrophy. Negative for acute infarct, hemorrhage,
mass.

Vascular: Negative for hyperdense vessel

Skull: Negative for skull fracture

Other: None

CT MAXILLOFACIAL FINDINGS

Osseous: Mildly displaced nasal bone fractures. No fracture of the
nasal septum. No other facial fracture.

Poor dentition with numerous caries.

Orbits: Negative for orbital mass or hematoma.

Sinuses: Mucosal edema in the paranasal sinuses most prominent right
maxillary sinus. No air-fluid level. Small right mastoid effusion

Soft tissues: Diffuse soft tissue swelling of the nose and forehead.

CT CERVICAL SPINE FINDINGS

Alignment: Normal

Skull base and vertebrae: Negative for fracture

Soft tissues and spinal canal: Atherosclerotic calcification in the
carotid artery bilaterally.

Disc levels: Disc degeneration and spurring C5-6 and C6-7 without
significant stenosis.

Upper chest: Lung apices clear bilaterally. Left-sided transvenous
pacemaker.

Other: None
IMPRESSION: 1. No acute intracranial abnormality
2. Nasal bone fracture with overlying soft tissue swelling. No other
facial fracture
3. Negative for cervical spine fracture.

## 2022-05-13 ENCOUNTER — Ambulatory Visit (INDEPENDENT_AMBULATORY_CARE_PROVIDER_SITE_OTHER): Payer: Medicare Other | Admitting: Podiatry

## 2022-05-13 ENCOUNTER — Encounter: Payer: Self-pay | Admitting: Podiatry

## 2022-05-13 VITALS — BP 113/62

## 2022-05-13 DIAGNOSIS — B351 Tinea unguium: Secondary | ICD-10-CM | POA: Diagnosis not present

## 2022-05-13 DIAGNOSIS — L84 Corns and callosities: Secondary | ICD-10-CM | POA: Diagnosis not present

## 2022-05-13 DIAGNOSIS — E118 Type 2 diabetes mellitus with unspecified complications: Secondary | ICD-10-CM | POA: Diagnosis not present

## 2022-05-13 DIAGNOSIS — Q828 Other specified congenital malformations of skin: Secondary | ICD-10-CM

## 2022-05-13 DIAGNOSIS — M79675 Pain in left toe(s): Secondary | ICD-10-CM | POA: Diagnosis not present

## 2022-05-13 DIAGNOSIS — M2011 Hallux valgus (acquired), right foot: Secondary | ICD-10-CM

## 2022-05-13 DIAGNOSIS — M79674 Pain in right toe(s): Secondary | ICD-10-CM | POA: Diagnosis not present

## 2022-05-13 DIAGNOSIS — E119 Type 2 diabetes mellitus without complications: Secondary | ICD-10-CM

## 2022-05-13 NOTE — Progress Notes (Signed)
ANNUAL DIABETIC FOOT EXAM  Subjective: Travis [name] presents today for annual diabetic foot examination.  Chief Complaint  Patient presents with   Nail Problem    DFC BS-do not check A1C-6.2 PCP-[facility] PCP VST-01/2022    Patient confirms h/o diabetes.  Patient denies any h/o foot wounds.  Patient denies any numbness, tingling, burning, or pins/needle sensation in feet.  Risk factors: diabetes, HTN, CAD, hyperlipidemia, h/o tobacco use in remission.  Tisovec, Adelfa Koh, MD is patient's PCP.  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   SSS (sick sinus syndrome) (HCC) 06/10/2016   Type II diabetes mellitus (HCC)    Patient Active Problem List   Diagnosis Date Noted   Closed fracture of nasal bones 12/27/2020   Impacted cerumen of left ear 12/27/2020   Left patella fracture 12/16/2017   CAD (coronary artery disease), native coronary artery 09/14/2016   Pacemaker 06/10/2016   Paroxysmal atrial fibrillation (HCC) 06/07/2016   Sinus pause 06/07/2016   Bradycardia 06/04/2016   Groin pain 06/29/2014   Piriformis syndrome of left side 10/30/2013   Past Surgical History:  Procedure Laterality Date   APPENDECTOMY     CARDIAC CATHETERIZATION  06/10/2016   CATARACT EXTRACTION W/ INTRAOCULAR LENS IMPLANT Right    CYSTOSCOPY W/ STONE MANIPULATION     INSERT / REPLACE / REMOVE PACEMAKER  06/10/2016   dual chamber permanent pacemaker   KNEE ARTHROSCOPY W/ MENISCECTOMY Bilateral    LAPAROSCOPIC CHOLECYSTECTOMY     LEFT HEART CATH AND CORONARY ANGIOGRAPHY N/A 06/10/2016   Procedure: Left Heart Cath and Coronary Angiography;  Surgeon: Dolores Patty, MD;  Location: [facility] INVASIVE CV LAB;  Service: Cardiovascular;  Laterality: N/A;    LITHOTRIPSY     PACEMAKER IMPLANT N/A 06/10/2016   Procedure: Pacemaker Implant;  Surgeon: Thurmon Fair, MD;  Location: [facility] INVASIVE CV LAB;  Service: Cardiovascular;  Laterality: N/A;   TONSILLECTOMY     Current Outpatient Medications on File Prior to Visit  Medication Sig Dispense Refill   hydrochlorothiazide (MICROZIDE) 12.5 MG capsule TAKE ONE CAPSULE EVERY DAY. 90 capsule 3   amLODipine (NORVASC) 10 MG tablet SMARTSIG:1 By Mouth (Patient not taking: Reported on 01/15/2022)     amLODipine (NORVASC) 5 MG tablet TAKE 1 and 1/2 TABLETS ONCE DAILY. 135 tablet 1   aspirin EC 81 MG tablet Take 81 mg by mouth daily.     atorvastatin (LIPITOR) 40 MG tablet TAKE ONE TABLET BY MOUTH DAILY. 90 tablet 3   Blood Glucose Monitoring Suppl (ONETOUCH VERIO FLEX SYSTEM) w/Device KIT Use to test blood sugars 2-3x a week     Cholecalciferol (VITAMIN D3) 2000 units capsule Take 2,000 Units by mouth daily.     COMBIGAN 0.2-0.5 % ophthalmic solution Place 1 drop into both eyes 2 (two) times daily.      ezetimibe (ZETIA) 10 MG tablet TAKE ONE TABLET BY MOUTH DAILY. 90 tablet 3   glucose blood (ONETOUCH VERIO) test strip Use to test blood sugars 2-3x a week E11.9     lisinopril (ZESTRIL) 20 MG tablet Take 1 tablet by mouth daily.     metFORMIN (GLUCOPHAGE) 500 MG tablet Take 500 mg by mouth 2 (two) times daily with  a meal.      mupirocin ointment (BACTROBAN) 2 % SMARTSIG:sparingly Topical Twice Daily (Patient not taking: Reported on 01/19/2022)     No current facility-administered medications on file prior to visit.    No Known Allergies Social History   Occupational History   Not on file  Tobacco Use   Smoking status: Former    Packs/day: 0.50    Years: 25.00    Additional pack years: 0.00    Total pack years: 12.50    Types: Cigarettes    Quit date: 58    Years since quitting: 41.3   Smokeless tobacco: Never  Vaping Use   Vaping Use: Never used  Substance and Sexual Activity   Alcohol use:  Yes    Comment: 06/10/2016 "might have 2 beers/month"   Drug use: No   Sexual activity: Not on file   Family History  Problem Relation Age of Onset   Colon cancer Father    Immunization History  Administered Date(s) Administered   COVID-19, mRNA, vaccine(Comirnaty)12 years and older 12/01/2021   PFIZER Comirnaty(Gray Top)Covid-19 Tri-Sucrose Vaccine 05/23/2020   PFIZER(Purple Top)SARS-COV-2 Vaccination 01/21/2019, 02/11/2019, 11/07/2019, 10/30/2020   Respiratory Syncytial Virus Vaccine,Recomb Aduvanted(Arexvy) 12/10/2021   Tdap 09/21/2017, 11/28/2020     Review of Systems: Negative except as noted in the HPI.   Objective: Vitals:   05/13/22 0834  BP: 113/62    Travis [name] is a pleasant 82 y.o. male in NAD. AAO X 3.  Vascular Examination: CFT <3 seconds b/l LE. Palpable DP pulse(s) b/l LE. Palpable PT pulse(s) b/l LE. Pedal hair sparse. No pain with calf compression b/l. Lower extremity skin temperature gradient within normal limits. No edema noted b/l LE. No ischemia or gangrene noted b/l LE. No cyanosis or clubbing noted b/l LE.  Dermatological Examination: Pedal skin is warm and supple b/l LE. No open wounds b/l LE. No interdigital macerations noted b/l LE. Toenails 1-5 b/l elongated, discolored, dystrophic, thickened, crumbly with subungual debris and tenderness to dorsal palpation. Hyperkeratotic lesion(s) submet head 1 left foot and submet head 5 right foot.  No erythema, no edema, no drainage, no fluctuance. Porokeratotic lesion(s) right heel and submet head 3 left foot. No erythema, no edema, no drainage, no fluctuance.  Neurological Examination: Protective sensation intact 5/5 intact bilaterally with 10g monofilament b/l. Vibratory sensation intact b/l. Proprioception intact bilaterally.  Musculoskeletal Examination: Normal muscle strength 5/5 to all lower extremity muscle groups bilaterally. Hallux valgus with bunion deformity noted b/l lower extremities.. No pain,  crepitus or joint limitation noted with ROM b/l LE.  Patient ambulates independently without assistive aids.   Lab Results  Component Value Date   HGBA1C 7.7 (H) 12/17/2017   ADA Risk Categorization: Low Risk :  Patient has all of the following: Intact protective sensation No prior foot ulcer  No severe deformity Pedal pulses present  Assessment: No diagnosis found.   Plan: -Patient was evaluated and treated. All patient's and/or POA's questions/concerns answered on today's visit. -Diabetic foot examination performed today. -Continue diabetic foot care principles: inspect feet daily, monitor glucose as recommended by PCP and/or Endocrinologist, and follow prescribed diet per PCP, Endocrinologist and/or dietician. -Continue supportive shoe gear daily. -Toenails 1-5 bilaterally were debrided in length and girth with sterile nail nippers and dremel. Pinpoint bleeding of L 5th toe addressed with Lumicain Hemostatic Solution, cleansed with alcohol. Triple antibiotic ointment applied. Patient/careigver instructed to apply Neosporin Cream once daily for 7 days. -Callus(es) submet head 1 left foot and submet head  5 right foot pared utilizing sterile scalpel blade without complication or incident. Total number debrided =2. -Porokeratotic lesion(s) right heel and submet head 3 left foot pared and enucleated with sterile currette without incident. Total number of lesions debrided=2. -Patient/POA to call should there be question/concern in the interim. Return in about 3 months (around 08/13/2022).  Freddie Breech, DPM

## 2022-05-20 NOTE — Progress Notes (Signed)
Remote pacemaker transmission.   

## 2022-07-17 ENCOUNTER — Ambulatory Visit (INDEPENDENT_AMBULATORY_CARE_PROVIDER_SITE_OTHER): Payer: Medicare Other

## 2022-07-17 DIAGNOSIS — I495 Sick sinus syndrome: Secondary | ICD-10-CM

## 2022-07-17 LAB — CUP PACEART REMOTE DEVICE CHECK
Battery Remaining Longevity: 99 mo
Battery Voltage: 2.99 V
Brady Statistic AP VP Percent: 0.6 %
Brady Statistic AP VS Percent: 79.88 %
Brady Statistic AS VP Percent: 0.03 %
Brady Statistic AS VS Percent: 19.5 %
Brady Statistic RA Percent Paced: 80.52 %
Brady Statistic RV Percent Paced: 0.63 %
Date Time Interrogation Session: 20240705060355
Implantable Lead Connection Status: 753985
Implantable Lead Connection Status: 753985
Implantable Lead Implant Date: 20180530
Implantable Lead Implant Date: 20180530
Implantable Lead Location: 753859
Implantable Lead Location: 753860
Implantable Lead Model: 5076
Implantable Lead Model: 5076
Implantable Pulse Generator Implant Date: 20180530
Lead Channel Impedance Value: 285 Ohm
Lead Channel Impedance Value: 323 Ohm
Lead Channel Impedance Value: 380 Ohm
Lead Channel Impedance Value: 437 Ohm
Lead Channel Pacing Threshold Amplitude: 0.625 V
Lead Channel Pacing Threshold Amplitude: 0.625 V
Lead Channel Pacing Threshold Pulse Width: 0.4 ms
Lead Channel Pacing Threshold Pulse Width: 0.4 ms
Lead Channel Sensing Intrinsic Amplitude: 13.875 mV
Lead Channel Sensing Intrinsic Amplitude: 13.875 mV
Lead Channel Sensing Intrinsic Amplitude: 3.625 mV
Lead Channel Sensing Intrinsic Amplitude: 3.625 mV
Lead Channel Setting Pacing Amplitude: 1.5 V
Lead Channel Setting Pacing Amplitude: 2 V
Lead Channel Setting Pacing Pulse Width: 0.4 ms
Lead Channel Setting Sensing Sensitivity: 2 mV
Zone Setting Status: 755011

## 2022-07-31 ENCOUNTER — Other Ambulatory Visit: Payer: Self-pay | Admitting: Cardiovascular Disease

## 2022-07-31 NOTE — Progress Notes (Signed)
Remote pacemaker transmission.   

## 2022-08-19 ENCOUNTER — Other Ambulatory Visit: Payer: Self-pay | Admitting: Cardiovascular Disease

## 2022-08-24 ENCOUNTER — Ambulatory Visit (INDEPENDENT_AMBULATORY_CARE_PROVIDER_SITE_OTHER): Payer: Medicare Other | Admitting: Podiatry

## 2022-08-24 ENCOUNTER — Encounter: Payer: Self-pay | Admitting: Podiatry

## 2022-08-24 ENCOUNTER — Ambulatory Visit: Payer: Medicare Other | Admitting: Podiatry

## 2022-08-24 DIAGNOSIS — E119 Type 2 diabetes mellitus without complications: Secondary | ICD-10-CM | POA: Diagnosis not present

## 2022-08-24 DIAGNOSIS — Q828 Other specified congenital malformations of skin: Secondary | ICD-10-CM | POA: Diagnosis not present

## 2022-08-24 DIAGNOSIS — B351 Tinea unguium: Secondary | ICD-10-CM | POA: Diagnosis not present

## 2022-08-24 DIAGNOSIS — M79675 Pain in left toe(s): Secondary | ICD-10-CM | POA: Diagnosis not present

## 2022-08-24 DIAGNOSIS — M79674 Pain in right toe(s): Secondary | ICD-10-CM

## 2022-08-24 NOTE — Progress Notes (Signed)
This patient returns to my office for at risk foot care.  This patient requires this care by a professional since this patient will be at risk due to having diabetes.  This patient is unable to cut nails himself since the patient cannot reach his nails.These nails are painful walking and wearing shoes. He has painful calluses both feet. This patient presents for at risk foot care today.  General Appearance  Alert, conversant and in no acute stress.  Vascular  Dorsalis pedis and posterior tibial  pulses are palpable  bilaterally.  Capillary return is within normal limits  bilaterally. Temperature is within normal limits  bilaterally.  Neurologic  Senn-Weinstein monofilament wire test within normal limits  bilaterally. Muscle power within normal limits bilaterally.  Nails Thick disfigured discolored nails with subungual debris  from hallux to fifth toes bilaterally. No evidence of bacterial infection or drainage bilaterally.  Orthopedic  No limitations of motion  feet .  No crepitus or effusions noted.  No bony pathology or digital deformities noted.  Skin  normotropic skin with no porokeratosis noted bilaterally.  No signs of infections or ulcers noted.   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  Porokeratosis right heel.  Onychomycosis  Pain in right toes  Pain in left toes  Consent was obtained for treatment procedures.   Mechanical debridement of nails 1-5  bilaterally performed with a nail nipper.  Filed with dremel without incident.    Return office visit    3 months                  Told patient to return for periodic foot care and evaluation due to potential at risk complications.   Helane Gunther DPM

## 2022-09-07 ENCOUNTER — Ambulatory Visit: Payer: Medicare Other | Admitting: Podiatry

## 2022-10-11 ENCOUNTER — Other Ambulatory Visit: Payer: Self-pay

## 2022-10-11 ENCOUNTER — Emergency Department (HOSPITAL_BASED_OUTPATIENT_CLINIC_OR_DEPARTMENT_OTHER)
Admission: EM | Admit: 2022-10-11 | Discharge: 2022-10-11 | Disposition: A | Discharge status: Home / Self Care | Payer: Medicare Other | Attending: Emergency Medicine | Admitting: Emergency Medicine

## 2022-10-11 ENCOUNTER — Encounter (HOSPITAL_BASED_OUTPATIENT_CLINIC_OR_DEPARTMENT_OTHER): Payer: Self-pay | Admitting: Emergency Medicine

## 2022-10-11 ENCOUNTER — Emergency Department (HOSPITAL_BASED_OUTPATIENT_CLINIC_OR_DEPARTMENT_OTHER): Payer: Medicare Other | Admitting: Radiology

## 2022-10-11 ENCOUNTER — Emergency Department (HOSPITAL_BASED_OUTPATIENT_CLINIC_OR_DEPARTMENT_OTHER): Payer: Medicare Other

## 2022-10-11 DIAGNOSIS — S0990XA Unspecified injury of head, initial encounter: Secondary | ICD-10-CM

## 2022-10-11 DIAGNOSIS — W19XXXA Unspecified fall, initial encounter: Secondary | ICD-10-CM

## 2022-10-11 DIAGNOSIS — W01198A Fall on same level from slipping, tripping and stumbling with subsequent striking against other object, initial encounter: Secondary | ICD-10-CM | POA: Diagnosis not present

## 2022-10-11 DIAGNOSIS — Y9302 Activity, running: Secondary | ICD-10-CM | POA: Diagnosis not present

## 2022-10-11 DIAGNOSIS — S62645A Nondisplaced fracture of proximal phalanx of left ring finger, initial encounter for closed fracture: Secondary | ICD-10-CM | POA: Insufficient documentation

## 2022-10-11 DIAGNOSIS — S40812A Abrasion of left upper arm, initial encounter: Secondary | ICD-10-CM | POA: Insufficient documentation

## 2022-10-11 DIAGNOSIS — T148XXA Other injury of unspecified body region, initial encounter: Secondary | ICD-10-CM

## 2022-10-11 DIAGNOSIS — Z950 Presence of cardiac pacemaker: Secondary | ICD-10-CM | POA: Insufficient documentation

## 2022-10-11 DIAGNOSIS — Z7984 Long term (current) use of oral hypoglycemic drugs: Secondary | ICD-10-CM | POA: Diagnosis not present

## 2022-10-11 DIAGNOSIS — S0003XA Contusion of scalp, initial encounter: Secondary | ICD-10-CM | POA: Diagnosis not present

## 2022-10-11 DIAGNOSIS — M25562 Pain in left knee: Secondary | ICD-10-CM

## 2022-10-11 DIAGNOSIS — Z79899 Other long term (current) drug therapy: Secondary | ICD-10-CM | POA: Diagnosis not present

## 2022-10-11 DIAGNOSIS — Z7982 Long term (current) use of aspirin: Secondary | ICD-10-CM | POA: Diagnosis not present

## 2022-10-11 DIAGNOSIS — S51012A Laceration without foreign body of left elbow, initial encounter: Secondary | ICD-10-CM | POA: Diagnosis not present

## 2022-10-11 DIAGNOSIS — I2510 Atherosclerotic heart disease of native coronary artery without angina pectoris: Secondary | ICD-10-CM | POA: Diagnosis not present

## 2022-10-11 DIAGNOSIS — S6992XA Unspecified injury of left wrist, hand and finger(s), initial encounter: Secondary | ICD-10-CM | POA: Diagnosis present

## 2022-10-11 DIAGNOSIS — S80212A Abrasion, left knee, initial encounter: Secondary | ICD-10-CM | POA: Insufficient documentation

## 2022-10-11 MED ORDER — CEFDINIR 300 MG PO CAPS: 300.0000 mg | ORAL_CAPSULE | Freq: Two times a day (BID) | ORAL | 0 refills | Status: DC

## 2022-10-11 MED ORDER — BACITRACIN ZINC 500 UNIT/GM EX OINT: 1.0000 | TOPICAL_OINTMENT | Freq: Two times a day (BID) | CUTANEOUS | 0 refills | Status: DC

## 2022-10-11 NOTE — ED Notes (Signed)
RT ambulated pt to bathroom and back. Pt respiratory status remained stable w/out any other issues. BLBS clr/dim SPO2 @97 % throughout walk.

## 2022-10-11 NOTE — ED Triage Notes (Signed)
Trip/ fall while running in 5k yesterday.  Hit head left knee and left arm/hand. When to UC today tp get checked out and referred here. Pain in left knee but able to ambulate. No loc no blood thinner

## 2022-10-11 NOTE — Discharge Instructions (Addendum)
You were seen in the ER for evaluation after your fall. There is a questionable fracture of your finger. I would like for you to follow up with a hand surgeon for this. I have included the information for one in the discharge paperwork. Please call to schedule an appointment. For your head, you can apply ice to the area to help. Please make sure you are cleaning your wounds daily with dial soap and water. Please make sure you are changing the bandages daily. It is ok to get these wet in the shower, but do not do any baths, Jacuzzis, pools, etc.  Please make sure you are following with your primary care doctor for reevaluation of these wounds.  Additionally, the start you on an antibiotic for you to take twice daily for next 7 days to prevent infection.  You can take Tylenol or Profen as needed for pain.  If you have any concerns, new or worsening symptoms, please return to your nearest emergency department for evaluation.  Contact a doctor if: These symptoms do not go away: Headaches. Dizziness. Double vision or vision changes. Trouble sleeping. Changes in mood. You have new symptoms. Get help right away if: You have sudden: Headache that is very bad. Vomiting that does not stop. Changes in the size of one of your pupils. Pupils are the black centers of your eyes. Changes in how you see (vision). More confusion or more grumpy moods. You have a seizure. Your symptoms get worse. You have a clear or bloody fluid coming from your nose or ears. These symptoms may be an emergency. Get help right away. Call 911. Do not wait to see if the symptoms will go away. Do not drive yourself to the hospital.

## 2022-10-11 NOTE — ED Provider Notes (Signed)
[place] EMERGENCY DEPARTMENT AT [facility] Provider Note   CSN: [id number] Arrival date & time: 10/11/22  1142     History Chief Complaint  Patient presents with   Travis [name] is a 82 y.o. male with pacemaker and CAD presents to the ER for evaluation after a mechanical fall yesterday. The patient reports that he was running and race when his toe caught the uneven sidewalk and he tripped landing on his left side. He reports that he did hit his head, but there was no LOC. Denies any current headache or visual changes. Denies any blood thinner use. He reports that his wounds were cleansed yesterday when it happened at the medical tent. He reports he is still ambulatory without pain. The patient reports that he wanted to come here to be "checked out" just in case. His pain is mainly in his left hand. He denies any neck pain, chest pain, belly pain, or back pain. Reports abrasions to his left knee and left arm. He denies any SOB, nausea, or vomiting. Denies any weakness. NKDA. He reports that he thinks his tetanus is up to date.    Fall Pertinent negatives include no chest pain, no abdominal pain, no headaches and no shortness of breath.       Home Medications Prior to Admission medications   Medication Sig Start Date End Date Taking? Authorizing Provider  amLODipine (NORVASC) 10 MG tablet SMARTSIG:1 By Mouth Patient not taking: Reported on 01/15/2022 03/20/21   [provider]  amLODipine (NORVASC) 5 MG tablet TAKE 1 and 1/2 TABLETS ONCE DAILY. 02/23/22   Lennette Bihari, MD  aspirin EC 81 MG tablet Take 81 mg by mouth daily.    [provider]  atorvastatin (LIPITOR) 40 MG tablet TAKE ONE TABLET BY MOUTH DAILY. 08/20/22   Lennette Bihari, MD  Blood Glucose Monitoring Suppl (ONETOUCH VERIO FLEX SYSTEM) w/Device KIT Use to test blood sugars 2-3x a week 04/15/18   [provider]  Cholecalciferol (VITAMIN D3) 2000 units capsule Take 2,000 Units by  mouth daily.    [provider]  COMBIGAN 0.2-0.5 % ophthalmic solution Place 1 drop into both eyes 2 (two) times daily.  03/13/16   [provider]  ezetimibe (ZETIA) 10 MG tablet TAKE ONE TABLET BY MOUTH DAILY. 05/19/21   Lennette Bihari, MD  glucose blood (ONETOUCH VERIO) test strip Use to test blood sugars 2-3x a week E11.9 04/15/18   [provider]  hydrochlorothiazide (MICROZIDE) 12.5 MG capsule TAKE ONE CAPSULE EVERY DAY. 01/26/22   Lennette Bihari, MD  lisinopril (ZESTRIL) 20 MG tablet TAKE ONE TABLET BY MOUTH ONCE DAILY 07/31/22   Lennette Bihari, MD  metFORMIN (GLUCOPHAGE) 500 MG tablet Take 500 mg by mouth 2 (two) times daily with a meal.     [provider]  mupirocin ointment (BACTROBAN) 2 % SMARTSIG:sparingly Topical Twice Daily Patient not taking: Reported on 01/19/2022 03/20/21   [provider]      Allergies    Patient has no known allergies.    Review of Systems   Review of Systems  Constitutional:  Negative for chills and fever.  Eyes:  Negative for photophobia and visual disturbance.  Respiratory:  Negative for cough and shortness of breath.   Cardiovascular:  Negative for chest pain, palpitations and leg swelling.  Gastrointestinal:  Negative for abdominal pain, nausea and vomiting.  Musculoskeletal:  Positive for arthralgias and myalgias. Negative for back pain,  gait problem, neck pain and neck stiffness.  Skin:  Positive for wound.  Neurological:  Negative for dizziness, syncope, speech difficulty, weakness, light-headedness and headaches.    Physical Exam Updated Vital Signs BP (!) 147/80 (BP Location: Right Arm)   Pulse 66   Temp 98.4 F (36.9 C) (Oral)   Resp 18   SpO2 100%  Physical Exam Vitals and nursing note reviewed.  Constitutional:      General: He is not in acute distress.    Appearance: He is not ill-appearing or toxic-appearing.  HENT:     Head: Normocephalic.      Comments: Small bruise noted to the  area. No step off or deformity noted. Mild tenderness. No battle signs or raccoon eyes. No pain with opening or closing the jaw.     Nose: Nose normal.     Comments: No septal hematoma.     Mouth/Throat:     Mouth: Mucous membranes are moist.  Eyes:     General: No scleral icterus.    Extraocular Movements: Extraocular movements intact.     Pupils: Pupils are equal, round, and reactive to light.  Neck:     Comments: No signs of trauma. No overlying skin changes. Nontender to palpation. No step off or deformity. ROM without pain.  Cardiovascular:     Rate and Rhythm: Normal rate.  Pulmonary:     Effort: Pulmonary effort is normal. No respiratory distress.     Breath sounds: Normal breath sounds.     Comments: No signs of trauma. No overlying skin changes. Nontender to palpation.  Chest:     Chest wall: No tenderness.  Abdominal:     General: Bowel sounds are normal.     Palpations: Abdomen is soft.     Tenderness: There is no abdominal tenderness. There is no guarding or rebound.     Comments: No signs of trauma. No overlying skin changes. Nontender to palpation.   Musculoskeletal:        General: Signs of injury present.     Cervical back: Normal range of motion. No tenderness.     Right lower leg: No edema.     Left lower leg: No edema.     Comments: UE- Abrasion noted to the upper left arm near shoulder. Superficial skin tear noted at the more distal humerus, elbow, and proximal forearm with surrounding ecchymosis. Some tenderness to palpation. However the patient is able to move his elbow and shoulder without pain. For the hands, there is no snuffbox tenderness bilaterally. For the left hand, there is bruising noted to the dorsum with skin tears over the MCP. 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 The patient reports it has been like that for over a decade. Cap refill brisk on all fingers. Radial pulses palpated and symmetric.  Compartments are soft throughout. RUE without signs of trauma. Non tender to palpation throughout.   BACK - No signs of trauma. No overlying skin changes. No midline or paraspinal tenderness to palpation. No step offs or deformities.  LLE - superficial abrasion seen to the left knee. Some pain with flexion and extension. He is ambulatory with some pain with the knee. Palpable DP and PT pulses. Sensation reportedly intact and symmetric per patient. Cap refill 2-3 seconds. Compartments are soft.   Skin:    General: Skin is warm and dry.  Neurological:     General: No focal deficit present.  Mental Status: He is alert. Mental status is at baseline.     Cranial Nerves: No cranial nerve deficit.     Sensory: No sensory deficit.     Motor: No weakness.     Gait: Gait normal.          ED Results / Procedures / Treatments   Labs (all labs ordered are listed, but only abnormal results are displayed) Labs Reviewed - No data to display  EKG None  Radiology DG Elbow Complete Left  Result Date: 10/11/2022 CLINICAL DATA:  Larey Seat while running.  Pain. EXAM: LEFT ELBOW - COMPLETE 3+ VIEW COMPARISON:  None Available. FINDINGS: There is no evidence of fracture, dislocation, or joint effusion. There is no evidence of arthropathy or other focal bone abnormality. Soft tissues are unremarkable. IMPRESSION: Negative. Electronically Signed   By: Paulina Fusi M.D.   On: 10/11/2022 14:44   DG Hand Complete Left  Result Date: 10/11/2022 CLINICAL DATA:  Larey Seat while running.  Pain. EXAM: LEFT HAND - COMPLETE 3+ VIEW COMPARISON:  None Available. FINDINGS: There is no evidence of fracture or dislocation. There is no evidence of arthropathy or other focal bone abnormality. Soft tissues are unremarkable. IMPRESSION: Negative. Electronically Signed   By: Paulina Fusi M.D.   On: 10/11/2022 14:44   DG Wrist Complete Left  Result Date: 10/11/2022 CLINICAL DATA:  Larey Seat while running EXAM: LEFT WRIST - COMPLETE 3+  VIEW COMPARISON:  None Available. FINDINGS: There is no evidence of fracture or dislocation. There is no evidence of arthropathy or other focal bone abnormality. Soft tissues are unremarkable. IMPRESSION: Negative. Electronically Signed   By: Paulina Fusi M.D.   On: 10/11/2022 14:43   DG Knee Complete 4 Views Left  Result Date: 10/11/2022 CLINICAL DATA:  Larey Seat while running EXAM: LEFT KNEE - COMPLETE 4+ VIEW COMPARISON:  12/16/2017 FINDINGS: Small knee joint effusion. Patellofemoral osteoarthritis with marginal osteophyte formation. Mild narrowing of the lateral weight-bearing compartment. Regional arterial calcification. No acute traumatic bone finding. IMPRESSION: No acute or traumatic finding. Small joint effusion. 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 Electronically Signed   By: Paulina Fusi M.D.   On: 10/11/2022 14:42   CT Head Wo Contrast  Result Date: 10/11/2022 CLINICAL DATA:  Trip and fall running in a 5 K yesterday EXAM: CT HEAD WITHOUT CONTRAST CT CERVICAL SPINE WITHOUT CONTRAST TECHNIQUE: Multidetector CT imaging of the head and cervical spine was performed following the standard protocol without intravenous contrast. Multiplanar CT image reconstructions of the cervical spine were also generated. RADIATION DOSE REDUCTION: This exam was performed according to the departmental dose-optimization program which includes automated exposure control, adjustment of the mA and/or kV according to patient size and/or use of iterative reconstruction technique. COMPARISON:  11/28/2020 FINDINGS: CT HEAD FINDINGS Brain: No evidence of acute infarction, hemorrhage, hydrocephalus, extra-axial collection or mass lesion/mass effect. Periventricular and deep white matter hypodensity. Vascular: No hyperdense vessel or unexpected calcification. Skull: Normal. Negative for fracture or focal lesion. Sinuses/Orbits: No acute finding. Other: None. CT CERVICAL SPINE FINDINGS Alignment: Normal.  Skull base and vertebrae: No acute fracture. No primary bone lesion or focal pathologic process. Soft tissues and spinal canal: No prevertebral fluid or swelling. No visible canal hematoma. Disc levels: Moderate disc space height loss and osteophytosis of C5-C7 with otherwise preserved disc spaces Upper chest: Negative. Other: None. IMPRESSION: 1. No acute intracranial pathology. Small-vessel white matter disease. 2. No fracture or static subluxation of the cervical spine. 3. Moderate disc space height loss and  osteophytosis of C5-C7 with otherwise preserved disc spaces. Electronically Signed   By: Jearld Lesch M.D.   On: 10/11/2022 13:48   CT Cervical Spine Wo Contrast  Result Date: 10/11/2022 CLINICAL DATA:  Trip and fall running in a 5 K yesterday EXAM: CT HEAD WITHOUT CONTRAST CT CERVICAL SPINE WITHOUT CONTRAST TECHNIQUE: Multidetector CT imaging of the head and cervical spine was performed following the standard protocol without intravenous contrast. Multiplanar CT image reconstructions of the cervical spine were also generated. RADIATION DOSE REDUCTION: This exam was performed according to the departmental dose-optimization program which includes automated exposure control, adjustment of the mA and/or kV according to patient size and/or use of iterative reconstruction technique. COMPARISON:  11/28/2020 FINDINGS: CT HEAD FINDINGS Brain: No evidence of acute infarction, hemorrhage, hydrocephalus, extra-axial collection or mass lesion/mass effect. Periventricular and deep white matter hypodensity. Vascular: No hyperdense vessel or unexpected calcification. Skull: Normal. Negative for fracture or focal lesion. Sinuses/Orbits: No acute finding. Other: None. CT CERVICAL SPINE FINDINGS Alignment: Normal. Skull base and vertebrae: No acute fracture. No primary bone lesion or focal pathologic process. Soft tissues and spinal canal: No prevertebral fluid or swelling. No visible canal hematoma. Disc levels: Moderate  disc space height loss and osteophytosis of C5-C7 with otherwise preserved disc spaces Upper chest: Negative. Other: None. IMPRESSION: 1. No acute intracranial pathology. Small-vessel white matter disease. 2. No fracture or static subluxation of the cervical spine. 3. Moderate disc space height loss and osteophytosis of C5-C7 with otherwise preserved disc spaces. Electronically Signed   By: Jearld Lesch M.D.   On: 10/11/2022 13:48    Procedures Procedures   Medications Ordered in ED Medications - No data to display  ED Course/ Medical Decision Making/ A&P   Medical Decision Making Amount and/or Complexity of Data Reviewed Radiology: ordered.  Risk OTC drugs. Prescription drug management.    82 y.o. male presents to the ER for evaluation of after fall. Differential diagnosis includes but is not limited to trauma. Vital signs mildly elevated BP otherwise unremarkable . Physical exam as noted above.   Given that this was a mechanical fall, I do not think the patient needs any lab work. Will proceed with CT and XR.   Last tetanus was 2022. Patient is up to date with tetanus.   CT imaging shows  1. No acute intracranial pathology. Small-vessel white matter disease. 2. No fracture or static subluxation of the cervical spine. 3. Moderate disc space height loss and osteophytosis of C5-C7 with otherwise preserved disc spaces.   X-ray of the left elbow, wrist, hand unremarkable per radiologist read.  X-ray of the left knee shows No acute or traumatic finding. Small joint effusion. 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 Per radiologist read.   On my review of the XR imaging of the hand, I question a possible fracture seen the the proximal phalanx of the ring finger on the left. I consulted hand surgery and spoke with Dr. Drucie Ip. He reviewed the imaging and reports that it might be a fracture. He is ok with a static splint and follow up with orthopedics.  Recommends antibiotics as well.   The wounds were cleansed with Shurclens and irrigated with normal saline personally by me.  I applied Xeroform to the areas as well as Telfa.  I did apply some Kerlix to the knee as well as an Ace bandage for further support which she reports he find significant improvement with his pain from.  I applied Kerlix to the elbow and  hand.  I did apply static finger splint to the ring finger on the left as well.  Patient reports improvement upon his symptoms and pain with cleansing and redressing the wounds.  I discussed with him that I like for him to follow-up with hand surgery as well as an orthopedic provider if he has any continuing pain.  We discussed the need and importance of follow-up with hand surgery and take any antibiotics as prescribed over the infection.  We discussed the importance of good wound care and keeping wounds clean to prevent infection as well.  He reports he does have 2 daughters live nearby that can come and help him with his dressing changes.  Overall, I do feel the patient stable for discharge.  He has been ambulatory without any assistance.  His imaging does not show any acute focal change.  He does not have any focal neurodeficit.  He is answering questions appropriately appropriate speech without any visual changes.  He reports that he is feeling much better and he actually feels that his pain is improved since being here.  I do feel like he is stable for discharge home with close PCP and outpatient orthopedic/hand follow-up.  We discussed the results of the labs/imaging. The plan is take antibiotics as prescribed, wound cleansing, follow-up with orthopedics/hand. We discussed strict return precautions and red flag symptoms. The patient verbalized their understanding and agrees to the plan. The patient is stable and being discharged home in good condition.  Portions of this report may have been transcribed using voice recognition software. Every  effort was made to ensure accuracy; however, inadvertent computerized transcription errors may be present.    Final Clinical Impression(s) / ED Diagnoses Final diagnoses:  Fall, initial encounter  Skin abrasion  Acute pain of left knee  Closed nondisplaced fracture of proximal phalanx of left ring finger, initial encounter  Injury of head, initial encounter    Rx / DC Orders ED Discharge Orders          Ordered    cefdinir (OMNICEF) 300 MG capsule  2 times daily        10/11/22 1934    bacitracin ointment  2 times daily        10/11/22 1934              Achille Rich, Cordelia Poche 10/13/22 0120    Margarita Grizzle, MD 10/22/22 1229

## 2022-10-19 ENCOUNTER — Ambulatory Visit (INDEPENDENT_AMBULATORY_CARE_PROVIDER_SITE_OTHER): Payer: Medicare Other

## 2022-10-19 DIAGNOSIS — I495 Sick sinus syndrome: Secondary | ICD-10-CM

## 2022-10-20 LAB — CUP PACEART REMOTE DEVICE CHECK
Battery Remaining Longevity: 98 mo
Battery Voltage: 2.99 V
Brady Statistic AP VP Percent: 0.45 %
Brady Statistic AP VS Percent: 69.17 %
Brady Statistic AS VP Percent: 0.04 %
Brady Statistic AS VS Percent: 30.33 %
Brady Statistic RA Percent Paced: 69.71 %
Brady Statistic RV Percent Paced: 0.5 %
Date Time Interrogation Session: 20241006222605
Implantable Lead Connection Status: 753985
Implantable Lead Connection Status: 753985
Implantable Lead Implant Date: 20180530
Implantable Lead Implant Date: 20180530
Implantable Lead Location: 753859
Implantable Lead Location: 753860
Implantable Lead Model: 5076
Implantable Lead Model: 5076
Implantable Pulse Generator Implant Date: 20180530
Lead Channel Impedance Value: 304 Ohm
Lead Channel Impedance Value: 342 Ohm
Lead Channel Impedance Value: 380 Ohm
Lead Channel Impedance Value: 418 Ohm
Lead Channel Pacing Threshold Amplitude: 0.75 V
Lead Channel Pacing Threshold Amplitude: 0.75 V
Lead Channel Pacing Threshold Pulse Width: 0.4 ms
Lead Channel Pacing Threshold Pulse Width: 0.4 ms
Lead Channel Sensing Intrinsic Amplitude: 13.75 mV
Lead Channel Sensing Intrinsic Amplitude: 13.75 mV
Lead Channel Sensing Intrinsic Amplitude: 3.875 mV
Lead Channel Sensing Intrinsic Amplitude: 3.875 mV
Lead Channel Setting Pacing Amplitude: 1.5 V
Lead Channel Setting Pacing Amplitude: 2 V
Lead Channel Setting Pacing Pulse Width: 0.4 ms
Lead Channel Setting Sensing Sensitivity: 2 mV
Zone Setting Status: 755011

## 2022-11-04 NOTE — Progress Notes (Signed)
Remote pacemaker transmission.   

## 2022-11-05 ENCOUNTER — Other Ambulatory Visit: Payer: Self-pay | Admitting: Cardiovascular Disease

## 2022-12-08 ENCOUNTER — Ambulatory Visit (INDEPENDENT_AMBULATORY_CARE_PROVIDER_SITE_OTHER): Payer: Medicare Other | Admitting: Podiatry

## 2022-12-08 ENCOUNTER — Encounter: Payer: Self-pay | Admitting: Podiatry

## 2022-12-08 DIAGNOSIS — E119 Type 2 diabetes mellitus without complications: Secondary | ICD-10-CM | POA: Diagnosis not present

## 2022-12-08 DIAGNOSIS — M79675 Pain in left toe(s): Secondary | ICD-10-CM | POA: Diagnosis not present

## 2022-12-08 DIAGNOSIS — B351 Tinea unguium: Secondary | ICD-10-CM | POA: Diagnosis not present

## 2022-12-08 DIAGNOSIS — M79674 Pain in right toe(s): Secondary | ICD-10-CM | POA: Diagnosis not present

## 2022-12-08 DIAGNOSIS — Q828 Other specified congenital malformations of skin: Secondary | ICD-10-CM | POA: Diagnosis not present

## 2022-12-09 ENCOUNTER — Ambulatory Visit: Payer: Medicare Other | Admitting: Podiatry

## 2022-12-12 NOTE — Progress Notes (Signed)
  Subjective:  Patient ID: Travis [name], male    DOB: 1940-03-13,  MRN: [id number]  TRUSTYN EWERT presents to clinic today for preventative diabetic foot care and painful porokeratotic lesions of both feet. Pain prevent(s) comfortable ambulation. Aggravating factor is weightbearing with and without shoegear.  Patient states he suffered a fall at his last race after encountering uneven sidewalk. He has recovered well. Chief Complaint  Patient presents with   Routine Post Op    Patient states " his feet have been fine since his last visit but he can feel the callouses and its time for them to come off" patient seen his PCP last week    New problem(s): None.   PCP is Tisovec, Adelfa Koh, MD.  No Known Allergies  Review of Systems: Negative except as noted in the HPI.  Objective:  There were no vitals filed for this visit. Travis [name] is a pleasant 82 y.o. male WD, WN in NAD. AAO x 3.  Vascular Examination: CFT <3 seconds b/l LE. Palpable DP pulse(s) b/l LE. Palpable PT pulse(s) b/l LE. Pedal hair sparse. No pain with calf compression b/l. Lower extremity skin temperature gradient within normal limits. No edema noted b/l LE. No ischemia or gangrene noted b/l LE. No cyanosis or clubbing noted b/l LE.  Dermatological Examination: Pedal skin is warm and supple b/l LE. No open wounds b/l LE. No interdigital macerations noted b/l LE. Toenails 1-5 b/l elongated, discolored, dystrophic, thickened, crumbly with subungual debris and tenderness to dorsal palpation.   Porokeratotic lesion(s) right heel and submet head 3 left foot. No erythema, no edema, no drainage, no fluctuance.  Neurological Examination: Protective sensation intact 5/5 intact bilaterally with 10g monofilament b/l. Vibratory sensation intact b/l. Proprioception intact bilaterally.  Musculoskeletal Examination: Normal muscle strength 5/5 to all lower extremity muscle groups bilaterally. Hallux valgus with bunion deformity noted  b/l lower extremities.. No pain, crepitus or joint limitation noted with ROM b/l LE.  Patient ambulates independently without assistive aids.  Assessment/Plan: 1. Pain due to onychomycosis of toenails of both feet   2. Porokeratosis   3. Diabetes mellitus without complication (HCC)    -Consent given for treatment as described below: -Examined patient. -Continue foot and shoe inspections daily. Monitor blood glucose per PCP/Endocrinologist's recommendations. -Patient to continue soft, supportive shoe gear daily. -Toenails 1-5 b/l were debrided in length and girth with sterile nail nippers and dremel without iatrogenic bleeding.  -Porokeratotic lesion(s) right heel and submet head 3 left foot pared and enucleated with sterile currette without incident. Total number of lesions debrided=2. -Patient/POA to call should there be question/concern in the interim.   Return in about 3 months (around 03/10/2023).  Freddie Breech, DPM      [place] LOCATION: [address], [place] [address]                   Office [phone]   [facility] LOCATION: [address] [place], [place] [address] Office [phone]

## 2023-01-11 ENCOUNTER — Other Ambulatory Visit (HOSPITAL_BASED_OUTPATIENT_CLINIC_OR_DEPARTMENT_OTHER): Payer: Self-pay

## 2023-01-11 MED ORDER — COMIRNATY 30 MCG/0.3ML IM SUSY
0.3000 mL | PREFILLED_SYRINGE | Freq: Once | INTRAMUSCULAR | 0 refills | Status: AC
  Filled 2023-01-11: qty 0.3, 1d supply, fill #0

## 2023-01-19 ENCOUNTER — Ambulatory Visit: Payer: Medicare Other

## 2023-01-19 DIAGNOSIS — I495 Sick sinus syndrome: Secondary | ICD-10-CM

## 2023-01-20 LAB — CUP PACEART REMOTE DEVICE CHECK
Battery Remaining Longevity: 96 mo
Battery Voltage: 2.99 V
Brady Statistic AP VP Percent: 0.17 %
Brady Statistic AP VS Percent: 58.92 %
Brady Statistic AS VP Percent: 0.04 %
Brady Statistic AS VS Percent: 40.87 %
Brady Statistic RA Percent Paced: 59.16 %
Brady Statistic RV Percent Paced: 0.22 %
Date Time Interrogation Session: 20250107101349
Implantable Lead Connection Status: 753985
Implantable Lead Connection Status: 753985
Implantable Lead Implant Date: 20180530
Implantable Lead Implant Date: 20180530
Implantable Lead Location: 753859
Implantable Lead Location: 753860
Implantable Lead Model: 5076
Implantable Lead Model: 5076
Implantable Pulse Generator Implant Date: 20180530
Lead Channel Impedance Value: 266 Ohm
Lead Channel Impedance Value: 323 Ohm
Lead Channel Impedance Value: 380 Ohm
Lead Channel Impedance Value: 418 Ohm
Lead Channel Pacing Threshold Amplitude: 0.625 V
Lead Channel Pacing Threshold Amplitude: 0.75 V
Lead Channel Pacing Threshold Pulse Width: 0.4 ms
Lead Channel Pacing Threshold Pulse Width: 0.4 ms
Lead Channel Sensing Intrinsic Amplitude: 12.625 mV
Lead Channel Sensing Intrinsic Amplitude: 12.625 mV
Lead Channel Sensing Intrinsic Amplitude: 3.875 mV
Lead Channel Sensing Intrinsic Amplitude: 3.875 mV
Lead Channel Setting Pacing Amplitude: 1.5 V
Lead Channel Setting Pacing Amplitude: 2 V
Lead Channel Setting Pacing Pulse Width: 0.4 ms
Lead Channel Setting Sensing Sensitivity: 2 mV
Zone Setting Status: 755011

## 2023-02-04 ENCOUNTER — Other Ambulatory Visit: Payer: Self-pay | Admitting: Cardiovascular Disease

## 2023-02-04 ENCOUNTER — Encounter: Payer: Self-pay | Admitting: Cardiovascular Disease

## 2023-02-04 ENCOUNTER — Ambulatory Visit: Payer: Medicare Other | Attending: Cardiovascular Disease | Admitting: Cardiovascular Disease

## 2023-02-04 VITALS — BP 144/62 | HR 59 | Ht 71.0 in | Wt 158.4 lb

## 2023-02-04 DIAGNOSIS — Z950 Presence of cardiac pacemaker: Secondary | ICD-10-CM | POA: Insufficient documentation

## 2023-02-04 DIAGNOSIS — E119 Type 2 diabetes mellitus without complications: Secondary | ICD-10-CM | POA: Insufficient documentation

## 2023-02-04 DIAGNOSIS — I2510 Atherosclerotic heart disease of native coronary artery without angina pectoris: Secondary | ICD-10-CM | POA: Diagnosis present

## 2023-02-04 DIAGNOSIS — I358 Other nonrheumatic aortic valve disorders: Secondary | ICD-10-CM | POA: Diagnosis present

## 2023-02-04 DIAGNOSIS — E7800 Pure hypercholesterolemia, unspecified: Secondary | ICD-10-CM | POA: Insufficient documentation

## 2023-02-04 DIAGNOSIS — I10 Essential (primary) hypertension: Secondary | ICD-10-CM | POA: Diagnosis present

## 2023-02-04 DIAGNOSIS — I480 Paroxysmal atrial fibrillation: Secondary | ICD-10-CM | POA: Diagnosis not present

## 2023-02-04 DIAGNOSIS — I495 Sick sinus syndrome: Secondary | ICD-10-CM | POA: Diagnosis present

## 2023-02-04 DIAGNOSIS — I4729 Other ventricular tachycardia: Secondary | ICD-10-CM | POA: Diagnosis present

## 2023-02-04 DIAGNOSIS — I484 Atypical atrial flutter: Secondary | ICD-10-CM | POA: Diagnosis present

## 2023-02-04 NOTE — Progress Notes (Signed)
[id number]  Cardiology Office Note:    Date:  02/05/2023   ID:  Travis [name], DOB 19-Nov-1940, MRN [id number]  PCP:  Gaspar Garbe, MD  Cardiologist: Nicki Guadalajara M.D.; Thurmon Fair, MD    Referring MD: Wylene Simmer Adelfa Koh, MD  No chief complaint on file.   History of Present Illness:    Travis [name] is a 83 y.o. male with a hx of hypertension and hyperlipidemia, moderate CAD, mild aortic valve stenosis, severe sinus bradycardia, periods of junctional escape rhythm and sinus pauses of up to 6 seconds in duration on an event monitor, for which she received a dual-chamber pacemaker in 2018 (Medtronic MRI conditional).  He underwent elective implantation of a dual-chamber Medtronic Azure XT permanent pacemaker on 06/10/2016. He had coronary angiography the same day which showed 65% mid LAD, 70% second diagonal, 30-40%stenoses in the mid circumflex.  He has mild aortic valve stenosis by an echo performed in May 2020.  He is doing well.  He continues to live independently and still jogs on a regular basis.  He has not had any recent falls but is very cautious since he is afraid of falling.  He has not had any bleeding problems.  He denies palpitations, dizziness, syncope, shortness of breath at rest or with activity or chest pain at rest or with activity.  He denies any focal neurological events that would suggest stroke or TIA.  For the first time since pacemaker implantation about 7 years ago, interrogation of his pacemaker shows a significant episode of atrial arrhythmia.  He had a roughly 54-minute episode of what appears to be atrial flutter with variable AV block and a ventricular rate in the 60s.  It occurred in mid December in the middle of the night when he was asleep.  He was completely unaware of it.  Otherwise, pacemaker interrogation shows normal findings.  Estimated generator longevity is about 8 years and all lead parameters are normal.  He has roughly 70% atrial  pacing and virtually never requires ventricular pacing.  Underlying rhythm is severe sinus bradycardia in the 35-40 bpm range.  He continues to have very infrequent episodes of nonsustained VT lasting for a handful of beats less than once a month.  His blood pressure is just slightly high today, but is typically much lower.  No changes made to his medications today.  Metabolic control is great with a recent LDL cholesterol 51 and a hemoglobin A1c of 6.6% (he is on atorvastatin, ezetimibe and metformin).   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   SSS (sick sinus syndrome) (HCC) 06/10/2016   Type II diabetes mellitus (HCC)     Past Surgical History:  Procedure Laterality Date   APPENDECTOMY     CARDIAC CATHETERIZATION  06/10/2016   CATARACT EXTRACTION W/ INTRAOCULAR LENS IMPLANT Right    CYSTOSCOPY W/ STONE MANIPULATION     INSERT / REPLACE / REMOVE PACEMAKER  06/10/2016   dual chamber permanent pacemaker   KNEE ARTHROSCOPY W/ MENISCECTOMY Bilateral    LAPAROSCOPIC CHOLECYSTECTOMY     LEFT HEART CATH AND CORONARY ANGIOGRAPHY N/A 06/10/2016   Procedure: Left Heart Cath and Coronary Angiography;  Surgeon: Dolores Patty,  MD;  Location: [facility];  Service: Cardiovascular;  Laterality: N/A;   LITHOTRIPSY     PACEMAKER IMPLANT N/A 06/10/2016   Procedure: Pacemaker Implant;  Surgeon: Thurmon Fair, MD;  Location: [facility];  Service: Cardiovascular;  Laterality: N/A;   TONSILLECTOMY      Current Medications: Current Meds  Medication Sig   amLODipine (NORVASC) 10 MG tablet    aspirin EC 81 MG tablet Take 81 mg by mouth daily.   atorvastatin (LIPITOR) 40 MG tablet TAKE ONE TABLET BY MOUTH DAILY.   Blood Glucose  Monitoring Suppl (ONETOUCH VERIO FLEX SYSTEM) w/Device KIT Use to test blood sugars 2-3x a week   Cholecalciferol (VITAMIN D3) 2000 units capsule Take 2,000 Units by mouth daily.   Cholecalciferol 125 MCG (5000 UT) capsule Take 5,000 Units by mouth daily.   COMBIGAN 0.2-0.5 % ophthalmic solution Place 1 drop into both eyes 2 (two) times daily.    ezetimibe (ZETIA) 10 MG tablet TAKE ONE TABLET BY MOUTH DAILY.   glucose blood (ONETOUCH VERIO) test strip Use to test blood sugars 2-3x a week E11.9   lisinopril (ZESTRIL) 20 MG tablet TAKE ONE TABLET BY MOUTH ONCE DAILY   metFORMIN (GLUCOPHAGE) 500 MG tablet Take 500 mg by mouth 2 (two) times daily with a meal.    [DISCONTINUED] hydrochlorothiazide (MICROZIDE) 12.5 MG capsule TAKE ONE CAPSULE EVERY DAY.     Allergies:   Patient has no known allergies.   Social History   Socioeconomic History   Marital status: Widowed    Spouse name: Not on file   Number of children: Not on file   Years of education: Not on file   Highest education level: Not on file  Occupational History   Not on file  Tobacco Use   Smoking status: Former    Current packs/day: 0.00    Average packs/day: 0.5 packs/day for 25.0 years (12.5 ttl pk-yrs)    Types: Cigarettes    Start date: 36    Quit date: 70    Years since quitting: 42.0   Smokeless tobacco: Never  Vaping Use   Vaping status: Never Used  Substance and Sexual Activity   Alcohol use: Yes    Comment: 06/10/2016 "might have 2 beers/month"   Drug use: No   Sexual activity: Not on file  Other Topics Concern   Not on file  Social History Narrative   Not on file   Social Drivers of Health   Financial Resource Strain: Not on file  Food Insecurity: Low Risk  (10/11/2022)   Received from [facility]   Hunger Vital Sign    Worried About Running Out of Food in the Last Year: Never true    Ran Out of Food in the Last Year: Never true  Transportation Needs: No Transportation Needs (10/11/2022)    Received from [facility]    In the past 12 months, has lack of reliable transportation kept you from medical appointments, meetings, work or from getting things needed for daily living? : No  Physical Activity: Not on file  Stress: Not on file  Social Connections: Not on file     Family History: The patient's family history includes Colon cancer in his father. ROS:   Please see the history of present illness.    All other systems are reviewed and are negative.   EKGs/Labs/Other Studies Reviewed:    The following studies were reviewed today: Full pacemaker check performed today 01/15/2022  12/26/2021 echocardiogram   1. Left ventricular ejection fraction, by estimation, is 60 to 65%. The  left ventricle has normal function. The left ventricle has no regional  wall motion abnormalities. Left ventricular diastolic parameters are  indeterminate.   2. Right ventricular systolic function is normal. The right ventricular  size is normal. There is normal pulmonary artery systolic pressure. The  estimated right ventricular systolic pressure is 28.6 mmHg.   3. The mitral valve is normal in structure. Mild mitral valve  regurgitation. No evidence of mitral stenosis.   4. Tricuspid valve regurgitation is mild to moderate.   5. The aortic valve is normal in structure. Aortic valve regurgitation is  mild. Aortic valve sclerosis/calcification is present, without any  evidence of aortic stenosis.   6. The inferior vena cava is normal in size with greater than 50%  respiratory variability, suggesting right atrial pressure of 3 mmHg.   EKG:    EKG Interpretation Date/Time:  Thursday February 04 2023 10:49:59 EST Ventricular Rate:  59 PR Interval:  224 QRS Duration:  106 QT Interval:  392 QTC Calculation: 388 R Axis:   13  Text Interpretation: Sinus bradycardia with 1st degree A-V block and occasional atrial pacing Nonspecific ST and T wave abnormality When compared  with ECG of 11-Jun-2016 06:36, Sinus rhythm has replaced Electronic atrial pacemaker ST now depressed in Lateral leads Nonspecific T wave abnormality, worse in Lateral leads Confirmed by Dontavia Brand ([id number]) on 02/05/2023 8:31:11 AM         Recent Labs: No results found for requested labs within last 365 days.  10/09/2019 Hemoglobin 14.1, creatinine 1.3, TSH 3.1, hemoglobin A1c 6.6% Total cholesterol 110, HDL 52, LDL 50, triglycerides 82, APO B 59  10/23/2020 Cholesterol 103, HDL 46, LDL 46, triglycerides 53 potassium 4.5, normal liver function tests, hemoglobin A1c 6.2%  11/21/2021 Cholesterol 114, HDL 54, LDL 47, triglycerides 63 Hemoglobin A1c 6.3% Potassium 4.8, ALT 23  Physical Exam:    VS:  BP (!) 144/62   Pulse (!) 59   Ht 5\' 11"  (1.803 m)   Wt 158 lb 6.4 oz (71.8 kg)   SpO2 93%   BMI 22.09 kg/m     Wt Readings from Last 3 Encounters:  02/04/23 158 lb 6.4 oz (71.8 kg)  01/19/22 155 lb 3.2 oz (70.4 kg)  01/15/22 156 lb 3.2 oz (70.9 kg)     General: Alert, oriented x3, no distress, lean and fit, looks younger than stated age.  Healthy left subclavian pacemaker site. Head: no evidence of trauma, PERRL, EOMI, no exophtalmos or lid lag, no myxedema, no xanthelasma; normal ears, nose and oropharynx Neck: normal jugular venous pulsations and no hepatojugular reflux; brisk carotid pulses without delay and no carotid bruits Chest: clear to auscultation, no signs of consolidation by percussion or palpation, normal fremitus, symmetrical and full respiratory excursions Cardiovascular: normal position and quality of the apical impulse, regular rhythm, normal first and second heart sounds, 1-2/6 aortic ejection murmur, no diastolic murmurs, rubs or gallops Abdomen: no tenderness or distention, no masses by palpation, no abnormal pulsatility or arterial bruits, normal bowel sounds, no hepatosplenomegaly Extremities: no clubbing, cyanosis or edema; 2+ radial, ulnar and brachial  pulses bilaterally; 2+ right femoral, posterior tibial and dorsalis pedis pulses; 2+ left femoral, posterior tibial and dorsalis pedis pulses; no subclavian or femoral bruits Neurological: grossly nonfocal Psych: Normal mood and affect     ASSESSMENT:    1. Atypical atrial flutter (HCC)   2. SSS (sick sinus  syndrome) (HCC)   3. Pacemaker   4. Coronary artery disease involving native coronary artery of native heart without angina pectoris   5. Aortic valve sclerosis   6. Essential hypertension   7. NSVT (nonsustained ventricular tachycardia) (HCC)   8. Hypercholesterolemia   9. Type 2 diabetes mellitus without complication, without long-term current use of insulin (HCC)        PLAN:    In order of problems listed above:  Atrial flutter : He has had brief atrial tachycardia in the past but no sustained arrhythmia.  There is a mention of atrial fibrillation in his chart, but this is not properly documented.  During almost 7 years of pacemaker monitoring this is the first time we have seen any sustained arrhythmia.  We discussed the fact that whether or not brief arrhythmia incidentally discovered on a pacemaker download presented indication for anticoagulation.  This is currently actively debated in the literature.  I think this episode is too brief to justify starting anticoagulants and he agrees.  Will continue monitoring carefully via his pacemaker. SSS: Has no symptoms of bradycardia whatsoever.  Current sensor settings appear to be appropriate. Pacemaker: Normal device function.  Continue remote downloads every 3 months. CAD: He does not have chest pain despite the fact that he walks and runs regularly.  He only had moderate nonobstructive disease at angiography in 2018.  The focus is on risk factor modification. AS: His most recent echocardiogram did not show aortic stenosis but only aortic valve sclerosis. HTN: Very well-controlled typically, blood pressure is just slightly high  today.  No changes were made on his medications will continue to monitor. NSVT: Continues to have rare and very brief events recorded by his pacemaker, asymptomatic, not associated with exertion.  No specific therapy is recommended. HLP: All lipid parameters in target range.  Continue statin. DM: Despite being very physically active and very lean he has type 2 diabetes mellitus, adequately controlled on metformin monotherapy.    Medication Adjustments/Labs and Tests Ordered: Current medicines are reviewed at length with the patient today.  Concerns regarding medicines are outlined above. Labs and tests ordered and medication changes are outlined in the patient instructions below:  Patient Instructions  Medication Instructions:  No changes *If you need a refill on your cardiac medications before your next appointment, please call your pharmacy*  Follow-Up: At [facility], you and your health needs are our priority.  As part of our continuing mission to provide you with exceptional heart care, we have created designated Provider Care Teams.  These Care Teams include your primary Cardiologist (physician) and Advanced Practice Providers (APPs -  Physician Assistants and Nurse Practitioners) who all work together to provide you with the care you need, when you need it.  We recommend signing up for the patient portal called "MyChart".  Sign up information is provided on this After Visit Summary.  MyChart is used to connect with patients for Virtual Visits (Telemedicine).  Patients are able to view lab/test results, encounter notes, upcoming appointments, etc.  Non-urgent messages can be sent to your provider as well.   To learn more about what you can do with MyChart, go to [email or web address].    Your next appointment:   1 year(s)  Provider:   Dr Royann Shivers        Signed, Thurmon Fair, MD  02/05/2023 5:35 PM    [facility]

## 2023-02-04 NOTE — Patient Instructions (Signed)

## 2023-02-05 ENCOUNTER — Encounter: Payer: Self-pay | Admitting: Cardiovascular Disease

## 2023-02-19 ENCOUNTER — Encounter: Payer: Self-pay | Admitting: Cardiovascular Disease

## 2023-02-19 ENCOUNTER — Ambulatory Visit: Payer: Medicare Other | Attending: Cardiovascular Disease | Admitting: Cardiovascular Disease

## 2023-02-19 VITALS — BP 104/64 | HR 61 | Ht 71.0 in | Wt 158.0 lb

## 2023-02-19 DIAGNOSIS — E785 Hyperlipidemia, unspecified: Secondary | ICD-10-CM | POA: Diagnosis present

## 2023-02-19 DIAGNOSIS — E119 Type 2 diabetes mellitus without complications: Secondary | ICD-10-CM | POA: Diagnosis present

## 2023-02-19 DIAGNOSIS — I10 Essential (primary) hypertension: Secondary | ICD-10-CM | POA: Diagnosis present

## 2023-02-19 DIAGNOSIS — I4729 Other ventricular tachycardia: Secondary | ICD-10-CM | POA: Diagnosis present

## 2023-02-19 DIAGNOSIS — E7800 Pure hypercholesterolemia, unspecified: Secondary | ICD-10-CM

## 2023-02-19 DIAGNOSIS — I350 Nonrheumatic aortic (valve) stenosis: Secondary | ICD-10-CM

## 2023-02-19 DIAGNOSIS — I484 Atypical atrial flutter: Secondary | ICD-10-CM

## 2023-02-19 DIAGNOSIS — Z950 Presence of cardiac pacemaker: Secondary | ICD-10-CM

## 2023-02-19 DIAGNOSIS — I495 Sick sinus syndrome: Secondary | ICD-10-CM

## 2023-02-19 DIAGNOSIS — I2510 Atherosclerotic heart disease of native coronary artery without angina pectoris: Secondary | ICD-10-CM

## 2023-02-19 DIAGNOSIS — I358 Other nonrheumatic aortic valve disorders: Secondary | ICD-10-CM

## 2023-02-19 NOTE — Patient Instructions (Signed)
 Medication Instructions:  No medication changes were made during today's visit  *If you need a refill on your cardiac medications before your next appointment, please call your pharmacy*   Lab Work: No labs were ordered during today's visit.  If you have labs (blood work) drawn today and your tests are completely normal, you will receive your results only by: MyChart Message (if you have MyChart) OR A paper copy in the mail If you have any lab test that is abnormal or we need to change your treatment, we will call you to review the results.   Testing/Procedures: No procedures ordered today.    Follow-Up: At [facility], you and your health needs are our priority.  As part of our continuing mission to provide you with exceptional heart care, we have created designated Provider Care Teams.  These Care Teams include your primary Cardiologist (physician) and Advanced Practice Providers (APPs -  Physician Assistants and Nurse Practitioners) who all work together to provide you with the care you need, when you need it.  We recommend signing up for the patient portal called "MyChart".  Sign up information is provided on this After Visit Summary.  MyChart is used to connect with patients for Virtual Visits (Telemedicine).  Patients are able to view lab/test results, encounter notes, upcoming appointments, etc.  Non-urgent messages can be sent to your provider as well.   To learn more about what you can do with MyChart, go to [email or web address].    Your next appointment:   1 year(s)  Provider:   Dr. Thurmon Fair    Other Instructions Thank you for choosing [facility]!

## 2023-02-19 NOTE — Progress Notes (Signed)
 Cardiology Office Note    Date:  02/21/2023   ID:  Travis [name] 11-08-1940, MRN [id number]  PCP:  Tisovec, Richard W, MD  Referring M.D.: Dr. Valery Ripple  Cardiologist:  Debby Sor, MD    History of Present Illness:  Travis [name] is a 83 y.o. male who was initially referred through the courtesy of Dr.Varadarajan for evaluation of lightheadedness and dizziness while running. He presents for a 23-month follow-up evaluation.  Travis [name] has been active for his entire life.  He has a history of hypertension, hyperlipidemia, and has been diagnosed with type 2 diabetes mellitus for 14 years.  He exercises at least 6 days per week for 1-2 hours.  He does resistance training, spin classes, and typically runs anywhere from 5-6 miles at a 9-10 minute mile pace and has been doing so for many years.  His pulse typically runs in the low 30s.  Recently, he has begun to notice that he becomes dizzy when he starts to run.  This is associated with mild shortness of breath.  He denies any chest pain.  The symptoms have occurred since January.  He denies any frank syncope.  He denies the lightheaded symptoms when he is at rest and his symptoms only occur with significant activity.  When he does spin class he is unaware of any  symptoms.    When I saw for initial evaluation, his ECG revealed marked sinus bradycardia with ventricular rate averaging 37 bpm.  PR interval was 190 ms and QTc interval was 335 ms.  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  He had a 2/6 systolic murmur on physical exam, suggestive of possible aortic valve disease.  I scheduled him for a 2-D echo Doppler study which was done on 06/02/2016.  This showed an ejection fraction at 55-65%.  There was mild LVH.  He did not have any regional wall motion abnormalities.  His aortic valve was reported as structurally normal.  There was a mean gradient of 9 and a  peak gradient of 17.  There was mild MR and mild LA dilation.  His right atrium was mild moderately dilated.  I obtain a magnesium  level, which was normal.  I scheduled him for an exercise nuclear stress test to assess both chronotropic competence to exercise as well as myocardial perfusion.  On his exercise portion.  His resting heart rate was 34 bpm, which rose to a maximum of 126/m.  He completed 2 minutes and 2 seconds into stage III of Bruce protocol.  He was able to augment his heart rate appropriately with exercise.  At the end of stage I reflective of good aerobic conditioning ventricular rate was 71, at the end of stage II 100, and in stage III maximum heart rate was 125.  He was entirely asymptomatic but had an exaggerated blood pressure response opted to 16/75 and at peak stress developed asymptomatic ST segment depression up to 3 mm in the inferior and inferolateral leads.  There was a very small mild distal anteroseptal defect which most likely was artifact.  There was no ischemia identified.  He has been wearing an event monitor.  We werereceivingdaily notifications demonstrating marked bradycardia with heart rates in the 30s.  These have been sinus rhythm.  He has been entirely asymptomatic.  He had developed sinus pauses up to 5 - 6 seconds.  He remained entirely asymptomatic and specifically  denied any dizziness or lightheadedness or chest pain.  He was unaware of sleep apnea but he does snore.    At  follow-up on 06/04/2016  I had extensive discussion with him, both concerning his ST segment changes on ECG during stress testing as well as his need for a permanent pacemaker.  Due to scheduling difficulties and since he remained asymptomatic, ultimately, both of these procedures were done on 06/10/2016.  Diagnostic catheterization revealed normal LV function.  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  There was mild 30 and 40% smooth  circumflex narrowings.  Later that same day in the afternoon he underwent successful insertion of a permanent pacemaker by Dr. 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  He tolerated both procedures well.  Following his pacemaker insertion, he has continued to run and has been without dizziness or lightheadedness. He saw Dr. Francyne on 09/15/2016 for pacemaker follow-up evaluation.  He had normal device function.  He is now scheduled to undergo remote download every 3 months and have yearly office visits with Dr. Francyne. His blood pressure at that time was elevated his blood pressure at that time was elevated He is now scheduled to undergo remote downloads every 3 months  He had normal device function.  And no longer experiences any dizziness or lightheadedness.  He saw Dr. Francyne on September 15, 2016 for pacemaker follow-up evaluation.   His blood pressure that day was elevated and his lisinopril  was increased to 20 mg and he continued to be on amlodipine .  He feels his blood pressure has been better.  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  In early 2018 had been running approximately 30 miles per week and denied chest pain, PND orthopnea.   I added Zetia  to his regimen of atorvastatin .  This is resulted in marked improvement in lipid status and blood work done by his primary physician on March 03, 2017 showed an LDL cholesterol at 31.  He is on lisinopril  20 mg and amlodipine  5 mg for hypertension.  He is on metformin  for mild type 2 diabetes mellitus.  Recent hemoglobin A1c was 6.3.  He has had some mild knee discomfort and for this reason has elected not to participate in the nationals and running in June.   When I saw him in December 2019 he continued to remain asymptomatic.  He still runs but he had  fractured his left kneecap which had limit his is activity for some time.  He was unaware of any palpitations.  He denies any chest pressure PND orthopnea.  He underwent repeat laboratory on December 17, 2017 which showed excellent lipid studies with total cholesterol 119, HDL 67, LDL 43, and triglycerides 45.    I evaluated him in a telemedicine visit on June 28, 2018 and he continued to feel well. He now sees Dr. Tisovec for his primary care.  He continues to be active and is running 4 to 5 miles per day at least 6 days/week.  He denies any chest tightness or pressure.  He denies any lightheadedness.  He denies PND orthopnea.  A pacemaker remote check on June 2 which revealed stable pacemaker function with stable lead measurements and heart rate histogram.  There apparently was 1 event consistent  with a 12 beats of SVT.  The patient remains asymptomatic.   He saw Dr. Francyne in September 2020.  Presently he remains asymptomatic.  He is running 6 days/week for at least 5 miles a day at a 10 to 11 mile pace.  Blood pressure at home has been slightly elevated in the 140s systolically.  He denies palpitations.  He denies shortness of breath.  He denies leg swelling.    I saw him on December 26, 2018.  His blood pressure was elevated and at home had been running in the 140 systolically and on repeat by me in the office had increased to 168/88.  I recommended titration of amlodipine  7.5 mg daily if blood pressure continues to be increased after 2 weeks of treatment further titration to 10 mg was recommended.  When I saw him in April 2021 he felt well.  His blood pressure had significantly improved.   He continues to run most days at least 5 miles at a time typically at 11 to 12-minute pace.  He underwent an echo Doppler study in January 2021 which showed an EF of 55 to 60% with grade 1 diastolic dysfunction.  There was mild to moderate aortic valve sclerosis without stenosis.  There was moderate dilation of his  left atrium with mild dilation of his right atrium.  When I last saw him he did have a suggestion of a possible squeaky friction rub which was positional.  His pericardium was normal with no effusion.  He has noted some swelling in his ankles.  I saw him on December 04, 2019 and since his prior evaluation he continued to be active.     Three weeks ago he ran a half marathon at age 14 year old and finished in first place at a time of 2 hours and 57 minutes.  He continues to have follow-up pacemaker checks remotely had seen Dr. Francyne on October 23, 2019.  Pacemaker interrogation demonstrated normal device function with anticipated generator longevity of about 10.8 years.  He denies chest pain or shortness of breath.  He had had laboratory done by Dr. Tisovec.  Labs were stable although initial PSA was mildly increased at 4.8 which he states on follow-up had improved.  He continues to be on triple drug therapy with blood pressure control.  Amlodipine  5 mg, hydrochlorothiazide  12.5 mg, lisinopril  20 mg.  He is on atorvastatin  40 mg and Zetia  10 mg for hyperlipidemia.  He continues to be on Metformin  500 mg twice a day for diabetes mellitus.    Mr. Bellanca was evaluated by Dr. Francyne on October 28, 2020 and he had normal pacemaker device function.  I  saw him on 01/05/2021. He continued to feel well but approximately 1 month ago he had a fall and as result has not been running as he had in the past.  He denied any chest pain or exertional dyspnea.  He is still planning to run in several 5 and 10K races.  He denies any lightheadedness.  He is unaware of palpitations.    He underwent a follow-up echo Doppler study on December 26, 2021.  This continues to show normal systolic function with normal RV pressure.  There was mild MR, mild to moderate TR, and aortic valve sclerosis.  He saw Dr. Francyne for pacemaker follow-up in January 15, 2022.  He remained stable.  Heart rate histogram distribution suggest current  sensor settings are appropriate.  He did not have any clinically significant episodes of high ventricular  rate or atrial mode switch..  I last saw him on 2022-01-21. Unfortunately his wife died 3 months ago after she was cardioverted in the emergency room for A-fib and apparently died 2 days later at home.  His 2 daughters have been checking on him regularly.  He continues to be active and runs at least 4 days/week and goes to the gym the other 3 days.  He still participates in occasional 5K runs but has not traveled for these races.  He underwent on December 26, 2021.  This showed normal LV function with EF 60 to 65%, normal RV pressures mild MR, mild to moderate TR, and mild aortic sclerosis with mild AR.  He denies chest pain shortness of breath or palpitations.  He sees Dr. Tisovec.  Lipid studies in November 2023 were excellent with LDL cholesterol 47.  Hemoglobin A1c was 6.3.  Renal function was normal.    Over the past year, he has continued to do well.  On October 11, 2022 he presented to the emergency room after suffering a mechanical fall when he was running in his toe was caught on an uneven sidewalk.  He suffered significant abrasion near shoulder as well as hand in May which ultimately resolved.  He was recently evaluated by Dr. Francyne for pacemaker follow-up on February 04, 2023.  Device check did reveal a 54-minute episode of atrial flutter with variable AV block and normal ventricular rate on December 28, 2021.  The episode was asymptomatic and occurred while sleeping.  He was also found to have very rare and very brief episodes of nonsustained VT.  Presently, Mr. Goynes feels well.  He is continuing to run 3 to 4 days/week he denies chest pain or shortness of breath.  He denies recent dizziness.  He continues to be on amlodipine  10 mg, hydrochlorothiazide  12.5 mg, and lisinopril  20 mg for hypertension.  He is on Zetia  and atorvastatin  40 mg for hyperlipidemia.  He is diabetic on  metformin .  He continues to take aspirin  81 mg.  He presents for yearly evaluation.   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   SSS (sick sinus syndrome) (HCC) 06/10/2016   Type II diabetes mellitus (HCC)     Past Surgical History:  Procedure Laterality Date   APPENDECTOMY     CARDIAC CATHETERIZATION  06/10/2016   CATARACT EXTRACTION W/ INTRAOCULAR LENS IMPLANT Right    CYSTOSCOPY W/ STONE MANIPULATION     INSERT / REPLACE / REMOVE PACEMAKER  06/10/2016   dual chamber permanent pacemaker   KNEE ARTHROSCOPY W/ MENISCECTOMY Bilateral    LAPAROSCOPIC CHOLECYSTECTOMY     LEFT HEART CATH AND CORONARY ANGIOGRAPHY N/A 06/10/2016   Procedure: Left Heart Cath and Coronary Angiography;  Surgeon: Cherrie Toribio SAUNDERS, MD;  Location: [facility];  Service: Cardiovascular;  Laterality: N/A;   LITHOTRIPSY     PACEMAKER IMPLANT N/A 06/10/2016   Procedure: Pacemaker Implant;  Surgeon: Francyne Headland, MD;  Location: [facility];  Service: Cardiovascular;  Laterality: N/A;   TONSILLECTOMY      Current Medications: Outpatient Medications Prior to Visit  Medication Sig Dispense Refill   amLODipine  (NORVASC ) 10 MG tablet  aspirin  EC 81 MG tablet Take 81 mg by mouth daily.     atorvastatin  (LIPITOR) 40 MG tablet TAKE ONE TABLET BY MOUTH DAILY. 90 tablet 3   Blood Glucose Monitoring Suppl (ONETOUCH VERIO FLEX SYSTEM) w/Device KIT Use to test blood sugars 2-3x a week     Cholecalciferol  (VITAMIN D3) 2000 units capsule Take 2,000 Units by mouth daily.     Cholecalciferol  125 MCG (5000 UT) capsule Take 5,000 Units by mouth daily.     COMBIGAN  0.2-0.5 % ophthalmic solution Place 1 drop into both eyes 2 (two) times daily.      ezetimibe   (ZETIA ) 10 MG tablet TAKE ONE TABLET BY MOUTH DAILY. 90 tablet 0   glucose blood (ONETOUCH VERIO) test strip Use to test blood sugars 2-3x a week E11.9     hydrochlorothiazide  (MICROZIDE ) 12.5 MG capsule TAKE ONE CAPSULE EVERY DAY. 90 capsule 3   lisinopril  (ZESTRIL ) 20 MG tablet TAKE ONE TABLET BY MOUTH ONCE DAILY 90 tablet 1   metFORMIN  (GLUCOPHAGE ) 500 MG tablet Take 500 mg by mouth 2 (two) times daily with a meal.      No facility-administered medications prior to visit.     Allergies:   Patient has no known allergies.   Social History   Socioeconomic History   Marital status: Widowed    Spouse name: Not on file   Number of children: Not on file   Years of education: Not on file   Highest education level: Not on file  Occupational History   Not on file  Tobacco Use   Smoking status: Former    Current packs/day: 0.00    Average packs/day: 0.5 packs/day for 25.0 years (12.5 ttl pk-yrs)    Types: Cigarettes    Start date: 3    Quit date: 63    Years since quitting: 42.1   Smokeless tobacco: Never  Vaping Use   Vaping status: Never Used  Substance and Sexual Activity   Alcohol use: Yes    Comment: 06/10/2016 might have 2 beers/month   Drug use: No   Sexual activity: Not on file  Other Topics Concern   Not on file  Social History Narrative   Not on file   Social Drivers of Health   Financial Resource Strain: Not on file  Food Insecurity: Low Risk  (10/11/2022)   Received from [facility]   Hunger Vital Sign    Worried About Running Out of Food in the Last Year: Never true    Ran Out of Food in the Last Year: Never true  Transportation Needs: No Transportation Needs (10/11/2022)   Received from [facility]    In the past 12 months, has lack of reliable transportation kept you from medical appointments, meetings, work or from getting things needed for daily living? : No  Physical Activity: Not on file  Stress: Not on file  Social  Connections: Not on file    Social history is notable in that he was born in [place] .  He's been married for over 55 years.  He has 2 children, 7 grandchildren, and one great-grandchild.  He has a event organiser in engineer, technical sales.  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  He drinks occasional beer.  He does not smoke.   Family History:  The patient's family history includes Colon cancer in his father..  He has a brother age in his 25s and a sister  in the 80s, who are healthy.   ROS General: Negative; No fevers, chills, or night sweats;  HEENT: Negative; No changes in vision or hearing, sinus congestion, difficulty swallowing Pulmonary: Negative; No cough, wheezing, shortness of breath, hemoptysis Cardiovascular:  See HPI GI: Negative; No nausea, vomiting, diarrhea, or abdominal pain GU: Negative; No dysuria, hematuria, or difficulty voiding Musculoskeletal: Left knee discomfort Hematologic/Oncology: Negative; no easy bruising, bleeding Endocrine: Negative; no heat/cold intolerance; no diabetes Neuro: Negative; no changes in balance, headaches Skin: Negative; No rashes or skin lesions Psychiatric: Negative; No behavioral problems, depression Sleep: Positive for snoring, no daytime sleepiness, hypersomnolence, bruxism, restless legs, hypnogognic hallucinations, no cataplexy Other comprehensive 14 point system review is negative.   PHYSICAL EXAM:   VS:  BP 104/64 (BP Location: Left Arm, Patient Position: Sitting, Cuff Size: Normal)   Pulse 61   Ht 5' 11 (1.803 m)   Wt 158 lb (71.7 kg)   SpO2 97%   BMI 22.04 kg/m     Repeat blood pressure by me was 110/64.  Wt Readings from Last 3 Encounters:  02/19/23 158 lb (71.7 kg)  02/04/23 158 lb 6.4 oz (71.8 kg)  01/19/22 155 lb 3.2 oz (70.4 kg)    General: Alert, oriented, no distress.  Appears younger than stated age. Skin: normal turgor, no rashes, warm and dry HEENT: Normocephalic,  atraumatic. Pupils equal round and reactive to light; sclera anicteric; extraocular muscles intact;  Nose without nasal septal hypertrophy Mouth/Parynx benign; Mallinpatti scale 2 Neck: No JVD, no carotid bruits; normal carotid upstroke Lungs: clear to ausculatation and percussion; no wheezing or rales Chest wall: without tenderness to palpitation Heart: PMI not displaced, RRR, s1 s2 normal, 1/6 systolic murmur, no diastolic murmur, no rubs, gallops, thrills, or heaves Abdomen: soft, nontender; no hepatosplenomehaly, BS+; abdominal aorta nontender and not dilated by palpation. Back: no CVA tenderness Pulses 2+ Musculoskeletal: full range of motion, normal strength, no joint deformities Extremities: no clubbing cyanosis or edema, Homan's sign negative  Neurologic: grossly nonfocal; Cranial nerves grossly wnl Psychologic: Normal mood and affect       Studies/Labs Reviewed:   EKG Interpretation Date/Time:  Friday February 19 2023 09:29:02 EST Ventricular Rate:  61 PR Interval:  282 QRS Duration:  84 QT Interval:  348 QTC Calculation: 350 R Axis:   11  Text Interpretation: Atrial-paced rhythm with prolonged AV conduction When compared with ECG of 04-Feb-2023 10:49, Electronic atrial pacemaker has replaced Sinus rhythm QRS duration has decreased ST no longer depressed in Lateral leads Confirmed by Burnard Ned ([id number]) on 02/21/2023 9:52:40 AM    I personally reviewed the ECG of January 15, 2022 ECG (independently read by me): Atrially paced at 55 bpm with prolonged AV conduction  December 26, 2020 ECG (independently read by me): Atrial paced, PR 276 msec, nonspecific T abnormality  December 04, 2019 ECG (independently read by me): Atrially paced rhythm at 60 bpm with prolonged AV conduction with a PR interval of 234 ms.  No ectopy.  April 2021 ECG (independently read by me): Atrially paced at 55 bpm.  PR interval 218 ms.  No ectopy.  December 2020 ECG (independently read by me):  Atrially paced rhythm at 69 bpm with prolonged AV conduction, PR interval 222 ms.  LVH with repolarization changes.  December 2019 ECG (independently read by me): Atrially paced rhythm with PVCs and transient ventricular bigeminy.  Prolonged AV conduction with PR interval 230 ms.  May 2019 ECG (independently read by me): Atrial paced rhythm at 51  bpm.  Prolonged AV conduction with a PR interval of 242 ms.  QTc interval 383 ms.  November 24, 2016 EKG:  EKG is ordered today.  A paced rhythm at 51 bpm.  PR interval 222 ms.  Mild LVH.  QTc interval 372 ms  06/16/2016 ECG (independently read by me): Atrially paced rhythm at 50 bpm with prolonged AV conduction with a PR interval at 214 ms.  06/04/2016 ECG (independently read by me): Marked sinus bradycardia with possible low atrial involvement.  PR interval 166 ms, QTc interval 335 ms.  Ventricular rate 36.  05/20/2016 ECG (independently read by me): Marked sinus bradycardia with sinus arrhythmia, ventricular rate in the 40s to upper 30s, average 37.  PR interval 190 ms.  QTc interval 335 ms.  No ST segment changes.  Recent Labs:    Latest Ref Rng & Units 12/17/2017    8:42 AM 03/03/2017    8:13 AM 11/27/2016    2:09 PM  BMP  Glucose 65 - 99 mg/dL 864  880  891   BUN 8 - 27 mg/dL 18  22  23    Creatinine 0.76 - 1.27 mg/dL 8.98  8.86  8.87   BUN/Creat Ratio 10 - 24 18  19  21    Sodium 134 - 144 mmol/L 141  140  138   Potassium 3.5 - 5.2 mmol/L 4.5  4.7  4.2   Chloride 96 - 106 mmol/L 102  106  98   CO2 20 - 29 mmol/L 23  23  26    Calcium  8.6 - 10.2 mg/dL 89.7  89.7  89.7         Latest Ref Rng & Units 12/17/2017    8:42 AM 03/03/2017    8:13 AM 11/17/2016   10:51 AM  Hepatic Function  Total Protein 6.0 - 8.5 g/dL 6.5  6.4  7.1   Albumin 3.5 - 4.8 g/dL 4.6  4.5  4.8   AST 0 - 40 IU/L 17  19  20    ALT 0 - 44 IU/L 21  21  19    Alk Phosphatase 39 - 117 IU/L 77  62  82   Total Bilirubin 0.0 - 1.2 mg/dL 1.1  1.2  1.3        Latest Ref  Rng & Units 06/04/2016   12:00 AM  CBC  WBC 3.4 - 10.8 x10E3/uL 7.1   Hemoglobin 13.0 - 17.7 g/dL 85.7   Hematocrit 62.4 - 51.0 % 41.9   Platelets 150 - 379 x10E3/uL 260    Lab Results  Component Value Date   MCV 96 06/04/2016   No results found for: TSH Lab Results  Component Value Date   HGBA1C 7.7 (H) 12/17/2017     BNP No results found for: BNP  ProBNP No results found for: PROBNP   Lipid Panel     Component Value Date/Time   CHOL 119 12/17/2017 0839   TRIG 45 12/17/2017 0839   HDL 67 12/17/2017 0839   CHOLHDL 1.8 12/17/2017 0839   LDLCALC 43 12/17/2017 0839     RADIOLOGY: No results found.   Additional studies/ records that were reviewed today include:  I reviewed the recent records from [place]. I have reviewed his 2-D echo Doppler study, exercise Myoview  study, laboratory, as well as daily  Monitors with critical notification from the event monitor prior to the insertion of the pacemaker.  I personally reviewed the catheterization findings, as well as his pacemaker implantation report.  Subsequent office visits  with Dr. Francyne have been reviewed as well as recent laboratory.   ECHO: 12/26/2021  1. Left ventricular ejection fraction, by estimation, is 60 to 65%. The  left ventricle has normal function. The left ventricle has no regional  wall motion abnormalities. Left ventricular diastolic parameters are  indeterminate.   2. Right ventricular systolic function is normal. The right ventricular  size is normal. There is normal pulmonary artery systolic pressure. The  estimated right ventricular systolic pressure is 28.6 mmHg.   3. The mitral valve is normal in structure. Mild mitral valve  regurgitation. No evidence of mitral stenosis.   4. Tricuspid valve regurgitation is mild to moderate.   5. The aortic valve is normal in structure. Aortic valve regurgitation is  mild. Aortic valve sclerosis/calcification is present, without any  evidence of  aortic stenosis.   6. The inferior vena cava is normal in size with greater than 50%  respiratory variability, suggesting right atrial pressure of 3 mmHg.   ASSESSMENT:    1. Coronary artery disease involving native coronary artery of native heart without angina pectoris   2. Essential hypertension   3. Hyperlipidemia with target LDL less than 70   4. Atypical atrial flutter (HCC)   5. SSS (sick sinus syndrome) (HCC)   6. Pacemaker   7. Aortic valve sclerosis   8. NSVT (nonsustained ventricular tachycardia) (HCC)   9. Type 2 diabetes mellitus without complication, without long-term current use of insulin  [facility])     PLAN:  Mr. Lamaj Metoyer is a very pleasant, young appearing active 83 year-old gentleman who has a long-standing history of significant exercise and previously had exercised for at least 1-2 hours per day 6 days per week.  When I initially saw him he was running 5 to 6 miles at a time and was doing spin class.  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  Prior to his pacemaker cardiac catheterization revealed a 60 to 70% calcified LAD stenosis with normal LV function.  Subsequently, he has continued to be asymptomatic.  We have been very aggressive with lipid-lowering therapy and LDL cholesterol have been running in the 40s.  His  lipid panel from November 21, 2021 shows total cholesterol 114, HDL 54, LDL 47, and triglycerides 63 and from November 2024 showed total cholesterol 125 LDL 51 HDL 62 and triglycerides 58.  He has been maintained on a blood pressure regimen consisting of amlodipine  10 mg, hydrochlorothiazide  12.5 mg, lisinopril  20 mg daily.  He can continues to be on atorvastatin  40 mg daily for his hyperlipidemia.  He is followed by Dr. Francyne for his pacemaker.  Recent device check did show an episode of nocturnal and asymptomatic atrial flutter with variable  block on December 29, 2022.  Presently, he remains asymptomatic.  Blood pressure today is stable.  ECG today shows atrially paced rhythm at 61 bpm with prolonged AV conduction with PR interval 282 ms.  Clinically, he continues to do exceptionally well.  He remains active and is continuing to run at least 4 days/week.  His most recent echo Doppler study had shown normal systolic function with EF 60 to 65%, mild MR, mild to moderate TR and aortic sclerosis with mild AR.  He sees Dr. Tisovec for primary care.  I discussed with him my plans for retirement this year.  Since he already sees Dr. Francyne for his pacemaker follow-up I  have recommended he see Dr. Francyne for his comprehensive cardiology care.  Is been a pleasure taking care of him for these many years.   Medication Adjustments/Labs and Tests Ordered: Current medicines are reviewed at length with the patient today.  Concerns regarding medicines are outlined above.  Medication changes, Labs and Tests ordered today are listed in the Patient Instructions below. Patient Instructions  Medication Instructions:  No medication changes were made during today's visit  *If you need a refill on your cardiac medications before your next appointment, please call your pharmacy*   Lab Work: No labs were ordered during today's visit.  If you have labs (blood work) drawn today and your tests are completely normal, you will receive your results only by: MyChart Message (if you have MyChart) OR A paper copy in the mail If you have any lab test that is abnormal or we need to change your treatment, we will call you to review the results.   Testing/Procedures: No procedures ordered today.    Follow-Up: At [facility], you and your health needs are our priority.  As part of our continuing mission to provide you with exceptional heart care, we have created designated Provider Care Teams.  These Care Teams include your primary Cardiologist  (physician) and Advanced Practice Providers (APPs -  Physician Assistants and Nurse Practitioners) who all work together to provide you with the care you need, when you need it.  We recommend signing up for the patient portal called MyChart.  Sign up information is provided on this After Visit Summary.  MyChart is used to connect with patients for Virtual Visits (Telemedicine).  Patients are able to view lab/test results, encounter notes, upcoming appointments, etc.  Non-urgent messages can be sent to your provider as well.   To learn more about what you can do with MyChart, go to [email or web address].    Your next appointment:   1 year(s)  Provider:   Dr. [name] Francyne     Other Instructions Thank you for choosing [facility]!       Signed, Debby Sor, MD  02/21/2023 10:00 AM    [facility] [address], [address], [place], [place]  [address] Phone: [phone]

## 2023-02-21 ENCOUNTER — Encounter: Payer: Self-pay | Admitting: Cardiovascular Disease

## 2023-03-04 NOTE — Addendum Note (Signed)
Addended by: Geralyn Flash D on: 03/04/2023 11:03 AM   Modules accepted: Orders

## 2023-03-04 NOTE — Progress Notes (Signed)
 Remote pacemaker transmission.

## 2023-03-19 ENCOUNTER — Encounter: Payer: Self-pay | Admitting: Podiatry

## 2023-03-19 ENCOUNTER — Ambulatory Visit: Admitting: Podiatry

## 2023-03-19 DIAGNOSIS — B351 Tinea unguium: Secondary | ICD-10-CM | POA: Diagnosis not present

## 2023-03-19 DIAGNOSIS — Q828 Other specified congenital malformations of skin: Secondary | ICD-10-CM | POA: Diagnosis not present

## 2023-03-19 DIAGNOSIS — M79674 Pain in right toe(s): Secondary | ICD-10-CM

## 2023-03-19 DIAGNOSIS — E1140 Type 2 diabetes mellitus with diabetic neuropathy, unspecified: Secondary | ICD-10-CM

## 2023-03-19 DIAGNOSIS — M79675 Pain in left toe(s): Secondary | ICD-10-CM | POA: Diagnosis not present

## 2023-03-19 DIAGNOSIS — E1149 Type 2 diabetes mellitus with other diabetic neurological complication: Secondary | ICD-10-CM

## 2023-03-23 NOTE — Progress Notes (Signed)
 Subjective:   Patient ID: Travis [name], male   DOB: 83 y.o.   MRN: [id number]   HPI Patient presents with long-term diabetes who is developed lesions underneath the fifth head of both feet that have become increasingly painful and elongated thickened nailbeds 1-5 both feet   ROS      Objective:  Physical Exam  Neurovascular status intact with patient found to have keratotic lesion subfifth metatarsal head both feet painful when pressed diminishment of sharp dull vibratory bilateral and thick yellow brittle nailbeds 1-5 both feet     Assessment:  Chronic lesions secondary to pressure with high risk with diabetes and mycotic nail infection 1-5 both feet     Plan:  H&P reviewed and I went ahead and did sterile debridement of lesions bilateral no iatrogenic bleeding and nailbeds 1-5 both feet no iatrogenic bleeding reappoint routine care

## 2023-03-28 ENCOUNTER — Other Ambulatory Visit: Payer: Self-pay | Admitting: Cardiovascular Disease

## 2023-04-14 ENCOUNTER — Ambulatory Visit (INDEPENDENT_AMBULATORY_CARE_PROVIDER_SITE_OTHER): Payer: Medicare Other | Admitting: Podiatry

## 2023-04-14 ENCOUNTER — Other Ambulatory Visit: Payer: Self-pay | Admitting: Cardiovascular Disease

## 2023-04-14 ENCOUNTER — Encounter: Payer: Self-pay | Admitting: Podiatry

## 2023-04-14 VITALS — Ht 71.0 in | Wt 158.0 lb

## 2023-04-14 DIAGNOSIS — E119 Type 2 diabetes mellitus without complications: Secondary | ICD-10-CM

## 2023-04-14 DIAGNOSIS — Q828 Other specified congenital malformations of skin: Secondary | ICD-10-CM

## 2023-04-14 DIAGNOSIS — B351 Tinea unguium: Secondary | ICD-10-CM

## 2023-04-14 NOTE — Progress Notes (Signed)
  Subjective:  Patient ID: Travis [name], male    DOB: [birth year],  MRN: [id number]  83 y.o. male presents to clinic with  preventative diabetic foot care for painful porokeratotic lesion(s) of both feet   He was seen several weeks ago and states feet remain painful. He relates porokeratotic lesions are aggravated when weightbearing with and without shoegear. Pain is relieved with periodic professional debridement.   New problem(s): None   PCP is Tisovec, Adelfa Koh, MD.  No Known Allergies  Review of Systems: Negative except as noted in the HPI.   Objective:  [name] is a pleasant 83 y.o. male WD, WN in NAD. AAO x 3.  Vascular Examination: Vascular status intact b/l with palpable pedal pulses. CFT immediate b/l. No edema. No pain with calf compression b/l. Skin temperature gradient WNL b/l. No cyanosis or clubbing noted b/l LE.  Neurological Examination: Sensation grossly intact b/l with 10 gram monofilament. Vibratory sensation intact b/l.   Dermatological Examination: Pedal skin with normal turgor, texture and tone b/l.   Toenails recently debrided. Porokeratotic lesion(s) right heel and submet head 3 left foot. No erythema, no edema, no drainage, no fluctuance.  Musculoskeletal Examination: Muscle strength 5/5 to b/l LE. Muscle strength 5/5 to all lower extremity muscle groups bilaterally. No pain, crepitus or joint limitation noted with ROM bilateral LE. Pes planus deformity noted bilateral LE. Patient ambulates independent of any assistive aids.  Radiographs: None  Last A1c:       No data to display           Assessment:   1. Porokeratosis   2. Diabetes mellitus without complication (HCC)    Plan:  Patient remains symptomatic after previosu treatment. As a courtesy, patient was evaluated and treated. All patient's and/or POA's questions/concerns addressed on today's visit. As a courtesy, porokeratotic lesion(s) right heel and submet head 3 left foot pared with  sharp debridement without incident. Continue daily foot inspections and monitor blood glucose per PCP/Endocrinologist's recommendations. Continue soft, supportive shoe gear daily. Report any pedal injuries to medical professional. Call office if there are any questions/concerns.  Return in about 3 months (around 07/14/2023).  Freddie Breech, DPM       LOCATION: [address] [place] [address]                   Office [phone]   [facility] LOCATION: [address] [place], [place] [address] Office [phone]

## 2023-04-21 ENCOUNTER — Ambulatory Visit (INDEPENDENT_AMBULATORY_CARE_PROVIDER_SITE_OTHER): Payer: Medicare Other

## 2023-04-21 DIAGNOSIS — I495 Sick sinus syndrome: Secondary | ICD-10-CM

## 2023-04-23 LAB — CUP PACEART REMOTE DEVICE CHECK
Battery Remaining Longevity: 90 mo
Battery Voltage: 2.98 V
Brady Statistic AP VP Percent: 0.62 %
Brady Statistic AP VS Percent: 72.79 %
Brady Statistic AS VP Percent: 0.05 %
Brady Statistic AS VS Percent: 26.54 %
Brady Statistic RA Percent Paced: 73.48 %
Brady Statistic RV Percent Paced: 0.67 %
Date Time Interrogation Session: 20250410145003
Implantable Lead Connection Status: 753985
Implantable Lead Connection Status: 753985
Implantable Lead Implant Date: 20180530
Implantable Lead Implant Date: 20180530
Implantable Lead Location: 753859
Implantable Lead Location: 753860
Implantable Lead Model: 5076
Implantable Lead Model: 5076
Implantable Pulse Generator Implant Date: 20180530
Lead Channel Impedance Value: 285 Ohm
Lead Channel Impedance Value: 323 Ohm
Lead Channel Impedance Value: 361 Ohm
Lead Channel Impedance Value: 399 Ohm
Lead Channel Pacing Threshold Amplitude: 0.625 V
Lead Channel Pacing Threshold Amplitude: 0.625 V
Lead Channel Pacing Threshold Pulse Width: 0.4 ms
Lead Channel Pacing Threshold Pulse Width: 0.4 ms
Lead Channel Sensing Intrinsic Amplitude: 11.5 mV
Lead Channel Sensing Intrinsic Amplitude: 11.5 mV
Lead Channel Sensing Intrinsic Amplitude: 3.625 mV
Lead Channel Sensing Intrinsic Amplitude: 3.625 mV
Lead Channel Setting Pacing Amplitude: 1.5 V
Lead Channel Setting Pacing Amplitude: 2 V
Lead Channel Setting Pacing Pulse Width: 0.4 ms
Lead Channel Setting Sensing Sensitivity: 2 mV
Zone Setting Status: 755011

## 2023-05-01 ENCOUNTER — Encounter: Payer: Self-pay | Admitting: Cardiovascular Disease

## 2023-05-18 ENCOUNTER — Telehealth: Payer: Self-pay

## 2023-05-18 NOTE — Telephone Encounter (Signed)
 Pt called after receiving a message about his remote. Per pt, the message stated it was time to turn in his remote. Per pt, he turned the remote in 1 month ago and is scheduled to turn it in again July 22, 2023. Please call pt back per pts request.

## 2023-05-21 NOTE — Telephone Encounter (Signed)
 I spoke with the pt and it looks like his monitor was unplug. That is why he got the message about his monitor. He agreed to plug his monitor back in.

## 2023-05-24 ENCOUNTER — Other Ambulatory Visit: Payer: Self-pay | Admitting: Cardiovascular Disease

## 2023-06-04 NOTE — Addendum Note (Signed)
 Addended by: Lott Rouleau A on: 06/04/2023 03:37 PM   Modules accepted: Orders

## 2023-06-04 NOTE — Progress Notes (Signed)
 Remote pacemaker transmission.

## 2023-06-09 ENCOUNTER — Telehealth: Payer: Self-pay | Admitting: Cardiovascular Disease

## 2023-06-09 NOTE — Telephone Encounter (Signed)
 Pt c/o medication issue:  1. Name of Medication:   amLODipine  (NORVASC ) 10 MG tablet    2. How are you currently taking this medication (dosage and times per day)? 5 mg once daily   3. Are you having a reaction (difficulty breathing--STAT)? No   4. What is your medication issue? Pt called in asking if he still needs to be on this med because pharmacy reported that it was discontinued when he went in for a refill.  5mg  was discontinued but pt states that's the one he has been taking. Please advise.   [facility] [place], [place] - [address] [facility] [address] Phone: [phone]  Fax: [phone]

## 2023-06-09 NOTE — Telephone Encounter (Signed)
Returned call to patient left message on personal voice mail to call back. 

## 2023-06-10 MED ORDER — AMLODIPINE BESYLATE 5 MG PO TABS
5.0000 mg | ORAL_TABLET | Freq: Every day | ORAL | 3 refills | Status: DC
Start: 2023-06-10 — End: 2023-11-19
  Filled 2023-09-14: qty 90, 90d supply, fill #0

## 2023-06-10 NOTE — Telephone Encounter (Signed)
 Spoke with pt, he reports he has been taking the 5 mg of the amlodipine . He reports his blood pressure is running fine at home. Script for amlodipine  5 mg sent to the pharmacy. He will let us  know if blood pressure trends up.

## 2023-06-11 LAB — LAB REPORT - SCANNED
A1c: 6.5
EGFR: 57.7

## 2023-06-14 ENCOUNTER — Other Ambulatory Visit (HOSPITAL_BASED_OUTPATIENT_CLINIC_OR_DEPARTMENT_OTHER): Payer: Self-pay

## 2023-06-14 ENCOUNTER — Other Ambulatory Visit: Payer: Self-pay

## 2023-06-14 ENCOUNTER — Telehealth: Payer: Self-pay

## 2023-06-14 MED ORDER — BRIMONIDINE TARTRATE-TIMOLOL 0.2-0.5 % OP SOLN
1.0000 [drp] | Freq: Two times a day (BID) | OPHTHALMIC | 5 refills | Status: AC
  Filled 2023-06-14: qty 15, 75d supply, fill #0
  Filled 2023-09-24: qty 5, 30d supply, fill #1

## 2023-06-14 MED ORDER — METFORMIN HCL 500 MG PO TABS
500.0000 mg | ORAL_TABLET | Freq: Two times a day (BID) | ORAL | 2 refills | Status: DC
Start: 2022-12-07 — End: 2023-11-21
  Filled 2023-08-02: qty 180, 90d supply, fill #0

## 2023-06-14 MED ORDER — BRIMONIDINE TARTRATE-TIMOLOL 0.2-0.5 % OP SOLN
1.0000 [drp] | Freq: Two times a day (BID) | OPHTHALMIC | 4 refills | Status: DC
Start: 2022-08-03 — End: 2023-10-01

## 2023-06-14 NOTE — Telephone Encounter (Signed)
 Alert received from [employer] for AF >6 hrs for 1 day. Presents in AF on the 6/2 remote that started on 6/2.  Not on OAC per EMR. Hx of PAF.  Sending due to AF on presenting EGM.  Burden 0.5% since 04/22/23.   Routing to Dr. Alvis Ba considering no OAC on MAR w/ increase AF burden that is >6 hour.

## 2023-06-14 NOTE — Telephone Encounter (Signed)
 Called patient to advise AF clinic referral per Dr. Alvis Ba. Patient is agreeable to plan and expecting call.

## 2023-06-14 NOTE — Telephone Encounter (Signed)
 He and I have discussed starting anticoagulation if we detected longer AFib..  I will be out of town for the next two weeks.  Can we get him an AFib clinic appt , please? They can check ECG then to see if he is still in AFib and start anticoagulation. Meanwhile, can we please get his latest chemistry panel from [facility], please? The most recent creatinine I have is from 2021 and it was mildly abnormal at 1.3. Would stop ASA and start Eliquis: 5 mg BID if creat < 1.5, 2.5 mg BID if creat > 1.5. Thanks

## 2023-06-16 ENCOUNTER — Encounter: Payer: Self-pay | Admitting: Internal Medicine

## 2023-06-24 ENCOUNTER — Other Ambulatory Visit (HOSPITAL_BASED_OUTPATIENT_CLINIC_OR_DEPARTMENT_OTHER): Payer: Self-pay

## 2023-06-24 ENCOUNTER — Ambulatory Visit (HOSPITAL_COMMUNITY)
Admission: RE | Admit: 2023-06-24 | Discharge: 2023-06-24 | Disposition: A | Discharge status: Home / Self Care | Source: Ambulatory Visit | Attending: Internal Medicine | Admitting: Internal Medicine

## 2023-06-24 VITALS — BP 124/76 | HR 72 | Ht 71.0 in | Wt 152.0 lb

## 2023-06-24 DIAGNOSIS — I4819 Other persistent atrial fibrillation: Secondary | ICD-10-CM | POA: Diagnosis present

## 2023-06-24 DIAGNOSIS — I4891 Unspecified atrial fibrillation: Secondary | ICD-10-CM | POA: Insufficient documentation

## 2023-06-24 DIAGNOSIS — D6869 Other thrombophilia: Secondary | ICD-10-CM | POA: Insufficient documentation

## 2023-06-24 MED ORDER — APIXABAN 5 MG PO TABS
5.0000 mg | ORAL_TABLET | Freq: Two times a day (BID) | ORAL | 3 refills | Status: DC
  Filled 2023-06-24: qty 60, 30d supply, fill #0
  Filled 2023-07-24: qty 60, 30d supply, fill #1
  Filled 2023-08-26: qty 60, 30d supply, fill #2
  Filled 2023-09-24: qty 60, 30d supply, fill #3

## 2023-06-24 NOTE — Patient Instructions (Signed)
 Stop aspirin  Start Eliquis 5mg  twice a day

## 2023-06-24 NOTE — Progress Notes (Signed)
 Primary Care Physician: Tisovec, Kristina Pfeiffer, MD Primary Cardiologist: Magnus Schuller, MD Electrophysiologist: None     Referring Physician: Device clinic     Travis [name] is a 83 y.o. male with a history of HTN, HLD, T2DM, NSVT, CAD, mild AS, SSS s/p PPM 2018, and atrial fibrillation who presents for consultation in the [facility]. Device clinic alert on 6/2 for episode of Afib > 6 hours. Dr. Alvis Ba recommended to stop ASA and begin OAC. Patient has a CHADS2VASC score of 5.  On evaluation today, he is currently in V paced rhythm. Review of outside records show labs from 5/30 with a creatinine of 1.2. He does not have cardiac awareness. He does note fatigue. He normally runs a 5k on a regular basis and most recent race last week had to walk due to inability to jog. He admits to excess caffeine intake but has recently lowered amount and switched to decaf coffee. He has infrequent alcohol consumption. He is unsure if he snores. He is hesitant to start anticoagulation due to knowing several people that seemed to pass away months after starting it. He also notes his wife passed away fairly quickly after having had a cardioversion which makes him reluctant to try the procedure.  Today, he denies symptoms of palpitations, chest pain, shortness of breath, orthopnea, PND, lower extremity edema, dizziness, presyncope, syncope, snoring, daytime somnolence, bleeding, or neurologic sequela. The patient is tolerating medications without difficulties and is otherwise without complaint today.    Atrial Fibrillation Risk Factors:  Patient does not know if he snores.  he has a BMI of Body mass index is 21.2 kg/m.Aaron Aas Filed Weights   06/24/23 0921  Weight: 68.9 kg    Current Outpatient Medications  Medication Sig Dispense Refill   amLODipine  (NORVASC ) 5 MG tablet Take 1 tablet (5 mg total) by mouth daily. 90 tablet 3   aspirin  EC 81 MG tablet Take 81 mg by mouth daily.      atorvastatin  (LIPITOR) 40 MG tablet Take 1 tablet (40 mg total) by mouth daily. 90 tablet 3   Blood Glucose Monitoring Suppl (ONETOUCH VERIO FLEX SYSTEM) w/Device KIT Use to test blood sugars 2-3x a week     brimonidine -timolol  (COMBIGAN ) 0.2-0.5 % ophthalmic solution Place 1 drop into both eyes 2 (two) times daily. 15 mL 4   brimonidine -timolol  (COMBIGAN ) 0.2-0.5 % ophthalmic solution Place 1 drop into both eyes 2 (two) times daily. 15 mL 5   Cholecalciferol (VITAMIN D3) 2000 units capsule Take 2,000 Units by mouth daily.     Cholecalciferol 125 MCG (5000 UT) capsule Take 5,000 Units by mouth daily.     COMBIGAN  0.2-0.5 % ophthalmic solution Place 1 drop into both eyes 2 (two) times daily.      ezetimibe  (ZETIA ) 10 MG tablet Take 1 tablet (10 mg total) by mouth daily. 90 tablet 2   glucose blood (ONETOUCH VERIO) test strip Use to test blood sugars 2-3x a week E11.9     hydrochlorothiazide  (MICROZIDE ) 12.5 MG capsule Take 1 capsule (12.5 mg total) by mouth daily. 90 capsule 3   lisinopril  (ZESTRIL ) 20 MG tablet Take 1 tablet (20 mg total) by mouth daily. 90 tablet 3   metFORMIN  (GLUCOPHAGE ) 500 MG tablet Take 500 mg by mouth 2 (two) times daily with a meal.      metFORMIN  (GLUCOPHAGE ) 500 MG tablet Take 1 tablet (500 mg total) by mouth 2 (two) times daily. 180 tablet 2   No  current facility-administered medications for this encounter.    Atrial Fibrillation Management history:  Previous antiarrhythmic drugs: none Previous cardioversions: none Previous ablations: none Anticoagulation history: none   ROS- All systems are reviewed and negative except as per the HPI above.  Physical Exam: BP 124/76   Pulse 72   Ht 5' 11 (1.803 m)   Wt 68.9 kg   BMI 21.20 kg/m   GEN: Well nourished, well developed in no acute distress NECK: No JVD; No carotid bruits CARDIAC: Regular rate (V paced rhythm), no murmurs, rubs, gallops RESPIRATORY:  Clear to auscultation without rales, wheezing or  rhonchi  ABDOMEN: Soft, non-tender, non-distended EXTREMITIES:  No edema; No deformity   EKG today demonstrates  Vent. rate 72 BPM PR interval * ms QRS duration 162 ms QT/QTcB 424/464 ms P-R-T axes * -77 89 Ventricular-paced rhythm Abnormal ECG When compared with ECG of 19-Feb-2023 09:29, Electronic ventricular pacemaker has replaced Electronic atrial pacemaker  Echo 12/26/21 demonstrated  1. Left ventricular ejection fraction, by estimation, is 60 to 65%. The  left ventricle has normal function. The left ventricle has no regional  wall motion abnormalities. Left ventricular diastolic parameters are  indeterminate.   2. Right ventricular systolic function is normal. The right ventricular  size is normal. There is normal pulmonary artery systolic pressure. The  estimated right ventricular systolic pressure is 28.6 mmHg.   3. The mitral valve is normal in structure. Mild mitral valve  regurgitation. No evidence of mitral stenosis.   4. Tricuspid valve regurgitation is mild to moderate.   5. The aortic valve is normal in structure. Aortic valve regurgitation is  mild. Aortic valve sclerosis/calcification is present, without any  evidence of aortic stenosis.   6. The inferior vena cava is normal in size with greater than 50%  respiratory variability, suggesting right atrial pressure of 3 mmHg.    ASSESSMENT & PLAN CHA2DS2-VASc Score = 5  The patient's score is based upon: CHF History: 0 HTN History: 1 Diabetes History: 1 Stroke History: 0 Vascular Disease History: 1 Age Score: 2 Gender Score: 0       ASSESSMENT AND PLAN: Persistent Atrial Fibrillation (ICD10:  I48.19) The patient's CHA2DS2-VASc score is 5, indicating a 7.2% annual risk of stroke.    He is currently in V paced rhythm. Education provided about Afib. Discussion about triggers for Afib. Discussion briefly about medication treatments and cardioversion going forward if indicated. After discussion, we will  proceed with conservative observation at this time. Rhythm monitoring device recommended. He is very hesitant to proceed with cardioversion but would like to revisit topic at next visit. He defers sleep study for now but would like to revisit topic at next visit. Patient wishes to follow up in 2 months to have more time to think over cardioversion.    Secondary Hypercoagulable State (ICD10:  D68.69) The patient is at significant risk for stroke/thromboembolism based upon his CHA2DS2-VASc Score of 5.  Start Apixaban (Eliquis).  We discussed the risks vs benefits of anticoagulation. After discussion, patient wishes to begin anticoagulation and understands the risks of bleeding while being on an OAC. Stop ASA. Begin Eliquis 5 mg BID. Draw CBC in 1 month; order given.      Follow up 2 months Afib clinic.    Minnie Amber, PA-C  Afib Clinic [facility] [address] [place], [place] [address] [phone]

## 2023-07-06 ENCOUNTER — Ambulatory Visit (INDEPENDENT_AMBULATORY_CARE_PROVIDER_SITE_OTHER): Admitting: Podiatry

## 2023-07-06 ENCOUNTER — Encounter: Payer: Self-pay | Admitting: Podiatry

## 2023-07-06 DIAGNOSIS — E1149 Type 2 diabetes mellitus with other diabetic neurological complication: Secondary | ICD-10-CM

## 2023-07-06 DIAGNOSIS — L84 Corns and callosities: Secondary | ICD-10-CM | POA: Diagnosis not present

## 2023-07-06 DIAGNOSIS — M2141 Flat foot [pes planus] (acquired), right foot: Secondary | ICD-10-CM

## 2023-07-06 DIAGNOSIS — M2142 Flat foot [pes planus] (acquired), left foot: Secondary | ICD-10-CM | POA: Diagnosis not present

## 2023-07-06 DIAGNOSIS — L509 Urticaria, unspecified: Secondary | ICD-10-CM | POA: Insufficient documentation

## 2023-07-06 DIAGNOSIS — R972 Elevated prostate specific antigen [PSA]: Secondary | ICD-10-CM | POA: Insufficient documentation

## 2023-07-06 DIAGNOSIS — E1140 Type 2 diabetes mellitus with diabetic neuropathy, unspecified: Secondary | ICD-10-CM

## 2023-07-06 DIAGNOSIS — M79675 Pain in left toe(s): Secondary | ICD-10-CM | POA: Diagnosis not present

## 2023-07-06 DIAGNOSIS — E119 Type 2 diabetes mellitus without complications: Secondary | ICD-10-CM | POA: Diagnosis not present

## 2023-07-06 DIAGNOSIS — B351 Tinea unguium: Secondary | ICD-10-CM | POA: Diagnosis not present

## 2023-07-06 DIAGNOSIS — M7989 Other specified soft tissue disorders: Secondary | ICD-10-CM | POA: Insufficient documentation

## 2023-07-06 DIAGNOSIS — M79674 Pain in right toe(s): Secondary | ICD-10-CM

## 2023-07-06 DIAGNOSIS — N1831 Chronic kidney disease, stage 3a: Secondary | ICD-10-CM | POA: Insufficient documentation

## 2023-07-10 NOTE — Progress Notes (Signed)
 ANNUAL DIABETIC FOOT EXAM  Subjective: Travis [name] presents today for annual diabetic foot exam. Chief Complaint  Patient presents with   Nail Problem    Get toenails cut and calluses trimmed.   Patient confirms h/o diabetes.  Patient denies any h/o foot wounds.  Tisovec, Charlie ORN, MD is patient's PCP. LOV 06/11/2023.  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   SSS (sick sinus syndrome) (HCC) 06/10/2016   Type II diabetes mellitus (HCC)    Patient Active Problem List   Diagnosis Date Noted   Chronic kidney disease, stage 3a (HCC) 07/06/2023   Elevated PSA 07/06/2023   Swelling of right hand 07/06/2023   Urticaria 07/06/2023   Closed fracture of nasal bones 12/27/2020   Impacted cerumen of left ear 12/27/2020   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   Proteinuria 10/13/2018   Encounter for general adult medical examination without abnormal findings 10/06/2018   Glaucoma 04/12/2018   Hearing loss 04/12/2018   History of urinary stone 04/12/2018   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   Osteoarthritis 04/12/2018   Pure hypercholesterolemia 04/12/2018   Left patella fracture 12/16/2017   CAD (coronary artery disease), native coronary artery 09/14/2016   Pacemaker 06/10/2016   Paroxysmal atrial fibrillation (HCC) 06/07/2016   Sinus pause 06/07/2016   Bradycardia 06/04/2016   Groin pain 06/29/2014   Piriformis syndrome of left side 10/30/2013   Past Surgical History:  Procedure Laterality Date   APPENDECTOMY     CARDIAC CATHETERIZATION  06/10/2016   CATARACT EXTRACTION W/ INTRAOCULAR LENS IMPLANT Right    CYSTOSCOPY W/ STONE MANIPULATION     INSERT / REPLACE / REMOVE PACEMAKER  06/10/2016   dual  chamber permanent pacemaker   KNEE ARTHROSCOPY W/ MENISCECTOMY Bilateral    LAPAROSCOPIC CHOLECYSTECTOMY     LEFT HEART CATH AND CORONARY ANGIOGRAPHY N/A 06/10/2016   Procedure: Left Heart Cath and Coronary Angiography;  Surgeon: Cherrie Toribio SAUNDERS, MD;  Location: [facility];  Service: Cardiovascular;  Laterality: N/A;   LITHOTRIPSY     PACEMAKER IMPLANT N/A 06/10/2016   Procedure: Pacemaker Implant;  Surgeon: Francyne Headland, MD;  Location: [facility];  Service: Cardiovascular;  Laterality: N/A;   TONSILLECTOMY     Current Outpatient Medications on File Prior to Visit  Medication Sig Dispense Refill   amLODipine  (NORVASC ) 5 MG tablet Take 1 tablet (5 mg total) by mouth daily. 90 tablet 3   apixaban  (ELIQUIS ) 5 MG TABS tablet Take 1 tablet (5 mg total) by mouth 2 (two) times daily. 60 tablet 3   atorvastatin  (LIPITOR) 40 MG tablet Take 1 tablet (40 mg total) by mouth daily. 90 tablet 3   Blood Glucose Monitoring Suppl (ONETOUCH VERIO FLEX SYSTEM) w/Device KIT Use to test blood sugars 2-3x a week     brimonidine -timolol  (COMBIGAN ) 0.2-0.5 % ophthalmic solution Place 1 drop into both eyes 2 (two) times daily. 15 mL 4   brimonidine -timolol  (COMBIGAN ) 0.2-0.5 % ophthalmic solution Place 1 drop into both eyes 2 (two) times daily. 15 mL 5   Cholecalciferol (VITAMIN D3) 2000 units capsule Take 2,000 Units by mouth daily.     Cholecalciferol 125 MCG (5000 UT) capsule Take  5,000 Units by mouth daily.     COMBIGAN  0.2-0.5 % ophthalmic solution Place 1 drop into both eyes 2 (two) times daily.      ezetimibe  (ZETIA ) 10 MG tablet Take 1 tablet (10 mg total) by mouth daily. 90 tablet 2   glucose blood (ONETOUCH VERIO) test strip Use to test blood sugars 2-3x a week E11.9     hydrochlorothiazide  (MICROZIDE ) 12.5 MG capsule Take 1 capsule (12.5 mg total) by mouth daily. 90 capsule 3   lisinopril  (ZESTRIL ) 20 MG tablet Take 1 tablet (20 mg total) by mouth daily. 90 tablet 3   metFORMIN   (GLUCOPHAGE ) 500 MG tablet Take 500 mg by mouth 2 (two) times daily with a meal.      metFORMIN  (GLUCOPHAGE ) 500 MG tablet Take 1 tablet (500 mg total) by mouth 2 (two) times daily. 180 tablet 2   No current facility-administered medications on file prior to visit.    No Known Allergies Social History   Occupational History   Not on file  Tobacco Use   Smoking status: Former    Current packs/day: 0.00    Average packs/day: 0.5 packs/day for 25.0 years (12.5 ttl pk-yrs)    Types: Cigarettes    Start date: 62    Quit date: 74    Years since quitting: 42.5   Smokeless tobacco: Never  Vaping Use   Vaping status: Never Used  Substance and Sexual Activity   Alcohol use: Yes    Comment: 06/10/2016 might have 2 beers/month   Drug use: No   Sexual activity: Not on file   Family History  Problem Relation Age of Onset   Colon cancer Father    Immunization History  Administered Date(s) Administered   Influenza, Quadrivalent, Recombinant, Inj, Pf 12/03/2022   PFIZER Comirnaty (Gray Top)Covid-19 Tri-Sucrose Vaccine 05/23/2020   PFIZER(Purple Top)SARS-COV-2 Vaccination 01/21/2019, 02/11/2019, 11/07/2019, 10/30/2020   Pfizer(Comirnaty )Fall Seasonal Vaccine 12 years and older 12/01/2021, 01/11/2023   Respiratory Syncytial Virus Vaccine ,Recomb Aduvanted(Arexvy ) 12/10/2021   Tdap 09/21/2017, 11/28/2020     Review of Systems: Negative except as noted in the HPI.   Objective: There were no vitals filed for this visit.  Travis [name] is a pleasant 83 y.o. male in NAD. AAO X 3.  Diabetic foot exam was performed with the following findings:   Normal sensation of 10g monofilament Intact posterior tibialis and dorsalis pedis pulses Vascular Examination: Capillary refill time immediate b/l. Palpable pedal pulses. Pedal hair sparse b/l. No pain with calf compression b/l. Skin temperature gradient WNL b/l. No cyanosis or clubbing b/l. No ischemia or gangrene noted b/l.   Neurological  Examination: Pt has subjective symptoms of neuropathy. Sensation grossly intact b/l with 10 gram monofilament. Vibratory sensation intact b/l.   Dermatological Examination: Pedal skin with normal turgor, texture and tone b/l.  No open wounds. No interdigital macerations.   Toenails 1-5 b/l thick, discolored, elongated with subungual debris and pain on dorsal palpation.   Hyperkeratotic lesion(s) submet head 2 b/l and submet head 3 b/l.  No erythema, no edema, no drainage, no fluctuance.  Musculoskeletal Examination: Muscle strength 5/5 to all lower extremity muscle groups bilaterally. Pes planus deformity noted bilateral LE.SABRA No pain, crepitus or joint limitation noted with ROM b/l LE.  Patient ambulates independently without assistive aids.  Radiographs: None     Lab Results  Component Value Date   HGBA1C 7.7 (H) 12/17/2017   ADA Risk Categorization: Low Risk :  Patient has all of the following: Intact protective sensation  No prior foot ulcer  No severe deformity Pedal pulses present  Assessment: 1. Pain due to onychomycosis of toenails of both feet   2. Callus   3. Pes planus of both feet   4. Diabetic neuropathy with neurologic complication (HCC)   5. Encounter for diabetic foot exam (HCC)     Plan: Diabetic foot examination performed today. All patient's and/or POA's questions/concerns addressed on today's visit. Toenails 1-5 debrided in length and girth without incident. Callus(es) submet head 2 b/l and submet head 3 b/l pared with sharp debridement without incident. Continue daily foot inspections and monitor blood glucose per PCP/Endocrinologist's recommendations.Continue soft, supportive shoe gear daily. Report any pedal injuries to medical professional. Call office if there are any questions/concerns. -Patient/POA to call should there be question/concern in the interim. Return in about 3 months (around 10/06/2023).  Delon LITTIE Merlin, DPM      [facility]  LOCATION: [address] [place] [address]                   Office [phone]   [facility] LOCATION: [address] [place], [place] [address] Office [phone]

## 2023-07-21 ENCOUNTER — Other Ambulatory Visit (HOSPITAL_BASED_OUTPATIENT_CLINIC_OR_DEPARTMENT_OTHER): Payer: Self-pay

## 2023-07-21 MED FILL — Ezetimibe Tab 10 MG: ORAL | 90 days supply | Qty: 90 | Fill #0 | Status: AC

## 2023-07-22 ENCOUNTER — Ambulatory Visit: Payer: Medicare Other

## 2023-07-22 DIAGNOSIS — I495 Sick sinus syndrome: Secondary | ICD-10-CM

## 2023-07-23 LAB — CUP PACEART REMOTE DEVICE CHECK
Battery Remaining Longevity: 84 mo
Battery Voltage: 2.98 V
Brady Statistic RA Percent Paced: 0.42 %
Brady Statistic RV Percent Paced: 24.17 %
Date Time Interrogation Session: 20250710081357
Implantable Lead Connection Status: 753985
Implantable Lead Connection Status: 753985
Implantable Lead Implant Date: 20180530
Implantable Lead Implant Date: 20180530
Implantable Lead Location: 753859
Implantable Lead Location: 753860
Implantable Lead Model: 5076
Implantable Lead Model: 5076
Implantable Pulse Generator Implant Date: 20180530
Lead Channel Impedance Value: 304 Ohm
Lead Channel Impedance Value: 342 Ohm
Lead Channel Impedance Value: 380 Ohm
Lead Channel Impedance Value: 418 Ohm
Lead Channel Pacing Threshold Amplitude: 0.625 V
Lead Channel Pacing Threshold Amplitude: 0.625 V
Lead Channel Pacing Threshold Pulse Width: 0.4 ms
Lead Channel Pacing Threshold Pulse Width: 0.4 ms
Lead Channel Sensing Intrinsic Amplitude: 12.125 mV
Lead Channel Sensing Intrinsic Amplitude: 12.125 mV
Lead Channel Sensing Intrinsic Amplitude: 4.125 mV
Lead Channel Sensing Intrinsic Amplitude: 4.125 mV
Lead Channel Setting Pacing Amplitude: 1.5 V
Lead Channel Setting Pacing Amplitude: 2 V
Lead Channel Setting Pacing Pulse Width: 0.4 ms
Lead Channel Setting Sensing Sensitivity: 2 mV
Zone Setting Status: 755011

## 2023-07-24 ENCOUNTER — Other Ambulatory Visit (HOSPITAL_BASED_OUTPATIENT_CLINIC_OR_DEPARTMENT_OTHER): Payer: Self-pay

## 2023-07-26 ENCOUNTER — Ambulatory Visit: Payer: Self-pay | Admitting: Cardiovascular Disease

## 2023-07-27 NOTE — Progress Notes (Signed)
 Yes, I saw your note about his wife's poor outcome. I would definitely try to offer cardioversion again, gently .  But if he is truly asymptomatic, would manage conservatively.

## 2023-08-02 ENCOUNTER — Other Ambulatory Visit (HOSPITAL_BASED_OUTPATIENT_CLINIC_OR_DEPARTMENT_OTHER): Payer: Self-pay

## 2023-08-02 MED FILL — Lisinopril Tab 20 MG: ORAL | 90 days supply | Qty: 90 | Fill #0 | Status: AC

## 2023-08-16 ENCOUNTER — Other Ambulatory Visit (HOSPITAL_BASED_OUTPATIENT_CLINIC_OR_DEPARTMENT_OTHER): Payer: Self-pay

## 2023-08-16 MED FILL — Atorvastatin Calcium Tab 40 MG (Base Equivalent): ORAL | 90 days supply | Qty: 90 | Fill #0 | Status: AC

## 2023-08-17 ENCOUNTER — Other Ambulatory Visit (HOSPITAL_BASED_OUTPATIENT_CLINIC_OR_DEPARTMENT_OTHER): Payer: Self-pay

## 2023-08-26 ENCOUNTER — Other Ambulatory Visit (HOSPITAL_BASED_OUTPATIENT_CLINIC_OR_DEPARTMENT_OTHER): Payer: Self-pay

## 2023-09-01 ENCOUNTER — Encounter (HOSPITAL_COMMUNITY): Payer: Self-pay | Admitting: Internal Medicine

## 2023-09-01 ENCOUNTER — Ambulatory Visit (HOSPITAL_COMMUNITY)
Admission: RE | Admit: 2023-09-01 | Discharge: 2023-09-01 | Disposition: A | Discharge status: Home / Self Care | Source: Ambulatory Visit | Attending: Internal Medicine | Admitting: Internal Medicine

## 2023-09-01 VITALS — BP 116/76 | HR 74 | Ht 71.0 in | Wt 146.2 lb

## 2023-09-01 DIAGNOSIS — D6869 Other thrombophilia: Secondary | ICD-10-CM | POA: Insufficient documentation

## 2023-09-01 DIAGNOSIS — I4819 Other persistent atrial fibrillation: Secondary | ICD-10-CM | POA: Insufficient documentation

## 2023-09-01 DIAGNOSIS — I480 Paroxysmal atrial fibrillation: Secondary | ICD-10-CM | POA: Insufficient documentation

## 2023-09-01 NOTE — Progress Notes (Signed)
 Primary Care Physician: Tisovec, Charlie ORN, MD Primary Cardiologist: Debby Sor, MD (Inactive) Electrophysiologist: None     Referring Physician: Device clinic     Travis [name] is a 83 y.o. male with a history of HTN, HLD, T2DM, NSVT, CAD, mild AS, SSS s/p PPM 2018, and atrial fibrillation who presents for consultation in the [facility]. Device clinic alert on 6/2 for episode of Afib > 6 hours. Dr. Francyne recommended to stop ASA and begin OAC. Patient has a CHADS2VASC score of 5.  On evaluation today, he is currently in V paced rhythm. Review of outside records show labs from 5/30 with a creatinine of 1.2. He does not have cardiac awareness. He does note fatigue. He normally runs a 5k on a regular basis and most recent race last week had to walk due to inability to jog. He admits to excess caffeine intake but has recently lowered amount and switched to decaf coffee. He has infrequent alcohol consumption. He is unsure if he snores. He is hesitant to start anticoagulation due to knowing several people that seemed to pass away months after starting it. He also notes his wife passed away fairly quickly after having had a cardioversion which makes him reluctant to try the procedure.  On follow up 09/01/23, patient is currently in V paced rhythm. Recent device interrogation by Dr. Francyne showed persistent Afib since 06/14/23. Patient notes that he has thought about it and would like to proceed with cardioversion. He notes being tired and SOB with exertion. No bleeding issues on Eliquis . He notes to have occasionally missed an evening dose of Eliquis , maybe about 3 times since starting OAC.   Today, he denies symptoms of palpitations, chest pain, orthopnea, PND, lower extremity edema, dizziness, presyncope, syncope, snoring, daytime somnolence, bleeding, or neurologic sequela. The patient is tolerating medications without difficulties and is otherwise without complaint  today.    Atrial Fibrillation Risk Factors:  Patient does not know if he snores.  he has a BMI of Body mass index is 20.39 kg/m.SABRA Filed Weights   09/01/23 1055  Weight: 66.3 kg     Current Outpatient Medications  Medication Sig Dispense Refill   amLODipine  (NORVASC ) 5 MG tablet Take 1 tablet (5 mg total) by mouth daily. 90 tablet 3   apixaban  (ELIQUIS ) 5 MG TABS tablet Take 1 tablet (5 mg total) by mouth 2 (two) times daily. 60 tablet 3   atorvastatin  (LIPITOR) 40 MG tablet Take 1 tablet (40 mg total) by mouth daily. 90 tablet 3   Blood Glucose Monitoring Suppl (ONETOUCH VERIO FLEX SYSTEM) w/Device KIT Use to test blood sugars 2-3x a week     brimonidine -timolol  (COMBIGAN ) 0.2-0.5 % ophthalmic solution Place 1 drop into both eyes 2 (two) times daily. 15 mL 4   brimonidine -timolol  (COMBIGAN ) 0.2-0.5 % ophthalmic solution Place 1 drop into both eyes 2 (two) times daily. 15 mL 5   Cholecalciferol (VITAMIN D3) 2000 units capsule Take 2,000 Units by mouth daily.     Cholecalciferol 125 MCG (5000 UT) capsule Take 5,000 Units by mouth daily.     COMBIGAN  0.2-0.5 % ophthalmic solution Place 1 drop into both eyes 2 (two) times daily.      ezetimibe  (ZETIA ) 10 MG tablet Take 1 tablet (10 mg total) by mouth daily. 90 tablet 2   glucose blood (ONETOUCH VERIO) test strip Use to test blood sugars 2-3x a week E11.9     hydrochlorothiazide  (MICROZIDE ) 12.5 MG capsule Take  1 capsule (12.5 mg total) by mouth daily. 90 capsule 3   lisinopril  (ZESTRIL ) 20 MG tablet Take 1 tablet (20 mg total) by mouth daily. 90 tablet 3   metFORMIN  (GLUCOPHAGE ) 500 MG tablet Take 500 mg by mouth 2 (two) times daily with a meal.      metFORMIN  (GLUCOPHAGE ) 500 MG tablet Take 1 tablet (500 mg total) by mouth 2 (two) times daily. 180 tablet 2   No current facility-administered medications for this encounter.    Atrial Fibrillation Management history:  Previous antiarrhythmic drugs: none Previous cardioversions:  none Previous ablations: none Anticoagulation history: Eliquis    ROS- All systems are reviewed and negative except as per the HPI above.  Physical Exam: BP 116/76   Pulse 74   Ht 5' 11 (1.803 m)   Wt 66.3 kg   BMI 20.39 kg/m   GEN- The patient is well appearing, alert and oriented x 3 today.   Neck - no JVD or carotid bruit noted Lungs- Clear to ausculation bilaterally, normal work of breathing Heart- Regular rate (V paced), no murmurs, rubs or gallops, PMI not laterally displaced Extremities- no clubbing, cyanosis, or edema Skin - no rash or ecchymosis noted   EKG today demonstrates  Vent. rate 74 BPM PR interval * ms QRS duration 166 ms QT/QTcB 430/477 ms P-R-T axes * -70 87 Ventricular-paced rhythm Abnormal ECG When compared with ECG of 24-Jun-2023 09:33, No significant change was found Confirmed by Terra Pac ([id number]) on 09/01/2023 11:18:11 AM  Echo 12/26/21 demonstrated  1. Left ventricular ejection fraction, by estimation, is 60 to 65%. The  left ventricle has normal function. The left ventricle has no regional  wall motion abnormalities. Left ventricular diastolic parameters are  indeterminate.   2. Right ventricular systolic function is normal. The right ventricular  size is normal. There is normal pulmonary artery systolic pressure. The  estimated right ventricular systolic pressure is 28.6 mmHg.   3. The mitral valve is normal in structure. Mild mitral valve  regurgitation. No evidence of mitral stenosis.   4. Tricuspid valve regurgitation is mild to moderate.   5. The aortic valve is normal in structure. Aortic valve regurgitation is  mild. Aortic valve sclerosis/calcification is present, without any  evidence of aortic stenosis.   6. The inferior vena cava is normal in size with greater than 50%  respiratory variability, suggesting right atrial pressure of 3 mmHg.    ASSESSMENT & PLAN CHA2DS2-VASc Score = 5  The patient's score is based upon: CHF  History: 0 HTN History: 1 Diabetes History: 1 Stroke History: 0 Vascular Disease History: 1 Age Score: 2 Gender Score: 0       ASSESSMENT AND PLAN: Persistent Atrial Fibrillation (ICD10:  I48.19) / flutter The patient's CHA2DS2-VASc score is 5, indicating a 7.2% annual risk of stroke.    He is currently in V paced rhythm. Patient would like to proceed with cardioversion and would specifically like to do procedure with Dr. Francyne his primary cardiologist. We discussed the procedure cardioversion to try to convert to NSR. We discussed the risks vs benefits of this procedure and how ultimately we cannot predict whether a patient will have early return of arrhythmia post procedure. After discussion, the patient wishes to proceed with cardioversion. Labs drawn today.    Informed Consent   Shared Decision Making/Informed Consent The risks (stroke, cardiac arrhythmias rarely resulting in the need for a temporary or permanent pacemaker, skin irritation or burns and complications associated with conscious sedation including  aspiration, arrhythmia, respiratory failure and death), benefits (restoration of normal sinus rhythm) and alternatives of a direct current cardioversion were explained in detail to Mr. Tullis and he agrees to proceed.       Secondary Hypercoagulable State (ICD10:  D68.69) The patient is at significant risk for stroke/thromboembolism based upon his CHA2DS2-VASc Score of 5.  No bleeding issues on Apixaban  (Eliquis ).  Continue Eliquis  5 mg BID. Emphasis placed on adherence to medication and to not miss any doses. I specifically mentioned if he were to miss a dose leading up to the procedure it would likely be canceled. He understands and will be more diligent about it.    Follow up 2 weeks after DCCV.   Terra Pac, PA-C  [facility] [address] [place], [place] [address] [phone]

## 2023-09-01 NOTE — Patient Instructions (Addendum)
 Hold A.M  dose of metformin  day of DCCV  Cardioversion scheduled for: 10/01/23 Friday 6:30 am   - Arrive at the [facility] ([address]  and check in with ADMITTING at 6:30am    - Do not eat or drink anything after midnight the night prior to your procedure.   - Take all your morning medication (except diabetic medications) with a sip of water prior to arrival.  - Do NOT miss any doses of your blood thinner - if you should miss a dose or take a dose more than 4 hours late -- please notify our office immediately.  - You will not be able to drive home after your procedure. Please ensure you have a responsible adult to drive you home. You will need someone with you for 24 hours post procedure.     - Expect to be in the procedural area approximately 2 hours.   - If you feel as if you go back into normal rhythm prior to scheduled cardioversion, please notify our office immediately.   If your procedure is canceled in the cardioversion suite you will be charged a cancellation fee.

## 2023-09-01 NOTE — H&P (View-Only) (Signed)
 Primary Care Physician: Tisovec, Charlie ORN, MD Primary Cardiologist: Debby Sor, MD (Inactive) Electrophysiologist: None     Referring Physician: Device clinic     Travis [name] is a 83 y.o. male with a history of HTN, HLD, T2DM, NSVT, CAD, mild AS, SSS s/p PPM 2018, and atrial fibrillation who presents for consultation in the [facility]. Device clinic alert on 6/2 for episode of Afib > 6 hours. Dr. Francyne recommended to stop ASA and begin OAC. Patient has a CHADS2VASC score of 5.  On evaluation today, he is currently in V paced rhythm. Review of outside records show labs from 5/30 with a creatinine of 1.2. He does not have cardiac awareness. He does note fatigue. He normally runs a 5k on a regular basis and most recent race last week had to walk due to inability to jog. He admits to excess caffeine intake but has recently lowered amount and switched to decaf coffee. He has infrequent alcohol consumption. He is unsure if he snores. He is hesitant to start anticoagulation due to knowing several people that seemed to pass away months after starting it. He also notes his wife passed away fairly quickly after having had a cardioversion which makes him reluctant to try the procedure.  On follow up 09/01/23, patient is currently in V paced rhythm. Recent device interrogation by Dr. Francyne showed persistent Afib since 06/14/23. Patient notes that he has thought about it and would like to proceed with cardioversion. He notes being tired and SOB with exertion. No bleeding issues on Eliquis . He notes to have occasionally missed an evening dose of Eliquis , maybe about 3 times since starting OAC.   Today, he denies symptoms of palpitations, chest pain, orthopnea, PND, lower extremity edema, dizziness, presyncope, syncope, snoring, daytime somnolence, bleeding, or neurologic sequela. The patient is tolerating medications without difficulties and is otherwise without complaint  today.    Atrial Fibrillation Risk Factors:  Patient does not know if he snores.  he has a BMI of Body mass index is 20.39 kg/m.SABRA Filed Weights   09/01/23 1055  Weight: 66.3 kg     Current Outpatient Medications  Medication Sig Dispense Refill   amLODipine  (NORVASC ) 5 MG tablet Take 1 tablet (5 mg total) by mouth daily. 90 tablet 3   apixaban  (ELIQUIS ) 5 MG TABS tablet Take 1 tablet (5 mg total) by mouth 2 (two) times daily. 60 tablet 3   atorvastatin  (LIPITOR) 40 MG tablet Take 1 tablet (40 mg total) by mouth daily. 90 tablet 3   Blood Glucose Monitoring Suppl (ONETOUCH VERIO FLEX SYSTEM) w/Device KIT Use to test blood sugars 2-3x a week     brimonidine -timolol  (COMBIGAN ) 0.2-0.5 % ophthalmic solution Place 1 drop into both eyes 2 (two) times daily. 15 mL 4   brimonidine -timolol  (COMBIGAN ) 0.2-0.5 % ophthalmic solution Place 1 drop into both eyes 2 (two) times daily. 15 mL 5   Cholecalciferol (VITAMIN D3) 2000 units capsule Take 2,000 Units by mouth daily.     Cholecalciferol 125 MCG (5000 UT) capsule Take 5,000 Units by mouth daily.     COMBIGAN  0.2-0.5 % ophthalmic solution Place 1 drop into both eyes 2 (two) times daily.      ezetimibe  (ZETIA ) 10 MG tablet Take 1 tablet (10 mg total) by mouth daily. 90 tablet 2   glucose blood (ONETOUCH VERIO) test strip Use to test blood sugars 2-3x a week E11.9     hydrochlorothiazide  (MICROZIDE ) 12.5 MG capsule Take  1 capsule (12.5 mg total) by mouth daily. 90 capsule 3   lisinopril  (ZESTRIL ) 20 MG tablet Take 1 tablet (20 mg total) by mouth daily. 90 tablet 3   metFORMIN  (GLUCOPHAGE ) 500 MG tablet Take 500 mg by mouth 2 (two) times daily with a meal.      metFORMIN  (GLUCOPHAGE ) 500 MG tablet Take 1 tablet (500 mg total) by mouth 2 (two) times daily. 180 tablet 2   No current facility-administered medications for this encounter.    Atrial Fibrillation Management history:  Previous antiarrhythmic drugs: none Previous cardioversions:  none Previous ablations: none Anticoagulation history: Eliquis    ROS- All systems are reviewed and negative except as per the HPI above.  Physical Exam: BP 116/76   Pulse 74   Ht 5' 11 (1.803 m)   Wt 66.3 kg   BMI 20.39 kg/m   GEN- The patient is well appearing, alert and oriented x 3 today.   Neck - no JVD or carotid bruit noted Lungs- Clear to ausculation bilaterally, normal work of breathing Heart- Regular rate (V paced), no murmurs, rubs or gallops, PMI not laterally displaced Extremities- no clubbing, cyanosis, or edema Skin - no rash or ecchymosis noted   EKG today demonstrates  Vent. rate 74 BPM PR interval * ms QRS duration 166 ms QT/QTcB 430/477 ms P-R-T axes * -70 87 Ventricular-paced rhythm Abnormal ECG When compared with ECG of 24-Jun-2023 09:33, No significant change was found Confirmed by Terra Pac ([id number]) on 09/01/2023 11:18:11 AM  Echo 12/26/21 demonstrated  1. Left ventricular ejection fraction, by estimation, is 60 to 65%. The  left ventricle has normal function. The left ventricle has no regional  wall motion abnormalities. Left ventricular diastolic parameters are  indeterminate.   2. Right ventricular systolic function is normal. The right ventricular  size is normal. There is normal pulmonary artery systolic pressure. The  estimated right ventricular systolic pressure is 28.6 mmHg.   3. The mitral valve is normal in structure. Mild mitral valve  regurgitation. No evidence of mitral stenosis.   4. Tricuspid valve regurgitation is mild to moderate.   5. The aortic valve is normal in structure. Aortic valve regurgitation is  mild. Aortic valve sclerosis/calcification is present, without any  evidence of aortic stenosis.   6. The inferior vena cava is normal in size with greater than 50%  respiratory variability, suggesting right atrial pressure of 3 mmHg.    ASSESSMENT & PLAN CHA2DS2-VASc Score = 5  The patient's score is based upon: CHF  History: 0 HTN History: 1 Diabetes History: 1 Stroke History: 0 Vascular Disease History: 1 Age Score: 2 Gender Score: 0       ASSESSMENT AND PLAN: Persistent Atrial Fibrillation (ICD10:  I48.19) / flutter The patient's CHA2DS2-VASc score is 5, indicating a 7.2% annual risk of stroke.    He is currently in V paced rhythm. Patient would like to proceed with cardioversion and would specifically like to do procedure with Dr. Francyne his primary cardiologist. We discussed the procedure cardioversion to try to convert to NSR. We discussed the risks vs benefits of this procedure and how ultimately we cannot predict whether a patient will have early return of arrhythmia post procedure. After discussion, the patient wishes to proceed with cardioversion. Labs drawn today.    Informed Consent   Shared Decision Making/Informed Consent The risks (stroke, cardiac arrhythmias rarely resulting in the need for a temporary or permanent pacemaker, skin irritation or burns and complications associated with conscious sedation including  aspiration, arrhythmia, respiratory failure and death), benefits (restoration of normal sinus rhythm) and alternatives of a direct current cardioversion were explained in detail to Mr. Tullis and he agrees to proceed.       Secondary Hypercoagulable State (ICD10:  D68.69) The patient is at significant risk for stroke/thromboembolism based upon his CHA2DS2-VASc Score of 5.  No bleeding issues on Apixaban  (Eliquis ).  Continue Eliquis  5 mg BID. Emphasis placed on adherence to medication and to not miss any doses. I specifically mentioned if he were to miss a dose leading up to the procedure it would likely be canceled. He understands and will be more diligent about it.    Follow up 2 weeks after DCCV.   Terra Pac, PA-C  Afib Clinic [facility] [address] [place], [place] [address] [phone]

## 2023-09-02 ENCOUNTER — Ambulatory Visit (HOSPITAL_COMMUNITY): Payer: Self-pay | Admitting: Internal Medicine

## 2023-09-02 LAB — BASIC METABOLIC PANEL WITH GFR
BUN/Creatinine Ratio: 16 (ref 10–24)
BUN: 25 mg/dL (ref 8–27)
CO2: 19 mmol/L — ABNORMAL LOW (ref 20–29)
Calcium: 10.3 mg/dL — ABNORMAL HIGH (ref 8.6–10.2)
Chloride: 104 mmol/L (ref 96–106)
Creatinine, Ser: 1.52 mg/dL — ABNORMAL HIGH (ref 0.76–1.27)
Glucose: 97 mg/dL (ref 70–99)
Potassium: 4.6 mmol/L (ref 3.5–5.2)
Sodium: 139 mmol/L (ref 134–144)
eGFR: 45 mL/min/{1.73_m2} — ABNORMAL LOW

## 2023-09-02 LAB — CBC
Hematocrit: 40.8 % (ref 37.5–51.0)
Hemoglobin: 13.5 g/dL (ref 13.0–17.7)
MCH: 33 pg (ref 26.6–33.0)
MCHC: 33.1 g/dL (ref 31.5–35.7)
MCV: 100 fL — ABNORMAL HIGH (ref 79–97)
Platelets: 266 10*3/uL (ref 150–450)
RBC: 4.09 x10E6/uL — ABNORMAL LOW (ref 4.14–5.80)
RDW: 12.4 % (ref 11.6–15.4)
WBC: 8.9 10*3/uL (ref 3.4–10.8)

## 2023-09-07 ENCOUNTER — Other Ambulatory Visit (HOSPITAL_BASED_OUTPATIENT_CLINIC_OR_DEPARTMENT_OTHER): Payer: Self-pay

## 2023-09-07 MED FILL — Hydrochlorothiazide Cap 12.5 MG: ORAL | 30 days supply | Qty: 30 | Fill #0 | Status: AC

## 2023-09-07 MED FILL — Hydrochlorothiazide Cap 12.5 MG: ORAL | 60 days supply | Qty: 60 | Fill #0 | Status: AC

## 2023-09-14 ENCOUNTER — Other Ambulatory Visit: Payer: Self-pay

## 2023-09-14 ENCOUNTER — Other Ambulatory Visit (HOSPITAL_BASED_OUTPATIENT_CLINIC_OR_DEPARTMENT_OTHER): Payer: Self-pay

## 2023-09-24 ENCOUNTER — Other Ambulatory Visit (HOSPITAL_BASED_OUTPATIENT_CLINIC_OR_DEPARTMENT_OTHER): Payer: Self-pay

## 2023-09-25 ENCOUNTER — Other Ambulatory Visit (HOSPITAL_BASED_OUTPATIENT_CLINIC_OR_DEPARTMENT_OTHER): Payer: Self-pay

## 2023-09-30 NOTE — Progress Notes (Signed)
 Spoke to patient and instructed them to come at 0630  and to be NPO after 0000.     Confirmed that patient will have a ride home and someone to stay with them for 24 hours after the procedure.   Medications reviewed.  Confirmed blood thinner.  Confirmed no breaks in taking blood thinner for 3+ weeks prior to procedure.

## 2023-10-01 ENCOUNTER — Ambulatory Visit (HOSPITAL_COMMUNITY): Admitting: Registered Nurse

## 2023-10-01 ENCOUNTER — Encounter (HOSPITAL_COMMUNITY)
Admission: RE | Disposition: A | Discharge status: Home / Self Care | Payer: Self-pay | Source: Home / Self Care | Attending: Cardiovascular Disease

## 2023-10-01 ENCOUNTER — Ambulatory Visit (HOSPITAL_COMMUNITY)
Admission: RE | Admit: 2023-10-01 | Discharge: 2023-10-01 | Disposition: A | Discharge status: Home / Self Care | Attending: Cardiovascular Disease | Admitting: Cardiovascular Disease

## 2023-10-01 ENCOUNTER — Other Ambulatory Visit: Payer: Self-pay

## 2023-10-01 DIAGNOSIS — I4891 Unspecified atrial fibrillation: Secondary | ICD-10-CM

## 2023-10-01 DIAGNOSIS — D6869 Other thrombophilia: Secondary | ICD-10-CM | POA: Insufficient documentation

## 2023-10-01 DIAGNOSIS — Z87891 Personal history of nicotine dependence: Secondary | ICD-10-CM | POA: Diagnosis not present

## 2023-10-01 DIAGNOSIS — Z7901 Long term (current) use of anticoagulants: Secondary | ICD-10-CM | POA: Diagnosis not present

## 2023-10-01 DIAGNOSIS — E119 Type 2 diabetes mellitus without complications: Secondary | ICD-10-CM | POA: Insufficient documentation

## 2023-10-01 DIAGNOSIS — Z950 Presence of cardiac pacemaker: Secondary | ICD-10-CM | POA: Diagnosis not present

## 2023-10-01 DIAGNOSIS — E785 Hyperlipidemia, unspecified: Secondary | ICD-10-CM | POA: Diagnosis not present

## 2023-10-01 DIAGNOSIS — I4819 Other persistent atrial fibrillation: Secondary | ICD-10-CM | POA: Insufficient documentation

## 2023-10-01 DIAGNOSIS — I4892 Unspecified atrial flutter: Secondary | ICD-10-CM | POA: Insufficient documentation

## 2023-10-01 DIAGNOSIS — I2510 Atherosclerotic heart disease of native coronary artery without angina pectoris: Secondary | ICD-10-CM | POA: Diagnosis not present

## 2023-10-01 DIAGNOSIS — Z7984 Long term (current) use of oral hypoglycemic drugs: Secondary | ICD-10-CM | POA: Diagnosis not present

## 2023-10-01 DIAGNOSIS — I472 Ventricular tachycardia, unspecified: Secondary | ICD-10-CM | POA: Diagnosis not present

## 2023-10-01 DIAGNOSIS — I495 Sick sinus syndrome: Secondary | ICD-10-CM | POA: Diagnosis not present

## 2023-10-01 DIAGNOSIS — Z79899 Other long term (current) drug therapy: Secondary | ICD-10-CM | POA: Insufficient documentation

## 2023-10-01 DIAGNOSIS — I10 Essential (primary) hypertension: Secondary | ICD-10-CM | POA: Diagnosis not present

## 2023-10-01 DIAGNOSIS — I350 Nonrheumatic aortic (valve) stenosis: Secondary | ICD-10-CM | POA: Insufficient documentation

## 2023-10-01 HISTORY — PX: CARDIOVERSION: EP1203

## 2023-10-01 LAB — GLUCOSE, CAPILLARY: Glucose-Capillary: 106 mg/dL — ABNORMAL HIGH (ref 70–99)

## 2023-10-01 SURGERY — CARDIOVERSION (CATH LAB)
Anesthesia: General

## 2023-10-01 MED ORDER — SODIUM CHLORIDE 0.9 % IV SOLN: INTRAVENOUS | Status: DC | PRN

## 2023-10-01 MED ORDER — LIDOCAINE 2% (20 MG/ML) 5 ML SYRINGE
INTRAMUSCULAR | Status: DC | PRN
  Administered 2023-10-01: 100 mg via INTRAVENOUS

## 2023-10-01 MED ORDER — PROPOFOL 10 MG/ML IV BOLUS
INTRAVENOUS | Status: DC | PRN
  Administered 2023-10-01: 40 mg via INTRAVENOUS

## 2023-10-01 MED ORDER — SODIUM CHLORIDE 0.9 % IV SOLN: INTRAVENOUS | Status: DC

## 2023-10-01 SURGICAL SUPPLY — 1 items: PAD DEFIB RADIO PHYSIO CONN (PAD) ×1 IMPLANT

## 2023-10-01 NOTE — Interval H&P Note (Signed)
 History and Physical Interval Note:  10/01/2023 8:01 AM  Travis [name]  has presented today for surgery, with the diagnosis of AFIB.  The various methods of treatment have been discussed with the patient and family. After consideration of risks, benefits and other options for treatment, the patient has consented to  Procedure(s): CARDIOVERSION (N/A) as a surgical intervention.  The patient's history has been reviewed, patient examined, no change in status, stable for surgery.  I have reviewed the patient's chart and labs.  Questions were answered to the patient's satisfaction.     [name]

## 2023-10-01 NOTE — Transfer of Care (Signed)
 Immediate Anesthesia Transfer of Care Note  Patient: Travis [name]  Procedure(s) Performed: CARDIOVERSION  Patient Location: Cath Lab  Anesthesia Type:General  Level of Consciousness: drowsy and responds to stimulation  Airway & Oxygen Therapy: Patient Spontanous Breathing  Post-op Assessment: Report given to RN and Post -op Vital signs reviewed and stable  Post vital signs: Reviewed and stable  Last Vitals:  Vitals Value Taken Time  BP 102/59   Temp    Pulse 64 10/01/23 07:54  Resp 22 10/01/23 07:54  SpO2 95 % 10/01/23 07:54  Vitals shown include unfiled device data.  Last Pain:  Vitals:   10/01/23 0711  TempSrc: Temporal  PainSc: 0-No pain         Complications: No notable events documented.

## 2023-10-01 NOTE — Anesthesia Preprocedure Evaluation (Addendum)
 Anesthesia Evaluation  Patient identified by MRN, date of birth, ID band Patient awake    Reviewed: Allergy & Precautions, NPO status , Patient's Chart, lab work & pertinent test results, reviewed documented beta blocker date and time   History of Anesthesia Complications Negative for: history of anesthetic complications  Airway Mallampati: II  TM Distance: >3 FB     Dental no notable dental hx.    Pulmonary former smoker   breath sounds clear to auscultation       Cardiovascular hypertension, + CAD  + pacemaker + Valvular Problems/Murmurs  Rhythm:Regular Rate:Normal  IMPRESSIONS     1. Left ventricular ejection fraction, by visual estimation, is 55 to  60%. The left ventricle has normal function. There is moderately increased  left ventricular hypertrophy.   2. Abnormal septal motion consistent with RV pacemaker.   3. Left ventricular diastolic parameters are consistent with Grade I  diastolic dysfunction (impaired relaxation).   4. The left ventricle demonstrates regional wall motion abnormalities.   5. Global right ventricle has normal systolic function.The right  ventricular size is mildly enlarged. No increase in right ventricular wall  thickness.   6. Left atrial size was moderately dilated.   7. Right atrial size was mildly dilated.   8. The mitral valve is grossly normal. Trivial mitral valve  regurgitation.   9. The tricuspid valve is grossly normal.  10. The aortic valve is tricuspid. Aortic valve regurgitation is not  visualized. Mild to moderate aortic valve sclerosis/calcification without  any evidence of aortic stenosis.  11. The pulmonic valve was grossly normal. Pulmonic valve regurgitation is  not visualized.  12. Mildly elevated pulmonary artery systolic pressure.  13. A pacer wire is visualized.  14. The inferior vena cava is normal in size with <50% respiratory  variability, suggesting right atrial  pressure of 8 mmHg.  15. No pericardial effusion  16. No significant change from prior study.  17. A prior study was performed on 05/24/2018.     Neuro/Psych  Neuromuscular disease    GI/Hepatic   Endo/Other  diabetes, Type 2    Renal/GU CRF and ARFRenal disease     Musculoskeletal  (+) Arthritis ,    Abdominal   Peds  Hematology   Anesthesia Other Findings   Reproductive/Obstetrics                              Anesthesia Physical Anesthesia Plan  ASA: 3  Anesthesia Plan: General   Post-op Pain Management:    Induction: Intravenous  PONV Risk Score and Plan: 2 and Ondansetron   Airway Management Planned: Natural Airway and Nasal Cannula  Additional Equipment:   Intra-op Plan:   Post-operative Plan:   Informed Consent: I have reviewed the patients History and Physical, chart, labs and discussed the procedure including the risks, benefits and alternatives for the proposed anesthesia with the patient or authorized representative who has indicated his/her understanding and acceptance.     Dental advisory given  Plan Discussed with: CRNA  Anesthesia Plan Comments:          Anesthesia Quick Evaluation

## 2023-10-01 NOTE — Progress Notes (Signed)
 Discharge instructions reviewed w/patient and given to patient

## 2023-10-01 NOTE — Anesthesia Postprocedure Evaluation (Signed)
 Anesthesia Post Note  Patient: Travis [name]  Procedure(s) Performed: CARDIOVERSION     Patient location during evaluation: PACU Anesthesia Type: General Level of consciousness: awake and alert Pain management: pain level controlled Vital Signs Assessment: post-procedure vital signs reviewed and stable Respiratory status: spontaneous breathing, nonlabored ventilation, respiratory function stable and patient connected to nasal cannula oxygen Cardiovascular status: blood pressure returned to baseline and stable Postop Assessment: no apparent nausea or vomiting Anesthetic complications: no   No notable events documented.  Last Vitals:  Vitals:   10/01/23 0830 10/01/23 0840  BP: 114/65 108/68  Pulse: 69 70  Resp: 16 (!) 22  Temp:    SpO2: 98% 97%    Last Pain:  Vitals:   10/01/23 0834  TempSrc:   PainSc: 0-No pain                 Lynwood MARLA Cornea

## 2023-10-01 NOTE — Op Note (Signed)
 Procedure: Electrical Cardioversion Indications:  Atrial Fibrillation  Procedure Details:  Consent: Risks of procedure as well as the alternatives and risks of each were explained to the (patient/caregiver).  Consent for procedure obtained.  Time Out: Verified patient identification, verified procedure, site/side was marked, verified correct patient position, special equipment/implants available, medications/allergies/relevent history reviewed, required imaging and test results available.  Performed  Patient placed on cardiac monitor, pulse oximetry, supplemental oxygen as necessary.  Sedation given: propofol  IV, Dr. Keneth Pacer pads placed anterior and posterior chest.  Cardioverted 1 time(s).  Cardioversion with synchronized biphasic 200J shock.  Evaluation: Findings: Post procedure EKG shows: A paced V sensed rhythm Complications: None Patient did tolerate procedure well.  Normal pacemaker function after the procedure. Atrial preference pacing turned on  Time Spent Directly with the Patient:  30 minutes   Ravindra Baranek 10/01/2023, 8:01 AM

## 2023-10-03 ENCOUNTER — Encounter (HOSPITAL_COMMUNITY): Payer: Self-pay | Admitting: Cardiovascular Disease

## 2023-10-15 ENCOUNTER — Ambulatory Visit (HOSPITAL_COMMUNITY)
Admission: RE | Admit: 2023-10-15 | Discharge: 2023-10-15 | Disposition: A | Discharge status: Home / Self Care | Source: Ambulatory Visit | Attending: Internal Medicine | Admitting: Internal Medicine

## 2023-10-15 ENCOUNTER — Other Ambulatory Visit (HOSPITAL_BASED_OUTPATIENT_CLINIC_OR_DEPARTMENT_OTHER): Payer: Self-pay

## 2023-10-15 ENCOUNTER — Encounter (HOSPITAL_COMMUNITY): Payer: Self-pay | Admitting: Internal Medicine

## 2023-10-15 ENCOUNTER — Telehealth: Payer: Self-pay | Admitting: Pharmacy Technician

## 2023-10-15 VITALS — BP 118/74 | HR 82 | Ht 71.0 in | Wt 143.6 lb

## 2023-10-15 DIAGNOSIS — I4819 Other persistent atrial fibrillation: Secondary | ICD-10-CM | POA: Insufficient documentation

## 2023-10-15 DIAGNOSIS — D6869 Other thrombophilia: Secondary | ICD-10-CM | POA: Diagnosis present

## 2023-10-15 MED ORDER — APIXABAN 5 MG PO TABS
5.0000 mg | ORAL_TABLET | Freq: Two times a day (BID) | ORAL | 6 refills | Status: DC
  Filled 2023-10-15 – 2023-10-16 (×2): qty 60, 30d supply, fill #0

## 2023-10-15 NOTE — Telephone Encounter (Signed)
 PAP: Patient assistance application for Eliquis  through [employer] ([employer] has been mailed to pt's home address on file.

## 2023-10-15 NOTE — Progress Notes (Signed)
 Primary Care Physician: Tisovec, Charlie ORN, MD Primary Cardiologist: Debby Sor, MD (Inactive) Electrophysiologist: None     Referring Physician: Device clinic     Travis [name] is a 83 y.o. male with a history of HTN, HLD, T2DM, NSVT, CAD, mild AS, SSS s/p PPM 2018, and atrial fibrillation who presents for consultation in the [facility]. Device clinic alert on 6/2 for episode of Afib > 6 hours. Dr. Francyne recommended to stop ASA and begin OAC. Patient has a CHADS2VASC score of 5.  On evaluation today, he is currently in V paced rhythm. Review of outside records show labs from 5/30 with a creatinine of 1.2. He does not have cardiac awareness. He does note fatigue. He normally runs a 5k on a regular basis and most recent race last week had to walk due to inability to jog. He admits to excess caffeine intake but has recently lowered amount and switched to decaf coffee. He has infrequent alcohol consumption. He is unsure if he snores. He is hesitant to start anticoagulation due to knowing several people that seemed to pass away months after starting it. He also notes his wife passed away fairly quickly after having had a cardioversion which makes him reluctant to try the procedure.  On follow up 09/01/23, patient is currently in V paced rhythm. Recent device interrogation by Dr. Francyne showed persistent Afib since 06/14/23. Patient notes that he has thought about it and would like to proceed with cardioversion. He notes being tired and SOB with exertion. No bleeding issues on Eliquis . He notes to have occasionally missed an evening dose of Eliquis , maybe about 3 times since starting OAC.   On follow up 10/15/23, patient is currently in A paced rhythm. S/p DCCV on 10/01/23. He feels better in normal rhythm. No missed doses of Eliquis .   Today, he denies symptoms of palpitations, chest pain, orthopnea, PND, lower extremity edema, dizziness, presyncope, syncope, snoring,  daytime somnolence, bleeding, or neurologic sequela. The patient is tolerating medications without difficulties and is otherwise without complaint today.    Atrial Fibrillation Risk Factors:  Patient does not know if he snores.  he has a BMI of Body mass index is 20.03 kg/m.SABRA Filed Weights   10/15/23 1115  Weight: 65.1 kg     Current Outpatient Medications  Medication Sig Dispense Refill   amLODipine  (NORVASC ) 5 MG tablet Take 1 tablet (5 mg total) by mouth daily. 90 tablet 3   atorvastatin  (LIPITOR) 40 MG tablet Take 1 tablet (40 mg total) by mouth daily. 90 tablet 3   Blood Glucose Monitoring Suppl (ONETOUCH VERIO FLEX SYSTEM) w/Device KIT Use to test blood sugars 2-3x a week     brimonidine -timolol  (COMBIGAN ) 0.2-0.5 % ophthalmic solution Place 1 drop into both eyes 2 (two) times daily. 15 mL 5   Cholecalciferol (VITAMIN D3) 2000 units capsule Take 2,000 Units by mouth daily.     ezetimibe  (ZETIA ) 10 MG tablet Take 1 tablet (10 mg total) by mouth daily. 90 tablet 2   glucose blood (ONETOUCH VERIO) test strip Use to test blood sugars 2-3x a week E11.9     hydrochlorothiazide  (MICROZIDE ) 12.5 MG capsule Take 1 capsule (12.5 mg total) by mouth daily. 90 capsule 3   lisinopril  (ZESTRIL ) 20 MG tablet Take 1 tablet (20 mg total) by mouth daily. 90 tablet 3   metFORMIN  (GLUCOPHAGE ) 500 MG tablet Take 1 tablet (500 mg total) by mouth 2 (two) times daily. 180 tablet 2  apixaban  (ELIQUIS ) 5 MG TABS tablet Take 1 tablet (5 mg total) by mouth 2 (two) times daily. 60 tablet 6   No current facility-administered medications for this encounter.    Atrial Fibrillation Management history:  Previous antiarrhythmic drugs: none Previous cardioversions: 10/01/23 Previous ablations: none Anticoagulation history: Eliquis    ROS- All systems are reviewed and negative except as per the HPI above.  Physical Exam: BP 118/74   Pulse 82   Ht 5' 11 (1.803 m)   Wt 65.1 kg   BMI 20.03 kg/m   GEN-  The patient is well appearing, alert and oriented x 3 today.   Neck - no JVD or carotid bruit noted Lungs- Clear to ausculation bilaterally, normal work of breathing Heart- Regular rate and rhythm, no murmurs, rubs or gallops, PMI not laterally displaced Extremities- no clubbing, cyanosis, or edema Skin - no rash or ecchymosis noted  EKG today demonstrates  Vent. rate 82 BPM PR interval 286 ms QRS duration 86 ms QT/QTcB 334/390 ms P-R-T axes 83 34 50 Atrial-paced rhythm with prolonged AV conduction Nonspecific T wave abnormality Abnormal ECG When compared with ECG of 01-Oct-2023 08:07, Previous ECG is present  Echo 12/26/21 demonstrated  1. Left ventricular ejection fraction, by estimation, is 60 to 65%. The  left ventricle has normal function. The left ventricle has no regional  wall motion abnormalities. Left ventricular diastolic parameters are  indeterminate.   2. Right ventricular systolic function is normal. The right ventricular  size is normal. There is normal pulmonary artery systolic pressure. The  estimated right ventricular systolic pressure is 28.6 mmHg.   3. The mitral valve is normal in structure. Mild mitral valve  regurgitation. No evidence of mitral stenosis.   4. Tricuspid valve regurgitation is mild to moderate.   5. The aortic valve is normal in structure. Aortic valve regurgitation is  mild. Aortic valve sclerosis/calcification is present, without any  evidence of aortic stenosis.   6. The inferior vena cava is normal in size with greater than 50%  respiratory variability, suggesting right atrial pressure of 3 mmHg.    ASSESSMENT & PLAN CHA2DS2-VASc Score = 5  The patient's score is based upon: CHF History: 0 HTN History: 1 Diabetes History: 1 Stroke History: 0 Vascular Disease History: 1 Age Score: 2 Gender Score: 0       ASSESSMENT AND PLAN: Persistent Atrial Fibrillation (ICD10:  I48.19) / flutter The patient's CHA2DS2-VASc score is 5,  indicating a 7.2% annual risk of stroke.   S/p Afib ablation on 10/01/23.  Patient is currently in A paced rhythm. Will recheck Bmet today to reassess creatinine level.   Secondary Hypercoagulable State (ICD10:  D68.69) The patient is at significant risk for stroke/thromboembolism based upon his CHA2DS2-VASc Score of 5.  No bleeding issues on Apixaban  (Eliquis ).  Continue Eliquis  without interruption.   Follow up 6 months Afib clinic.    Terra Pac, PA-C  Afib Clinic [facility] [address] [place], [place] [address] [phone]

## 2023-10-16 ENCOUNTER — Other Ambulatory Visit (HOSPITAL_BASED_OUTPATIENT_CLINIC_OR_DEPARTMENT_OTHER): Payer: Self-pay

## 2023-10-16 LAB — BASIC METABOLIC PANEL WITH GFR
BUN/Creatinine Ratio: 17 (ref 10–24)
BUN: 23 mg/dL (ref 8–27)
CO2: 22 mmol/L (ref 20–29)
Calcium: 10.2 mg/dL (ref 8.6–10.2)
Chloride: 102 mmol/L (ref 96–106)
Creatinine, Ser: 1.38 mg/dL — ABNORMAL HIGH (ref 0.76–1.27)
Glucose: 124 mg/dL — ABNORMAL HIGH (ref 70–99)
Potassium: 4.6 mmol/L (ref 3.5–5.2)
Sodium: 139 mmol/L (ref 134–144)
eGFR: 50 mL/min/1.73 — ABNORMAL LOW (ref >59)

## 2023-10-18 ENCOUNTER — Ambulatory Visit (HOSPITAL_COMMUNITY): Payer: Self-pay | Admitting: Internal Medicine

## 2023-10-18 MED FILL — Ezetimibe Tab 10 MG: ORAL | 90 days supply | Qty: 90 | Fill #1 | Status: AC

## 2023-10-19 ENCOUNTER — Encounter: Payer: Self-pay | Admitting: Podiatry

## 2023-10-19 ENCOUNTER — Ambulatory Visit: Admitting: Podiatry

## 2023-10-19 DIAGNOSIS — E1140 Type 2 diabetes mellitus with diabetic neuropathy, unspecified: Secondary | ICD-10-CM | POA: Diagnosis not present

## 2023-10-19 DIAGNOSIS — M79674 Pain in right toe(s): Secondary | ICD-10-CM | POA: Diagnosis not present

## 2023-10-19 DIAGNOSIS — B351 Tinea unguium: Secondary | ICD-10-CM

## 2023-10-19 DIAGNOSIS — E1149 Type 2 diabetes mellitus with other diabetic neurological complication: Secondary | ICD-10-CM

## 2023-10-19 DIAGNOSIS — L84 Corns and callosities: Secondary | ICD-10-CM

## 2023-10-19 DIAGNOSIS — M79675 Pain in left toe(s): Secondary | ICD-10-CM | POA: Diagnosis not present

## 2023-10-22 ENCOUNTER — Ambulatory Visit: Payer: Medicare Other

## 2023-10-22 DIAGNOSIS — I495 Sick sinus syndrome: Secondary | ICD-10-CM | POA: Diagnosis not present

## 2023-10-22 LAB — CUP PACEART REMOTE DEVICE CHECK
Battery Remaining Longevity: 77 mo
Battery Voltage: 2.97 V
Brady Statistic AP VP Percent: 0.17 %
Brady Statistic AP VS Percent: 97.15 %
Brady Statistic AS VP Percent: 0.01 %
Brady Statistic AS VS Percent: 2.67 %
Brady Statistic RA Percent Paced: 97.26 %
Brady Statistic RV Percent Paced: 0.19 %
Date Time Interrogation Session: 20251010051204
Implantable Lead Connection Status: 753985
Implantable Lead Connection Status: 753985
Implantable Lead Implant Date: 20180530
Implantable Lead Implant Date: 20180530
Implantable Lead Location: 753859
Implantable Lead Location: 753860
Implantable Lead Model: 5076
Implantable Lead Model: 5076
Implantable Pulse Generator Implant Date: 20180530
Lead Channel Impedance Value: 285 Ohm
Lead Channel Impedance Value: 323 Ohm
Lead Channel Impedance Value: 342 Ohm
Lead Channel Impedance Value: 399 Ohm
Lead Channel Pacing Threshold Amplitude: 0.625 V
Lead Channel Pacing Threshold Amplitude: 0.625 V
Lead Channel Pacing Threshold Pulse Width: 0.4 ms
Lead Channel Pacing Threshold Pulse Width: 0.4 ms
Lead Channel Sensing Intrinsic Amplitude: 10.75 mV
Lead Channel Sensing Intrinsic Amplitude: 10.75 mV
Lead Channel Sensing Intrinsic Amplitude: 2.875 mV
Lead Channel Sensing Intrinsic Amplitude: 2.875 mV
Lead Channel Setting Pacing Amplitude: 1.5 V
Lead Channel Setting Pacing Amplitude: 2 V
Lead Channel Setting Pacing Pulse Width: 0.4 ms
Lead Channel Setting Sensing Sensitivity: 2 mV
Zone Setting Status: 755011

## 2023-10-22 NOTE — Progress Notes (Signed)
 Remote PPM Transmission

## 2023-10-24 NOTE — Progress Notes (Signed)
  Subjective:  Patient ID: Travis [name], male    DOB: 08-13-1940,  MRN: [id number]  Travis [name] presents to clinic today for at risk foot care with history of diabetic neuropathy and callus(es) of both feet and painful mycotic toenails that are difficult to trim. Painful toenails interfere with ambulation. Aggravating factors include wearing enclosed shoe gear. Pain is relieved with periodic professional debridement. Painful calluses are aggravated when weightbearing with and without shoegear. Pain is relieved with periodic professional debridement.  Chief Complaint  Patient presents with   Diabetes    I get my toenails cut.  I have calluses that I need trimmed.  Saw Dr. Charlie Tisovec - 06/11/2023; A1c - 6.9   New problem(s): None.   PCP is Tisovec, Charlie ORN, MD.  No Known Allergies  Review of Systems: Negative except as noted in the HPI.  Objective: No changes noted in today's physical examination. There were no vitals filed for this visit. Travis [name] is a pleasant 83 y.o. male WD, WN in NAD. AAO x 3.  Normal sensation of 10g monofilament Intact posterior tibialis and dorsalis pedis pulses Vascular Examination: Capillary refill time immediate b/l. Palpable pedal pulses. Pedal hair sparse b/l. No pain with calf compression b/l. Skin temperature gradient WNL b/l. No cyanosis or clubbing b/l. No ischemia or gangrene noted b/l.   Neurological Examination: Pt has subjective symptoms of neuropathy. Sensation grossly intact b/l with 10 gram monofilament. Vibratory sensation intact b/l.   Dermatological Examination: Pedal skin with normal turgor, texture and tone b/l.  No open wounds. No interdigital macerations.   Toenails 1-5 b/l thick, discolored, elongated with subungual debris and pain on dorsal palpation.   Hyperkeratotic lesion(s) submet head 2 b/l and submet head 3 b/l.  No erythema, no edema, no drainage, no fluctuance.  Musculoskeletal Examination: Muscle strength  5/5 to all lower extremity muscle groups bilaterally. Pes planus deformity noted bilateral LE.SABRA No pain, crepitus or joint limitation noted with ROM b/l LE.  Patient ambulates independently without assistive aids.  Radiographs: None  Assessment/Plan: 1. Pain due to onychomycosis of toenails of both feet   2. Callus   3. Diabetic neuropathy with neurologic complication [facility])     Consent given for treatment. Patient examined. All patient's and/or POA's questions/concerns addressed on today's visit. Mycotic toenails 1-5 debrided in length and girth without incident. Callus(es) submet head 2 b/l and submet head 3 b/l pared with sharp debridement without incident. Continue foot and shoe inspections daily. Monitor blood glucose per PCP/Endocrinologist's recommendations.Continue soft, supportive shoe gear daily. Report any pedal injuries to medical professional. Call office if there are any quesitons/concerns. -Patient/POA to call should there be question/concern in the interim.   No follow-ups on file.  Delon LITTIE Merlin, DPM      [place] LOCATION: [address] [place] [address]                   Office [phone]   [facility] LOCATION: [address] [place], [place] [address] Office [phone]

## 2023-10-25 NOTE — Progress Notes (Signed)
 Remote PPM Transmission

## 2023-10-29 NOTE — Telephone Encounter (Signed)
 I called the patient to check on the status of application -had to lmom

## 2023-10-29 NOTE — Telephone Encounter (Signed)
 I called and he got the application but has not filled out yet  He said he will bring in next week

## 2023-10-30 ENCOUNTER — Other Ambulatory Visit: Payer: Self-pay

## 2023-10-30 ENCOUNTER — Inpatient Hospital Stay (HOSPITAL_COMMUNITY)
Admission: EM | Admit: 2023-10-30 | Discharge: 2023-11-02 | DRG: 522 | Disposition: A | Discharge status: Home Health | Attending: Internal Medicine | Admitting: Internal Medicine

## 2023-10-30 ENCOUNTER — Emergency Department (HOSPITAL_COMMUNITY)

## 2023-10-30 ENCOUNTER — Encounter (HOSPITAL_COMMUNITY): Payer: Self-pay

## 2023-10-30 DIAGNOSIS — Z950 Presence of cardiac pacemaker: Secondary | ICD-10-CM

## 2023-10-30 DIAGNOSIS — S0003XA Contusion of scalp, initial encounter: Secondary | ICD-10-CM | POA: Diagnosis present

## 2023-10-30 DIAGNOSIS — Z7984 Long term (current) use of oral hypoglycemic drugs: Secondary | ICD-10-CM | POA: Diagnosis not present

## 2023-10-30 DIAGNOSIS — H9193 Unspecified hearing loss, bilateral: Secondary | ICD-10-CM | POA: Diagnosis present

## 2023-10-30 DIAGNOSIS — H409 Unspecified glaucoma: Secondary | ICD-10-CM | POA: Diagnosis present

## 2023-10-30 DIAGNOSIS — I472 Ventricular tachycardia, unspecified: Secondary | ICD-10-CM | POA: Diagnosis not present

## 2023-10-30 DIAGNOSIS — S40811A Abrasion of right upper arm, initial encounter: Secondary | ICD-10-CM | POA: Diagnosis present

## 2023-10-30 DIAGNOSIS — E785 Hyperlipidemia, unspecified: Secondary | ICD-10-CM | POA: Diagnosis present

## 2023-10-30 DIAGNOSIS — I2510 Atherosclerotic heart disease of native coronary artery without angina pectoris: Secondary | ICD-10-CM | POA: Diagnosis present

## 2023-10-30 DIAGNOSIS — S728X1A Other fracture of right femur, initial encounter for closed fracture: Secondary | ICD-10-CM | POA: Diagnosis not present

## 2023-10-30 DIAGNOSIS — W19XXXA Unspecified fall, initial encounter: Secondary | ICD-10-CM | POA: Diagnosis present

## 2023-10-30 DIAGNOSIS — Z974 Presence of external hearing-aid: Secondary | ICD-10-CM

## 2023-10-30 DIAGNOSIS — I129 Hypertensive chronic kidney disease with stage 1 through stage 4 chronic kidney disease, or unspecified chronic kidney disease: Secondary | ICD-10-CM | POA: Diagnosis present

## 2023-10-30 DIAGNOSIS — Z79899 Other long term (current) drug therapy: Secondary | ICD-10-CM

## 2023-10-30 DIAGNOSIS — I495 Sick sinus syndrome: Secondary | ICD-10-CM | POA: Diagnosis present

## 2023-10-30 DIAGNOSIS — Z7901 Long term (current) use of anticoagulants: Secondary | ICD-10-CM

## 2023-10-30 DIAGNOSIS — Z23 Encounter for immunization: Secondary | ICD-10-CM

## 2023-10-30 DIAGNOSIS — Z87891 Personal history of nicotine dependence: Secondary | ICD-10-CM | POA: Diagnosis not present

## 2023-10-30 DIAGNOSIS — S72001A Fracture of unspecified part of neck of right femur, initial encounter for closed fracture: Principal | ICD-10-CM | POA: Diagnosis present

## 2023-10-30 DIAGNOSIS — N1831 Chronic kidney disease, stage 3a: Secondary | ICD-10-CM | POA: Diagnosis present

## 2023-10-30 DIAGNOSIS — Z961 Presence of intraocular lens: Secondary | ICD-10-CM | POA: Diagnosis present

## 2023-10-30 DIAGNOSIS — E1122 Type 2 diabetes mellitus with diabetic chronic kidney disease: Secondary | ICD-10-CM | POA: Diagnosis present

## 2023-10-30 DIAGNOSIS — S72001D Fracture of unspecified part of neck of right femur, subsequent encounter for closed fracture with routine healing: Secondary | ICD-10-CM | POA: Diagnosis not present

## 2023-10-30 DIAGNOSIS — Z96641 Presence of right artificial hip joint: Secondary | ICD-10-CM

## 2023-10-30 DIAGNOSIS — E875 Hyperkalemia: Secondary | ICD-10-CM | POA: Diagnosis present

## 2023-10-30 DIAGNOSIS — E1121 Type 2 diabetes mellitus with diabetic nephropathy: Secondary | ICD-10-CM

## 2023-10-30 DIAGNOSIS — D62 Acute posthemorrhagic anemia: Secondary | ICD-10-CM | POA: Diagnosis not present

## 2023-10-30 DIAGNOSIS — I480 Paroxysmal atrial fibrillation: Secondary | ICD-10-CM | POA: Diagnosis present

## 2023-10-30 LAB — COMPREHENSIVE METABOLIC PANEL WITH GFR
ALT: 22 U/L (ref 0–44)
AST: 33 U/L (ref 15–41)
Albumin: 4.1 g/dL (ref 3.5–5.0)
Alkaline Phosphatase: 48 U/L (ref 38–126)
Anion gap: 18 — ABNORMAL HIGH (ref 5–15)
BUN: 26 mg/dL — ABNORMAL HIGH (ref 8–23)
CO2: 17 mmol/L — ABNORMAL LOW (ref 22–32)
Calcium: 10 mg/dL (ref 8.9–10.3)
Chloride: 106 mmol/L (ref 98–111)
Creatinine, Ser: 1.5 mg/dL — ABNORMAL HIGH (ref 0.61–1.24)
GFR, Estimated: 46 mL/min — ABNORMAL LOW (ref >60)
Glucose, Bld: 110 mg/dL — ABNORMAL HIGH (ref 70–99)
Potassium: 5.2 mmol/L — ABNORMAL HIGH (ref 3.5–5.1)
Sodium: 141 mmol/L (ref 135–145)
Total Bilirubin: 2 mg/dL — ABNORMAL HIGH (ref 0.0–1.2)
Total Protein: 6.3 g/dL — ABNORMAL LOW (ref 6.5–8.1)

## 2023-10-30 LAB — CBC WITH DIFFERENTIAL/PLATELET
Abs Immature Granulocytes: 0.05 K/uL (ref 0.00–0.07)
Basophils Absolute: 0 K/uL (ref 0.0–0.1)
Basophils Relative: 0 %
Eosinophils Absolute: 0.1 K/uL (ref 0.0–0.5)
Eosinophils Relative: 1 %
HCT: 34.5 % — ABNORMAL LOW (ref 39.0–52.0)
Hemoglobin: 11.9 g/dL — ABNORMAL LOW (ref 13.0–17.0)
Immature Granulocytes: 1 %
Lymphocytes Relative: 10 %
Lymphs Abs: 1.1 K/uL (ref 0.7–4.0)
MCH: 33 pg (ref 26.0–34.0)
MCHC: 34.5 g/dL (ref 30.0–36.0)
MCV: 95.6 fL (ref 80.0–100.0)
Monocytes Absolute: 0.6 K/uL (ref 0.1–1.0)
Monocytes Relative: 6 %
Neutro Abs: 9 K/uL — ABNORMAL HIGH (ref 1.7–7.7)
Neutrophils Relative %: 82 %
Platelets: 244 K/uL (ref 150–400)
RBC: 3.61 MIL/uL — ABNORMAL LOW (ref 4.22–5.81)
RDW: 11.9 % (ref 11.5–15.5)
WBC: 10.9 K/uL — ABNORMAL HIGH (ref 4.0–10.5)
nRBC: 0 % (ref 0.0–0.2)

## 2023-10-30 LAB — SURGICAL PCR SCREEN
MRSA, PCR: NEGATIVE
Staphylococcus aureus: NEGATIVE

## 2023-10-30 LAB — TYPE AND SCREEN
ABO/RH(D): A POS
Antibody Screen: NEGATIVE

## 2023-10-30 LAB — HEMOGLOBIN A1C
Hgb A1c MFr Bld: 6.2 % — ABNORMAL HIGH (ref 4.8–5.6)
Mean Plasma Glucose: 131.24 mg/dL

## 2023-10-30 LAB — ABO/RH: ABO/RH(D): A POS

## 2023-10-30 LAB — I-STAT CG4 LACTIC ACID, ED: Lactic Acid, Venous: 2 mmol/L (ref 0.5–1.9)

## 2023-10-30 LAB — GLUCOSE, CAPILLARY
Glucose-Capillary: 140 mg/dL — ABNORMAL HIGH (ref 70–99)
Glucose-Capillary: 73 mg/dL (ref 70–99)

## 2023-10-30 MED ORDER — PANTOPRAZOLE SODIUM 40 MG IV SOLR
40.0000 mg | Freq: Two times a day (BID) | INTRAVENOUS | Status: DC
  Administered 2023-10-30 – 2023-11-02 (×7): 40 mg via INTRAVENOUS
  Filled 2023-10-30 (×7): qty 10

## 2023-10-30 MED ORDER — SODIUM CHLORIDE 0.9 % IV SOLN: INTRAVENOUS | Status: DC

## 2023-10-30 MED ORDER — POLYETHYLENE GLYCOL 3350 17 G PO PACK: 17.0000 g | PACK | Freq: Every day | ORAL | Status: DC | PRN

## 2023-10-30 MED ORDER — HYDRALAZINE HCL 20 MG/ML IJ SOLN: 5.0000 mg | INTRAMUSCULAR | Status: DC | PRN

## 2023-10-30 MED ORDER — INSULIN ASPART 100 UNIT/ML IJ SOLN
0.0000 [IU] | INTRAMUSCULAR | Status: DC
  Administered 2023-10-31: 2 [IU] via SUBCUTANEOUS
  Administered 2023-10-31 (×4): 1 [IU] via SUBCUTANEOUS
  Administered 2023-11-01 (×2): 5 [IU] via SUBCUTANEOUS
  Administered 2023-11-01 (×3): 1 [IU] via SUBCUTANEOUS
  Administered 2023-11-01 – 2023-11-02 (×3): 3 [IU] via SUBCUTANEOUS

## 2023-10-30 MED ORDER — FENTANYL CITRATE (PF) 50 MCG/ML IJ SOSY
50.0000 ug | PREFILLED_SYRINGE | Freq: Once | INTRAMUSCULAR | Status: AC
  Administered 2023-10-30: 50 ug via INTRAVENOUS
  Filled 2023-10-30: qty 1

## 2023-10-30 MED ORDER — BRIMONIDINE TARTRATE-TIMOLOL 0.2-0.5 % OP SOLN: 1.0000 [drp] | Freq: Two times a day (BID) | OPHTHALMIC | Status: DC

## 2023-10-30 MED ORDER — DOCUSATE SODIUM 100 MG PO CAPS
100.0000 mg | ORAL_CAPSULE | Freq: Two times a day (BID) | ORAL | Status: AC
  Administered 2023-10-30 (×2): 100 mg via ORAL
  Filled 2023-10-30 (×2): qty 1

## 2023-10-30 MED ORDER — LACTATED RINGERS IV BOLUS
500.0000 mL | Freq: Once | INTRAVENOUS | Status: AC
  Administered 2023-10-30: 500 mL via INTRAVENOUS

## 2023-10-30 MED ORDER — METHOCARBAMOL 1000 MG/10ML IJ SOLN: 500.0000 mg | Freq: Four times a day (QID) | INTRAMUSCULAR | Status: DC | PRN

## 2023-10-30 MED ORDER — MORPHINE SULFATE (PF) 2 MG/ML IV SOLN
2.0000 mg | INTRAVENOUS | Status: DC | PRN
  Administered 2023-10-30: 2 mg via INTRAVENOUS
  Filled 2023-10-30: qty 1

## 2023-10-30 MED ORDER — METHOCARBAMOL 500 MG PO TABS
500.0000 mg | ORAL_TABLET | Freq: Four times a day (QID) | ORAL | Status: DC | PRN
  Administered 2023-10-30: 500 mg via ORAL
  Filled 2023-10-30: qty 1

## 2023-10-30 MED ORDER — HYDROCODONE-ACETAMINOPHEN 5-325 MG PO TABS
1.0000 | ORAL_TABLET | Freq: Four times a day (QID) | ORAL | Status: DC | PRN
  Administered 2023-10-31: 2 via ORAL
  Filled 2023-10-30: qty 2

## 2023-10-30 MED ORDER — ATORVASTATIN CALCIUM 40 MG PO TABS
40.0000 mg | ORAL_TABLET | Freq: Every day | ORAL | Status: DC
  Administered 2023-10-31 – 2023-11-02 (×3): 40 mg via ORAL
  Filled 2023-10-30 (×3): qty 1

## 2023-10-30 MED ORDER — TIMOLOL MALEATE 0.5 % OP SOLN
1.0000 [drp] | Freq: Two times a day (BID) | OPHTHALMIC | Status: DC
  Administered 2023-10-30 – 2023-11-02 (×6): 1 [drp] via OPHTHALMIC
  Filled 2023-10-30: qty 5

## 2023-10-30 MED ORDER — BISACODYL 5 MG PO TBEC: 5.0000 mg | DELAYED_RELEASE_TABLET | Freq: Every day | ORAL | Status: DC | PRN

## 2023-10-30 MED ORDER — BRIMONIDINE TARTRATE 0.2 % OP SOLN
1.0000 [drp] | Freq: Two times a day (BID) | OPHTHALMIC | Status: DC
  Administered 2023-10-30 – 2023-11-02 (×6): 1 [drp] via OPHTHALMIC
  Filled 2023-10-30: qty 5

## 2023-10-30 NOTE — ED Notes (Signed)
Patient transported to CT by TRN ?

## 2023-10-30 NOTE — ED Notes (Signed)
 Lactic results paden b.rn at

## 2023-10-30 NOTE — ED Provider Notes (Signed)
 [name] EMERGENCY DEPARTMENT AT [name] Provider Note   CSN: [id number] Arrival date & time: 10/30/23  1119     Patient presents with: Travis [name] is a 83 y.o. male with pacemaker, h/o afib on Travis [name]  presents to the ER today for evaluation after a mechanical fall shortly prior to arrival. The patient was crossing the finish line after running a 10k and tripped on the red speed bumps causing him to fall, landing on his right hip, hit right side of head, and put right arm through plastic netting barricade. Denies LOC. Denies any neck, chest, belly, or back pain. Reports pain to his right lateral hip. Denies any numbness or tingling. He was unable to bear weight on the leg and was brought in by EMS. He has abrasions to his right upper extremity and right upper arm, but reports achy instead of pain.    Fall       Prior to Admission medications   Medication Sig Start Date End Date Taking? Authorizing Provider  amLODipine  (NORVASC ) 5 MG tablet Take 1 tablet (5 mg total) by mouth daily. 06/10/23   Travis [name], Mihai, [name]  apixaban  (Travis [name] ) 5 MG TABS tablet Take 1 tablet (5 mg total) by mouth 2 (two) times daily. 10/15/23   Travis Fairy PARAS, PA-C  atorvastatin  (LIPITOR) 40 MG tablet Take 1 tablet (40 mg total) by mouth daily. 08/20/22   Travis [name]  Blood Glucose Monitoring Suppl (ONETOUCH VERIO FLEX SYSTEM) w/Device KIT Use to test blood sugars 2-3x a week 04/15/18   Travis [name]  brimonidine -timolol  (COMBIGAN ) 0.2-0.5 % ophthalmic solution Place 1 drop into both eyes 2 (two) times daily. 06/14/23     Travis [name] (Travis [name]) 2000 units capsule Take 2,000 Units by mouth daily.    Travis [name]  ezetimibe  (ZETIA ) 10 MG tablet Take 1 tablet (10 mg total) by mouth daily. 03/30/23   Travis [name]  glucose blood (ONETOUCH VERIO) test strip Use to test blood sugars 2-3x a week E11.9 04/15/18   Travis [name]  hydrochlorothiazide   (MICROZIDE ) 12.5 MG capsule Take 1 capsule (12.5 mg total) by mouth daily. 02/05/23   Travis [name], Mihai, [name]  lisinopril  (ZESTRIL ) 20 MG tablet Take 1 tablet (20 mg total) by mouth daily. 04/14/23   Travis [name], Mihai, [name]  metFORMIN  (GLUCOPHAGE ) 500 MG tablet Take 1 tablet (500 mg total) by mouth 2 (two) times daily. 12/07/22       Allergies: Patient has no known allergies.    Review of Systems  Constitutional:  Negative for chills and fever.    Updated Vital Signs BP (!) 143/76   Pulse 72   Temp (!) 97.4 F (36.3 C) (Oral)   Resp (!) 22   Ht 5' 11 (1.803 m)   Wt 68 kg   SpO2 100%   BMI 20.92 kg/m   Physical Exam Vitals and nursing note reviewed.  Constitutional:      General: He is not in acute distress.    Appearance: He is not ill-appearing or toxic-appearing.     Comments: Pleasant. Hard of hearing, hearing aids present.  HENT:     Head:      Comments: Small forming hematoma to the area marked above. No battle signs or raccoon eyes. No other tenderness to palpation.  Eyes:     General: No scleral icterus.    Extraocular Movements: Extraocular movements intact.     Pupils: Pupils are equal, round, and reactive  to light.  Neck:     Comments: Non tender to palpation. No overlying skin changes or signs of trauma.  Cardiovascular:     Rate and Rhythm: Normal rate.  Pulmonary:     Effort: Pulmonary effort is normal. No respiratory distress.     Comments: Pace maker present in right upper chest. Skin appears atraumatic. Non tender to palpation.  Abdominal:     Palpations: Abdomen is soft.     Tenderness: There is no abdominal tenderness. There is no guarding or rebound.     Comments: Nontender to palpation. Soft. No overlying skin changes or signs of trauma.   Musculoskeletal:        General: Tenderness present.     Cervical back: No tenderness.     Comments: Superficial skin tear seen to the posterior aspect of the right upper arm. Small skin tear seen to the dorsum of the  right wrist.  No step-off tenderness.  No other tenderness into the hand.  Equal grip strength.  Patient has palpable DP, PT, radial pulses that are symmetric.  Brisk cap refill.  Still little wiggle toes.  No other tenderness of the lower extremity.  Does have some tenderness to the right lateral hip.  Skin appears intact.  Compartments are soft throughout.  No tenderness to the cervical, thoracic, or lumbar spine.  Skin:    General: Skin is warm and dry.  Neurological:     Mental Status: He is alert and oriented to person, place, and time.     GCS: GCS eye subscore is 4. GCS verbal subscore is 5. GCS motor subscore is 6.     Cranial Nerves: No cranial nerve deficit, dysarthria or facial asymmetry.     Sensory: No sensory deficit.    (all labs ordered are listed, but only abnormal results are displayed) Labs Reviewed  CBC WITH DIFFERENTIAL/PLATELET - Abnormal; Notable for the following components:      Result Value   WBC 10.9 (*)    RBC 3.61 (*)    Hemoglobin 11.9 (*)    HCT 34.5 (*)    Neutro Abs 9.0 (*)    All other components within normal limits  COMPREHENSIVE METABOLIC PANEL WITH GFR    EKG: EKG Interpretation Date/Time:  Saturday October 30 2023 11:26:31 EDT Ventricular Rate:  75 PR Interval:  131 QRS Duration:  83 QT Interval:  367 QTC Calculation: 410 R Axis:   78  Text Interpretation: Sinus rhythm No significant change since last tracing Confirmed by Travis [name] ([id number]) on 10/30/2023 11:30:22 AM  Radiology: ARCOLA HIP UNILAT WITH PELVIS 2-3 VIEWS RIGHT Result Date: 10/30/2023 CLINICAL DATA:  Travis [name] today while running a race. EXAM: DG HIP (WITH OR WITHOUT PELVIS) 2-3V RIGHT COMPARISON:  None Available. FINDINGS: There is a mildly displaced femoral neck fracture. The bony pelvis is intact. The left hip is intact. IMPRESSION: Mildly displaced right femoral neck fracture. Electronically Signed   By: Travis [name] M.D.   On: 10/30/2023 12:15   CT Head Wo  Contrast Result Date: 10/30/2023 CLINICAL DATA:  Travis [name].  Hit head. EXAM: CT HEAD WITHOUT CONTRAST CT CERVICAL SPINE WITHOUT CONTRAST TECHNIQUE: Multidetector CT imaging of the head and cervical spine was performed following the standard protocol without intravenous contrast. Multiplanar CT image reconstructions of the cervical spine were also generated. RADIATION DOSE REDUCTION: This exam was performed according to the departmental dose-optimization program which includes automated exposure control, adjustment of the mA and/or kV according to patient size and/or  use of iterative reconstruction technique. COMPARISON:  Prior head CT from 10/11/2022 FINDINGS: CT HEAD FINDINGS Brain: Stable age related cerebral atrophy, ventriculomegaly and periventricular white matter disease. No extra-axial fluid collections are identified. No CT findings for acute hemispheric infarction or intracranial hemorrhage. No mass lesions. The brainstem and cerebellum are normal. Vascular: Stable vascular calcifications. No aneurysm or hyperdense vessels. Skull: No skull fracture or bone lesions. Sinuses/Orbits: The paranasal sinuses and mastoid air cells are clear. Stable leftward deviation of the bony nasal septum. The globes are intact. Other: No scalp lesions or scalp hematoma. CT CERVICAL SPINE FINDINGS Alignment: Grossly normal. Mild degenerative anterior subluxation of C7 compared to T1. Skull base and vertebrae: No acute fracture. No primary bone lesion or focal pathologic process. Soft tissues and spinal canal: No prevertebral fluid or swelling. No visible canal hematoma. Disc levels: The spinal canal is fairly generous. No large disc protrusions or canal stenosis. Mild uncinate spurring and facet disease but no significant foraminal stenosis. Upper chest: The lung apices are grossly clear. Other: No neck mass or adenopathy or hematoma. Extensive bilateral carotid artery calcifications. IMPRESSION: 1. Stable age related cerebral  atrophy, ventriculomegaly and periventricular white matter disease. 2. No acute intracranial findings or skull fracture. 3. Normal alignment of the cervical vertebral bodies and no acute fracture. Electronically Signed   By: Travis [name] M.D.   On: 10/30/2023 12:14   CT Cervical Spine Wo Contrast Result Date: 10/30/2023 CLINICAL DATA:  Travis [name].  Hit head. EXAM: CT HEAD WITHOUT CONTRAST CT CERVICAL SPINE WITHOUT CONTRAST TECHNIQUE: Multidetector CT imaging of the head and cervical spine was performed following the standard protocol without intravenous contrast. Multiplanar CT image reconstructions of the cervical spine were also generated. RADIATION DOSE REDUCTION: This exam was performed according to the departmental dose-optimization program which includes automated exposure control, adjustment of the mA and/or kV according to patient size and/or use of iterative reconstruction technique. COMPARISON:  Prior head CT from 10/11/2022 FINDINGS: CT HEAD FINDINGS Brain: Stable age related cerebral atrophy, ventriculomegaly and periventricular white matter disease. No extra-axial fluid collections are identified. No CT findings for acute hemispheric infarction or intracranial hemorrhage. No mass lesions. The brainstem and cerebellum are normal. Vascular: Stable vascular calcifications. No aneurysm or hyperdense vessels. Skull: No skull fracture or bone lesions. Sinuses/Orbits: The paranasal sinuses and mastoid air cells are clear. Stable leftward deviation of the bony nasal septum. The globes are intact. Other: No scalp lesions or scalp hematoma. CT CERVICAL SPINE FINDINGS Alignment: Grossly normal. Mild degenerative anterior subluxation of C7 compared to T1. Skull base and vertebrae: No acute fracture. No primary bone lesion or focal pathologic process. Soft tissues and spinal canal: No prevertebral fluid or swelling. No visible canal hematoma. Disc levels: The spinal canal is fairly generous. No large disc protrusions  or canal stenosis. Mild uncinate spurring and facet disease but no significant foraminal stenosis. Upper chest: The lung apices are grossly clear. Other: No neck mass or adenopathy or hematoma. Extensive bilateral carotid artery calcifications. IMPRESSION: 1. Stable age related cerebral atrophy, ventriculomegaly and periventricular white matter disease. 2. No acute intracranial findings or skull fracture. 3. Normal alignment of the cervical vertebral bodies and no acute fracture. Electronically Signed   By: Travis [name] M.D.   On: 10/30/2023 12:14   DG Chest Portable 1 View Result Date: 10/30/2023 CLINICAL DATA:  Travis [name] during a 10 K race. EXAM: PORTABLE CHEST 1 VIEW COMPARISON:  06/11/2016 FINDINGS: The pacer wires are stable. The cardiac silhouette, mediastinal and  hilar contours are normal. Stable mild tortuosity and calcification of the thoracic aorta. Moderate to large hiatal hernia again noted. The lungs are clear. No pulmonary contusion, pleural effusion or pneumothorax. The bony pelvis is intact.  No definite rib fractures. IMPRESSION: 1. No acute cardiopulmonary findings. 2. Moderate to large hiatal hernia. 3. No definite rib fractures. Electronically Signed   By: Travis [name] M.D.   On: 10/30/2023 12:05   Procedures   Medications Ordered in the ED  fentaNYL  (SUBLIMAZE ) injection 50 mcg (50 mcg Intravenous Given 10/30/23 1304)  lactated ringers bolus 500 mL (500 mLs Intravenous New Bag/Given 10/30/23 1348)    Clinical Course as of 10/30/23 1316  Sat Oct 30, 2023  1224 Tdap done 11/2020 [RR]    Clinical Course User Index [RR] Bernis Ernst, PA-C   Medical Decision Making Amount and/or Complexity of Data Reviewed Labs: ordered. Radiology: ordered.  Risk Prescription drug management. Decision regarding hospitalization.   82 y.o. male presents to the ER for evaluation of right hip pain after mechanical fall. Differential diagnosis includes but is not limited to fracture, contusion,  skin tear. Vital signs mildly elevated BP, temp 97.67F , otherwise unremarkable. Physical exam as noted above.   Concern for hip fracture.  He has no signs of trauma or any other tenderness to the bilateral arms, bilateral lower legs, excluding the right lateral hip, back, chest, or abdomen.  He has no neck tenderness palpation however given Nexus criteria will order CT of the head and cervical spine.  I have ordered a chest x-ray and abundance of precaution.  Also ordered an x-ray of his right humerus, I felt this is more skin tenderness that it is bony tenderness.  His tetanus is up-to-date.  I independently reviewed and interpreted the patient's labs.  CBC shows mild leukocytosis of 10.9 with a left shift.  Likely stress reaction.  Hemoglobin 11.9 with hematocrit of 34.5.  This does appear lower than his lab he had last year.  CMP shows mild potassium elevated at 5.2.  Bicarb of 17 with low glucose of 110.  BUN of 26 with a creatinine of 1.50.  Total protein 6.3.  Total bili 2.0 with an anion gap of 18.  Labs likely consistent with some dehydration as patient just ran a 10K. Will order fluids.   CT Head and cervical spine 1. Stable age related cerebral atrophy, ventriculomegaly and periventricular white matter disease. 2. No acute intracranial findings or skull fracture. 3. Normal alignment of the cervical vertebral bodies and no acute fracture. Per radiologist's interpretation.    CXR port 1. No acute cardiopulmonary findings. 2. Moderate to large hiatal hernia. 3. No definite rib fractures. Per radiologist's interpretation.    XR pelvis Mildly displaced right femoral neck fracture. Per radiologist's interpretation.    Skin tears were cleansed by nursing staff and Xeroform/Vaseline gauze placed.  Given the fracture, I consulted orthopedics and with Dr. Barton.  Plan is to admit to medicine.  Hold nightly Travis [name]  dose.  N.p.o. after midnight.  He will be consulting one of the hip orthopedic  providers to see who will be performing surgery, likely tomorrow.  They will see him tomorrow.  Discussed results with patient and family at bedside.  Amenable to admission. Admit to Dr. Tobie.   Portions of this report may have been transcribed using voice recognition software. Every effort was made to ensure accuracy; however, inadvertent computerized transcription errors may be present.   Final diagnoses:  Fall, initial encounter  Contusion  of scalp, initial encounter  Closed displaced fracture of right femoral neck [name])    ED Discharge Orders     None          Bernis Ernst, [place] 10/30/23 8367    Travis Longs, [name] 11/01/23 1134

## 2023-10-30 NOTE — Progress Notes (Signed)
 Orthopedic Tech Progress Note Patient Details:  Travis [name] December 18, 1940 [id number] Level 2 Trauma; Fall on blood thinners  Patient ID: Travis [name], male   DOB: 1940-03-24, 83 y.o.   MRN: [id number]  Travis [name] Bar 10/30/2023, 11:26 AM

## 2023-10-30 NOTE — ED Notes (Signed)
 Trauma Response Nurse Documentation  Travis [name] is a 83 y.o. male arriving to [facility] ED via EMS  On Eliquis  (apixaban ) daily. Trauma was activated as a Level 2 based on the following trauma criteria Elderly patients > 65 with head trauma on anti-coagulation (excluding ASA).  Patient cleared for CT by Dr. Dreama. Pt transported to CT with trauma response nurse present to monitor. RN remained with the patient throughout their absence from the department for clinical observation. GCS 15.  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   SSS (sick sinus syndrome) (HCC) 06/10/2016   Type II diabetes mellitus (HCC)      Past Surgical History:  Procedure Laterality Date   APPENDECTOMY     CARDIAC CATHETERIZATION  06/10/2016   CARDIOVERSION N/A 10/01/2023   Procedure: CARDIOVERSION;  Surgeon: Francyne Headland, MD;  Location: [facility] INVASIVE CV LAB;  Service: Cardiovascular;  Laterality: N/A;   CATARACT EXTRACTION W/ INTRAOCULAR LENS IMPLANT Right    CYSTOSCOPY W/ STONE MANIPULATION     INSERT / REPLACE / REMOVE PACEMAKER  06/10/2016   dual chamber permanent pacemaker   KNEE ARTHROSCOPY W/ MENISCECTOMY Bilateral    LAPAROSCOPIC CHOLECYSTECTOMY     LEFT HEART CATH AND CORONARY ANGIOGRAPHY N/A 06/10/2016   Procedure: Left Heart Cath and Coronary Angiography;  Surgeon: Cherrie Toribio SAUNDERS, MD;  Location: [facility] INVASIVE CV LAB;  Service: Cardiovascular;  Laterality: N/A;   LITHOTRIPSY     PACEMAKER IMPLANT N/A 06/10/2016   Procedure: Pacemaker Implant;  Surgeon: Francyne Headland, MD;  Location: [facility] INVASIVE CV LAB;  Service: Cardiovascular;  Laterality: N/A;   TONSILLECTOMY       Initial Focused Assessment (If applicable, or please see trauma  documentation): Patient A&Ox4, GCS 15, PERR 3 Airway intact, bilateral breath sounds Pulses 2+ Pain to R hip, skin lac to R arm  CT's Completed:   CT Head and CT C-Spine   Interventions:  IV, labs CXR/PXR CT Head/Cspine Tdap UTD 2022  Plan for disposition:  {Trauma Dispo:26867}   Consults completed:  {Trauma Consults:26862} at ***.  Event Summary: Patient to ED via EMS after a fall while completing a 10K. Patient tripped at the finish line, falling onto pavement hitting his right side causing R hip pain, R arm lac and abrasion on head. Patient is on Eliquis  for Afib/Pacemaker. Imaging was completed and revealed   Bedside handoff with ED RN [name].    [name]  Trauma Response RN  Please call TRN at [phone] for further assistance.

## 2023-10-30 NOTE — Consult Note (Signed)
 Patient ID: Travis [name] MRN: [id number] DOB/AGE: [birth year] 83 y.o.  Admit date: 10/30/2023  Admission Diagnoses:  Principal Problem:   Other fracture of right femur, initial encounter for closed fracture (HCC)   HPI: Ortho consult for right displaced femoral neck fracture sustained when he tripped earlier today at the finish line after completing a 10k marathon.  PMH notable for sick sinus syndrome status post pacemaker/paroxysmal A-fib on Eliquis , CAD, CKD stage IIIa, diabetes mellitus type 2, glaucoma .  Denies numbness/tingling.  Past Medical History: 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   SSS (sick sinus syndrome) (HCC) 06/10/2016   Type II diabetes mellitus (HCC)     Surgical History: Past Surgical History:  Procedure Laterality Date   APPENDECTOMY     CARDIAC CATHETERIZATION  06/10/2016   CARDIOVERSION N/A 10/01/2023   Procedure: CARDIOVERSION;  Surgeon: Francyne Headland, MD;  Location: [facility];  Service: Cardiovascular;  Laterality: N/A;   CATARACT EXTRACTION W/ INTRAOCULAR LENS IMPLANT Right    CYSTOSCOPY W/ STONE MANIPULATION     INSERT / REPLACE / REMOVE PACEMAKER  06/10/2016   dual chamber permanent pacemaker   KNEE ARTHROSCOPY W/ MENISCECTOMY Bilateral    LAPAROSCOPIC CHOLECYSTECTOMY     LEFT HEART CATH AND CORONARY ANGIOGRAPHY N/A 06/10/2016   Procedure: Left Heart Cath and Coronary Angiography;  Surgeon: Cherrie Toribio SAUNDERS, MD;  Location: [facility];  Service: Cardiovascular;  Laterality: N/A;   LITHOTRIPSY     PACEMAKER IMPLANT N/A 06/10/2016   Procedure: Pacemaker Implant;  Surgeon: Francyne Headland, MD;  Location: [facility];  Service: Cardiovascular;  Laterality: N/A;    TONSILLECTOMY      Family History: Family History  Problem Relation Age of Onset   Colon cancer Father     Social History: Social History   Socioeconomic History   Marital status: Widowed    Spouse name: Not on file   Number of children: Not on file   Years of education: Not on file   Highest education level: Not on file  Occupational History   Not on file  Tobacco Use   Smoking status: Former    Current packs/day: 0.00    Average packs/day: 0.5 packs/day for 25.0 years (12.5 ttl pk-yrs)    Types: Cigarettes    Start date: 54    Quit date: 61    Years since quitting: 42.8   Smokeless tobacco: Never   Tobacco comments:    Former smoker 09/01/23  Vaping Use   Vaping status: Never Used  Substance and Sexual Activity   Alcohol use: Yes    Comment: 06/10/2016 might have 2 beers/month   Drug use: No   Sexual activity: Not on file  Other Topics Concern   Not on file  Social History Narrative   Not on file   Social Drivers of Health   Financial Resource Strain: Not on file  Food Insecurity: No Food Insecurity (10/30/2023)   Hunger Vital Sign    Worried About Running Out of Food in the Last Year: Never true    Ran Out of Food in the Last Year: Never true  Transportation Needs: No Transportation Needs (10/30/2023)  PRAPARE - Administrator, Civil Service (Medical): No    Lack of Transportation (Non-Medical): No  Physical Activity: Not on file  Stress: Not on file  Social Connections: Moderately Isolated (10/30/2023)   Social Connection and Isolation Panel    Frequency of Communication with Friends and Family: Three times a week    Frequency of Social Gatherings with Friends and Family: More than three times a week    Attends Religious Services: Never    Database administrator or Organizations: No    Attends Engineer, structural: More than 4 times per year    Marital Status: Widowed  Intimate Partner Violence: Not At Risk (10/30/2023)    Humiliation, Afraid, Rape, and Kick questionnaire    Fear of Current or Ex-Partner: No    Emotionally Abused: No    Physically Abused: No    Sexually Abused: No    Allergies: Patient has no known allergies.  Medications: I have reviewed the patient's current medications.  Vital Signs: Patient Vitals for the past 24 hrs:  BP Temp Temp src Pulse Resp SpO2 Height Weight  10/30/23 2030 119/63 97.8 F (36.6 C) Oral -- -- 100 % -- --  10/30/23 1655 (!) 146/78 -- Oral 72 16 100 % -- --  10/30/23 1400 (!) 151/69 -- -- 85 16 100 % -- --  10/30/23 1307 -- (!) 97.4 F (36.3 C) Oral -- -- -- -- --  10/30/23 1245 (!) 143/76 -- -- 72 (!) 22 100 % -- --  10/30/23 1200 130/64 -- -- 77 20 100 % -- --  10/30/23 1130 117/80 -- -- 74 16 100 % -- --  10/30/23 1129 -- -- -- -- -- -- 5' 11 (1.803 m) 68 kg  10/30/23 1125 -- -- -- -- -- 99 % -- --  10/30/23 1123 108/66 -- -- -- -- -- -- --    Radiology: DG Humerus Right Result Date: 10/30/2023 CLINICAL DATA:  Abrasion right arm after tripping and falling. EXAM: RIGHT HUMERUS - 2+ VIEW COMPARISON:  None Available. FINDINGS: There is no evidence of fracture or other focal bone lesions. Soft tissues are unremarkable. IMPRESSION: Negative. Electronically Signed   By: Camellia Candle M.D.   On: 10/30/2023 13:29   DG HIP UNILAT WITH PELVIS 2-3 VIEWS RIGHT Result Date: 10/30/2023 CLINICAL DATA:  [name] today while running a race. EXAM: DG HIP (WITH OR WITHOUT PELVIS) 2-3V RIGHT COMPARISON:  None Available. FINDINGS: There is a mildly displaced femoral neck fracture. The bony pelvis is intact. The left hip is intact. IMPRESSION: Mildly displaced right femoral neck fracture. Electronically Signed   By: MYRTIS Stammer M.D.   On: 10/30/2023 12:15   CT Head Wo Contrast Result Date: 10/30/2023 CLINICAL DATA:  [name].  Hit head. EXAM: CT HEAD WITHOUT CONTRAST CT CERVICAL SPINE WITHOUT CONTRAST TECHNIQUE: Multidetector CT imaging of the head and cervical spine was  performed following the standard protocol without intravenous contrast. Multiplanar CT image reconstructions of the cervical spine were also generated. RADIATION DOSE REDUCTION: This exam was performed according to the departmental dose-optimization program which includes automated exposure control, adjustment of the mA and/or kV according to patient size and/or use of iterative reconstruction technique. COMPARISON:  Prior head CT from 10/11/2022 FINDINGS: CT HEAD FINDINGS Brain: Stable age related cerebral atrophy, ventriculomegaly and periventricular white matter disease. No extra-axial fluid collections are identified. No CT findings for acute hemispheric infarction or intracranial hemorrhage. No mass lesions. The brainstem and cerebellum are  normal. Vascular: Stable vascular calcifications. No aneurysm or hyperdense vessels. Skull: No skull fracture or bone lesions. Sinuses/Orbits: The paranasal sinuses and mastoid air cells are clear. Stable leftward deviation of the bony nasal septum. The globes are intact. Other: No scalp lesions or scalp hematoma. CT CERVICAL SPINE FINDINGS Alignment: Grossly normal. Mild degenerative anterior subluxation of C7 compared to T1. Skull base and vertebrae: No acute fracture. No primary bone lesion or focal pathologic process. Soft tissues and spinal canal: No prevertebral fluid or swelling. No visible canal hematoma. Disc levels: The spinal canal is fairly generous. No large disc protrusions or canal stenosis. Mild uncinate spurring and facet disease but no significant foraminal stenosis. Upper chest: The lung apices are grossly clear. Other: No neck mass or adenopathy or hematoma. Extensive bilateral carotid artery calcifications. IMPRESSION: 1. Stable age related cerebral atrophy, ventriculomegaly and periventricular white matter disease. 2. No acute intracranial findings or skull fracture. 3. Normal alignment of the cervical vertebral bodies and no acute fracture.  Electronically Signed   By: MYRTIS Stammer M.D.   On: 10/30/2023 12:14   CT Cervical Spine Wo Contrast Result Date: 10/30/2023 CLINICAL DATA:  [name].  Hit head. EXAM: CT HEAD WITHOUT CONTRAST CT CERVICAL SPINE WITHOUT CONTRAST TECHNIQUE: Multidetector CT imaging of the head and cervical spine was performed following the standard protocol without intravenous contrast. Multiplanar CT image reconstructions of the cervical spine were also generated. RADIATION DOSE REDUCTION: This exam was performed according to the departmental dose-optimization program which includes automated exposure control, adjustment of the mA and/or kV according to patient size and/or use of iterative reconstruction technique. COMPARISON:  Prior head CT from 10/11/2022 FINDINGS: CT HEAD FINDINGS Brain: Stable age related cerebral atrophy, ventriculomegaly and periventricular white matter disease. No extra-axial fluid collections are identified. No CT findings for acute hemispheric infarction or intracranial hemorrhage. No mass lesions. The brainstem and cerebellum are normal. Vascular: Stable vascular calcifications. No aneurysm or hyperdense vessels. Skull: No skull fracture or bone lesions. Sinuses/Orbits: The paranasal sinuses and mastoid air cells are clear. Stable leftward deviation of the bony nasal septum. The globes are intact. Other: No scalp lesions or scalp hematoma. CT CERVICAL SPINE FINDINGS Alignment: Grossly normal. Mild degenerative anterior subluxation of C7 compared to T1. Skull base and vertebrae: No acute fracture. No primary bone lesion or focal pathologic process. Soft tissues and spinal canal: No prevertebral fluid or swelling. No visible canal hematoma. Disc levels: The spinal canal is fairly generous. No large disc protrusions or canal stenosis. Mild uncinate spurring and facet disease but no significant foraminal stenosis. Upper chest: The lung apices are grossly clear. Other: No neck mass or adenopathy or hematoma.  Extensive bilateral carotid artery calcifications. IMPRESSION: 1. Stable age related cerebral atrophy, ventriculomegaly and periventricular white matter disease. 2. No acute intracranial findings or skull fracture. 3. Normal alignment of the cervical vertebral bodies and no acute fracture. Electronically Signed   By: MYRTIS Stammer M.D.   On: 10/30/2023 12:14   DG Chest Portable 1 View Result Date: 10/30/2023 CLINICAL DATA:  [name] during a 10 K race. EXAM: PORTABLE CHEST 1 VIEW COMPARISON:  06/11/2016 FINDINGS: The pacer wires are stable. The cardiac silhouette, mediastinal and hilar contours are normal. Stable mild tortuosity and calcification of the thoracic aorta. Moderate to large hiatal hernia again noted. The lungs are clear. No pulmonary contusion, pleural effusion or pneumothorax. The bony pelvis is intact.  No definite rib fractures. IMPRESSION: 1. No acute cardiopulmonary findings. 2. Moderate to large hiatal  hernia. 3. No definite rib fractures. Electronically Signed   By: MYRTIS Stammer M.D.   On: 10/30/2023 12:05   CUP PACEART REMOTE DEVICE CHECK Result Date: 10/22/2023 PPM Scheduled remote reviewed. Normal device function.  Presenting rhythm:  AP-VS.  49 AHR detections, EGMs consistent with AF/AFL, longest episodes lasting > 99 hours. History of AF, on Eliquis  per Epic. Increased AF up to 100% from June through September on trends, routing to triage. Next remote transmission per protocol. - CS, CVRS  EP STUDY Result Date: 10/01/2023 See surgical note for result.   Labs: Recent Labs    10/30/23 1131  WBC 10.9*  RBC 3.61*  HCT 34.5*  PLT 244   Recent Labs    10/30/23 1131  NA 141  K 5.2*  CL 106  CO2 17*  BUN 26*  CREATININE 1.50*  GLUCOSE 110*  CALCIUM  10.0   No results for input(s): LABPT, INR in the last 72 hours.  Review of Systems: ROS as detailed in HPI  Physical Exam: Body mass index is 20.92 kg/m.  Physical Exam   Gen: AAOx3, NAD Comfortable at  rest  Right Lower Extremity: Skin intact TTP over proximal thigh ADF/APF/EHL 5/5 SILT throughout DP, PT 2+ to palp CR < 2s   Assessment and Plan: Ortho consult for right displaced femoral neck fracture, DOI 10/30/23  -history, exam and imaging reviewed at length with patient at bedside -plan for right THA tomorrow with Dr. Donnice Car who has kindly agreed to help manage his care -NPO from midnight and hold VTE ppx -last dose Eliquis  10/18 AM -TDWB RLE pending surgery -PT/OT postop  Lillia Mountain, MD Orthopaedic Surgeon [facility] [phone]  The risks and benefits were presented and reviewed. The risks due to hardware failure/irritation, prosthetic joint dislocation, new/persistent/recurrent infection, stiffness, nerve/vessel/tendon injury, nonunion/malunion of any fracture, wound healing issues, allograft usage, development of arthritis, failure of this surgery, possibility of external fixation in certain situations, possibility of delayed definitive surgery, need for further surgery, prolonged wound care including further soft tissue coverage procedures, thromboembolic events, anesthesia/medical complications/events perioperatively and beyond, amputation, death among others were discussed. The patient acknowledged the explanation, agreed to proceed with the plan.

## 2023-10-30 NOTE — Care Plan (Signed)
 Orthopaedic Surgery Plan of Care Note   -history and imaging reviewed with requesting team (ER) -pt has right displaced femoral neck fracture sustained today 10/30/23 when he tripped after running a 10K -PMH includes HTN, HLD, T2DM, NSVT, CAD, mild AS, SSS s/p PPM 2018, and atrial fibrillation who presents for consultation in the [facility] -admit to Hospitalist team -please keep NPO and hold VTE ppx from midnight (last dose Eliquis  Sat AM per ER) -full consult note to follow   Lillia Mountain, MD Orthopaedic Surgery [facility]

## 2023-10-30 NOTE — H&P (Signed)
 History and Physical    Patient: Travis [name] FMW:[id number] DOB: 1940/03/16 DOA: 10/30/2023 DOS: the patient was seen and examined on 10/30/2023 . PCP: Tisovec, Richard W, MD  Patient coming from: [employer] [address] [employer].  Chief complaint: Chief Complaint  Patient presents with   Fall   HPI:  Travis [name] is a 83 y.o. male with past medical history  of sick sinus syndrome status post pacemaker/paroxysmal A-fib on Eliquis , CAD, CKD stage IIIa, diabetes mellitus type 2, glaucoma coming to us  as a trauma case after sustaining a fall towards the end of his 10K race patient fell on his right side and hurt his right hip and right elbow with multiple bruises and lacerations.   Patient also found to have a right femoral neck fracture with mild displacement.  ED APP provider has contacted orthopedics and is planned for tentative surgery in a.m. n.p.o. after midnight. ED Course:  Vital signs in the ED were notable for the following:  Vitals:   10/30/23 1200 10/30/23 1245 10/30/23 1307 10/30/23 1400  BP: 130/64 (!) 143/76  (!) 151/69  Pulse: 77 72  85  Temp:   (!) 97.4 F (36.3 C)   Resp: 20 (!) 22  16  Height:      Weight:      SpO2: 100% 100%  100%  TempSrc:   Oral   BMI (Calculated):       >>ED evaluation thus far shows: CMP shows potassium of 5.2 bicarb of 17 anion gap of 18 lactic acid or creatinine pending, glucose 110 BUN of 26 creatinine 1.50 eGFR 46 normal LFTs total bili 2.0. CBC does show mild anemia with a hemoglobin of 11.9 white count of 10.9, platelets of 244. CT head and C-spine shows stable age-related cerebral atrophy no acute intracranial findings or skull fractures and normal alignment of the cervical vertebral bodies and no acute fracture.  Next EKG shows sinus rhythm at 75 PR 131 QTc 410 low voltage in lead I upright axis no ST-T wave changes. Chest x-ray done today shows a large hiatal hernia no definite rib fractures no acute cardiopulmonary findings pacer wires are  stable. X-ray of right humerus is negative for any fractures. Hip x-ray shows mildly displaced right femoral neck fracture.   >>While in the ED patient received the following: Medications  fentaNYL  (SUBLIMAZE ) injection 50 mcg (50 mcg Intravenous Given 10/30/23 1304)  lactated ringers bolus 500 mL (500 mLs Intravenous New Bag/Given 10/30/23 1348)   Review of Systems  Musculoskeletal:  Positive for falls and joint pain.   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   SSS (sick sinus syndrome) (HCC) 06/10/2016   Type II diabetes mellitus (HCC)    Past Surgical History:  Procedure Laterality Date   APPENDECTOMY     CARDIAC CATHETERIZATION  06/10/2016   CARDIOVERSION N/A 10/01/2023   Procedure: CARDIOVERSION;  Surgeon: Francyne Headland, MD;  Location: [facility] INVASIVE CV [facility];  Service: Cardiovascular;  Laterality: N/A;   CATARACT EXTRACTION W/ INTRAOCULAR LENS IMPLANT Right    CYSTOSCOPY W/ STONE MANIPULATION     INSERT / REPLACE / REMOVE PACEMAKER  06/10/2016   dual chamber permanent pacemaker   KNEE ARTHROSCOPY W/ MENISCECTOMY  Bilateral    LAPAROSCOPIC CHOLECYSTECTOMY     LEFT HEART CATH AND CORONARY ANGIOGRAPHY N/A 06/10/2016   Procedure: Left Heart Cath and Coronary Angiography;  Surgeon: Cherrie Toribio SAUNDERS, MD;  Location: [facility] INVAS[facility] CV LAB;  Service: Cardiovascular;  Laterality: N/A;   LITHOTRIPSY     PACEMAKER IMPLANT N/A 06/10/2016   Procedure: Pacemaker Implant;  Surgeon: Francyne Headland, MD;  Location: [facility] INVASIVE CV LAB;  Service: Cardiovascular;  Laterality: N/A;   TONSILLECTOMY      reports that he quit smoking about 42 years ago. His smoking use included cigarettes. He started smoking about 67 years ago. He has a 12.5 pack-year  smoking history. He has never used smokeless tobacco. He reports current alcohol use. He reports that he does not use drugs. No Known Allergies Family History  Problem Relation Age of Onset   Colon cancer Father    Prior to Admission medications   Medication Sig Start Date End Date Taking? Authorizing Provider  amLODipine  (NORVASC ) 5 MG tablet Take 1 tablet (5 mg total) by mouth daily. 06/10/23   Croitoru, Mihai, MD  apixaban  (ELIQUIS ) 5 MG TABS tablet Take 1 tablet (5 mg total) by mouth 2 (two) times daily. 10/15/23   Terra Fairy PARAS, PA-C  atorvastatin  (LIPITOR) 40 MG tablet Take 1 tablet (40 mg total) by mouth daily. 08/20/22   Burnard Debby LABOR, MD  Blood Glucose Monitoring Suppl (ONETOUCH VERIO FLEX SYSTEM) w/Device KIT Use to test blood sugars 2-3x a week 04/15/18   [provider]  brimonidine -timolol  (COMBIGAN ) 0.2-0.5 % ophthalmic solution Place 1 drop into both eyes 2 (two) times daily. 06/14/23     Cholecalciferol (VITAMIN D3) 2000 units capsule Take 2,000 Units by mouth daily.    [provider]  ezetimibe  (ZETIA ) 10 MG tablet Take 1 tablet (10 mg total) by mouth daily. 03/30/23   Burnard Debby LABOR, MD  glucose blood (ONETOUCH VERIO) test strip Use to test blood sugars 2-3x a week E11.9 04/15/18   [provider]  hydrochlorothiazide  (MICROZIDE ) 12.5 MG capsule Take 1 capsule (12.5 mg total) by mouth daily. 02/05/23   Croitoru, Mihai, MD  lisinopril  (ZESTRIL ) 20 MG tablet Take 1 tablet (20 mg total) by mouth daily. 04/14/23   Croitoru, Mihai, MD  metFORMIN  (GLUCOPHAGE ) 500 MG tablet Take 1 tablet (500 mg total) by mouth 2 (two) times daily. 12/07/22                                                                                    Vitals:   10/30/23 1200 10/30/23 1245 10/30/23 1307 10/30/23 1400  BP: 130/64 (!) 143/76  (!) 151/69  Pulse: 77 72  85  Resp: 20 (!) 22  16  Temp:   (!) 97.4 F (36.3 C)   TempSrc:   Oral   SpO2: 100% 100%  100%  Weight:      Height:        Physical Exam Vitals reviewed.  Constitutional:      General: He is not in acute distress.    Appearance: He is not ill-appearing.  HENT:     Head: Normocephalic.  Eyes:     Extraocular  Movements: Extraocular movements intact.  Cardiovascular:     Rate and Rhythm: Normal rate and regular rhythm.     Heart sounds: Normal heart sounds.  Pulmonary:     Breath sounds: Normal breath sounds.  Abdominal:     General: There is no distension.     Palpations: Abdomen is soft.     Tenderness: There is no abdominal tenderness.  Musculoskeletal:        General: Tenderness present.  Neurological:     General: No focal deficit present.     Mental Status: He is alert and oriented to person, place, and time.     Labs on Admission: I have personally reviewed following labs and imaging studies CBC: Recent Labs  Lab 10/30/23 1131  WBC 10.9*  NEUTROABS 9.0*  HGB 11.9*  HCT 34.5*  MCV 95.6  PLT 244   Basic Metabolic Panel: Recent Labs  Lab 10/30/23 1131  NA 141  K 5.2*  CL 106  CO2 17*  GLUCOSE 110*  BUN 26*  CREATININE 1.50*  CALCIUM  10.0   GFR: Estimated Creatinine Clearance: 35.3 mL/min (A) (by C-G formula based on SCr of 1.5 mg/dL (H)). Liver Function Tests: Recent Labs  Lab 10/30/23 1131  AST 33  ALT 22  ALKPHOS 48  BILITOT 2.0*  PROT 6.3*  ALBUMIN 4.1   No results for input(s): LIPASE, AMYLASE in the last 168 hours. No results for input(s): AMMONIA in the last 168 hours. Recent Labs    09/01/23 1142 10/15/23 1210 10/30/23 1131  BUN 25 23 26*  CREATININE 1.52* 1.38* 1.50*    Cardiac Enzymes: No results for input(s): CKTOTAL, CKMB, CKMBINDEX, TROPONINI in the last 168 hours. BNP (last 3 results) No results for input(s): PROBNP in the last 8760 hours. HbA1C: No results for input(s): HGBA1C in the last 72 hours. CBG: No results for input(s): GLUCAP in the last 168 hours. Lipid Profile: No results for input(s): CHOL, HDL,  LDLCALC, TRIG, CHOLHDL, LDLDIRECT in the last 72 hours. Thyroid Function Tests: No results for input(s): TSH, T4TOTAL, FREET4, T3FREE, THYROIDAB in the last 72 hours. Anemia Panel: No results for input(s): VITAMINB12, FOLATE, FERRITIN, TIBC, IRON, RETICCTPCT in the last 72 hours. Urine analysis: No results found for: COLORURINE, APPEARANCEUR, LABSPEC, PHURINE, GLUCOSEU, HGBUR, BILIRUBINUR, KETONESUR, PROTEINUR, UROBILINOGEN, NITRITE, LEUKOCYTESUR Radiological Exams on Admission: DG Humerus Right Result Date: 10/30/2023 CLINICAL DATA:  Abrasion right arm after tripping and falling. EXAM: RIGHT HUMERUS - 2+ VIEW COMPARISON:  None Available. FINDINGS: There is no evidence of fracture or other focal bone lesions. Soft tissues are unremarkable. IMPRESSION: Negative. Electronically Signed   By: Camellia Candle M.D.   On: 10/30/2023 13:29   DG HIP UNILAT WITH PELVIS 2-3 VIEWS RIGHT Result Date: 10/30/2023 CLINICAL DATA:  [name] today while running a race. EXAM: DG HIP (WITH OR WITHOUT PELVIS) 2-3V RIGHT COMPARISON:  None Available. FINDINGS: There is a mildly displaced femoral neck fracture. The bony pelvis is intact. The left hip is intact. IMPRESSION: Mildly displaced right femoral neck fracture. Electronically Signed   By: MYRTIS Stammer M.D.   On: 10/30/2023 12:15   CT Head Wo Contrast Result Date: 10/30/2023 CLINICAL DATA:  [name].  Hit head. EXAM: CT HEAD WITHOUT CONTRAST CT CERVICAL SPINE WITHOUT CONTRAST TECHNIQUE: Multidetector CT imaging of the head and cervical spine was performed following the standard protocol without intravenous contrast. Multiplanar CT image reconstructions of the cervical spine were also generated. RADIATION DOSE REDUCTION: This exam was performed according to the departmental  dose-optimization program which includes automated exposure control, adjustment of the mA and/or kV according to patient size and/or use of iterative  reconstruction technique. COMPARISON:  Prior head CT from 10/11/2022 FINDINGS: CT HEAD FINDINGS Brain: Stable age related cerebral atrophy, ventriculomegaly and periventricular white matter disease. No extra-axial fluid collections are identified. No CT findings for acute hemispheric infarction or intracranial hemorrhage. No mass lesions. The brainstem and cerebellum are normal. Vascular: Stable vascular calcifications. No aneurysm or hyperdense vessels. Skull: No skull fracture or bone lesions. Sinuses/Orbits: The paranasal sinuses and mastoid air cells are clear. Stable leftward deviation of the bony nasal septum. The globes are intact. Other: No scalp lesions or scalp hematoma. CT CERVICAL SPINE FINDINGS Alignment: Grossly normal. Mild degenerative anterior subluxation of C7 compared to T1. Skull base and vertebrae: No acute fracture. No primary bone lesion or focal pathologic process. Soft tissues and spinal canal: No prevertebral fluid or swelling. No visible canal hematoma. Disc levels: The spinal canal is fairly generous. No large disc protrusions or canal stenosis. Mild uncinate spurring and facet disease but no significant foraminal stenosis. Upper chest: The lung apices are grossly clear. Other: No neck mass or adenopathy or hematoma. Extensive bilateral carotid artery calcifications. IMPRESSION: 1. Stable age related cerebral atrophy, ventriculomegaly and periventricular white matter disease. 2. No acute intracranial findings or skull fracture. 3. Normal alignment of the cervical vertebral bodies and no acute fracture. Electronically Signed   By: MYRTIS Stammer M.D.   On: 10/30/2023 12:14   CT Cervical Spine Wo Contrast Result Date: 10/30/2023 CLINICAL DATA:  [name].  Hit head. EXAM: CT HEAD WITHOUT CONTRAST CT CERVICAL SPINE WITHOUT CONTRAST TECHNIQUE: Multidetector CT imaging of the head and cervical spine was performed following the standard protocol without intravenous contrast. Multiplanar CT image  reconstructions of the cervical spine were also generated. RADIATION DOSE REDUCTION: This exam was performed according to the departmental dose-optimization program which includes automated exposure control, adjustment of the mA and/or kV according to patient size and/or use of iterative reconstruction technique. COMPARISON:  Prior head CT from 10/11/2022 FINDINGS: CT HEAD FINDINGS Brain: Stable age related cerebral atrophy, ventriculomegaly and periventricular white matter disease. No extra-axial fluid collections are identified. No CT findings for acute hemispheric infarction or intracranial hemorrhage. No mass lesions. The brainstem and cerebellum are normal. Vascular: Stable vascular calcifications. No aneurysm or hyperdense vessels. Skull: No skull fracture or bone lesions. Sinuses/Orbits: The paranasal sinuses and mastoid air cells are clear. Stable leftward deviation of the bony nasal septum. The globes are intact. Other: No scalp lesions or scalp hematoma. CT CERVICAL SPINE FINDINGS Alignment: Grossly normal. Mild degenerative anterior subluxation of C7 compared to T1. Skull base and vertebrae: No acute fracture. No primary bone lesion or focal pathologic process. Soft tissues and spinal canal: No prevertebral fluid or swelling. No visible canal hematoma. Disc levels: The spinal canal is fairly generous. No large disc protrusions or canal stenosis. Mild uncinate spurring and facet disease but no significant foraminal stenosis. Upper chest: The lung apices are grossly clear. Other: No neck mass or adenopathy or hematoma. Extensive bilateral carotid artery calcifications. IMPRESSION: 1. Stable age related cerebral atrophy, ventriculomegaly and periventricular white matter disease. 2. No acute intracranial findings or skull fracture. 3. Normal alignment of the cervical vertebral bodies and no acute fracture. Electronically Signed   By: MYRTIS Stammer M.D.   On: 10/30/2023 12:14   DG Chest Portable 1  View Result Date: 10/30/2023 CLINICAL DATA:  [name] during a 10 K race.  EXAM: PORTABLE CHEST 1 VIEW COMPARISON:  06/11/2016 FINDINGS: The pacer wires are stable. The cardiac silhouette, mediastinal and hilar contours are normal. Stable mild tortuosity and calcification of the thoracic aorta. Moderate to large hiatal hernia again noted. The lungs are clear. No pulmonary contusion, pleural effusion or pneumothorax. The bony pelvis is intact.  No definite rib fractures. IMPRESSION: 1. No acute cardiopulmonary findings. 2. Moderate to large hiatal hernia. 3. No definite rib fractures. Electronically Signed   By: MYRTIS Stammer M.D.   On: 10/30/2023 12:05   Data Reviewed: Relevant notes from primary care and specialist visits, past discharge summaries as available in EHR, including Care Everywhere . Prior diagnostic testing as pertinent to current admission diagnoses, Updated medications and problem lists for reconciliation .ED course, including vitals, labs, imaging, treatment and response to treatment,Triage notes, nursing and pharmacy notes and ED provider's notes.Notable results as noted in HPI.Discussed case with EDMD/ ED APP/ or Specialty MD on call and as needed.  Assessment & Plan    >> Right femoral neck fracture(mildly displaced): Will admit to med/tele unit, n.p.o. after midnight, gentle IV fluid hydration, pain control and titration. Additional measures and PT recs per orthopedic.   >> Diabetes mellitus type 2 Glycemic protocol with Accu-Cheks every 4 hourly for the next 24 hours until the patient is postoperative and has a diet started. Will also obtain an A1c to further make recommendations to optimize glucose control.  >> Glaucoma: Continue Combigan    >> Essential hypertension Vitals:   10/30/23 1123 10/30/23 1130 10/30/23 1200 10/30/23 1245  BP: 108/66 117/80 130/64 (!) 143/76   10/30/23 1400  BP: (!) 151/69  Blood pressure fluctuation noted currently patient is on HCTZ at home  12.5, lisinopril  20, amlodipine  5. Will currently continue with amlodipine  5 mg with as needed hydralazine IV.   >> PAF Regular rhythm, hold Eliquis .   DVT prophylaxis:  Eliquis  Consults:  Orthopedic  Advance Care Planning:    Code Status: Full Code   Family Communication:  None Disposition Plan:  Rehab Severity of Illness: The appropriate patient status for this patient is INPATIENT. Inpatient status is judged to be reasonable and necessary in order to provide the required intensity of service to ensure the patient's safety. The patient's presenting symptoms, physical exam findings, and initial radiographic and laboratory data in the context of their chronic comorbidities is felt to place them at high risk for further clinical deterioration. Furthermore, it is not anticipated that the patient will be medically stable for discharge from the hospital within 2 midnights of admission.   * I certify that at the point of admission it is my clinical judgment that the patient will require inpatient hospital care spanning beyond 2 midnights from the point of admission due to high intensity of service, high risk for further deterioration and high frequency of surveillance required.*  Unresulted Labs (From admission, onward)     Start     Ordered   10/31/23 0500  Basic metabolic panel  Tomorrow morning,   R        10/30/23 1430   10/31/23 0500  CBC  Tomorrow morning,   R        10/30/23 1430   10/30/23 1445  ABO/Rh  Once,   R        10/30/23 1445   10/30/23 1425  Type and screen [facility]  Once,   R       Comments: [facility]  10/30/23 1430            Meds ordered this encounter  Medications   fentaNYL  (SUBLIMAZE ) injection 50 mcg   lactated ringers bolus 500 mL   atorvastatin  (LIPITOR) tablet 40 mg   brimonidine -timolol  (COMBIGAN ) 0.2-0.5 % ophthalmic solution 1 drop   HYDROcodone-acetaminophen  (NORCO/VICODIN) 5-325 MG per tablet 1-2  tablet    Refill:  0   morphine (PF) 2 MG/ML injection 2 mg   OR Linked Order Group    methocarbamol (ROBAXIN) tablet 500 mg    methocarbamol (ROBAXIN) injection 500 mg   docusate sodium (COLACE) capsule 100 mg   polyethylene glycol (MIRALAX / GLYCOLAX) packet 17 g   bisacodyl (DULCOLAX) EC tablet 5 mg   0.9 %  sodium chloride  infusion   hydrALAZINE (APRESOLINE) injection 5 mg   pantoprazole (PROTONIX) injection 40 mg     Orders Placed This Encounter  Procedures   CT Head Wo Contrast   CT Cervical Spine Wo Contrast   DG Chest Portable 1 View   DG Humerus Right   DG HIP UNILAT WITH PELVIS 2-3 VIEWS RIGHT   CBC with Differential   Comprehensive metabolic panel   Basic metabolic panel   CBC   Diet NPO time specified   SCDs   Vital signs every hour x 4, then every 4 hours   Neurovascular checks every hour x 4, then every 4 hours   Bed with an overhead patient helper or overhead frame with a trapeze bar   Apply ice to affected area   Elevate heels off of bed   Turn cough deep breathe   Incentive spirometry   Bed rest with HOB to 30 degrees   Intake and output   If diabetic or glucose greater than 140mg /dl, notify physician to place Gylcemic Control (SSI) Order Set   Initiate Oral Care Protocol   Initiate Carrier Fluid Protocol   Document Pasero Opioid-Induced Sedation Scale (POSS) per protocol (see sidebar report)   Measure blood pressure   Cardiac Monitoring Continuous x 48 hours Indications for use: Other; Other indications for use: A.fib.   Full code   Consult to orthopedic surgery   Consult to hospitalist   Consult to Transition of Care Team   Consult to Registered Dietitian   Consult to anesthesiology for nerve block Laterality: Right; Nerve block will be performed in PACU.   monitoring by pharmacy   Oxygen therapy Mode or (Route): Nasal cannula; Liters Per Minute: 2   Pulse oximetry, continuous   Pulse oximetry, every 4 hours   I-Stat CG4 Lactic Acid   EKG  12-Lead   EKG 12-Lead   Type and screen [facility]   ABO/Rh   Admit to Inpatient (patient's expected length of stay will be greater than 2 midnights or inpatient only procedure)   Fall precautions    Author: Mario LULLA Blanch, MD 12 pm- 8 pm. [facility]. 10/30/2023 3:06 PM Please note for any communication after hours contact [facility] Assigned provider on call on Amion.

## 2023-10-30 NOTE — Plan of Care (Signed)
  Problem: Education: Goal: Knowledge of General Education information will improve Description: Including pain rating scale, medication(s)/side effects and non-pharmacologic comfort measures Outcome: Progressing   Problem: Clinical Measurements: Goal: Ability to maintain clinical measurements within normal limits will improve Outcome: Progressing Goal: Will remain free from infection Outcome: Progressing   Problem: Nutrition: Goal: Adequate nutrition will be maintained Outcome: Progressing   Problem: Pain Managment: Goal: General experience of comfort will improve and/or be controlled Outcome: Progressing   Problem: Skin Integrity: Goal: Risk for impaired skin integrity will decrease Outcome: Progressing

## 2023-10-31 ENCOUNTER — Inpatient Hospital Stay (HOSPITAL_COMMUNITY)

## 2023-10-31 ENCOUNTER — Encounter (HOSPITAL_COMMUNITY): Payer: Self-pay | Admitting: Internal Medicine

## 2023-10-31 ENCOUNTER — Encounter (HOSPITAL_COMMUNITY)
Admission: EM | Disposition: A | Discharge status: Home Health | Payer: Self-pay | Source: Home / Self Care | Attending: Internal Medicine

## 2023-10-31 DIAGNOSIS — Z87891 Personal history of nicotine dependence: Secondary | ICD-10-CM | POA: Diagnosis not present

## 2023-10-31 DIAGNOSIS — S72001A Fracture of unspecified part of neck of right femur, initial encounter for closed fracture: Secondary | ICD-10-CM

## 2023-10-31 DIAGNOSIS — I2510 Atherosclerotic heart disease of native coronary artery without angina pectoris: Secondary | ICD-10-CM

## 2023-10-31 DIAGNOSIS — S72001D Fracture of unspecified part of neck of right femur, subsequent encounter for closed fracture with routine healing: Secondary | ICD-10-CM

## 2023-10-31 DIAGNOSIS — Z96641 Presence of right artificial hip joint: Secondary | ICD-10-CM

## 2023-10-31 DIAGNOSIS — E875 Hyperkalemia: Secondary | ICD-10-CM | POA: Diagnosis not present

## 2023-10-31 HISTORY — PX: TOTAL HIP ARTHROPLASTY: SHX124

## 2023-10-31 LAB — CBC
HCT: 35.8 % — ABNORMAL LOW (ref 39.0–52.0)
Hemoglobin: 12.3 g/dL — ABNORMAL LOW (ref 13.0–17.0)
MCH: 33.2 pg (ref 26.0–34.0)
MCHC: 34.4 g/dL (ref 30.0–36.0)
MCV: 96.5 fL (ref 80.0–100.0)
Platelets: 210 K/uL (ref 150–400)
RBC: 3.71 MIL/uL — ABNORMAL LOW (ref 4.22–5.81)
RDW: 11.9 % (ref 11.5–15.5)
WBC: 11.1 K/uL — ABNORMAL HIGH (ref 4.0–10.5)
nRBC: 0 % (ref 0.0–0.2)

## 2023-10-31 LAB — BASIC METABOLIC PANEL WITH GFR
Anion gap: 12 (ref 5–15)
BUN: 22 mg/dL (ref 8–23)
CO2: 20 mmol/L — ABNORMAL LOW (ref 22–32)
Calcium: 9.3 mg/dL (ref 8.9–10.3)
Chloride: 104 mmol/L (ref 98–111)
Creatinine, Ser: 1.22 mg/dL (ref 0.61–1.24)
GFR, Estimated: 58 mL/min — ABNORMAL LOW (ref >60)
Glucose, Bld: 140 mg/dL — ABNORMAL HIGH (ref 70–99)
Potassium: 4.2 mmol/L (ref 3.5–5.1)
Sodium: 136 mmol/L (ref 135–145)

## 2023-10-31 LAB — GLUCOSE, CAPILLARY
Glucose-Capillary: 122 mg/dL — ABNORMAL HIGH (ref 70–99)
Glucose-Capillary: 126 mg/dL — ABNORMAL HIGH (ref 70–99)
Glucose-Capillary: 128 mg/dL — ABNORMAL HIGH (ref 70–99)
Glucose-Capillary: 130 mg/dL — ABNORMAL HIGH (ref 70–99)
Glucose-Capillary: 148 mg/dL — ABNORMAL HIGH (ref 70–99)
Glucose-Capillary: 153 mg/dL — ABNORMAL HIGH (ref 70–99)
Glucose-Capillary: 164 mg/dL — ABNORMAL HIGH (ref 70–99)

## 2023-10-31 SURGERY — ARTHROPLASTY, HIP, TOTAL, ANTERIOR APPROACH
Anesthesia: General | Site: Hip | Laterality: Right

## 2023-10-31 MED ORDER — DEXAMETHASONE SOD PHOSPHATE PF 10 MG/ML IJ SOLN
10.0000 mg | Freq: Once | INTRAMUSCULAR | Status: AC
  Administered 2023-11-01: 10 mg via INTRAVENOUS

## 2023-10-31 MED ORDER — ONDANSETRON HCL 4 MG/2ML IJ SOLN
INTRAMUSCULAR | Status: DC | PRN
  Administered 2023-10-31: 4 mg via INTRAVENOUS

## 2023-10-31 MED ORDER — FENTANYL CITRATE (PF) 100 MCG/2ML IJ SOLN
INTRAMUSCULAR | Status: AC
  Filled 2023-10-31: qty 2

## 2023-10-31 MED ORDER — ONDANSETRON HCL 4 MG/2ML IJ SOLN: 4.0000 mg | Freq: Once | INTRAMUSCULAR | Status: DC | PRN

## 2023-10-31 MED ORDER — METOCLOPRAMIDE HCL 5 MG/ML IJ SOLN: 5.0000 mg | Freq: Three times a day (TID) | INTRAMUSCULAR | Status: DC | PRN

## 2023-10-31 MED ORDER — TRANEXAMIC ACID-NACL 1000-0.7 MG/100ML-% IV SOLN
INTRAVENOUS | Status: AC
  Filled 2023-10-31: qty 100

## 2023-10-31 MED ORDER — ACETAMINOPHEN 500 MG PO TABS
1000.0000 mg | ORAL_TABLET | Freq: Four times a day (QID) | ORAL | Status: DC
  Administered 2023-10-31 – 2023-11-02 (×8): 1000 mg via ORAL
  Filled 2023-10-31 (×8): qty 2

## 2023-10-31 MED ORDER — FENTANYL CITRATE (PF) 250 MCG/5ML IJ SOLN
INTRAMUSCULAR | Status: DC | PRN
  Administered 2023-10-31: 100 ug via INTRAVENOUS
  Administered 2023-10-31: 50 ug via INTRAVENOUS

## 2023-10-31 MED ORDER — TRANEXAMIC ACID-NACL 1000-0.7 MG/100ML-% IV SOLN
1000.0000 mg | Freq: Once | INTRAVENOUS | Status: AC
  Administered 2023-10-31: 1000 mg via INTRAVENOUS
  Filled 2023-10-31: qty 100

## 2023-10-31 MED ORDER — HYDROMORPHONE HCL 1 MG/ML IJ SOLN: 0.5000 mg | INTRAMUSCULAR | Status: DC | PRN

## 2023-10-31 MED ORDER — CEFAZOLIN SODIUM-DEXTROSE 2-4 GM/100ML-% IV SOLN
2.0000 g | INTRAVENOUS | Status: AC
  Administered 2023-10-31: 2 g via INTRAVENOUS

## 2023-10-31 MED ORDER — ONDANSETRON HCL 4 MG PO TABS: 4.0000 mg | ORAL_TABLET | Freq: Four times a day (QID) | ORAL | Status: DC | PRN

## 2023-10-31 MED ORDER — SENNA 8.6 MG PO TABS
2.0000 | ORAL_TABLET | Freq: Every day | ORAL | Status: DC
  Administered 2023-10-31 – 2023-11-01 (×2): 17.2 mg via ORAL
  Filled 2023-10-31 (×2): qty 2

## 2023-10-31 MED ORDER — CHLORHEXIDINE GLUCONATE 0.12 % MT SOLN
OROMUCOSAL | Status: AC
  Filled 2023-10-31: qty 15

## 2023-10-31 MED ORDER — VITAMIN D 25 MCG (1000 UNIT) PO TABS
2000.0000 [IU] | ORAL_TABLET | Freq: Every day | ORAL | Status: DC
  Administered 2023-10-31 – 2023-11-02 (×3): 2000 [IU] via ORAL
  Filled 2023-10-31 (×3): qty 2

## 2023-10-31 MED ORDER — CEFAZOLIN SODIUM-DEXTROSE 2-4 GM/100ML-% IV SOLN
2.0000 g | Freq: Four times a day (QID) | INTRAVENOUS | Status: AC
  Administered 2023-10-31 (×2): 2 g via INTRAVENOUS
  Filled 2023-10-31 (×2): qty 100

## 2023-10-31 MED ORDER — EPHEDRINE 5 MG/ML INJ
INTRAVENOUS | Status: AC
  Filled 2023-10-31: qty 5

## 2023-10-31 MED ORDER — ORAL CARE MOUTH RINSE: 15.0000 mL | Freq: Once | OROMUCOSAL | Status: AC

## 2023-10-31 MED ORDER — CHLORHEXIDINE GLUCONATE 4 % EX SOLN: 60.0000 mL | Freq: Once | CUTANEOUS | Status: DC

## 2023-10-31 MED ORDER — APIXABAN 5 MG PO TABS
5.0000 mg | ORAL_TABLET | Freq: Two times a day (BID) | ORAL | Status: DC
  Administered 2023-11-01 – 2023-11-02 (×3): 5 mg via ORAL
  Filled 2023-10-31 (×3): qty 1

## 2023-10-31 MED ORDER — METHOCARBAMOL 500 MG PO TABS
500.0000 mg | ORAL_TABLET | Freq: Four times a day (QID) | ORAL | Status: DC | PRN
  Administered 2023-11-01: 500 mg via ORAL
  Filled 2023-10-31: qty 1

## 2023-10-31 MED ORDER — PHENOL 1.4 % MT LIQD: 1.0000 | OROMUCOSAL | Status: DC | PRN

## 2023-10-31 MED ORDER — INSULIN ASPART 100 UNIT/ML IJ SOLN: 0.0000 [IU] | INTRAMUSCULAR | Status: DC | PRN

## 2023-10-31 MED ORDER — EZETIMIBE 10 MG PO TABS
10.0000 mg | ORAL_TABLET | Freq: Every day | ORAL | Status: DC
  Administered 2023-10-31 – 2023-11-02 (×3): 10 mg via ORAL
  Filled 2023-10-31 (×3): qty 1

## 2023-10-31 MED ORDER — DEXAMETHASONE SOD PHOSPHATE PF 10 MG/ML IJ SOLN: 8.0000 mg | Freq: Once | INTRAMUSCULAR | Status: DC

## 2023-10-31 MED ORDER — AMISULPRIDE (ANTIEMETIC) 5 MG/2ML IV SOLN: 10.0000 mg | Freq: Once | INTRAVENOUS | Status: DC | PRN

## 2023-10-31 MED ORDER — FENTANYL CITRATE (PF) 250 MCG/5ML IJ SOLN
INTRAMUSCULAR | Status: AC
  Filled 2023-10-31: qty 5

## 2023-10-31 MED ORDER — POVIDONE-IODINE 10 % EX SWAB: 2.0000 | Freq: Once | CUTANEOUS | Status: DC

## 2023-10-31 MED ORDER — PHENYLEPHRINE HCL-NACL 20-0.9 MG/250ML-% IV SOLN
INTRAVENOUS | Status: DC | PRN
  Administered 2023-10-31: 25 ug/min via INTRAVENOUS

## 2023-10-31 MED ORDER — POLYETHYLENE GLYCOL 3350 17 G PO PACK
17.0000 g | PACK | Freq: Two times a day (BID) | ORAL | Status: DC
  Administered 2023-10-31 – 2023-11-02 (×3): 17 g via ORAL
  Filled 2023-10-31 (×3): qty 1

## 2023-10-31 MED ORDER — MENTHOL 3 MG MT LOZG: 1.0000 | LOZENGE | OROMUCOSAL | Status: DC | PRN

## 2023-10-31 MED ORDER — TRAMADOL HCL 50 MG PO TABS: 50.0000 mg | ORAL_TABLET | Freq: Four times a day (QID) | ORAL | Status: DC | PRN

## 2023-10-31 MED ORDER — DIPHENHYDRAMINE HCL 12.5 MG/5ML PO ELIX: 12.5000 mg | ORAL_SOLUTION | ORAL | Status: DC | PRN

## 2023-10-31 MED ORDER — TRANEXAMIC ACID-NACL 1000-0.7 MG/100ML-% IV SOLN
1000.0000 mg | INTRAVENOUS | Status: AC
  Administered 2023-10-31: 1000 mg via INTRAVENOUS

## 2023-10-31 MED ORDER — FENTANYL CITRATE (PF) 100 MCG/2ML IJ SOLN
25.0000 ug | INTRAMUSCULAR | Status: DC | PRN
  Administered 2023-10-31: 25 ug via INTRAVENOUS

## 2023-10-31 MED ORDER — PROPOFOL 10 MG/ML IV BOLUS
INTRAVENOUS | Status: AC
  Filled 2023-10-31: qty 20

## 2023-10-31 MED ORDER — BISACODYL 10 MG RE SUPP: 10.0000 mg | Freq: Every day | RECTAL | Status: DC | PRN

## 2023-10-31 MED ORDER — ROCURONIUM BROMIDE 10 MG/ML (PF) SYRINGE
PREFILLED_SYRINGE | INTRAVENOUS | Status: AC
  Filled 2023-10-31: qty 10

## 2023-10-31 MED ORDER — SODIUM CHLORIDE 0.9 % IV SOLN: INTRAVENOUS | Status: DC

## 2023-10-31 MED ORDER — LIDOCAINE 2% (20 MG/ML) 5 ML SYRINGE
INTRAMUSCULAR | Status: DC | PRN
  Administered 2023-10-31: 60 mg via INTRAVENOUS

## 2023-10-31 MED ORDER — SODIUM CHLORIDE 0.9 % IR SOLN
Status: DC | PRN
  Administered 2023-10-31: 1000 mL

## 2023-10-31 MED ORDER — CHLORHEXIDINE GLUCONATE 0.12 % MT SOLN
15.0000 mL | Freq: Once | OROMUCOSAL | Status: AC
  Administered 2023-10-31: 15 mL via OROMUCOSAL

## 2023-10-31 MED ORDER — ROCURONIUM BROMIDE 10 MG/ML (PF) SYRINGE
PREFILLED_SYRINGE | INTRAVENOUS | Status: DC | PRN
  Administered 2023-10-31: 30 mg via INTRAVENOUS
  Administered 2023-10-31: 40 mg via INTRAVENOUS

## 2023-10-31 MED ORDER — ACETAMINOPHEN 10 MG/ML IV SOLN
INTRAVENOUS | Status: AC
  Filled 2023-10-31: qty 100

## 2023-10-31 MED ORDER — ONDANSETRON HCL 4 MG/2ML IJ SOLN
INTRAMUSCULAR | Status: AC
  Filled 2023-10-31: qty 2

## 2023-10-31 MED ORDER — DEXAMETHASONE SOD PHOSPHATE PF 10 MG/ML IJ SOLN
INTRAMUSCULAR | Status: DC | PRN
  Administered 2023-10-31: 5 mg via INTRAVENOUS

## 2023-10-31 MED ORDER — OXYCODONE HCL 5 MG PO TABS: 5.0000 mg | ORAL_TABLET | ORAL | Status: DC | PRN

## 2023-10-31 MED ORDER — CEFAZOLIN SODIUM-DEXTROSE 2-4 GM/100ML-% IV SOLN
INTRAVENOUS | Status: AC
  Filled 2023-10-31: qty 100

## 2023-10-31 MED ORDER — LACTATED RINGERS IV SOLN: INTRAVENOUS | Status: DC

## 2023-10-31 MED ORDER — METOCLOPRAMIDE HCL 5 MG PO TABS: 5.0000 mg | ORAL_TABLET | Freq: Three times a day (TID) | ORAL | Status: DC | PRN

## 2023-10-31 MED ORDER — SUGAMMADEX SODIUM 200 MG/2ML IV SOLN
INTRAVENOUS | Status: DC | PRN
  Administered 2023-10-31: 250 mg via INTRAVENOUS

## 2023-10-31 MED ORDER — METHOCARBAMOL 1000 MG/10ML IJ SOLN: 500.0000 mg | Freq: Four times a day (QID) | INTRAMUSCULAR | Status: DC | PRN

## 2023-10-31 MED ORDER — ALUM & MAG HYDROXIDE-SIMETH 200-200-20 MG/5ML PO SUSP: 30.0000 mL | ORAL | Status: DC | PRN

## 2023-10-31 MED ORDER — EPHEDRINE SULFATE-NACL 50-0.9 MG/10ML-% IV SOSY
PREFILLED_SYRINGE | INTRAVENOUS | Status: DC | PRN
  Administered 2023-10-31: 5 mg via INTRAVENOUS

## 2023-10-31 MED ORDER — PHENYLEPHRINE 80 MCG/ML (10ML) SYRINGE FOR IV PUSH (FOR BLOOD PRESSURE SUPPORT)
PREFILLED_SYRINGE | INTRAVENOUS | Status: AC
  Filled 2023-10-31: qty 10

## 2023-10-31 MED ORDER — ONDANSETRON HCL 4 MG/2ML IJ SOLN: 4.0000 mg | Freq: Four times a day (QID) | INTRAMUSCULAR | Status: DC | PRN

## 2023-10-31 MED ORDER — PROPOFOL 10 MG/ML IV BOLUS
INTRAVENOUS | Status: DC | PRN
  Administered 2023-10-31: 100 mg via INTRAVENOUS

## 2023-10-31 MED ORDER — ACETAMINOPHEN 10 MG/ML IV SOLN: 1000.0000 mg | Freq: Once | INTRAVENOUS | Status: DC | PRN

## 2023-10-31 MED ORDER — LIDOCAINE 2% (20 MG/ML) 5 ML SYRINGE
INTRAMUSCULAR | Status: AC
  Filled 2023-10-31: qty 5

## 2023-10-31 SURGICAL SUPPLY — 55 items
BAG COUNTER SPONGE SURGICOUNT (BAG) ×1 IMPLANT
BALL HIP ARTICU EZE 36 8.5 (Hips) IMPLANT
BLADE CLIPPER SURG (BLADE) IMPLANT
BLADE SAW SGTL 18X1.27X75 (BLADE) ×1 IMPLANT
COVER BACK TABLE 24X17X13 BIG (DRAPES) IMPLANT
COVER SURGICAL LIGHT HANDLE (MISCELLANEOUS) ×1 IMPLANT
CUP ACETBLR 54 OD PINNACLE (Hips) IMPLANT
DERMABOND ADVANCED .7 DNX12 (GAUZE/BANDAGES/DRESSINGS) ×1 IMPLANT
DRAPE C-ARM 42X72 X-RAY (DRAPES) ×1 IMPLANT
DRAPE IMP U-DRAPE 54X76 (DRAPES) ×1 IMPLANT
DRAPE STERI IOBAN 125X83 (DRAPES) ×1 IMPLANT
DRAPE U-SHAPE 47X51 STRL (DRAPES) ×3 IMPLANT
DRESSING AQUACEL AG SP 3.5X10 (GAUZE/BANDAGES/DRESSINGS) IMPLANT
DRSG AQUACEL AG ADV 3.5X10 (GAUZE/BANDAGES/DRESSINGS) ×1 IMPLANT
DRSG TEGADERM 4X4.75 (GAUZE/BANDAGES/DRESSINGS) ×1 IMPLANT
DURAPREP 26ML APPLICATOR (WOUND CARE) ×1 IMPLANT
ELECT BLADE TIP CTD 4 INCH (ELECTRODE) IMPLANT
ELECTRODE REM PT RTRN 9FT ADLT (ELECTROSURGICAL) ×1 IMPLANT
EVACUATOR 1/8 PVC DRAIN (DRAIN) ×1 IMPLANT
FACESHIELD WRAPAROUND OR TEAM (MASK) ×1 IMPLANT
GAUZE SPONGE 2X2 8PLY STRL LF (GAUZE/BANDAGES/DRESSINGS) ×1 IMPLANT
GLOVE BIOGEL PI IND STRL 7.5 (GLOVE) ×3 IMPLANT
GLOVE BIOGEL PI IND STRL 8 (GLOVE) ×1 IMPLANT
GLOVE ECLIPSE 8.0 STRL XLNG CF (GLOVE) ×2 IMPLANT
GLOVE ORTHO TXT STRL SZ7.5 (GLOVE) ×2 IMPLANT
GLOVE SURG ENC MOIS LTX SZ6 (GLOVE) ×1 IMPLANT
GLOVE SURG UNDER POLY LF SZ6.5 (GLOVE) ×2 IMPLANT
GOWN BRE IMP SLV AUR LG STRL (GOWN DISPOSABLE) ×1 IMPLANT
GOWN STRL REIN 3XL XLG LVL4 (GOWN DISPOSABLE) ×1 IMPLANT
GOWN STRL REUS W/ TWL LRG LVL3 (GOWN DISPOSABLE) ×2 IMPLANT
KIT BASIN OR (CUSTOM PROCEDURE TRAY) ×1 IMPLANT
KIT TURNOVER KIT B (KITS) ×1 IMPLANT
LINER NEUTRAL 54X36MM PLUS 4 (Hips) IMPLANT
MANIFOLD NEPTUNE II (INSTRUMENTS) ×1 IMPLANT
PACK TOTAL JOINT (CUSTOM PROCEDURE TRAY) ×1 IMPLANT
PACK UNIVERSAL I (CUSTOM PROCEDURE TRAY) ×1 IMPLANT
PAD ARMBOARD POSITIONER FOAM (MISCELLANEOUS) ×2 IMPLANT
RESTRAINT LIMB HOLDER UNIV (RESTRAINTS) ×1 IMPLANT
RETRACTOR WND ALEXIS 18 MED (MISCELLANEOUS) ×1 IMPLANT
SCREW 6.5MMX30MM (Screw) IMPLANT
SOLN 0.9% NACL 1000 ML (IV SOLUTION) ×1 IMPLANT
SOLN 0.9% NACL POUR BTL 1000ML (IV SOLUTION) ×1 IMPLANT
SOLN STERILE WATER 1000 ML (IV SOLUTION) ×3 IMPLANT
SOLN STERILE WATER BTL 1000 ML (IV SOLUTION) ×3 IMPLANT
SPONGE T-LAP 4X18 ~~LOC~~+RFID (SPONGE) IMPLANT
STAPLER SKIN PROX 35W (STAPLE) ×1 IMPLANT
STEM FEM ACTIS HIGH SZ8 (Stem) IMPLANT
SUCTION TUBE FRAZIER 10FR DISP (SUCTIONS) ×1 IMPLANT
SUT MNCRL AB 4-0 PS2 18 (SUTURE) ×1 IMPLANT
SUT VIC AB 1 CT1 27XBRD ANBCTR (SUTURE) ×4 IMPLANT
SUT VIC AB 2-0 CT1 TAPERPNT 27 (SUTURE) ×2 IMPLANT
SUTURE STRATFX 0 PDS 27 VIOLET (SUTURE) ×1 IMPLANT
TOWEL GREEN STERILE (TOWEL DISPOSABLE) ×1 IMPLANT
TOWEL GREEN STERILE FF (TOWEL DISPOSABLE) ×1 IMPLANT
TRAY FOLEY MTR SLVR 16FR STAT (SET/KITS/TRAYS/PACK) IMPLANT

## 2023-10-31 NOTE — Op Note (Signed)
 NAME:  Travis [name]                ACCOUNT NO.: [id number]      MEDICAL RECORD NO.: [id number]      FACILITY:  [facility]      PHYSICIAN:  [name]  DATE OF BIRTH:  1940/03/22     DATE OF PROCEDURE:  10/31/2023                                 OPERATIVE REPORT         PREOPERATIVE DIAGNOSIS: Right hip femoral neck fracture      POSTOPERATIVE DIAGNOSIS:  Right hip femoral neck fracture      PROCEDURE:  Right total hip replacement through an anterior approach   utilizing DePuy THR system, component size 54 mm pinnacle cup, a size 36+4 neutral   Altrex liner, a size 8 Hi Actis stem with a 36+8.5 Articuleze metal head ball.      SURGEON:  Donnice JONETTA. Car, M.D.      ASSISTANT:  Rosina Calin, PA-C     ANESTHESIA:  General.      SPECIMENS:  None.      COMPLICATIONS:  None.      BLOOD LOSS:  550 cc     DRAINS:  None.      INDICATION OF THE PROCEDURE:  Travis [name] is a 83 y.o. male who was finishing a 10K run and unfortunately stumbled at the finish line.  He had immediate onset of right hip pain.  He is brought to the emergency room where radiographs revealed a femoral neck fracture.  Orthopedics was consulted for management.  I reviewed with the patient and his family the fracture pattern and the need for surgical intervention.  We reviewed that total hip replacement would be performed to manage his femoral neck fracture and minimize need for future surgeries.  Risks include infection DVT dislocation neurovascular injury and the need for future surgeries.  The postoperative course and expectations were reviewed.  Consent was obtained for management of his hip fracture.     PROCEDURE IN DETAIL:  The patient was brought to operative theater.   Once adequate anesthesia, preoperative antibiotics, 2 gm of Ancef , 1 gm of Tranexamic Acid, and 10 mg of Decadron were administered, the patient was positioned supine on the [employer] table.  Once the patient was  safely positioned with adequate padding of boney prominences we predraped out the hip, and used fluoroscopy to confirm orientation of the pelvis.      The right hip was then prepped and draped from proximal iliac crest to   mid thigh with a shower curtain technique.      Time-out was performed identifying the patient, planned procedure, and the appropriate extremity.     An incision was then made 2 cm lateral to the   anterior superior iliac spine extending over the orientation of the   tensor fascia lata muscle and sharp dissection was carried down to the   fascia of the muscle.      The fascia was then incised.  The muscle belly was identified and swept   laterally and retractor placed along the superior neck.  Following   cauterization of the circumflex vessels and removing some pericapsular   fat, a second cobra retractor was placed on the inferior neck.  A T-capsulotomy was made along the  line of the   superior neck to the trochanteric fossa, then extended proximally and   distally.  Tag sutures were placed and the retractors were then placed   intracapsular.  We then identified the trochanteric fossa and   orientation of my neck cut and then made a neck osteotomy with the femur on traction.  The fractured femoral neck segment and femoral head were removed without difficulty.  Traction was let   off and retractors were placed posterior and anterior around the   acetabulum.      The labrum and foveal tissue were debrided.  I began reaming with a 47 mm   reamer and reamed up to 53 mm reamer with good bony bed preparation and a 54 mm  cup was chosen.  The final 54 mm Pinnacle cup was then impacted under fluoroscopy to confirm the depth of penetration and orientation with respect to   Abduction and forward flexion.  A screw was placed into the ilium followed by the hole eliminator.  The final   36+4 neutral Altrex liner was impacted with good visualized rim fit.  The cup was positioned  anatomically within the acetabular portion of the pelvis.      At this point, the femur was rolled to 100 degrees.  Further capsule was   released off the inferior aspect of the femoral neck.  I then   released the superior capsule proximally.  With the leg in a neutral position the hook was placed laterally   along the femur under the vastus lateralis origin and elevated manually and then held in position using the hook attachment on the bed.  The leg was then extended and adducted with the leg rolled to 100   degrees of external rotation.  Retractors were placed along the medial calcar and posteriorly over the greater trochanter.  Once the proximal femur was fully   exposed, I used a box osteotome to set orientation.  I then began   broaching with the starting chili pepper broach and passed this by hand and then broached up to 8.  With the 8 broach in place I chose a high offset neck and did several trial reductions.  The offset was appropriate, leg lengths   appeared to be equal best matched with the +8.5 head ball trial confirmed radiographically.   Given these findings, I went ahead and dislocated the hip, repositioned all   retractors and positioned the right hip in the extended and abducted position.  The final 8 Hi Actis stem was   chosen and it was impacted down to the level of neck cut.  Based on this   and the trial reductions, a final 36+8.5 Articuleze metal head ball was chosen and   impacted onto a clean and dry trunnion, and the hip was reduced.  The   hip had been irrigated throughout the case again at this point.  I did   reapproximate the superior capsular leaflet to the anterior leaflet   using #1 Vicryl.  The fascia of the   tensor fascia lata muscle was then reapproximated using #1 Vicryl and #0 Stratafix sutures.  The   remaining wound was closed with 2-0 Vicryl and running 4-0 Monocryl.   The hip was cleaned, dried, and dressed sterilely using Dermabond and   Aquacel  dressing.  The patient was then brought   to recovery room in stable condition tolerating the procedure well.    PA Patti was present for the entirety  of the case involved from   preoperative positioning, perioperative retractor management, general   facilitation of the case, as well as primary wound closure as assistant.            Donnice CORDOBA Ernie, M.D.        10/31/2023 9:10 AM

## 2023-10-31 NOTE — H&P (View-Only) (Signed)
 Patient ID: Travis [name], male   DOB: 03-Nov-1940, 83 y.o.   MRN: [id number] Asked to take over care for [name] right femoral neck fracture.  He sustained a fracture after running a 10K.  He fell onto his right side with immediate onset of pain and inability bear weight.  He was admitted to the hospital.  He was seen evaluated in the holding area with his daughters.  We reviewed the fracture pattern and indication for arthroplasty.  Risk include infection DVT dislocation neurovascular injury and the need for future surgery.  We reviewed the postop course and expectations.  I would anticipate based on his preoperative activity level that he will be able to go home within a couple days of being in the hospital. Consent was obtained for the benefit of fracture management pain control.  Postoperatively he will be weightbearing as tolerated.  We will resume his Eliquis .  He will be followed up in the office in 2 weeks postoperatively.

## 2023-10-31 NOTE — Plan of Care (Signed)

## 2023-10-31 NOTE — Discharge Instructions (Signed)
 INSTRUCTIONS AFTER JOINT REPLACEMENT   Remove items at home which could result in a fall. This includes throw rugs or furniture in walking pathways ICE to the affected joint every three hours while awake for 30 minutes at a time, for at least the first 3-5 days, and then as needed for pain and swelling.  Continue to use ice for pain and swelling. You may notice swelling that will progress down to the foot and ankle.  This is normal after surgery.  Elevate your leg when you are not up walking on it.   Continue to use the breathing machine you got in the hospital (incentive spirometer) which will help keep your temperature down.  It is common for your temperature to cycle up and down following surgery, especially at night when you are not up moving around and exerting yourself.  The breathing machine keeps your lungs expanded and your temperature down.   DIET:  As you were doing prior to hospitalization, we recommend a well-balanced diet.  DRESSING / WOUND CARE / SHOWERING  Keep the surgical dressing until follow up.  The dressing is water proof, so you can shower without any extra covering.  IF THE DRESSING FALLS OFF or the wound gets wet inside, change the dressing with sterile gauze.  Please use good hand washing techniques before changing the dressing.  Do not use any lotions or creams on the incision until instructed by your surgeon.    ACTIVITY  Increase activity slowly as tolerated, but follow the weight bearing instructions below.   No driving for 6 weeks or until further direction given by your physician.  You cannot drive while taking narcotics.  No lifting or carrying greater than 10 lbs. until further directed by your surgeon. Avoid periods of inactivity such as sitting longer than an hour when not asleep. This helps prevent blood clots.  You may return to work once you are authorized by your doctor.     WEIGHT BEARING   Weight bearing as tolerated with assist device (walker, cane,  etc) as directed, use it as long as suggested by your surgeon or therapist, typically at least 4-6 weeks.   EXERCISES  Results after joint replacement surgery are often greatly improved when you follow the exercise, range of motion and muscle strengthening exercises prescribed by your doctor. Safety measures are also important to protect the joint from further injury. Any time any of these exercises cause you to have increased pain or swelling, decrease what you are doing until you are comfortable again and then slowly increase them. If you have problems or questions, call your caregiver or physical therapist for advice.   Rehabilitation is important following a joint replacement. After just a few days of immobilization, the muscles of the leg can become weakened and shrink (atrophy).  These exercises are designed to build up the tone and strength of the thigh and leg muscles and to improve motion. Often times heat used for twenty to thirty minutes before working out will loosen up your tissues and help with improving the range of motion but do not use heat for the first two weeks following surgery (sometimes heat can increase post-operative swelling).   These exercises can be done on a training (exercise) mat, on the floor, on a table or on a bed. Use whatever works the best and is most comfortable for you.    Use music or television while you are exercising so that the exercises are a pleasant break in your  day. This will make your life better with the exercises acting as a break in your routine that you can look forward to.   Perform all exercises about fifteen times, three times per day or as directed.  You should exercise both the operative leg and the other leg as well.  Exercises include:   Quad Sets - Tighten up the muscle on the front of the thigh (Quad) and hold for 5-10 seconds.   Straight Leg Raises - With your knee straight (if you were given a brace, keep it on), lift the leg to 60  degrees, hold for 3 seconds, and slowly lower the leg.  Perform this exercise against resistance later as your leg gets stronger.  Leg Slides: Lying on your back, slowly slide your foot toward your buttocks, bending your knee up off the floor (only go as far as is comfortable). Then slowly slide your foot back down until your leg is flat on the floor again.  Angel Wings: Lying on your back spread your legs to the side as far apart as you can without causing discomfort.  Hamstring Strength:  Lying on your back, push your heel against the floor with your leg straight by tightening up the muscles of your buttocks.  Repeat, but this time bend your knee to a comfortable angle, and push your heel against the floor.  You may put a pillow under the heel to make it more comfortable if necessary.   A rehabilitation program following joint replacement surgery can speed recovery and prevent re-injury in the future due to weakened muscles. Contact your doctor or a physical therapist for more information on knee rehabilitation.    CONSTIPATION  Constipation is defined medically as fewer than three stools per week and severe constipation as less than one stool per week.  Even if you have a regular bowel pattern at home, your normal regimen is likely to be disrupted due to multiple reasons following surgery.  Combination of anesthesia, postoperative narcotics, change in appetite and fluid intake all can affect your bowels.   YOU MUST use at least one of the following options; they are listed in order of increasing strength to get the job done.  They are all available over the counter, and you may need to use some, POSSIBLY even all of these options:    Drink plenty of fluids (prune juice may be helpful) and high fiber foods Colace 100 mg by mouth twice a day  Senokot for constipation as directed and as needed Dulcolax (bisacodyl), take with full glass of water  Miralax (polyethylene glycol) once or twice a day as  needed.  If you have tried all these things and are unable to have a bowel movement in the first 3-4 days after surgery call either your surgeon or your primary doctor.    If you experience loose stools or diarrhea, hold the medications until you stool forms back up.  If your symptoms do not get better within 1 week or if they get worse, check with your doctor.  If you experience the worst abdominal pain ever or develop nausea or vomiting, please contact the office immediately for further recommendations for treatment.   ITCHING:  If you experience itching with your medications, try taking only a single pain pill, or even half a pain pill at a time.  You can also use Benadryl  over the counter for itching or also to help with sleep.   TED HOSE STOCKINGS:  Use stockings on both  legs until for at least 2 weeks or as directed by physician office. They may be removed at night for sleeping.  MEDICATIONS:  See your medication summary on the "After Visit Summary" that nursing will review with you.  You may have some home medications which will be placed on hold until you complete the course of blood thinner medication.  It is important for you to complete the blood thinner medication as prescribed.  PRECAUTIONS:  If you experience chest pain or shortness of breath - call 911 immediately for transfer to the hospital emergency department.   If you develop a fever greater that 101 F, purulent drainage from wound, increased redness or drainage from wound, foul odor from the wound/dressing, or calf pain - CONTACT YOUR SURGEON.                                                   FOLLOW-UP APPOINTMENTS:  If you do not already have a post-op appointment, please call the office for an appointment to be seen by your surgeon.  Guidelines for how soon to be seen are listed in your "After Visit Summary", but are typically between 1-4 weeks after surgery.  OTHER INSTRUCTIONS:   Knee Replacement:  Do not place pillow  under knee, focus on keeping the knee straight while resting. CPM instructions: 0-90 degrees, 2 hours in the morning, 2 hours in the afternoon, and 2 hours in the evening. Place foam block, curve side up under heel at all times except when in CPM or when walking.  DO NOT modify, tear, cut, or change the foam block in any way.  POST-OPERATIVE OPIOID TAPER INSTRUCTIONS: It is important to wean off of your opioid medication as soon as possible. If you do not need pain medication after your surgery it is ok to stop day one. Opioids include: Codeine, Hydrocodone(Norco, Vicodin), Oxycodone(Percocet, oxycontin) and hydromorphone amongst others.  Long term and even short term use of opiods can cause: Increased pain response Dependence Constipation Depression Respiratory depression And more.  Withdrawal symptoms can include Flu like symptoms Nausea, vomiting And more Techniques to manage these symptoms Hydrate well Eat regular healthy meals Stay active Use relaxation techniques(deep breathing, meditating, yoga) Do Not substitute Alcohol to help with tapering If you have been on opioids for less than two weeks and do not have pain than it is ok to stop all together.  Plan to wean off of opioids This plan should start within one week post op of your joint replacement. Maintain the same interval or time between taking each dose and first decrease the dose.  Cut the total daily intake of opioids by one tablet each day Next start to increase the time between doses. The last dose that should be eliminated is the evening dose.   MAKE SURE YOU:  Understand these instructions.  Get help right away if you are not doing well or get worse.    Thank you for letting us  be a part of your medical care team.  It is a privilege we respect greatly.  We hope these instructions will help you stay on track for a fast and full recovery!     Additional Discharge Instructions   Please get your medications  reviewed and adjusted by your Primary MD.  Please request your Primary MD to go over all Hospital Tests  and Procedure/Radiological results at the follow up, please get all Hospital records sent to your Primary MD by signing hospital release before you go home.  If you had Pneumonia of Lung problems at the Hospital: Please get a 2 view Chest X ray done in approximately 4 weeks after hospital discharge or sooner if instructed by your Primary MD.  If you have Congestive Heart Failure: Please call your Cardiologist or Primary MD anytime you have any of the following symptoms:  1) 3 pound weight gain in 24 hours or 5 pounds in 1 week  2) shortness of breath, with or without a dry hacking cough  3) swelling in the hands, feet or stomach  4) if you have to sleep on extra pillows at night in order to breathe  Follow cardiac low salt diet and 1.5 lit/day fluid restriction.  If you have diabetes Accuchecks 4 times/day, Once in AM empty stomach and then before each meal. Log in all results and show them to your primary doctor at your next visit. If any glucose reading is under 80 or above 300 call your primary MD immediately.  If you have Seizure/Convulsions/Epilepsy: Please do not drive, operate heavy machinery, participate in activities at heights or participate in high speed sports until you have seen by Primary MD or a Neurologist and advised to do so again. Per [place]  DMV statutes, patients with seizures are not allowed to drive until they have been seizure-free for six months.  Use caution when using heavy equipment or power tools. Avoid working on ladders or at heights. Take showers instead of baths. Ensure the water temperature is not too high on the home water heater. Do not go swimming alone. Do not lock yourself in a room alone (i.e. bathroom). When caring for infants or small children, sit down when holding, feeding, or changing them to minimize risk of injury to the child in the  event you have a seizure. Maintain good sleep hygiene. Avoid alcohol.   If you had Gastrointestinal Bleeding: Please ask your Primary MD to check a complete blood count within one week of discharge or at your next visit. Your endoscopic/colonoscopic biopsies that are pending at the time of discharge, will also need to followed by your Primary MD.  Get Medicines reviewed and adjusted. Please take all your medications with you for your next visit with your Primary MD  Please request your Primary MD to go over all hospital tests and procedure/radiological results at the follow up, please ask your Primary MD to get all Hospital records sent to his/her office.  If you experience worsening of your admission symptoms, develop shortness of breath, life threatening emergency, suicidal or homicidal thoughts you must seek medical attention immediately by calling 911 or calling your MD immediately  if symptoms less severe.  You must read complete instructions/literature along with all the possible adverse reactions/side effects for all the Medicines you take and that have been prescribed to you. Take any new Medicines after you have completely understood and accpet all the possible adverse reactions/side effects.   Do not drive or operate heavy machinery when taking Pain medications.   Do not take more than prescribed Pain, Sleep and Anxiety Medications  Special Instructions: If you have smoked or chewed Tobacco  in the last 2 yrs please stop smoking, stop any regular Alcohol  and or any Recreational drug use.  Wear Seat belts while driving.  Please note You were cared for by a hospitalist during your hospital  stay. If you have any questions about your discharge medications or the care you received while you were in the hospital after you are discharged, you can call the unit and asked to speak with the hospitalist on call if the hospitalist that took care of you is not available. Once you are discharged,  your primary care physician will handle any further medical issues. Please note that NO REFILLS for any discharge medications will be authorized once you are discharged, as it is imperative that you return to your primary care physician (or establish a relationship with a primary care physician if you do not have one) for your aftercare needs so that they can reassess your need for medications and monitor your lab values.  You can reach the hospitalist office at phone [phone] or fax [phone]   If you do not have a primary care physician, you can call [phone] for a physician referral.

## 2023-10-31 NOTE — Progress Notes (Signed)
 Orthopedic Tech Progress Note Patient Details:  Travis [name] 29-Nov-1940 [id number]  Patient ID: Travis [name], male   DOB: 25-Jul-1940, 83 y.o.   MRN: [id number] Patient exceeds age limit for OHF. Travis [name] 10/31/2023, 12:18 PM

## 2023-10-31 NOTE — Progress Notes (Signed)
 PROGRESS NOTE   Travis [name]  FMW:[id number]    DOB: [birth year]    DOA: 10/30/2023  PCP: Tisovec, Richard W, MD   I have briefly reviewed patients previous medical records in [facility].   Brief Hospital Course:  83 y.o. male with past medical history  of sick sinus syndrome status post pacemaker/paroxysmal A-fib on Eliquis , CAD, CKD stage IIIa, diabetes mellitus type 2, glaucoma sustained a fall towards the end of his 10K race, fell on his right side, hurt his right hip and right elbow with multiple bruises send lacerations.  Admitted for right hip femoral neck fracture.  Orthopedics was consulted and s/p right total hip arthroplasty 10/19.   Assessment & Plan:   Right hip femoral neck fracture S/p mechanical fall Orthopedics consulted.  S/p right total hip arthroplasty by Dr. Ernie 10/19.  550 mL EBL at surgery, follow CBC in AM. Postop care per orthopedics. Right humerus x-ray, CT head and C-spine without acute findings.  PAF Atrial paced rhythm on telemetry. Does not appear to be on rate control medications PTA. Supposed to resume Eliquis  5 mg twice daily on postop day 1, 10/20.  CAD Continue Zetia , statins and Eliquis .  No anginal symptoms.  Stage IIIa CKD Mild hyperkalemia Creatinine has actually improved from 1.5 on admission to 1.22.  Follow BMP in AM. Hyperkalemia resolved.  Type II DM, controlled A1c 6.2 Hold metformin .  Continue SSI.  Hypertension Currently controlled off of meds.  Given significant EBL during surgery, hold antihypertensives for now.  Reassess in AM.  Glaucoma Continue Combigan    Body mass index is 20.92 kg/m.   DVT prophylaxis: SCDs Start: 10/31/23 1207 Place TED hose Start: 10/31/23 1207 SCDs Start: 10/30/23 1424     Code Status: Full Code:  Family Communication: None at bedside. Disposition:  Status is: Inpatient Remains inpatient appropriate because: Immediate postop.     Consultants:   Orthopedics  Procedures:   As  above  Subjective:  Seen soon after he returned to his hospital room from the OR.  Slightly drowsy but easily arousable.  Currently without pain.  Reports some nausea.  Objective:   Vitals:   10/31/23 1130 10/31/23 1145 10/31/23 1205 10/31/23 1351  BP: 127/60 125/61 131/60 113/64  Pulse: 62 (!) 57 (!) 58   Resp: 14 14 14 18   Temp:  (!) 96.3 F (35.7 C) 97.7 F (36.5 C) 97.8 F (36.6 C)  TempSrc:   Axillary Oral  SpO2: 97% 99% 99% 95%  Weight:      Height:        General exam: Elderly male, moderately built and nourished lying comfortably propped up in bed without distress. Respiratory system: Clear to auscultation. Respiratory effort normal. Cardiovascular system: S1 & S2 heard, RRR. No JVD, murmurs, rubs, gallops or clicks. No pedal edema.  Telemetry personally reviewed: Atrial paced rhythm. Gastrointestinal system: Abdomen is nondistended, soft and nontender. No organomegaly or masses felt. Normal bowel sounds heard. Central nervous system: Mental status as noted above. No focal neurological deficits. Extremities: Symmetric 5 x 5 power.  Right hip postop dressing clean and dry. Skin: No rashes, lesions or ulcers Psychiatry: Judgement and insight appear normal. Mood & affect appropriate.     Data Reviewed:   I have personally reviewed following labs and imaging studies   CBC: Recent Labs  Lab 10/30/23 1131 10/31/23 0730  WBC 10.9* 11.1*  NEUTROABS 9.0*  --   HGB 11.9* 12.3*  HCT 34.5* 35.8*  MCV 95.6 96.5  PLT 244 210    Basic Metabolic Panel: Recent Labs  Lab 10/30/23 1131 10/31/23 0730  NA 141 136  K 5.2* 4.2  CL 106 104  CO2 17* 20*  GLUCOSE 110* 140*  BUN 26* 22  CREATININE 1.50* 1.22  CALCIUM  10.0 9.3    Liver Function Tests: Recent Labs  Lab 10/30/23 1131  AST 33  ALT 22  ALKPHOS 48  BILITOT 2.0*  PROT 6.3*  ALBUMIN 4.1    CBG: Recent Labs  Lab 10/31/23 0827 10/31/23 1048 10/31/23 1301  GLUCAP 126* 153* 148*    Microbiology  Studies:   Recent Results (from the past 240 hours)  Surgical pcr screen     Status: None   Collection Time: 10/30/23  4:14 PM   Specimen: Nasal Mucosa; Nasal Swab  Result Value Ref Range Status   MRSA, PCR NEGATIVE NEGATIVE Final   Staphylococcus aureus NEGATIVE NEGATIVE Final    Comment: (NOTE) The Xpert SA Assay (FDA approved for NASAL specimens in patients 7 years of age and older), is one component of a comprehensive surveillance program. It is not intended to diagnose infection nor to guide or monitor treatment. Performed at [facility], [address] [place], [place] [address]     Radiology Studies:  DG Pelvis Portable Result Date: 10/31/2023 CLINICAL DATA:  Status post hip arthroplasty. EXAM: PORTABLE PELVIS 1-2 VIEWS COMPARISON:  Preoperative imaging. FINDINGS: Right hip arthroplasty in expected alignment. No periprosthetic lucency or fracture. Recent postsurgical change includes air and edema in the soft tissues. IMPRESSION: Right hip arthroplasty without immediate postoperative complication. Electronically Signed   By: Andrea Gasman M.D.   On: 10/31/2023 12:45   DG HIP UNILAT WITH PELVIS 1V RIGHT Result Date: 10/31/2023 CLINICAL DATA:  Elective surgery. EXAM: DG HIP (WITH OR WITHOUT PELVIS) 1V RIGHT COMPARISON:  Radiograph yesterday FINDINGS: Eight fluoroscopic spot views of the right hip submitted from the operating room. Sequential images during right hip arthroplasty. Fluoroscopy time 10.2 seconds. Dose 0.9033 mGy. IMPRESSION: Intraoperative fluoroscopy during right hip arthroplasty. Electronically Signed   By: Andrea Gasman M.D.   On: 10/31/2023 11:30   DG C-Arm 1-60 Min-No Report Result Date: 10/31/2023 Fluoroscopy was utilized by the requesting physician.  No radiographic interpretation.   DG C-Arm 1-60 Min-No Report Result Date: 10/31/2023 Fluoroscopy was utilized by the requesting physician.  No radiographic interpretation.   DG Humerus  Right Result Date: 10/30/2023 CLINICAL DATA:  Abrasion right arm after tripping and falling. EXAM: RIGHT HUMERUS - 2+ VIEW COMPARISON:  None Available. FINDINGS: There is no evidence of fracture or other focal bone lesions. Soft tissues are unremarkable. IMPRESSION: Negative. Electronically Signed   By: Camellia Candle M.D.   On: 10/30/2023 13:29   DG HIP UNILAT WITH PELVIS 2-3 VIEWS RIGHT Result Date: 10/30/2023 CLINICAL DATA:  [name] today while running a race. EXAM: DG HIP (WITH OR WITHOUT PELVIS) 2-3V RIGHT COMPARISON:  None Available. FINDINGS: There is a mildly displaced femoral neck fracture. The bony pelvis is intact. The left hip is intact. IMPRESSION: Mildly displaced right femoral neck fracture. Electronically Signed   By: MYRTIS Stammer M.D.   On: 10/30/2023 12:15   CT Head Wo Contrast Result Date: 10/30/2023 CLINICAL DATA:  [name].  Hit head. EXAM: CT HEAD WITHOUT CONTRAST CT CERVICAL SPINE WITHOUT CONTRAST TECHNIQUE: Multidetector CT imaging of the head and cervical spine was performed following the standard protocol without intravenous contrast. Multiplanar CT image reconstructions of the cervical spine were also generated.  RADIATION DOSE REDUCTION: This exam was performed according to the departmental dose-optimization program which includes automated exposure control, adjustment of the mA and/or kV according to patient size and/or use of iterative reconstruction technique. COMPARISON:  Prior head CT from 10/11/2022 FINDINGS: CT HEAD FINDINGS Brain: Stable age related cerebral atrophy, ventriculomegaly and periventricular white matter disease. No extra-axial fluid collections are identified. No CT findings for acute hemispheric infarction or intracranial hemorrhage. No mass lesions. The brainstem and cerebellum are normal. Vascular: Stable vascular calcifications. No aneurysm or hyperdense vessels. Skull: No skull fracture or bone lesions. Sinuses/Orbits: The paranasal sinuses and mastoid air  cells are clear. Stable leftward deviation of the bony nasal septum. The globes are intact. Other: No scalp lesions or scalp hematoma. CT CERVICAL SPINE FINDINGS Alignment: Grossly normal. Mild degenerative anterior subluxation of C7 compared to T1. Skull base and vertebrae: No acute fracture. No primary bone lesion or focal pathologic process. Soft tissues and spinal canal: No prevertebral fluid or swelling. No visible canal hematoma. Disc levels: The spinal canal is fairly generous. No large disc protrusions or canal stenosis. Mild uncinate spurring and facet disease but no significant foraminal stenosis. Upper chest: The lung apices are grossly clear. Other: No neck mass or adenopathy or hematoma. Extensive bilateral carotid artery calcifications. IMPRESSION: 1. Stable age related cerebral atrophy, ventriculomegaly and periventricular white matter disease. 2. No acute intracranial findings or skull fracture. 3. Normal alignment of the cervical vertebral bodies and no acute fracture. Electronically Signed   By: MYRTIS Stammer M.D.   On: 10/30/2023 12:14   CT Cervical Spine Wo Contrast Result Date: 10/30/2023 CLINICAL DATA:  [name].  Hit head. EXAM: CT HEAD WITHOUT CONTRAST CT CERVICAL SPINE WITHOUT CONTRAST TECHNIQUE: Multidetector CT imaging of the head and cervical spine was performed following the standard protocol without intravenous contrast. Multiplanar CT image reconstructions of the cervical spine were also generated. RADIATION DOSE REDUCTION: This exam was performed according to the departmental dose-optimization program which includes automated exposure control, adjustment of the mA and/or kV according to patient size and/or use of iterative reconstruction technique. COMPARISON:  Prior head CT from 10/11/2022 FINDINGS: CT HEAD FINDINGS Brain: Stable age related cerebral atrophy, ventriculomegaly and periventricular white matter disease. No extra-axial fluid collections are identified. No CT findings for  acute hemispheric infarction or intracranial hemorrhage. No mass lesions. The brainstem and cerebellum are normal. Vascular: Stable vascular calcifications. No aneurysm or hyperdense vessels. Skull: No skull fracture or bone lesions. Sinuses/Orbits: The paranasal sinuses and mastoid air cells are clear. Stable leftward deviation of the bony nasal septum. The globes are intact. Other: No scalp lesions or scalp hematoma. CT CERVICAL SPINE FINDINGS Alignment: Grossly normal. Mild degenerative anterior subluxation of C7 compared to T1. Skull base and vertebrae: No acute fracture. No primary bone lesion or focal pathologic process. Soft tissues and spinal canal: No prevertebral fluid or swelling. No visible canal hematoma. Disc levels: The spinal canal is fairly generous. No large disc protrusions or canal stenosis. Mild uncinate spurring and facet disease but no significant foraminal stenosis. Upper chest: The lung apices are grossly clear. Other: No neck mass or adenopathy or hematoma. Extensive bilateral carotid artery calcifications. IMPRESSION: 1. Stable age related cerebral atrophy, ventriculomegaly and periventricular white matter disease. 2. No acute intracranial findings or skull fracture. 3. Normal alignment of the cervical vertebral bodies and no acute fracture. Electronically Signed   By: MYRTIS Stammer M.D.   On: 10/30/2023 12:14   DG Chest Portable 1 View Result Date:  10/30/2023 CLINICAL DATA:  [name] during a 10 K race. EXAM: PORTABLE CHEST 1 VIEW COMPARISON:  06/11/2016 FINDINGS: The pacer wires are stable. The cardiac silhouette, mediastinal and hilar contours are normal. Stable mild tortuosity and calcification of the thoracic aorta. Moderate to large hiatal hernia again noted. The lungs are clear. No pulmonary contusion, pleural effusion or pneumothorax. The bony pelvis is intact.  No definite rib fractures. IMPRESSION: 1. No acute cardiopulmonary findings. 2. Moderate to large hiatal hernia. 3. No  definite rib fractures. Electronically Signed   By: MYRTIS Stammer M.D.   On: 10/30/2023 12:05    Scheduled Meds:    acetaminophen   1,000 mg Oral Q6H   [START ON 11/01/2023] apixaban   5 mg Oral BID   atorvastatin   40 mg Oral Daily   brimonidine   1 drop Both Eyes BID   And   timolol   1 drop Both Eyes BID   [START ON 11/01/2023] dexamethasone (DECADRON) injection  10 mg Intravenous Once   insulin aspart  0-9 Units Subcutaneous Q4H   pantoprazole (PROTONIX) IV  40 mg Intravenous Q12H   polyethylene glycol  17 g Oral BID   senna  2 tablet Oral QHS    Continuous Infusions:    sodium chloride  75 mL/hr at 10/31/23 1217    ceFAZolin  (ANCEF ) IV 2 g (10/31/23 1503)     LOS: 1 day     Trenda Mar, MD,  FACP, [facility], [facility], [facility]   [facility] [place]      To contact the attending provider between 7A-7P or the covering provider during after hours 7P-7A, please log into the web site [email or web address] and access using universal [place] password for that web site. If you do not have the password, please call the hospital operator.  10/31/2023, 3:45 PM

## 2023-10-31 NOTE — Anesthesia Preprocedure Evaluation (Addendum)
 Anesthesia Evaluation  Patient identified by MRN, date of birth, ID band Patient awake    Reviewed: Allergy & Precautions, NPO status , Patient's Chart, lab work & pertinent test results  Airway Mallampati: II  TM Distance: >3 FB Neck ROM: Full    Dental no notable dental hx.    Pulmonary former smoker   Pulmonary exam normal        Cardiovascular hypertension, Pt. on medications + CAD  Normal cardiovascular exam+ dysrhythmias Atrial Fibrillation + pacemaker      Neuro/Psych negative neurological ROS     GI/Hepatic negative GI ROS, Neg liver ROS,,,  Endo/Other  diabetes, Oral Hypoglycemic Agents    Renal/GU Renal disease     Musculoskeletal  (+) Arthritis ,    Abdominal   Peds  Hematology  (+) Blood dyscrasia (Eliquis )   Anesthesia Other Findings RIGHT HIP FRACTURE  Reproductive/Obstetrics                              Anesthesia Physical Anesthesia Plan  ASA: 3  Anesthesia Plan: General   Post-op Pain Management:    Induction: Intravenous  PONV Risk Score and Plan: 2 and Ondansetron , Dexamethasone and Treatment may vary due to age or medical condition  Airway Management Planned: Oral ETT  Additional Equipment:   Intra-op Plan:   Post-operative Plan: Extubation in OR  Informed Consent: I have reviewed the patients History and Physical, chart, labs and discussed the procedure including the risks, benefits and alternatives for the proposed anesthesia with the patient or authorized representative who has indicated his/her understanding and acceptance.     Dental advisory given  Plan Discussed with: CRNA  Anesthesia Plan Comments:         Anesthesia Quick Evaluation

## 2023-10-31 NOTE — Anesthesia Postprocedure Evaluation (Signed)
 Anesthesia Post Note  Patient: Travis [name]  Procedure(s) Performed: RIGHT TOTAL HIP ARTHROPLASTY,  ANTERIOR APPROACH (Right: Hip)     Patient location during evaluation: PACU Anesthesia Type: General Level of consciousness: awake Pain management: pain level controlled Vital Signs Assessment: post-procedure vital signs reviewed and stable Respiratory status: spontaneous breathing, nonlabored ventilation and respiratory function stable Cardiovascular status: blood pressure returned to baseline and stable Postop Assessment: no apparent nausea or vomiting Anesthetic complications: no   No notable events documented.  Last Vitals:  Vitals:   10/31/23 1351 10/31/23 1611  BP: 113/64 (!) 103/56  Pulse:    Resp: 18 17  Temp: 36.6 C (!) 36.4 C  SpO2: 95% 100%    Last Pain:  Vitals:   10/31/23 1611  TempSrc: Axillary  PainSc:                  Bernardino SQUIBB Ryle Buscemi

## 2023-10-31 NOTE — Progress Notes (Signed)
 Patient ID: RAFIK KOPPEL, male   DOB: 03-Nov-1940, 83 y.o.   MRN: [id number] Asked to take over care for [name] right femoral neck fracture.  He sustained a fracture after running a 10K.  He fell onto his right side with immediate onset of pain and inability bear weight.  He was admitted to the hospital.  He was seen evaluated in the holding area with his daughters.  We reviewed the fracture pattern and indication for arthroplasty.  Risk include infection DVT dislocation neurovascular injury and the need for future surgery.  We reviewed the postop course and expectations.  I would anticipate based on his preoperative activity level that he will be able to go home within a couple days of being in the hospital. Consent was obtained for the benefit of fracture management pain control.  Postoperatively he will be weightbearing as tolerated.  We will resume his Eliquis .  He will be followed up in the office in 2 weeks postoperatively.

## 2023-10-31 NOTE — Anesthesia Procedure Notes (Signed)
 Procedure Name: Intubation Date/Time: 10/31/2023 9:27 AM  Performed by: Elby Raelene SAUNDERS, CRNAPre-anesthesia Checklist: Patient identified, Emergency Drugs available, Suction available and Patient being monitored Patient Re-evaluated:Patient Re-evaluated prior to induction Oxygen Delivery Method: Circle System Utilized Preoxygenation: Pre-oxygenation with 100% oxygen Induction Type: IV induction and Cricoid Pressure applied Ventilation: Mask ventilation without difficulty Laryngoscope Size: 2 and Miller Grade View: Grade II Tube type: Oral Tube size: 7.5 mm Number of attempts: 1 Airway Equipment and Method: Stylet and Bite block Placement Confirmation: ETT inserted through vocal cords under direct vision, positive ETCO2 and breath sounds checked- equal and bilateral Secured at: 23 cm Tube secured with: Tape Dental Injury: Teeth and Oropharynx as per pre-operative assessment

## 2023-10-31 NOTE — TOC CAGE-AID Note (Signed)
 Transition of Care [facility]) - CAGE-AID Screening  Patient Details  Name: Travis [name] MRN: [id number] Date of Birth: 1940-05-28  Clinical Narrative:  Patient fell while completing a 10K. Patient endorses occasional alcohol use, denies any illicit substance use. Patient denies need for substance abuse resources at this time.  CAGE-AID Screening:   Have You Ever Felt You Ought to Cut Down on Your Drinking or Drug Use?: No Have People Annoyed You By Critizing Your Drinking Or Drug Use?: No Have You Felt Bad Or Guilty About Your Drinking Or Drug Use?: No Have You Ever Had a Drink or Used Drugs First Thing In The Morning to Steady Your Nerves or to Get Rid of a Hangover?: No CAGE-AID Score: 0  Substance Abuse Education Offered: Yes

## 2023-10-31 NOTE — Interval H&P Note (Signed)
 History and Physical Interval Note:  10/31/2023 9:07 AM  Travis [name]  has presented today for surgery, with the diagnosis of RIGHT HIP FRACTURE.  The various methods of treatment have been discussed with the patient and family. After consideration of risks, benefits and other options for treatment, the patient has consented to  Procedure(s): ARTHROPLASTY, HIP, TOTAL, ANTERIOR APPROACH (Right) as a surgical intervention.  The patient's history has been reviewed, patient examined, no change in status, stable for surgery.  I have reviewed the patient's chart and labs.  Questions were answered to the patient's satisfaction.     [name]

## 2023-10-31 NOTE — Transfer of Care (Signed)
 Immediate Anesthesia Transfer of Care Note  Patient: Travis [name]  Procedure(s) Performed: RIGHT TOTAL HIP ARTHROPLASTY,  ANTERIOR APPROACH (Right: Hip)  Patient Location: PACU  Anesthesia Type:General  Level of Consciousness: awake and alert   Airway & Oxygen Therapy: Patient Spontanous Breathing  Post-op Assessment: Report given to RN and Post -op Vital signs reviewed and stable  Post vital signs: Reviewed and stable  Last Vitals:  Vitals Value Taken Time  BP 132/75 10/31/23 10:47  Temp    Pulse 67 10/31/23 10:49  Resp 18 10/31/23 10:49  SpO2 99 % 10/31/23 10:49  Vitals shown include unfiled device data.  Last Pain:  Vitals:   10/31/23 0815  TempSrc: Oral  PainSc:       Patients Stated Pain Goal: 3 (10/30/23 1655)  Complications: No notable events documented.

## 2023-11-01 ENCOUNTER — Encounter (HOSPITAL_COMMUNITY): Payer: Self-pay | Admitting: Orthopedic Surgery

## 2023-11-01 DIAGNOSIS — S728X1A Other fracture of right femur, initial encounter for closed fracture: Secondary | ICD-10-CM | POA: Diagnosis not present

## 2023-11-01 DIAGNOSIS — D62 Acute posthemorrhagic anemia: Secondary | ICD-10-CM

## 2023-11-01 LAB — BASIC METABOLIC PANEL WITH GFR
Anion gap: 12 (ref 5–15)
BUN: 21 mg/dL (ref 8–23)
CO2: 19 mmol/L — ABNORMAL LOW (ref 22–32)
Calcium: 9 mg/dL (ref 8.9–10.3)
Chloride: 105 mmol/L (ref 98–111)
Creatinine, Ser: 1.21 mg/dL (ref 0.61–1.24)
GFR, Estimated: 59 mL/min — ABNORMAL LOW (ref >60)
Glucose, Bld: 124 mg/dL — ABNORMAL HIGH (ref 70–99)
Potassium: 4.3 mmol/L (ref 3.5–5.1)
Sodium: 136 mmol/L (ref 135–145)

## 2023-11-01 LAB — GLUCOSE, CAPILLARY
Glucose-Capillary: 128 mg/dL — ABNORMAL HIGH (ref 70–99)
Glucose-Capillary: 219 mg/dL — ABNORMAL HIGH (ref 70–99)
Glucose-Capillary: 291 mg/dL — ABNORMAL HIGH (ref 70–99)
Glucose-Capillary: 275 mg/dL — ABNORMAL HIGH (ref 70–99)
Glucose-Capillary: 144 mg/dL — ABNORMAL HIGH (ref 70–99)

## 2023-11-01 LAB — CBC
HCT: 28 % — ABNORMAL LOW (ref 39.0–52.0)
Hemoglobin: 9.7 g/dL — ABNORMAL LOW (ref 13.0–17.0)
MCH: 33.8 pg (ref 26.0–34.0)
MCHC: 34.6 g/dL (ref 30.0–36.0)
MCV: 97.6 fL (ref 80.0–100.0)
Platelets: 191 K/uL (ref 150–400)
RBC: 2.87 MIL/uL — ABNORMAL LOW (ref 4.22–5.81)
RDW: 12.1 % (ref 11.5–15.5)
WBC: 13.8 K/uL — ABNORMAL HIGH (ref 4.0–10.5)
nRBC: 0 % (ref 0.0–0.2)

## 2023-11-01 NOTE — Progress Notes (Signed)
 PROGRESS NOTE   Travis [name]  FMW:[id number]    DOB: 04-30-1940    DOA: 10/30/2023  PCP: Tisovec, Richard W, MD   I have briefly reviewed patients previous medical records in [facility].   Brief Hospital Course:  83 y.o. male with past medical history  of sick sinus syndrome status post pacemaker/paroxysmal A-fib on Eliquis , CAD, CKD stage IIIa, diabetes mellitus type 2, glaucoma sustained a fall towards the end of his 10K race, fell on his right side, hurt his right hip and right elbow with multiple bruises send lacerations.  Admitted for right hip femoral neck fracture.  Orthopedics was consulted and s/p right total hip arthroplasty 10/19.   Assessment & Plan:   Right hip femoral neck fracture S/p mechanical fall Orthopedics consulted.  S/p right total hip arthroplasty by Dr. Ernie 10/19.  550 mL EBL at surgery, follow CBC in AM. Postop care per orthopedics. Right humerus x-ray, CT head and C-spine without acute findings. PT and OT are yet to see, PT is in the room.  PTA, patient lived alone and was quite independent, ran a 10K marathon annually for the last 40 years.  Upon discharge, if cleared by therapies to DC home, he will be staying with his daughter until he recovers from surgery. Improved and stable.  Acute posthemorrhagic anemia Hemoglobin dropped from 12.3 preop to 9.7 postop.  EBL 550 mL. Follow CBC in AM.  PAF Atrial paced rhythm on telemetry. Does not appear to be on rate control medications PTA. Continue PTA apixaban  5 mg twice daily.  CAD Continue Zetia , statins and Eliquis .  No anginal symptoms.  Stage IIIa CKD Mild hyperkalemia Creatinine has actually improved from 1.5 on admission to 1.22-1.21 Hyperkalemia resolved.  Type II DM, controlled A1c 6.2 Hold metformin .  Continue SSI.  Hypertension Currently controlled off of meds.  Given significant EBL during surgery, hold antihypertensives for now.  Reassess in AM.  Glaucoma Continue  Combigan    Body mass index is 20.92 kg/m.   DVT prophylaxis: SCDs Start: 10/31/23 1207 Place TED hose Start: 10/31/23 1207 SCDs Start: 10/30/23 1424     Code Status: Full Code:  Family Communication: 2 daughters at bedside. Disposition:  Status is: Inpatient Remains inpatient appropriate because: Immediate postop.     Consultants:   Orthopedics  Procedures:   As above  Subjective:  Seen this morning along with his 2 daughters and PT person at bedside.  Reports mild 3/10 pain in right hip.  No other complaints reported.  Objective:   Vitals:   10/31/23 1958 10/31/23 2309 11/01/23 0426 11/01/23 0856  BP: (!) 109/51 (!) 100/59 128/64 (!) 115/56  Pulse: 64 (!) 58 66 73  Resp: 15 15 16 16   Temp: 98.2 F (36.8 C) 97.9 F (36.6 C) 98 F (36.7 C) 97.9 F (36.6 C)  TempSrc:   Oral Oral  SpO2: 100% 100% 99% 100%  Weight:      Height:        General exam: Elderly male, moderately built and nourished lying comfortably propped up in bed without distress. Respiratory system: Clear to auscultation. Respiratory effort normal. Cardiovascular system: S1 & S2 heard, RRR. No JVD, murmurs, rubs, gallops or clicks. No pedal edema.  Gastrointestinal system: Abdomen is nondistended, soft and nontender. No organomegaly or masses felt. Normal bowel sounds heard. Central nervous system: Mental status as noted above. No focal neurological deficits. Extremities: Symmetric 5 x 5 power.  Right hip postop dressing clean and dry.  Stable  without change. Skin: No rashes, lesions or ulcers Psychiatry: Judgement and insight appear normal. Mood & affect appropriate.     Data Reviewed:   I have personally reviewed following labs and imaging studies   CBC: Recent Labs  Lab 10/30/23 1131 10/31/23 0730 11/01/23 0634  WBC 10.9* 11.1* 13.8*  NEUTROABS 9.0*  --   --   HGB 11.9* 12.3* 9.7*  HCT 34.5* 35.8* 28.0*  MCV 95.6 96.5 97.6  PLT 244 210 191    Basic Metabolic Panel: Recent Labs   Lab 10/30/23 1131 10/31/23 0730 11/01/23 0634  NA 141 136 136  K 5.2* 4.2 4.3  CL 106 104 105  CO2 17* 20* 19*  GLUCOSE 110* 140* 124*  BUN 26* 22 21  CREATININE 1.50* 1.22 1.21  CALCIUM  10.0 9.3 9.0    Liver Function Tests: Recent Labs  Lab 10/30/23 1131  AST 33  ALT 22  ALKPHOS 48  BILITOT 2.0*  PROT 6.3*  ALBUMIN 4.1    CBG: Recent Labs  Lab 10/31/23 2000 10/31/23 2346 11/01/23 0415  GLUCAP 128* 122* 128*    Microbiology Studies:   Recent Results (from the past 240 hours)  Surgical pcr screen     Status: None   Collection Time: 10/30/23  4:14 PM   Specimen: Nasal Mucosa; Nasal Swab  Result Value Ref Range Status   MRSA, PCR NEGATIVE NEGATIVE Final   Staphylococcus aureus NEGATIVE NEGATIVE Final    Comment: (NOTE) The Xpert SA Assay (FDA approved for NASAL specimens in patients 79 years of age and older), is one component of a comprehensive surveillance program. It is not intended to diagnose infection nor to guide or monitor treatment. Performed at [facility], [address] [place], [place] [address]     Radiology Studies:  DG Pelvis Portable Result Date: 10/31/2023 CLINICAL DATA:  Status post hip arthroplasty. EXAM: PORTABLE PELVIS 1-2 VIEWS COMPARISON:  Preoperative imaging. FINDINGS: Right hip arthroplasty in expected alignment. No periprosthetic lucency or fracture. Recent postsurgical change includes air and edema in the soft tissues. IMPRESSION: Right hip arthroplasty without immediate postoperative complication. Electronically Signed   By: Andrea Gasman M.D.   On: 10/31/2023 12:45   DG HIP UNILAT WITH PELVIS 1V RIGHT Result Date: 10/31/2023 CLINICAL DATA:  Elective surgery. EXAM: DG HIP (WITH OR WITHOUT PELVIS) 1V RIGHT COMPARISON:  Radiograph yesterday FINDINGS: Eight fluoroscopic spot views of the right hip submitted from the operating room. Sequential images during right hip arthroplasty. Fluoroscopy time 10.2 seconds. Dose  0.9033 mGy. IMPRESSION: Intraoperative fluoroscopy during right hip arthroplasty. Electronically Signed   By: Andrea Gasman M.D.   On: 10/31/2023 11:30   DG C-Arm 1-60 Min-No Report Result Date: 10/31/2023 Fluoroscopy was utilized by the requesting physician.  No radiographic interpretation.   DG C-Arm 1-60 Min-No Report Result Date: 10/31/2023 Fluoroscopy was utilized by the requesting physician.  No radiographic interpretation.   DG Humerus Right Result Date: 10/30/2023 CLINICAL DATA:  Abrasion right arm after tripping and falling. EXAM: RIGHT HUMERUS - 2+ VIEW COMPARISON:  None Available. FINDINGS: There is no evidence of fracture or other focal bone lesions. Soft tissues are unremarkable. IMPRESSION: Negative. Electronically Signed   By: Camellia Candle M.D.   On: 10/30/2023 13:29   DG HIP UNILAT WITH PELVIS 2-3 VIEWS RIGHT Result Date: 10/30/2023 CLINICAL DATA:  [name] today while running a race. EXAM: DG HIP (WITH OR WITHOUT PELVIS) 2-3V RIGHT COMPARISON:  None Available. FINDINGS: There is a mildly displaced femoral  neck fracture. The bony pelvis is intact. The left hip is intact. IMPRESSION: Mildly displaced right femoral neck fracture. Electronically Signed   By: MYRTIS Stammer M.D.   On: 10/30/2023 12:15   CT Head Wo Contrast Result Date: 10/30/2023 CLINICAL DATA:  [name].  Hit head. EXAM: CT HEAD WITHOUT CONTRAST CT CERVICAL SPINE WITHOUT CONTRAST TECHNIQUE: Multidetector CT imaging of the head and cervical spine was performed following the standard protocol without intravenous contrast. Multiplanar CT image reconstructions of the cervical spine were also generated. RADIATION DOSE REDUCTION: This exam was performed according to the departmental dose-optimization program which includes automated exposure control, adjustment of the mA and/or kV according to patient size and/or use of iterative reconstruction technique. COMPARISON:  Prior head CT from 10/11/2022 FINDINGS: CT HEAD FINDINGS  Brain: Stable age related cerebral atrophy, ventriculomegaly and periventricular white matter disease. No extra-axial fluid collections are identified. No CT findings for acute hemispheric infarction or intracranial hemorrhage. No mass lesions. The brainstem and cerebellum are normal. Vascular: Stable vascular calcifications. No aneurysm or hyperdense vessels. Skull: No skull fracture or bone lesions. Sinuses/Orbits: The paranasal sinuses and mastoid air cells are clear. Stable leftward deviation of the bony nasal septum. The globes are intact. Other: No scalp lesions or scalp hematoma. CT CERVICAL SPINE FINDINGS Alignment: Grossly normal. Mild degenerative anterior subluxation of C7 compared to T1. Skull base and vertebrae: No acute fracture. No primary bone lesion or focal pathologic process. Soft tissues and spinal canal: No prevertebral fluid or swelling. No visible canal hematoma. Disc levels: The spinal canal is fairly generous. No large disc protrusions or canal stenosis. Mild uncinate spurring and facet disease but no significant foraminal stenosis. Upper chest: The lung apices are grossly clear. Other: No neck mass or adenopathy or hematoma. Extensive bilateral carotid artery calcifications. IMPRESSION: 1. Stable age related cerebral atrophy, ventriculomegaly and periventricular white matter disease. 2. No acute intracranial findings or skull fracture. 3. Normal alignment of the cervical vertebral bodies and no acute fracture. Electronically Signed   By: MYRTIS Stammer M.D.   On: 10/30/2023 12:14   CT Cervical Spine Wo Contrast Result Date: 10/30/2023 CLINICAL DATA:  [name].  Hit head. EXAM: CT HEAD WITHOUT CONTRAST CT CERVICAL SPINE WITHOUT CONTRAST TECHNIQUE: Multidetector CT imaging of the head and cervical spine was performed following the standard protocol without intravenous contrast. Multiplanar CT image reconstructions of the cervical spine were also generated. RADIATION DOSE REDUCTION: This exam  was performed according to the departmental dose-optimization program which includes automated exposure control, adjustment of the mA and/or kV according to patient size and/or use of iterative reconstruction technique. COMPARISON:  Prior head CT from 10/11/2022 FINDINGS: CT HEAD FINDINGS Brain: Stable age related cerebral atrophy, ventriculomegaly and periventricular white matter disease. No extra-axial fluid collections are identified. No CT findings for acute hemispheric infarction or intracranial hemorrhage. No mass lesions. The brainstem and cerebellum are normal. Vascular: Stable vascular calcifications. No aneurysm or hyperdense vessels. Skull: No skull fracture or bone lesions. Sinuses/Orbits: The paranasal sinuses and mastoid air cells are clear. Stable leftward deviation of the bony nasal septum. The globes are intact. Other: No scalp lesions or scalp hematoma. CT CERVICAL SPINE FINDINGS Alignment: Grossly normal. Mild degenerative anterior subluxation of C7 compared to T1. Skull base and vertebrae: No acute fracture. No primary bone lesion or focal pathologic process. Soft tissues and spinal canal: No prevertebral fluid or swelling. No visible canal hematoma. Disc levels: The spinal canal is fairly generous. No large disc protrusions or canal  stenosis. Mild uncinate spurring and facet disease but no significant foraminal stenosis. Upper chest: The lung apices are grossly clear. Other: No neck mass or adenopathy or hematoma. Extensive bilateral carotid artery calcifications. IMPRESSION: 1. Stable age related cerebral atrophy, ventriculomegaly and periventricular white matter disease. 2. No acute intracranial findings or skull fracture. 3. Normal alignment of the cervical vertebral bodies and no acute fracture. Electronically Signed   By: MYRTIS Stammer M.D.   On: 10/30/2023 12:14   DG Chest Portable 1 View Result Date: 10/30/2023 CLINICAL DATA:  [name] during a 10 K race. EXAM: PORTABLE CHEST 1 VIEW  COMPARISON:  06/11/2016 FINDINGS: The pacer wires are stable. The cardiac silhouette, mediastinal and hilar contours are normal. Stable mild tortuosity and calcification of the thoracic aorta. Moderate to large hiatal hernia again noted. The lungs are clear. No pulmonary contusion, pleural effusion or pneumothorax. The bony pelvis is intact.  No definite rib fractures. IMPRESSION: 1. No acute cardiopulmonary findings. 2. Moderate to large hiatal hernia. 3. No definite rib fractures. Electronically Signed   By: MYRTIS Stammer M.D.   On: 10/30/2023 12:05    Scheduled Meds:    acetaminophen   1,000 mg Oral Q6H   apixaban   5 mg Oral BID   atorvastatin   40 mg Oral Daily   brimonidine   1 drop Both Eyes BID   And   timolol   1 drop Both Eyes BID   cholecalciferol  2,000 Units Oral Daily   ezetimibe   10 mg Oral Daily   insulin aspart  0-9 Units Subcutaneous Q4H   pantoprazole (PROTONIX) IV  40 mg Intravenous Q12H   polyethylene glycol  17 g Oral BID   senna  2 tablet Oral QHS    Continuous Infusions:    sodium chloride  75 mL/hr at 10/31/23 1217     LOS: 2 days     Trenda Mar, MD,  FACP, [facility], [facility], [facility]   [facility] [place]      To contact the attending provider between 7A-7P or the covering provider during after hours 7P-7A, please log into the web site [email or web address] and access using universal [place] password for that web site. If you do not have the password, please call the hospital operator.  11/01/2023, 11:15 AM

## 2023-11-01 NOTE — Progress Notes (Signed)
-----------------------------------------------------------  [facility] D(Data) A(Action) R(response)     Data: Noted PT recommendation for rolling walker and BSC 3 in1 at discharge.    Action: EPIC Secure Chat message to MD and Case Management    Response: MD acknowledged message and will place orders when able.     Antonis Lor, RN [employer]

## 2023-11-01 NOTE — Progress Notes (Signed)
 Initial Nutrition Assessment  DOCUMENTATION CODES:   Not applicable  INTERVENTION:  Regular diet  Encourage PO intake Vitamin D3 2000 units daily   NUTRITION DIAGNOSIS:   Increased nutrient needs related to post-op healing as evidenced by  (hip fracture).   GOAL:   Patient will meet greater than or equal to 90% of their needs   MONITOR:   PO intake  REASON FOR ASSESSMENT:   Consult Assessment of nutrition requirement/status (Hip/Femur fracture patient)  ASSESSMENT:   past medical history  of sick sinus syndrome status post pacemaker/paroxysmal A-fib on Eliquis , CAD, CKD stage IIIa, diabetes mellitus type 2, glaucoma sustained a fall towards the end of his 10K race, fell on his right side, hurt his right hip and right elbow with multiple bruises send lacerations.  Met with patient in room, daughters at bedside. Day 1 post op right total hip arthroplasty. Patient reports not feeling too hungry today after surgery but did eat breakfast and lunch, now having a coffee from [employer].  No problems with PO intake reported PTA, pt was training for 10K, fell at the finish line. RD assissted in ordering pt's dinner for tonight. Pt declines need for nutrition supplements at this time. No reported wieght changes at home, weight stable in chart for the past 3 months. Pain well controlled today, pt was able to work with PT/OT today.    Meds: D3 2000 units daily (home medication),  SSI 0-9 units q 4 hrs, protonix, miralax, senokot  Labs: Glu 124   NUTRITION - FOCUSED PHYSICAL EXAM:  Flowsheet Row Most Recent Value  Orbital Region No depletion  Upper Arm Region No depletion  Thoracic and Lumbar Region No depletion  Buccal Region No depletion  Temple Region No depletion  Clavicle Bone Region No depletion  Clavicle and Acromion Bone Region No depletion  Scapular Bone Region No depletion  Dorsal Hand No depletion  Patellar Region No depletion  Anterior Thigh Region No depletion   Posterior Calf Region No depletion  Hair Reviewed  Eyes Reviewed  Mouth Reviewed  Skin Reviewed  Nails Reviewed    Diet Order:   Diet Order             Diet regular Room service appropriate? Yes; Fluid consistency: Thin  Diet effective now                   EDUCATION NEEDS:   No education needs have been identified at this time  Skin:  Skin Assessment: Skin Integrity Issues: Skin Integrity Issues:: Incisions Incisions: right incision  Last BM:  10/19  Height:   Ht Readings from Last 1 Encounters:  10/30/23 5' 11 (1.803 m)    Weight:   Wt Readings from Last 1 Encounters:  10/30/23 68 kg    BMI:  Body mass index is 20.92 kg/m.  Estimated Nutritional Needs:   Kcal:  1900-2200 kcals  Protein:  68-82 grams  Fluid:  1.9-2.2L/d  Madalyn Potters, MS, RD, LDN Clinical Dietitian  Contact via secure chat. If unavailable, use group chat RD Inpatient.

## 2023-11-01 NOTE — Evaluation (Signed)
 Occupational Therapy Evaluation Patient Details Name: Travis [name] MRN: [id number] DOB: Nov 02, 1940 Today's Date: 11/01/2023   History of Present Illness   Pt is an 83 y/o male who presents 10/30/23 s/p mechanical fall. Pt was crossing the finish line of a 10k when he tripped and fell onto his R hip, R side of head, and put his R arm through plastic netting barricade. He sustained a R femoral neck fracture and is now s/p a R THA on 10/31/2023. PMH significant for heart murmur, HTN, pacemaker 2018, DM II, B knee arthroscopies, lap cholecystectomy.     Clinical Impressions PTA Pt was independent with ADLs/IADLs and functional mobility. Pt is very active and a runner. Pt is currently requiring Min A with RW for functional mobility and up to Mod A required for ADL engagement. Pt is primarily limited by decreased knowledge of DME/ AE, unsteadiness on feet, pain in RLE, and decreased activity tolerance and endurance. OT to continue to follow Pt acutely to facilitate progress towards goals. OT to recommend HHOT at discharge to maximize functional abilities and safe occupational engagement.       If plan is discharge home, recommend the following:   A little help with walking and/or transfers;A little help with bathing/dressing/bathroom;Assistance with cooking/housework;Assist for transportation;Help with stairs or ramp for entrance     Functional Status Assessment   Patient has had a recent decline in their functional status and demonstrates the ability to make significant improvements in function in a reasonable and predictable amount of time.     Equipment Recommendations   BSC/3in1;Other (comment) Adult nurse)     Recommendations for Other Services         Precautions/Restrictions   Precautions Precautions: Fall Recall of Precautions/Restrictions: Intact Restrictions Weight Bearing Restrictions Per Provider Order: Yes RLE Weight Bearing Per Provider Order: Weight  bearing as tolerated     Mobility Bed Mobility               General bed mobility comments: Pt greeted in recliner and returned to recliner at end of session.    Transfers Overall transfer level: Needs assistance Equipment used: Rolling walker (2 wheels) Transfers: Sit to/from Stand, Bed to chair/wheelchair/BSC Sit to Stand: Min assist, From elevated surface     Step pivot transfers: Min assist     General transfer comment: Pt with short, shuffling steps. Quckly fatigued with functional transfers.      Balance Overall balance assessment: Needs assistance Sitting-balance support: No upper extremity supported, Feet supported Sitting balance-Leahy Scale: Good Sitting balance - Comments: Maintained sitting balance on BSC and in recliner without posterior support.   Standing balance support: Bilateral upper extremity supported, During functional activity, Reliant on assistive device for balance Standing balance-Leahy Scale: Poor Standing balance comment: Dependent on RW                           ADL either performed or assessed with clinical judgement   ADL Overall ADL's : Needs assistance/impaired Eating/Feeding: Independent   Grooming: Contact guard assist;Wash/dry hands;Standing Grooming Details (indicate cue type and reason): CGA at sink for safety and maintain balance. Pt educated on use of DME at sink. Upper Body Bathing: Set up;Sitting   Lower Body Bathing: Moderate assistance;Sitting/lateral leans   Upper Body Dressing : Set up;Sitting   Lower Body Dressing: Moderate assistance;Sitting/lateral leans   Toilet Transfer: Minimal assistance;BSC/3in1;Rolling walker (2 wheels) Toilet Transfer Details (indicate cue type and reason): Pt required  Min A for stability and verbal cues for hand placement on RW and safe descent. Verbal cues needed to reinforce safe distance of RW from person as Pt frequently moved RW out of reach. Toileting- Clothing  Manipulation and Hygiene: Minimal assistance;Sit to/from stand Toileting - Clothing Manipulation Details (indicate cue type and reason): Min A to maintain balance and manage clothing.     Functional mobility during ADLs: Minimal assistance;Rolling walker (2 wheels)       Vision Patient Visual Report: No change from baseline Vision Assessment?: No apparent visual deficits     Perception         Praxis         Pertinent Vitals/Pain Pain Assessment Pain Assessment: 0-10 Pain Score: 5  Pain Location: RLE Pain Descriptors / Indicators: Guarding, Sore Pain Intervention(s): Limited activity within patient's tolerance, Monitored during session     Extremity/Trunk Assessment Upper Extremity Assessment Upper Extremity Assessment: Overall WFL for tasks assessed   Lower Extremity Assessment Lower Extremity Assessment: RLE deficits/detail RLE Deficits / Details: Acute pain and decreased strength/AROM consistent with above mentioned injury and subsequent surgery. RLE: Unable to fully assess due to pain   Cervical / Trunk Assessment Cervical / Trunk Assessment: Other exceptions Cervical / Trunk Exceptions: Forward head posture with rounded shoulders   Communication Communication Communication: Impaired Factors Affecting Communication: Hearing impaired   Cognition Arousal: Alert Behavior During Therapy: WFL for tasks assessed/performed Cognition: No apparent impairments                               Following commands: Intact       Cueing  General Comments   Cueing Techniques: Verbal cues;Visual cues  Pt daughter at bedside educated on role of OT in acute care and in the home. Educated daughter on safe transfer body mechanics and DME.   Exercises     Shoulder Instructions      Home Living Family/patient expects to be discharged to:: Private residence Living Arrangements: Alone Available Help at Discharge: Family;Available 24 hours/day Type of Home:  House (townhome) Home Access: Stairs to enter [employer] of Steps: 1 small step from the garage into the kitchen Entrance Stairs-Rails: None Home Layout: Two level;Able to live on main level with bedroom/bathroom     Bathroom Shower/Tub: Producer, television/film/video: Standard         Additional Comments: Above information is for daughter's home where he will be d/c initially.      Prior Functioning/Environment Prior Level of Function : Independent/Modified Independent             Mobility Comments: Active, fell running a10k ADLs Comments: Independent    OT Problem List: Decreased strength;Decreased activity tolerance;Impaired balance (sitting and/or standing);Decreased safety awareness;Decreased knowledge of use of DME or AE;Decreased knowledge of precautions;Pain   OT Treatment/Interventions: Self-care/ADL training;Therapeutic exercise;Energy conservation;DME and/or AE instruction;Therapeutic activities;Patient/family education;Balance training      OT Goals(Current goals can be found in the care plan section)   Acute Rehab OT Goals Patient Stated Goal: To use the bathroom OT Goal Formulation: With patient Time For Goal Achievement: 11/15/23 Potential to Achieve Goals: Good ADL Goals Pt Will Perform Grooming: with supervision;standing Pt Will Perform Lower Body Bathing: with supervision;with adaptive equipment;sitting/lateral leans Pt Will Perform Lower Body Dressing: with supervision;with adaptive equipment;sitting/lateral leans Pt Will Transfer to Toilet: with supervision;ambulating;regular height toilet Pt Will Perform Toileting - Clothing Manipulation and hygiene: with supervision  OT Frequency:  Min 2X/week    Co-evaluation              AM-PAC OT 6 Clicks Daily Activity     Outcome Measure Help from another person eating meals?: None Help from another person taking care of personal grooming?: A Little Help from another person  toileting, which includes using toliet, bedpan, or urinal?: A Little Help from another person bathing (including washing, rinsing, drying)?: A Lot Help from another person to put on and taking off regular upper body clothing?: A Little Help from another person to put on and taking off regular lower body clothing?: A Lot 6 Click Score: 17   End of Session Equipment Utilized During Treatment: Gait belt;Rolling walker (2 wheels) Nurse Communication: Mobility status  Activity Tolerance: Patient tolerated treatment well Patient left: in chair;with call bell/phone within reach;with chair alarm set;with family/visitor present  OT Visit Diagnosis: Unsteadiness on feet (R26.81);Muscle weakness (generalized) (M62.81);History of falling (Z91.81);Pain Pain - Right/Left: Right Pain - part of body: Leg                Time: 1111-1141 OT Time Calculation (min): 30 min Charges:  OT General Charges $OT Visit: 1 Visit OT Evaluation $OT Eval Low Complexity: 1 Low OT Treatments $Self Care/Home Management : 8-22 mins  Maurilio CROME, OTR/L.  [facility] Acute Rehabilitation  Office: [phone]   Maurilio PARAS Matheson Vandehei 11/01/2023, 12:46 PM

## 2023-11-01 NOTE — TOC Initial Note (Signed)
 Transition of Care [facility]) - Initial/Assessment Note    Patient Details  Name: Travis [name] MRN: [id number] Date of Birth: 11-12-1940  Transition of Care [facility]) CM/SW Contact:    Rosalva Jon Bloch, RN Phone Number: 11/01/2023, 4:16 PM  Clinical Narrative:                     - s/p R total hip replacement  NCM spoke with pt in regard to d/c planning with daughters @ bedside.Pt states will transition to daughter's home ( [address], [place] , [place] [address])  with home health services.  Choice offered and [facility] selected. [facility] accepted pt for home health services. Referral made with [employer] for DME: RW and BSC. Equipment will be delivered to bedside prior to d/c. Pt without RX med concerns or transportation issues. IP CM following and will continue assisting with needs....  Expected Discharge Plan: Home w Home Health Services Barriers to Discharge: Continued Medical Work up   Patient Goals and CMS Choice     Choice offered to / list presented to : Patient      Expected Discharge Plan and Services   Discharge Planning Services: CM Consult   Living arrangements for the past 2 months: Single Family Home                 DME Arranged: Walker rolling, Bedside commode DME Agency: [employer] Date DME Agency Contacted: 11/01/23 Time DME Agency Contacted: 1615   HH Arranged: PT, OT HH Agency: [facility] Date HH Agency Contacted: 11/01/23 Time HH Agency Contacted: 1615 Representative spoke with at [facility] Agency: [facility]  Prior Living Arrangements/Services Living arrangements for the past 2 months: Single Family Home Lives with:: Self Patient language and need for interpreter reviewed:: Yes Do you feel safe going back to the place where you live?: Yes      Need for Family Participation in Patient Care: Yes (Comment) Care giver support system in place?: Yes (comment)   Criminal Activity/Legal Involvement  Pertinent to Current Situation/Hospitalization: No - Comment as needed  Activities of Daily Living   ADL Screening (condition at time of admission) Independently performs ADLs?: Yes (appropriate for developmental age) Is the patient deaf or have difficulty hearing?: Yes Does the patient have difficulty seeing, even when wearing glasses/contacts?: No Does the patient have difficulty concentrating, remembering, or making decisions?: No  Permission Sought/Granted                  Emotional Assessment       Orientation: : Oriented to Self, Oriented to Place, Oriented to  Time, Oriented to Situation Alcohol / Substance Use: Not Applicable Psych Involvement: No (comment)  Admission diagnosis:  Contusion of scalp, initial encounter [S00.03XA] Other fracture of right femur, initial encounter for closed fracture (HCC) [S72.8X1A] Fall, initial encounter [W19.XXXA] Closed displaced fracture of right femoral neck (HCC) [S72.001A] Patient Active Problem List   Diagnosis Date Noted   S/P total right hip arthroplasty 10/31/2023   Other fracture of right femur, initial encounter for closed fracture (HCC) 10/30/2023   Persistent atrial fibrillation (HCC) 10/01/2023   Chronic kidney disease, stage 3a (HCC) 07/06/2023   Elevated PSA 07/06/2023   Swelling of right hand 07/06/2023   Urticaria 07/06/2023   Closed fracture of nasal bones 12/27/2020   Impacted cerumen of left ear 12/27/2020   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   Proteinuria 10/13/2018   Encounter for general adult  medical examination without abnormal findings 10/06/2018   Glaucoma 04/12/2018   Hearing loss 04/12/2018   History of urinary stone 04/12/2018   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 [place] [address] Phone: [phone] Fax: [phone]     Social Drivers of Health (SDOH) Social History: SDOH Screenings   Food Insecurity: No Food Insecurity (10/30/2023)  Housing: Low Risk  (10/30/2023)  Transportation Needs: No Transportation Needs (10/30/2023)  Utilities: Not At Risk (10/30/2023)  Social Connections: Moderately Isolated (10/30/2023)  Tobacco Use: Medium Risk (10/31/2023)   SDOH Interventions:     Readmission Risk Interventions     No data to display

## 2023-11-01 NOTE — Evaluation (Signed)
 Physical Therapy Evaluation  Patient Details Name: Travis [name] MRN: [id number] DOB: August 31, 1940 Today's Date: 11/01/2023  History of Present Illness  Pt is an 83 y/o male who presents 10/30/23 s/p mechanical fall. Pt was crossing the finish line of a 10k when he tripped and fell onto his R hip, R side of head, and put his R arm through plastic netting barricade. He sustained a R femoral neck fracture and is now s/p a R THA on 10/31/2023. PMH significant for heart murmur, HTN, pacemaker 2018, DM II, B knee arthroscopies, lap cholecystectomy.   Clinical Impression  Pt admitted with above diagnosis. Pt currently with functional limitations due to the deficits listed below (see PT Problem List). At the time of PT eval pt was able to perform transfers and ambulation with gross min assist and RW for support. Pt with good family support available and plans to d/c to daughter's house where he will have 24/7 support. PTA pt very independent and active, and anticipate pt will progress well. Pt will benefit from acute skilled PT to increase their independence and safety with mobility to allow discharge.          If plan is discharge home, recommend the following: A little help with walking and/or transfers;A little help with bathing/dressing/bathroom;Assistance with cooking/housework;Assist for transportation;Help with stairs or ramp for entrance   Can travel by private vehicle        Equipment Recommendations Rolling walker (2 wheels);BSC/3in1  Recommendations for Other Services       Functional Status Assessment Patient has had a recent decline in their functional status and demonstrates the ability to make significant improvements in function in a reasonable and predictable amount of time.     Precautions / Restrictions Precautions Precautions: Fall Recall of Precautions/Restrictions: Intact Restrictions Weight Bearing Restrictions Per Provider Order: Yes RLE Weight Bearing Per Provider  Order: Weight bearing as tolerated      Mobility  Bed Mobility Overal bed mobility: Needs Assistance Bed Mobility: Supine to Sit     Supine to sit: Min assist, Mod assist     General bed mobility comments: Min assist for RLE movement. Increased to mod assist for RLE management and trunk management to get fully to EOB.    Transfers Overall transfer level: Needs assistance Equipment used: Rolling walker (2 wheels) Transfers: Sit to/from Stand, Bed to chair/wheelchair/BSC Sit to Stand: Contact guard assist   Step pivot transfers: Min assist       General transfer comment: With increased time pt able to power up from low chair height with CGA. Light steadying assist for transition bed>chair.    Ambulation/Gait Ambulation/Gait assistance: Min assist Gait Distance (Feet): 100 Feet Assistive device: Rolling walker (2 wheels) Gait Pattern/deviations: Step-through pattern, Step-to pattern, Decreased stride length, Trunk flexed Gait velocity: Decreased Gait velocity interpretation: <1.31 ft/sec, indicative of household ambulator   General Gait Details: VC's for improved posture, closer walker proximity and forward gaze. Hands on guarding with intermittent assist for balance support, safety, and walker management.  Stairs            Wheelchair Mobility     Tilt Bed    Modified Rankin (Stroke Patients Only)       Balance Overall balance assessment: Needs assistance Sitting-balance support: No upper extremity supported, Feet supported Sitting balance-Leahy Scale: Good Sitting balance - Comments: Maintained sitting balance on BSC and in recliner without posterior support.   Standing balance support: Bilateral upper extremity supported, During functional activity, Reliant on  assistive device for balance Standing balance-Leahy Scale: Poor Standing balance comment: Dependent on RW                             Pertinent Vitals/Pain Pain Assessment Pain  Assessment: 0-10 Pain Score: 3  Pain Location: RLE Pain Descriptors / Indicators: Operative site guarding, Sore Pain Intervention(s): Limited activity within patient's tolerance, Monitored during session, Repositioned    Home Living Family/patient expects to be discharged to:: Private residence Living Arrangements: Alone Available Help at Discharge: Family;Available 24 hours/day Type of Home: House (townhome) Home Access: Stairs to enter Entrance Stairs-Rails: None Entrance Stairs-Number of Steps: 1 small step from the garage into the kitchen   Home Layout: Two level;Able to live on main level with bedroom/bathroom   Additional Comments: Above information is for daughter's home where he will be d/c initially.    Prior Function Prior Level of Function : Independent/Modified Independent             Mobility Comments: Active, fell running a10k ADLs Comments: Independent     Extremity/Trunk Assessment   Upper Extremity Assessment Upper Extremity Assessment: Overall WFL for tasks assessed    Lower Extremity Assessment Lower Extremity Assessment: RLE deficits/detail RLE Deficits / Details: Acute pain and decreased strength/AROM consistent with above mentioned injury and subsequent surgery. RLE: Unable to fully assess due to pain    Cervical / Trunk Assessment Cervical / Trunk Assessment: Other exceptions Cervical / Trunk Exceptions: Forward head posture with rounded shoulders  Communication   Communication Communication: Impaired Factors Affecting Communication: Hearing impaired    Cognition Arousal: Alert Behavior During Therapy: WFL for tasks assessed/performed   PT - Cognitive impairments: No apparent impairments                         Following commands: Intact       Cueing Cueing Techniques: Verbal cues, Gestural cues     General Comments General comments (skin integrity, edema, etc.): Pt daughter at bedside educated on role of OT in acute  care and in the home. Educated daughter on safe transfer body mechanics and DME.    Exercises     Assessment/Plan    PT Assessment Patient needs continued PT services  PT Problem List Decreased strength;Decreased range of motion;Decreased activity tolerance;Decreased balance;Decreased mobility;Decreased knowledge of use of DME;Decreased safety awareness;Decreased knowledge of precautions;Pain       PT Treatment Interventions DME instruction;Gait training;Stair training;Functional mobility training;Therapeutic activities;Therapeutic exercise;Balance training;Patient/family education    PT Goals (Current goals can be found in the Care Plan section)  Acute Rehab PT Goals Patient Stated Goal: Return home with daughter at d/c PT Goal Formulation: With patient Time For Goal Achievement: 11/15/23 Potential to Achieve Goals: Good    Frequency Min 2X/week     Co-evaluation               AM-PAC PT 6 Clicks Mobility  Outcome Measure Help needed turning from your back to your side while in a flat bed without using bedrails?: A Little Help needed moving from lying on your back to sitting on the side of a flat bed without using bedrails?: A Little Help needed moving to and from a bed to a chair (including a wheelchair)?: A Little Help needed standing up from a chair using your arms (e.g., wheelchair or bedside chair)?: A Little Help needed to walk in hospital room?: A Little Help needed  climbing 3-5 steps with a railing? : A Lot 6 Click Score: 17    End of Session Equipment Utilized During Treatment: Gait belt Activity Tolerance: Patient tolerated treatment well Patient left: in chair;with call bell/phone within reach;with family/visitor present;Other (comment) (OT present) Nurse Communication: Mobility status PT Visit Diagnosis: Unsteadiness on feet (R26.81);Pain Pain - Right/Left: Right Pain - part of body: Hip;Knee;Leg    Time: 8961-8869 PT Time Calculation (min) (ACUTE  ONLY): 52 min   Charges:   PT Evaluation $PT Eval Moderate Complexity: 1 Mod PT Treatments $Gait Training: 38-52 mins PT General Charges $$ ACUTE PT VISIT: 1 Visit         Leita Sable, PT, DPT Acute Rehabilitation Services Secure Chat Preferred Office: [phone]   Leita JONETTA Sable 11/01/2023, 1:09 PM

## 2023-11-01 NOTE — Progress Notes (Signed)
   Subjective: 1 Day Post-Op Procedure(s) (LRB): RIGHT TOTAL HIP ARTHROPLASTY,  ANTERIOR APPROACH (Right) Patient reports pain as mild.   Patient seen in rounds for Dr. Ernie. Patient is resting in bed on exam this morning. No acute events overnight. He reports his pain was immediately better when he woke up. He has not noticed any new pain elsewhere. He lives alone, but tells me he has two daughters that will be helping him post-op and he intends to discharge home.  We will start therapy today.   Objective: Vital signs in last 24 hours: Temp:  [96.3 F (35.7 C)-98.4 F (36.9 C)] 98 F (36.7 C) (10/20 0426) Pulse Rate:  [57-68] 66 (10/20 0426) Resp:  [13-18] 16 (10/20 0426) BP: (100-132)/(51-75) 128/64 (10/20 0426) SpO2:  [95 %-100 %] 99 % (10/20 0426)  Intake/Output from previous day:  Intake/Output Summary (Last 24 hours) at 11/01/2023 0658 Last data filed at 11/01/2023 0630 Gross per 24 hour  Intake 550 ml  Output 1670 ml  Net -1120 ml     Intake/Output this shift: Total I/O In: -  Out: 1000 [Urine:1000]  Labs: Recent Labs    10/30/23 1131 10/31/23 0730  HGB 11.9* 12.3*   Recent Labs    10/30/23 1131 10/31/23 0730  WBC 10.9* 11.1*  RBC 3.61* 3.71*  HCT 34.5* 35.8*  PLT 244 210   Recent Labs    10/30/23 1131 10/31/23 0730  NA 141 136  K 5.2* 4.2  CL 106 104  CO2 17* 20*  BUN 26* 22  CREATININE 1.50* 1.22  GLUCOSE 110* 140*  CALCIUM  10.0 9.3   No results for input(s): LABPT, INR in the last 72 hours.  Exam: General - Patient is Alert and Oriented Extremity - Neurologically intact Sensation intact distally Intact pulses distally Dorsiflexion/Plantar flexion intact Dressing - dressing C/D/I Motor Function - intact, moving foot and toes well on exam.   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   Weight as of this encounter: 68 kg. Advance diet Up with therapy  DVT Prophylaxis - Eliquis  Weight bearing as tolerated.  Lab had just left the room when I entered Awaiting CBC today  Up with PT today   Hopeful d/c home tomorrow Do not recommend SNF placement for him Typically no HHPT/OPPT after hip replacement, but will see how he does today He is very active and has good family support  Rosina Calin, PA-C Orthopedic Surgery [phone] 11/01/2023, 6:58 AM

## 2023-11-01 NOTE — Plan of Care (Signed)

## 2023-11-02 ENCOUNTER — Other Ambulatory Visit (HOSPITAL_COMMUNITY): Payer: Self-pay

## 2023-11-02 ENCOUNTER — Telehealth: Payer: Self-pay | Admitting: Cardiovascular Disease

## 2023-11-02 DIAGNOSIS — S728X1A Other fracture of right femur, initial encounter for closed fracture: Secondary | ICD-10-CM | POA: Diagnosis not present

## 2023-11-02 LAB — MAGNESIUM: Magnesium: 1.6 mg/dL — ABNORMAL LOW (ref 1.7–2.4)

## 2023-11-02 LAB — COMPREHENSIVE METABOLIC PANEL WITH GFR
ALT: 9 U/L (ref 0–44)
AST: 29 U/L (ref 15–41)
Albumin: 3.1 g/dL — ABNORMAL LOW (ref 3.5–5.0)
Alkaline Phosphatase: 42 U/L (ref 38–126)
Anion gap: 7 (ref 5–15)
BUN: 35 mg/dL — ABNORMAL HIGH (ref 8–23)
CO2: 23 mmol/L (ref 22–32)
Calcium: 9.4 mg/dL (ref 8.9–10.3)
Chloride: 104 mmol/L (ref 98–111)
Creatinine, Ser: 1.57 mg/dL — ABNORMAL HIGH (ref 0.61–1.24)
GFR, Estimated: 43 mL/min — ABNORMAL LOW (ref >60)
Glucose, Bld: 124 mg/dL — ABNORMAL HIGH (ref 70–99)
Potassium: 4.7 mmol/L (ref 3.5–5.1)
Sodium: 134 mmol/L — ABNORMAL LOW (ref 135–145)
Total Bilirubin: 1.2 mg/dL (ref 0.0–1.2)
Total Protein: 5.8 g/dL — ABNORMAL LOW (ref 6.5–8.1)

## 2023-11-02 LAB — CBC
HCT: 27.5 % — ABNORMAL LOW (ref 39.0–52.0)
Hemoglobin: 9.8 g/dL — ABNORMAL LOW (ref 13.0–17.0)
MCH: 33.3 pg (ref 26.0–34.0)
MCHC: 35.6 g/dL (ref 30.0–36.0)
MCV: 93.5 fL (ref 80.0–100.0)
Platelets: 207 K/uL (ref 150–400)
RBC: 2.94 MIL/uL — ABNORMAL LOW (ref 4.22–5.81)
RDW: 12.2 % (ref 11.5–15.5)
WBC: 14.4 K/uL — ABNORMAL HIGH (ref 4.0–10.5)
nRBC: 0 % (ref 0.0–0.2)

## 2023-11-02 LAB — CK: Total CK: 363 U/L (ref 49–397)

## 2023-11-02 LAB — GLUCOSE, CAPILLARY
Glucose-Capillary: 111 mg/dL — ABNORMAL HIGH (ref 70–99)
Glucose-Capillary: 223 mg/dL — ABNORMAL HIGH (ref 70–99)

## 2023-11-02 MED ORDER — TRAMADOL HCL 50 MG PO TABS
50.0000 mg | ORAL_TABLET | Freq: Three times a day (TID) | ORAL | 0 refills | Status: DC | PRN
  Filled 2023-11-02: qty 10, 3d supply, fill #0

## 2023-11-02 MED ORDER — POLYETHYLENE GLYCOL 3350 17 GM/SCOOP PO POWD
17.0000 g | Freq: Every day | ORAL | 0 refills | Status: DC
  Filled 2023-11-02: qty 238, 14d supply, fill #0

## 2023-11-02 MED ORDER — SENNA 8.6 MG PO TABS
1.0000 | ORAL_TABLET | Freq: Every evening | ORAL | 0 refills | Status: DC | PRN
  Filled 2023-11-02: qty 30, 30d supply, fill #0

## 2023-11-02 MED ORDER — ACETAMINOPHEN 500 MG PO TABS: 500.0000 mg | ORAL_TABLET | Freq: Three times a day (TID) | ORAL | Status: DC | PRN

## 2023-11-02 MED ORDER — MAGNESIUM SULFATE 4 GM/100ML IV SOLN
4.0000 g | Freq: Once | INTRAVENOUS | Status: AC
  Administered 2023-11-02: 4 g via INTRAVENOUS
  Filled 2023-11-02: qty 100

## 2023-11-02 NOTE — Progress Notes (Signed)
 Physical Therapy Treatment  Patient Details Name: Travis [name] MRN: [id number] DOB: 28-Feb-1940 Today's Date: 11/02/2023   History of Present Illness Pt is an 83 y/o male who presents 10/30/23 s/p mechanical fall. Pt was crossing the finish line of a 10k when he tripped and fell onto his R hip, R side of head, and put his R arm through plastic netting barricade. He sustained a R femoral neck fracture and is now s/p a R THA on 10/31/2023. PMH significant for heart murmur, HTN, pacemaker 2018, DM II, B knee arthroscopies, lap cholecystectomy.    PT Comments  Pt progressing towards physical therapy goals. Was able to perform transfers and ambulation with gross CGA and RW for support. Pt motivated for distance, and was able to perform HEP well. Handout provided. Will continue to follow and progress as able per POC.     If plan is discharge home, recommend the following: A little help with walking and/or transfers;A little help with bathing/dressing/bathroom;Assistance with cooking/housework;Assist for transportation;Help with stairs or ramp for entrance   Can travel by private vehicle        Equipment Recommendations  Rolling walker (2 wheels);BSC/3in1    Recommendations for Other Services       Precautions / Restrictions Precautions Precautions: Fall Recall of Precautions/Restrictions: Intact Restrictions Weight Bearing Restrictions Per Provider Order: Yes RLE Weight Bearing Per Provider Order: Weight bearing as tolerated     Mobility  Bed Mobility               General bed mobility comments: Pt was received sitting up in recliner.    Transfers Overall transfer level: Needs assistance Equipment used: Rolling walker (2 wheels) Transfers: Sit to/from Stand, Bed to chair/wheelchair/BSC Sit to Stand: Contact guard assist           General transfer comment: VC's for hand placement on seated surface for safety. Increased time and effort but able to complete without  assist. Hands on guarding for safety.    Ambulation/Gait Ambulation/Gait assistance: Contact guard assist Gait Distance (Feet): 150 Feet Assistive device: Rolling walker (2 wheels) Gait Pattern/deviations: Step-through pattern, Step-to pattern, Decreased stride length, Trunk flexed Gait velocity: Decreased Gait velocity interpretation: 1.31 - 2.62 ft/sec, indicative of limited community ambulator   General Gait Details: VC's for improved posture, closer walker proximity and forward gaze. Pt with good self-monitoring for endurance.   Stairs             Wheelchair Mobility     Tilt Bed    Modified Rankin (Stroke Patients Only)       Balance Overall balance assessment: Needs assistance Sitting-balance support: No upper extremity supported, Feet supported Sitting balance-Leahy Scale: Good Sitting balance - Comments: Maintained sitting balance on BSC and in recliner without posterior support.   Standing balance support: Bilateral upper extremity supported, During functional activity, Reliant on assistive device for balance Standing balance-Leahy Scale: Poor Standing balance comment: Dependent on RW                            Communication Communication Communication: Impaired Factors Affecting Communication: Hearing impaired  Cognition Arousal: Alert Behavior During Therapy: WFL for tasks assessed/performed   PT - Cognitive impairments: No apparent impairments                         Following commands: Intact      Cueing Cueing Techniques: Verbal cues, Gestural cues  Exercises Total Joint Exercises Ankle Circles/Pumps: 10 reps Quad Sets: 10 reps Short Arc Quad: 10 reps Heel Slides: 10 reps Long Arc Quad: 10 reps    General Comments        Pertinent Vitals/Pain Pain Assessment Pain Location: RLE Pain Descriptors / Indicators: Operative site guarding, Sore    Home Living                          Prior Function             PT Goals (current goals can now be found in the care plan section) Acute Rehab PT Goals Patient Stated Goal: Return home with daughter at d/c PT Goal Formulation: With patient Time For Goal Achievement: 11/15/23 Potential to Achieve Goals: Good Progress towards PT goals: Progressing toward goals    Frequency    Min 2X/week      PT Plan      Co-evaluation              AM-PAC PT 6 Clicks Mobility   Outcome Measure  Help needed turning from your back to your side while in a flat bed without using bedrails?: A Little Help needed moving from lying on your back to sitting on the side of a flat bed without using bedrails?: A Little Help needed moving to and from a bed to a chair (including a wheelchair)?: A Little Help needed standing up from a chair using your arms (e.g., wheelchair or bedside chair)?: A Little Help needed to walk in hospital room?: A Little Help needed climbing 3-5 steps with a railing? : A Little 6 Click Score: 18    End of Session Equipment Utilized During Treatment: Gait belt Activity Tolerance: Patient tolerated treatment well Patient left: in chair;with call bell/phone within reach;with chair alarm set Nurse Communication: Mobility status PT Visit Diagnosis: Unsteadiness on feet (R26.81);Pain Pain - Right/Left: Right Pain - part of body: Hip;Knee;Leg     Time: 1230-1305 PT Time Calculation (min) (ACUTE ONLY): 35 min  Charges:    $Gait Training: 8-22 mins $Therapeutic Exercise: 8-22 mins PT General Charges $$ ACUTE PT VISIT: 1 Visit                     Travis [name], PT, DPT Acute Rehabilitation Services Secure Chat Preferred Office: [phone]    Travis [name] 11/02/2023, 2:13 PM

## 2023-11-02 NOTE — Progress Notes (Signed)
 Mobility Specialist Progress Note:    11/02/23 1208  Mobility  Activity Ambulated with assistance  Level of Assistance Minimal assist, patient does 75% or more  Assistive Device Front wheel walker  Distance Ambulated (ft) 100 ft  RLE Weight Bearing Per Provider Order WBAT  Activity Response Tolerated well  Mobility Referral Yes  Mobility visit 1 Mobility  Mobility Specialist Start Time (ACUTE ONLY) 1142  Mobility Specialist Stop Time (ACUTE ONLY) 1158  Mobility Specialist Time Calculation (min) (ACUTE ONLY) 16 min   Pt received in chair agreeable to mobility. No c/o throughout. Pt required MinA for STS and contact guard during ambulation. Returned to room w/o fault. Left in chair w/ call bell and personal belongings in reach. All needs met. Chair alarm on.  Thersia Minder Mobility Specialist  Please contact vis Secure Chat or  Rehab Office [phone]

## 2023-11-02 NOTE — Care Management Important Message (Signed)
 Important Message  Patient Details  Name: Travis [name] MRN: [id number] Date of Birth: July 24, 1940   Important Message Given:  Yes - Medicare IM     Jon Cruel 11/02/2023, 4:24 PM

## 2023-11-02 NOTE — Telephone Encounter (Signed)
 Patient says someone called him about 30 minutes ago but no one left a message or told him what it was regarding.

## 2023-11-02 NOTE — Telephone Encounter (Signed)
 Pt advised of upcoming appointment, I do not see other documentation that pt was called.

## 2023-11-02 NOTE — Plan of Care (Signed)
  Problem: Education: Goal: Knowledge of General Education information will improve Description: Including pain rating scale, medication(s)/side effects and non-pharmacologic comfort measures Outcome: Progressing   Problem: Activity: Goal: Risk for activity intolerance will decrease Outcome: Progressing   Problem: Elimination: Goal: Will not experience complications related to bowel motility Outcome: Progressing Goal: Will not experience complications related to urinary retention Outcome: Progressing   Problem: Pain Managment: Goal: General experience of comfort will improve and/or be controlled Outcome: Progressing

## 2023-11-02 NOTE — Progress Notes (Signed)
 Pacemaker interrogation performed after the patient had an episode of wide complex tachycardia today. The device check confirms a brief episode of nonsustained ventricular tachycardia this morning, but none was seen at the time of his injury. Normal pacemaker function. Current rhythm is A paced, V sensed (normal). Pacemaker interrogation has previously shown similar brief episodes of NSVT, once every 1-2 months on the average.

## 2023-11-02 NOTE — Discharge Summary (Signed)
 Physician Discharge Summary  Travis [name] FMW:[id number] DOB: 02/19/1940  PCP: Tisovec, Richard W, MD  Admitted from: Home Discharged to: Home  Admit date: 10/30/2023 Discharge date: 11/02/2023  Recommendations for Outpatient Follow-up:    Contact information for follow-up providers     Ernie Cough, MD. Schedule an appointment as soon as possible for a visit in 2 week(s).   Specialty: Orthopedic Surgery Contact information: [address] [place] [address] [place] [address] [phone]         Tisovec, Charlie ORN, MD. Schedule an appointment as soon as possible for a visit in 2 week(s).   Specialty: Internal Medicine Contact information: [address] [place] [address] [phone]         [facility] Follow up.   Why: home health services will provided by [facility] information: [address] [name], [place] [address] Phone: [phone]             Contact information for after-discharge care     Home Medical Care     [facility]) .   Service: Home Health Services Contact information: [address] [place] [address] [place]  [address] [phone]                    Patient has a follow-up with Mr. Scot Ford, Cardiology APP on 10/29 @ 8:50 am with repeat labs (CBC & BMP).  Home Health: Home Health Orders (From admission, onward)     Start     Ordered   11/02/23 1451  Home Health  At discharge       Question Answer Comment  To provide the following care/treatments PT   To provide the following care/treatments OT      11/02/23 1455             Equipment/Devices:     Durable Medical Equipment  (From admission, onward)           Start     Ordered   11/01/23 1610  For home use only DME Walker rolling  Once       Question Answer Comment  Walker: With 5 Inch Wheels   Patient needs a walker to treat with the following  condition Gait instability      11/01/23 1610   11/01/23 1610  For home use only DME Bedside commode  Once       Question:  Patient needs a bedside commode to treat with the following condition  Answer:  Gait instability   11/01/23 1610             Discharge Condition: Improved and stable.   Code Status: Full Code Diet recommendation:  Discharge Diet Orders (From admission, onward)     Start     Ordered   11/02/23 0000  Diet - low sodium heart healthy        11/02/23 1455   11/02/23 0000  Diet Carb Modified        11/02/23 1455             Discharge Diagnoses:  Principal Problem:   Other fracture of right femur, initial encounter for closed fracture [facility]) Active Problems:   S/P total right hip arthroplasty   Brief Hospital Course:  83 y.o. male with past medical history  of sick sinus syndrome status post pacemaker/paroxysmal A-fib on Eliquis , CAD, CKD stage IIIa, diabetes mellitus type 2, glaucoma sustained a fall  towards the end of his 10K race, fell on his right side, hurt his right hip and right elbow with multiple bruises send lacerations.  Admitted for right hip femoral neck fracture.  Orthopedics was consulted and s/p right total hip arthroplasty 10/19.     Assessment & Plan:    Right hip femoral neck fracture S/p mechanical fall Orthopedics consulted.  S/p right total hip arthroplasty by Dr. Ernie 10/19.  550 mL EBL at surgery Postop care per orthopedics. Right humerus x-ray, CT head and C-spine without acute findings. PTA, patient lived alone and was quite independent, ran a 10K marathon annually for the last 40 years.  Therapies follow-up appreciated.  Ambulated 150 feet with PT.  As per the recommendations, ordered home health PT, OT and DME's.  At discharge patient will stay with his daughter for recuperation and she will be able to provide 24/7 supervision and assistance as discussed with her yesterday and today. Discussed with Dr. Ernie, has cleared  patient for discharge home with outpatient follow-up with him.   Acute posthemorrhagic anemia Hemoglobin dropped from 12.3 preop to 9.7 postop.  EBL 550 mL. Hemoglobin stable, 9.8 on day of discharge. Leukocytosis likely stress response.  No concern for infection clinically.   PAF NSVT Atrial paced rhythm on telemetry. Does not appear to be on rate control medications PTA. Continue PTA apixaban  5 mg twice daily. On day of discharge 10/21, at 11:04 AM, patient had a 5-6 beat NSVT followed by 23 beat NSVT.  Asymptomatic.  Confirmed with patient that he did not pass out leading to fall on day of admission. Discussed with Dr. Francyne, patient's primary cardiologist, who reviewed and confirmed NSVT on telemetry.  He had pacemaker interrogated and there were no other events other than today.  Specifically no such events on day of injury.  He recommends no specific intervention and has arranged close outpatient follow-up. Replacing low magnesium.   CAD Continue Zetia , statins and Eliquis .  No anginal symptoms.   Stage IIIa CKD Mild hyperkalemia Creatinine has actually improved from 1.5 on admission to 1.22-1.21 Hyperkalemia resolved. On day of discharge, creatinine again went up from 1.2-1.57.  CK normal.  Baseline creatinine is not known.  Last known creatinine prior to this year was 1.01 in 2019.  This year creatinine has fluctuated previously in the 1.3-1.5.  Current creatinine may be at his baseline. Due to abundance of caution, will hold HCTZ and ACEI until outpatient follow-up with labs with cardiology. Patient and family were encouraged for him to have ad lib. fluid intake.   Type II DM, controlled A1c 6.2 Continue prior home dose of metformin .   Hypertension Controlled.  Continue home dose of amlodipine .  Hold HCTZ and lisinopril  until outpatient follow-up with labs.  Hypomagnesemia Replace and follow   Glaucoma Continue Combigan    Body mass index is 20.92 kg/m.       Consultants:   Orthopedics   Procedures:   As above   Discharge Instructions  Discharge Instructions     Call MD for:  difficulty breathing, headache or visual disturbances   Complete by: As directed    Call MD for:  extreme fatigue   Complete by: As directed    Call MD for:  persistant dizziness or light-headedness   Complete by: As directed    Call MD for:  persistant nausea and vomiting   Complete by: As directed    Call MD for:  redness, tenderness, or signs of infection (pain, swelling, redness, odor  or green/yellow discharge around incision site)   Complete by: As directed    Call MD for:  severe uncontrolled pain   Complete by: As directed    Call MD for:  temperature >100.4   Complete by: As directed    Diet - low sodium heart healthy   Complete by: As directed    Diet Carb Modified   Complete by: As directed    Increase activity slowly   Complete by: As directed    No wound care   Complete by: As directed         Medication List     PAUSE taking these medications    hydrochlorothiazide  12.5 MG capsule Wait to take this until your doctor or other care provider tells you to start again. Commonly known as: MICROZIDE  Take 1 capsule (12.5 mg total) by mouth daily.   lisinopril  20 MG tablet Wait to take this until your doctor or other care provider tells you to start again. Commonly known as: ZESTRIL  Take 1 tablet (20 mg total) by mouth daily.       TAKE these medications    acetaminophen  500 MG tablet Commonly known as: TYLENOL  Take 1-2 tablets (500-1,000 mg total) by mouth every 8 (eight) hours as needed for mild pain (pain score 1-3).   amLODipine  5 MG tablet Commonly known as: NORVASC  Take 1 tablet (5 mg total) by mouth daily.   atorvastatin  40 MG tablet Commonly known as: LIPITOR Take 1 tablet (40 mg total) by mouth daily.   brimonidine -timolol  0.2-0.5 % ophthalmic solution Commonly known as: COMBIGAN  Place 1 drop into both eyes 2 (two)  times daily.   Eliquis  5 MG Tabs tablet Generic drug: apixaban  Take 1 tablet (5 mg total) by mouth 2 (two) times daily.   ezetimibe  10 MG tablet Commonly known as: ZETIA  Take 1 tablet (10 mg total) by mouth daily.   metFORMIN  500 MG tablet Commonly known as: GLUCOPHAGE  Take 1 tablet (500 mg total) by mouth 2 (two) times daily.   OneTouch Verio Flex System w/Device Kit Use to test blood sugars 2-3x a week   OneTouch Verio test strip Generic drug: glucose blood Use to test blood sugars 2-3x a week E11.9   polyethylene glycol powder 17 GM/SCOOP powder Commonly known as: GLYCOLAX/MIRALAX Take 1 capful (17 g) with water by mouth daily. Dissolve 1 capful (17g) in 4-8 ounces of liquid and take by mouth daily.   senna 8.6 MG Tabs tablet Commonly known as: SENOKOT Take 1 tablet (8.6 mg total) by mouth at bedtime as needed for mild constipation or moderate constipation.   traMADol 50 MG tablet Commonly known as: ULTRAM Take 1 tablet (50 mg total) by mouth every 8 (eight) hours as needed for moderate pain (pain score 4-6) or severe pain (pain score 7-10).   Vitamin D3 50 MCG (2000 UT) capsule Take 2,000 Units by mouth daily.       No Known Allergies    Procedures/Studies: DG Pelvis Portable Result Date: 10/31/2023 CLINICAL DATA:  Status post hip arthroplasty. EXAM: PORTABLE PELVIS 1-2 VIEWS COMPARISON:  Preoperative imaging. FINDINGS: Right hip arthroplasty in expected alignment. No periprosthetic lucency or fracture. Recent postsurgical change includes air and edema in the soft tissues. IMPRESSION: Right hip arthroplasty without immediate postoperative complication. Electronically Signed   By: Andrea Gasman M.D.   On: 10/31/2023 12:45   DG HIP UNILAT WITH PELVIS 1V RIGHT Result Date: 10/31/2023 CLINICAL DATA:  Elective surgery. EXAM: DG HIP (WITH OR WITHOUT  PELVIS) 1V RIGHT COMPARISON:  Radiograph yesterday FINDINGS: Eight fluoroscopic spot views of the right hip submitted  from the operating room. Sequential images during right hip arthroplasty. Fluoroscopy time 10.2 seconds. Dose 0.9033 mGy. IMPRESSION: Intraoperative fluoroscopy during right hip arthroplasty. Electronically Signed   By: Andrea Gasman M.D.   On: 10/31/2023 11:30   DG C-Arm 1-60 Min-No Report Result Date: 10/31/2023 Fluoroscopy was utilized by the requesting physician.  No radiographic interpretation.   DG C-Arm 1-60 Min-No Report Result Date: 10/31/2023 Fluoroscopy was utilized by the requesting physician.  No radiographic interpretation.   DG Humerus Right Result Date: 10/30/2023 CLINICAL DATA:  Abrasion right arm after tripping and falling. EXAM: RIGHT HUMERUS - 2+ VIEW COMPARISON:  None Available. FINDINGS: There is no evidence of fracture or other focal bone lesions. Soft tissues are unremarkable. IMPRESSION: Negative. Electronically Signed   By: Camellia Candle M.D.   On: 10/30/2023 13:29   DG HIP UNILAT WITH PELVIS 2-3 VIEWS RIGHT Result Date: 10/30/2023 CLINICAL DATA:  [name] today while running a race. EXAM: DG HIP (WITH OR WITHOUT PELVIS) 2-3V RIGHT COMPARISON:  None Available. FINDINGS: There is a mildly displaced femoral neck fracture. The bony pelvis is intact. The left hip is intact. IMPRESSION: Mildly displaced right femoral neck fracture. Electronically Signed   By: MYRTIS Stammer M.D.   On: 10/30/2023 12:15   CT Head Wo Contrast Result Date: 10/30/2023 CLINICAL DATA:  [name].  Hit head. EXAM: CT HEAD WITHOUT CONTRAST CT CERVICAL SPINE WITHOUT CONTRAST TECHNIQUE: Multidetector CT imaging of the head and cervical spine was performed following the standard protocol without intravenous contrast. Multiplanar CT image reconstructions of the cervical spine were also generated. RADIATION DOSE REDUCTION: This exam was performed according to the departmental dose-optimization program which includes automated exposure control, adjustment of the mA and/or kV according to patient size and/or use of  iterative reconstruction technique. COMPARISON:  Prior head CT from 10/11/2022 FINDINGS: CT HEAD FINDINGS Brain: Stable age related cerebral atrophy, ventriculomegaly and periventricular white matter disease. No extra-axial fluid collections are identified. No CT findings for acute hemispheric infarction or intracranial hemorrhage. No mass lesions. The brainstem and cerebellum are normal. Vascular: Stable vascular calcifications. No aneurysm or hyperdense vessels. Skull: No skull fracture or bone lesions. Sinuses/Orbits: The paranasal sinuses and mastoid air cells are clear. Stable leftward deviation of the bony nasal septum. The globes are intact. Other: No scalp lesions or scalp hematoma. CT CERVICAL SPINE FINDINGS Alignment: Grossly normal. Mild degenerative anterior subluxation of C7 compared to T1. Skull base and vertebrae: No acute fracture. No primary bone lesion or focal pathologic process. Soft tissues and spinal canal: No prevertebral fluid or swelling. No visible canal hematoma. Disc levels: The spinal canal is fairly generous. No large disc protrusions or canal stenosis. Mild uncinate spurring and facet disease but no significant foraminal stenosis. Upper chest: The lung apices are grossly clear. Other: No neck mass or adenopathy or hematoma. Extensive bilateral carotid artery calcifications. IMPRESSION: 1. Stable age related cerebral atrophy, ventriculomegaly and periventricular white matter disease. 2. No acute intracranial findings or skull fracture. 3. Normal alignment of the cervical vertebral bodies and no acute fracture. Electronically Signed   By: MYRTIS Stammer M.D.   On: 10/30/2023 12:14   CT Cervical Spine Wo Contrast Result Date: 10/30/2023 CLINICAL DATA:  [name].  Hit head. EXAM: CT HEAD WITHOUT CONTRAST CT CERVICAL SPINE WITHOUT CONTRAST TECHNIQUE: Multidetector CT imaging of the head and cervical spine was performed following the standard protocol without  intravenous contrast. Multiplanar  CT image reconstructions of the cervical spine were also generated. RADIATION DOSE REDUCTION: This exam was performed according to the departmental dose-optimization program which includes automated exposure control, adjustment of the mA and/or kV according to patient size and/or use of iterative reconstruction technique. COMPARISON:  Prior head CT from 10/11/2022 FINDINGS: CT HEAD FINDINGS Brain: Stable age related cerebral atrophy, ventriculomegaly and periventricular white matter disease. No extra-axial fluid collections are identified. No CT findings for acute hemispheric infarction or intracranial hemorrhage. No mass lesions. The brainstem and cerebellum are normal. Vascular: Stable vascular calcifications. No aneurysm or hyperdense vessels. Skull: No skull fracture or bone lesions. Sinuses/Orbits: The paranasal sinuses and mastoid air cells are clear. Stable leftward deviation of the bony nasal septum. The globes are intact. Other: No scalp lesions or scalp hematoma. CT CERVICAL SPINE FINDINGS Alignment: Grossly normal. Mild degenerative anterior subluxation of C7 compared to T1. Skull base and vertebrae: No acute fracture. No primary bone lesion or focal pathologic process. Soft tissues and spinal canal: No prevertebral fluid or swelling. No visible canal hematoma. Disc levels: The spinal canal is fairly generous. No large disc protrusions or canal stenosis. Mild uncinate spurring and facet disease but no significant foraminal stenosis. Upper chest: The lung apices are grossly clear. Other: No neck mass or adenopathy or hematoma. Extensive bilateral carotid artery calcifications. IMPRESSION: 1. Stable age related cerebral atrophy, ventriculomegaly and periventricular white matter disease. 2. No acute intracranial findings or skull fracture. 3. Normal alignment of the cervical vertebral bodies and no acute fracture. Electronically Signed   By: MYRTIS Stammer M.D.   On: 10/30/2023 12:14   DG Chest Portable 1  View Result Date: 10/30/2023 CLINICAL DATA:  [name] during a 10 K race. EXAM: PORTABLE CHEST 1 VIEW COMPARISON:  06/11/2016 FINDINGS: The pacer wires are stable. The cardiac silhouette, mediastinal and hilar contours are normal. Stable mild tortuosity and calcification of the thoracic aorta. Moderate to large hiatal hernia again noted. The lungs are clear. No pulmonary contusion, pleural effusion or pneumothorax. The bony pelvis is intact.  No definite rib fractures. IMPRESSION: 1. No acute cardiopulmonary findings. 2. Moderate to large hiatal hernia. 3. No definite rib fractures. Electronically Signed   By: MYRTIS Stammer M.D.   On: 10/30/2023 12:05     Subjective: Patient seen ambulating steadily with mobility tech in the hallway.  Patient reports mild 3/10 right hip pain.  Denies any other complaints.  Feels comfortable discharging home.  Discharge Exam:  Vitals:   11/01/23 2026 11/02/23 0443 11/02/23 0728 11/02/23 1413  BP: 106/62 128/69 (!) 111/53 123/60  Pulse: 66 62 74 67  Resp: 18   17  Temp: 97.7 F (36.5 C) 98 F (36.7 C) 97.9 F (36.6 C) 98.1 F (36.7 C)  TempSrc: Oral Oral Oral Oral  SpO2: 98% 97% 99% 100%  Weight:      Height:        General exam: Elderly male, moderately built and nourished initially seen ambulating in the hallway without discomfort or difficulty and then seen sitting comfortably in the chair. Respiratory system: Clear to auscultation. Respiratory effort normal. Cardiovascular system: S1 & S2 heard, RRR. No JVD, murmurs, rubs, gallops or clicks. No pedal edema.  Telemetry findings as noted above. Gastrointestinal system: Abdomen is nondistended, soft and nontender. No organomegaly or masses felt. Normal bowel sounds heard. Central nervous system: Alert and oriented. No focal neurological deficits. Extremities: Symmetric 5 x 5 power.  Right hip postop dressing clean and  dry.  Stable without change. Skin: No rashes, lesions or ulcers Psychiatry: Judgement  and insight appear normal. Mood & affect appropriate.     The results of significant diagnostics from this hospitalization (including imaging, microbiology, ancillary and laboratory) are listed below for reference.     Microbiology: Recent Results (from the past 240 hours)  Surgical pcr screen     Status: None   Collection Time: 10/30/23  4:14 PM   Specimen: Nasal Mucosa; Nasal Swab  Result Value Ref Range Status   MRSA, PCR NEGATIVE NEGATIVE Final   Staphylococcus aureus NEGATIVE NEGATIVE Final    Comment: (NOTE) The Xpert SA Assay (FDA approved for NASAL specimens in patients 23 years of age and older), is one component of a comprehensive surveillance program. It is not intended to diagnose infection nor to guide or monitor treatment. Performed at [facility], [address], [place], [place] [address]      Labs: CBC: Recent Labs  Lab 10/30/23 1131 10/31/23 0730 11/01/23 0634 11/02/23 0445  WBC 10.9* 11.1* 13.8* 14.4*  NEUTROABS 9.0*  --   --   --   HGB 11.9* 12.3* 9.7* 9.8*  HCT 34.5* 35.8* 28.0* 27.5*  MCV 95.6 96.5 97.6 93.5  PLT 244 210 191 207    Basic Metabolic Panel: Recent Labs  Lab 10/30/23 1131 10/31/23 0730 11/01/23 0634 11/02/23 0445  NA 141 136 136 134*  K 5.2* 4.2 4.3 4.7  CL 106 104 105 104  CO2 17* 20* 19* 23  GLUCOSE 110* 140* 124* 124*  BUN 26* 22 21 35*  CREATININE 1.50* 1.22 1.21 1.57*  CALCIUM  10.0 9.3 9.0 9.4  MG  --   --   --  1.6*    Liver Function Tests: Recent Labs  Lab 10/30/23 1131 11/02/23 0445  AST 33 29  ALT 22 9  ALKPHOS 48 42  BILITOT 2.0* 1.2  PROT 6.3* 5.8*  ALBUMIN 4.1 3.1*    CBG: Recent Labs  Lab 11/01/23 1627 11/01/23 2011 11/01/23 2314 11/02/23 0436 11/02/23 1138  GLUCAP 291* 275* 144* 111* 223*    Hgb A1c Recent Labs    10/30/23 1903  HGBA1C 6.2*     Discussed in detail with patient's daughter via phone, updated care and answered all questions.  Time coordinating discharge:  40 minutes  SIGNED:  Trenda Mar, MD,  FACP, [facility], [facility], [facility]   [facility] [place]     To contact the attending provider between 7A-7P or the covering provider during after hours 7P-7A, please log into the web site [email or web address] and access using universal [place] password for that web site. If you do not have the password, please call the hospital operator.

## 2023-11-03 ENCOUNTER — Ambulatory Visit: Payer: Self-pay | Admitting: Cardiovascular Disease

## 2023-11-04 ENCOUNTER — Other Ambulatory Visit (HOSPITAL_BASED_OUTPATIENT_CLINIC_OR_DEPARTMENT_OTHER): Payer: Self-pay

## 2023-11-05 NOTE — Telephone Encounter (Signed)
 Lmom for patient to see if he is still coming today

## 2023-11-09 ENCOUNTER — Other Ambulatory Visit: Payer: Self-pay

## 2023-11-09 ENCOUNTER — Inpatient Hospital Stay (HOSPITAL_COMMUNITY)
Admission: AD | Admit: 2023-11-09 | Discharge: 2023-11-21 | DRG: 945 | Disposition: A | Discharge status: Home / Self Care | Source: Other Acute Inpatient Hospital | Attending: Physical Medicine and Rehabilitation | Admitting: Physical Medicine and Rehabilitation

## 2023-11-09 ENCOUNTER — Encounter (HOSPITAL_COMMUNITY): Payer: Self-pay | Admitting: Physical Medicine and Rehabilitation

## 2023-11-09 ENCOUNTER — Other Ambulatory Visit: Payer: Self-pay | Admitting: Physician Assistant

## 2023-11-09 DIAGNOSIS — W19XXXD Unspecified fall, subsequent encounter: Secondary | ICD-10-CM | POA: Diagnosis present

## 2023-11-09 DIAGNOSIS — L89156 Pressure-induced deep tissue damage of sacral region: Secondary | ICD-10-CM | POA: Diagnosis present

## 2023-11-09 DIAGNOSIS — R4189 Other symptoms and signs involving cognitive functions and awareness: Secondary | ICD-10-CM | POA: Diagnosis not present

## 2023-11-09 DIAGNOSIS — K5900 Constipation, unspecified: Secondary | ICD-10-CM | POA: Diagnosis present

## 2023-11-09 DIAGNOSIS — Z800 Family history of malignant neoplasm of digestive organs: Secondary | ICD-10-CM | POA: Diagnosis not present

## 2023-11-09 DIAGNOSIS — K5901 Slow transit constipation: Secondary | ICD-10-CM | POA: Diagnosis not present

## 2023-11-09 DIAGNOSIS — E119 Type 2 diabetes mellitus without complications: Secondary | ICD-10-CM | POA: Diagnosis present

## 2023-11-09 DIAGNOSIS — D62 Acute posthemorrhagic anemia: Secondary | ICD-10-CM | POA: Diagnosis present

## 2023-11-09 DIAGNOSIS — R5381 Other malaise: Secondary | ICD-10-CM | POA: Diagnosis present

## 2023-11-09 DIAGNOSIS — Z79899 Other long term (current) drug therapy: Secondary | ICD-10-CM | POA: Diagnosis not present

## 2023-11-09 DIAGNOSIS — R739 Hyperglycemia, unspecified: Secondary | ICD-10-CM | POA: Diagnosis not present

## 2023-11-09 DIAGNOSIS — Z87891 Personal history of nicotine dependence: Secondary | ICD-10-CM

## 2023-11-09 DIAGNOSIS — L89316 Pressure-induced deep tissue damage of right buttock: Secondary | ICD-10-CM | POA: Diagnosis present

## 2023-11-09 DIAGNOSIS — L89626 Pressure-induced deep tissue damage of left heel: Secondary | ICD-10-CM | POA: Diagnosis present

## 2023-11-09 DIAGNOSIS — E1122 Type 2 diabetes mellitus with diabetic chronic kidney disease: Secondary | ICD-10-CM | POA: Diagnosis present

## 2023-11-09 DIAGNOSIS — L89326 Pressure-induced deep tissue damage of left buttock: Secondary | ICD-10-CM | POA: Diagnosis present

## 2023-11-09 DIAGNOSIS — I2510 Atherosclerotic heart disease of native coronary artery without angina pectoris: Secondary | ICD-10-CM | POA: Diagnosis present

## 2023-11-09 DIAGNOSIS — Z950 Presence of cardiac pacemaker: Secondary | ICD-10-CM | POA: Diagnosis not present

## 2023-11-09 DIAGNOSIS — Z7901 Long term (current) use of anticoagulants: Secondary | ICD-10-CM | POA: Diagnosis not present

## 2023-11-09 DIAGNOSIS — Z794 Long term (current) use of insulin: Secondary | ICD-10-CM | POA: Diagnosis not present

## 2023-11-09 DIAGNOSIS — I959 Hypotension, unspecified: Secondary | ICD-10-CM | POA: Diagnosis present

## 2023-11-09 DIAGNOSIS — I129 Hypertensive chronic kidney disease with stage 1 through stage 4 chronic kidney disease, or unspecified chronic kidney disease: Secondary | ICD-10-CM | POA: Diagnosis present

## 2023-11-09 DIAGNOSIS — L89616 Pressure-induced deep tissue damage of right heel: Secondary | ICD-10-CM | POA: Diagnosis present

## 2023-11-09 DIAGNOSIS — H409 Unspecified glaucoma: Secondary | ICD-10-CM | POA: Diagnosis present

## 2023-11-09 DIAGNOSIS — Z7984 Long term (current) use of oral hypoglycemic drugs: Secondary | ICD-10-CM

## 2023-11-09 DIAGNOSIS — Z6820 Body mass index (BMI) 20.0-20.9, adult: Secondary | ICD-10-CM

## 2023-11-09 DIAGNOSIS — Z8739 Personal history of other diseases of the musculoskeletal system and connective tissue: Secondary | ICD-10-CM

## 2023-11-09 DIAGNOSIS — N1831 Chronic kidney disease, stage 3a: Secondary | ICD-10-CM | POA: Diagnosis present

## 2023-11-09 DIAGNOSIS — S72001D Fracture of unspecified part of neck of right femur, subsequent encounter for closed fracture with routine healing: Secondary | ICD-10-CM

## 2023-11-09 DIAGNOSIS — M150 Primary generalized (osteo)arthritis: Secondary | ICD-10-CM

## 2023-11-09 DIAGNOSIS — I495 Sick sinus syndrome: Secondary | ICD-10-CM | POA: Diagnosis present

## 2023-11-09 DIAGNOSIS — S72001S Fracture of unspecified part of neck of right femur, sequela: Secondary | ICD-10-CM | POA: Diagnosis not present

## 2023-11-09 DIAGNOSIS — E785 Hyperlipidemia, unspecified: Secondary | ICD-10-CM | POA: Diagnosis present

## 2023-11-09 DIAGNOSIS — D75838 Other thrombocytosis: Secondary | ICD-10-CM | POA: Diagnosis not present

## 2023-11-09 DIAGNOSIS — Z87442 Personal history of urinary calculi: Secondary | ICD-10-CM | POA: Diagnosis not present

## 2023-11-09 DIAGNOSIS — S72001A Fracture of unspecified part of neck of right femur, initial encounter for closed fracture: Secondary | ICD-10-CM | POA: Diagnosis not present

## 2023-11-09 DIAGNOSIS — I10 Essential (primary) hypertension: Secondary | ICD-10-CM | POA: Diagnosis not present

## 2023-11-09 DIAGNOSIS — Z96641 Presence of right artificial hip joint: Secondary | ICD-10-CM | POA: Diagnosis present

## 2023-11-09 DIAGNOSIS — E440 Moderate protein-calorie malnutrition: Secondary | ICD-10-CM | POA: Diagnosis present

## 2023-11-09 LAB — GLUCOSE, CAPILLARY
Glucose-Capillary: 145 mg/dL — ABNORMAL HIGH (ref 70–99)
Glucose-Capillary: 158 mg/dL — ABNORMAL HIGH (ref 70–99)

## 2023-11-09 MED ORDER — POLYETHYLENE GLYCOL 3350 17 G PO PACK
17.0000 g | PACK | Freq: Every day | ORAL | Status: DC
  Filled 2023-11-09: qty 1

## 2023-11-09 MED ORDER — POLYETHYLENE GLYCOL 3350 17 G PO PACK
17.0000 g | PACK | Freq: Every day | ORAL | Status: DC | PRN
Start: 2023-11-09 — End: 2023-11-21
  Administered 2023-11-10: 17 g via ORAL
  Filled 2023-11-09: qty 1

## 2023-11-09 MED ORDER — APIXABAN 2.5 MG PO TABS
2.5000 mg | ORAL_TABLET | Freq: Two times a day (BID) | ORAL | Status: DC
  Administered 2023-11-09 – 2023-11-18 (×18): 2.5 mg via ORAL
  Filled 2023-11-09 (×18): qty 1

## 2023-11-09 MED ORDER — INSULIN ASPART 100 UNIT/ML IJ SOLN
0.0000 [IU] | Freq: Three times a day (TID) | INTRAMUSCULAR | Status: DC
  Administered 2023-11-09: 2 [IU] via SUBCUTANEOUS
  Administered 2023-11-10: 3 [IU] via SUBCUTANEOUS
  Administered 2023-11-10: 2 [IU] via SUBCUTANEOUS
  Administered 2023-11-10 – 2023-11-11 (×2): 3 [IU] via SUBCUTANEOUS
  Administered 2023-11-11 – 2023-11-13 (×6): 2 [IU] via SUBCUTANEOUS
  Administered 2023-11-13 – 2023-11-14 (×3): 3 [IU] via SUBCUTANEOUS
  Administered 2023-11-15 (×2): 2 [IU] via SUBCUTANEOUS
  Administered 2023-11-16: 3 [IU] via SUBCUTANEOUS
  Administered 2023-11-16: 2 [IU] via SUBCUTANEOUS
  Administered 2023-11-17 – 2023-11-18 (×3): 3 [IU] via SUBCUTANEOUS
  Administered 2023-11-18: 2 [IU] via SUBCUTANEOUS
  Administered 2023-11-19: 5 [IU] via SUBCUTANEOUS
  Administered 2023-11-19 – 2023-11-20 (×2): 2 [IU] via SUBCUTANEOUS
  Administered 2023-11-20: 3 [IU] via SUBCUTANEOUS
  Filled 2023-11-09 (×2): qty 3
  Filled 2023-11-09 (×2): qty 2
  Filled 2023-11-09: qty 3
  Filled 2023-11-09: qty 5
  Filled 2023-11-09 (×2): qty 2

## 2023-11-09 MED ORDER — BRIMONIDINE TARTRATE 0.2 % OP SOLN
1.0000 [drp] | Freq: Two times a day (BID) | OPHTHALMIC | Status: DC
  Administered 2023-11-09 – 2023-11-21 (×24): 1 [drp] via OPHTHALMIC
  Filled 2023-11-09: qty 5

## 2023-11-09 MED ORDER — ATORVASTATIN CALCIUM 40 MG PO TABS
40.0000 mg | ORAL_TABLET | Freq: Every day | ORAL | Status: DC
  Administered 2023-11-10 – 2023-11-21 (×12): 40 mg via ORAL
  Filled 2023-11-09 (×12): qty 1

## 2023-11-09 MED ORDER — VITAMIN D 25 MCG (1000 UNIT) PO TABS
1000.0000 [IU] | ORAL_TABLET | Freq: Every day | ORAL | Status: DC
  Administered 2023-11-10 – 2023-11-21 (×12): 1000 [IU] via ORAL
  Filled 2023-11-09 (×12): qty 1

## 2023-11-09 MED ORDER — MELATONIN 3 MG PO TABS
3.0000 mg | ORAL_TABLET | Freq: Every day | ORAL | Status: DC
  Administered 2023-11-09 – 2023-11-20 (×12): 3 mg via ORAL
  Filled 2023-11-09 (×12): qty 1

## 2023-11-09 MED ORDER — METHOCARBAMOL 500 MG PO TABS
500.0000 mg | ORAL_TABLET | Freq: Three times a day (TID) | ORAL | Status: DC
  Administered 2023-11-09 – 2023-11-21 (×35): 500 mg via ORAL
  Filled 2023-11-09 (×36): qty 1

## 2023-11-09 MED ORDER — HYDROCODONE-ACETAMINOPHEN 5-325 MG PO TABS
1.0000 | ORAL_TABLET | Freq: Four times a day (QID) | ORAL | Status: DC | PRN
  Administered 2023-11-09 – 2023-11-18 (×5): 1 via ORAL
  Filled 2023-11-09 (×5): qty 1

## 2023-11-09 MED ORDER — GLUCERNA SHAKE PO LIQD
237.0000 mL | Freq: Two times a day (BID) | ORAL | Status: DC
  Administered 2023-11-09 – 2023-11-20 (×13): 237 mL via ORAL

## 2023-11-09 MED ORDER — SENNOSIDES-DOCUSATE SODIUM 8.6-50 MG PO TABS
1.0000 | ORAL_TABLET | Freq: Two times a day (BID) | ORAL | Status: DC
  Administered 2023-11-09 – 2023-11-11 (×4): 1 via ORAL
  Filled 2023-11-09 (×4): qty 1

## 2023-11-09 MED ORDER — ACETAMINOPHEN 325 MG PO TABS
650.0000 mg | ORAL_TABLET | Freq: Four times a day (QID) | ORAL | Status: DC | PRN
  Administered 2023-11-11 – 2023-11-16 (×4): 650 mg via ORAL
  Filled 2023-11-09 (×4): qty 2

## 2023-11-09 MED ORDER — AMLODIPINE BESYLATE 5 MG PO TABS
5.0000 mg | ORAL_TABLET | Freq: Every day | ORAL | Status: DC
  Administered 2023-11-10 – 2023-11-16 (×7): 5 mg via ORAL
  Filled 2023-11-09 (×7): qty 1

## 2023-11-09 MED ORDER — EZETIMIBE 10 MG PO TABS
10.0000 mg | ORAL_TABLET | Freq: Every day | ORAL | Status: DC
  Administered 2023-11-10 – 2023-11-21 (×12): 10 mg via ORAL
  Filled 2023-11-09 (×12): qty 1

## 2023-11-09 NOTE — Discharge Instructions (Addendum)
 Inpatient Rehab Discharge Instructions  Travis [name] Discharge date and time: No discharge date for patient encounter.   Activities/Precautions/ Functional Status: Activity: activity as tolerated Diet: diabetic diet Wound Care: Routine skin checks Functional status:  ___ No restrictions     ___ Walk up steps independently ___ 24/7 supervision/assistance   ___ Walk up steps with assistance ___ Intermittent supervision/assistance  ___ Bathe/dress independently ___ Walk with walker     _x__ Bathe/dress with assistance ___ Walk Independently    ___ Shower independently ___ Walk with assistance    ___ Shower with assistance ___ No alcohol     ___ Return to work/school ________  Special Instructions: No driving smoking or alcohol  Wound care.  Cleanse coccyx/buttocks wound with Vashe, do not rinse and allow to air dry.  Allow 1/4 inch thick layer of Santyl to wound bed daily, top with saline moistened gauze, dry gauze and silicone foam.  May lift foam daily to replace santyl, change foam every 3 days and as needed soiling  Left great toe and heel: Cleanse with saline, pat dry. Apply a single layer of Xeroform to wound bed, top with foam dressing. It is ok to lift and reapply the Xeroform.   COMMUNITY REFERRALS UPON DISCHARGE:    Outpatient: PT      OT              Agency: [facility] location   Phone:  [phone]              Appointment Date/Time:*Please expect follow-up within 7-10 business days to schedule your appointment. If you have not received follow-up, be sure to contact the site directly.*   Medical Equipment/Items Ordered:rollator and tub transfer bench                                                 Agency/Supplier: [employer] [phone]    My questions have been answered and I understand these instructions. I will adhere to these goals and the provided educational materials after my discharge from the hospital.  Patient/Caregiver Signature  _______________________________ Date __________  Clinician Signature _______________________________________ Date __________  Please bring this form and your medication list with you to all your follow-up doctor's appointments.

## 2023-11-09 NOTE — Progress Notes (Incomplete)
 Physical Medicine and Rehabilitation Admission H&P     HPI: Travis [name] is a 83 year old right-handed male with history of kidney stones, hyperlipidemia, hypertension, CAD presence of permanent cardiac pacemaker/SSS followed by cardiology services Dr.Croitoru maintained on chronic Eliquis , type 2 diabetes mellitus, ventricular tachycardia as well as recent right total hip arthroplasty 10/31/2023 per Dr. Donnice Car.  Presented to [facility] [place]  11/04/2023 with hypotension with systolic in the 80s.  In the ED was noted to have a creatinine of 2.5 whereas baseline is usually around 1.2 WBC 11,000 hemoglobin 7.8 and recent discharge after hip replacement of 9.5.  Chest x-ray showed evidence of atelectasis.  CT of the chest showed nonspecific airspace disease with atelectasis left lung base.  Patchy left basilar opacities concerning for possible infection.  Hospital stay he did receive 1 unit packed red blood cells for transfusion.  He was placed on IV fluids for gentle hydration.  Therapy evaluations completed patient weightbearing as tolerated right lower extremity and was admitted for a comprehensive rehab program.  Review of Systems  Constitutional:  Negative for chills and fever.  HENT:  Negative for hearing loss.   Eyes:  Negative for blurred vision.  Respiratory:  Negative for cough, shortness of breath and wheezing.   Cardiovascular:  Positive for palpitations. Negative for leg swelling and PND.  Gastrointestinal:  Positive for constipation. Negative for heartburn, nausea and vomiting.  Genitourinary:  Positive for urgency. Negative for dysuria, flank pain and hematuria.  Musculoskeletal:  Positive for joint pain and myalgias.  Skin:  Negative for rash.  Neurological:  Positive for dizziness and weakness.  All other systems reviewed and are negative.  Past Medical History:  Diagnosis Date   Heart murmur    an innocent one (06/10/2016)   History of  blood transfusion ~ 1956   while I was in hospital; don't remember why   History of kidney stones    Hyperlipidemia    Hypertension    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   SSS (sick sinus syndrome) (HCC) 06/10/2016   Type II diabetes mellitus (HCC)    Past Surgical History:  Procedure Laterality Date   APPENDECTOMY     CARDIAC CATHETERIZATION  06/10/2016   CARDIOVERSION N/A 10/01/2023   Procedure: CARDIOVERSION;  Surgeon: Francyne Headland, MD;  Location: [facility] INVASIVE CV LAB;  Service: Cardiovascular;  Laterality: N/A;   CATARACT EXTRACTION W/ INTRAOCULAR LENS IMPLANT Right    CYSTOSCOPY W/ STONE MANIPULATION     INSERT / REPLACE / REMOVE PACEMAKER  06/10/2016   dual chamber permanent pacemaker   KNEE ARTHROSCOPY W/ MENISCECTOMY Bilateral    LAPAROSCOPIC CHOLECYSTECTOMY     LEFT HEART CATH AND CORONARY ANGIOGRAPHY N/A 06/10/2016   Procedure: Left Heart Cath and Coronary Angiography;  Surgeon: Cherrie Toribio SAUNDERS, MD;  Location: [facility] INVASIVE CV LAB;  Service: Cardiovascular;  Laterality: N/A;   LITHOTRIPSY     PACEMAKER IMPLANT N/A 06/10/2016   Procedure: Pacemaker Implant;  Surgeon: Francyne Headland, MD;  Location: [facility] INVASIVE CV LAB;  Service: Cardiovascular;  Laterality: N/A;   TONSILLECTOMY     TOTAL HIP ARTHROPLASTY Right 10/31/2023   Procedure: RIGHT TOTAL HIP ARTHROPLASTY,  ANTERIOR APPROACH;  Surgeon: Car Donnice, MD;  Location[facility];  Service: Orthopedics;  Laterality: Right;   Family History  Problem Relation Age of Onset   Colon cancer Father    Social History:  reports that he quit smoking about 42 years ago. His smoking use included cigarettes.  He started smoking about 67 years ago. He has a 12.5 pack-year smoking history. He has never used smokeless tobacco. He reports current alcohol use. He reports that he does not use drugs. Allergies: No Known Allergies (Not in a hospital admission)     Home:     Functional History:    Functional Status:   Mobility:          ADL:    Cognition:      Physical Exam: There were no vitals taken for this visit. Physical Exam Skin:    Comments: Hip incision clean and dry  Neurological:     Comments: Patient is alert and oriented x 3 following commands.     No results found for this or any previous visit (from the past 48 hours). No results found.    There were no vitals taken for this visit.  Medical Problem List and Plan: 1. Functional deficits secondary to debility/orthostasis after right total hip arthroplasty 10/31/2023.  Weightbearing as tolerated  -patient may *** shower  -ELOS/Goals: *** 2.  Antithrombotics: -DVT/anticoagulation:  Pharmaceutical: Eliquis   -antiplatelet therapy: N/A 3. Pain Management: Robaxin 500 mg 3 times daily, hydrocodone 5-325 mg 1 tablet every 6 hours as needed 4. Mood/Behavior/Sleep: Melatonin 3 mg nightly  -antipsychotic agents: N/A 5. Neuropsych/cognition: This patient is capable of making decisions on his own behalf. 6. Skin/Wound Care: Routine skin checks 7. Fluids/Electrolytes/Nutrition: Routine in and outs with follow-up chemistries 8.  Acute blood loss anemia.  Transfused 1 unit packed red blood cells follow-up CBC 9.  Hypotension.  Norvasc  5 mg daily.  HCTZ 12.5 mg daily and lisinopril  20 mg daily currently on hold and resume as needed.  Monitor with increased mobility 10.  Hyperlipidemia.  Lipitor/Zetia  11.  Constipation.  MiraLAX daily, Senokot-S 1 tablet twice daily 12.  Diabetes mellitus.  SSI.  Hemoglobin A1c 6.2 13.  CAD/permanent pacemaker/ventricular tachycardia.  Continue Eliquis .  Follow-up cardiology services Dr.Crotoru 14.  AKI.  Received gentle IV fluids.  Follow-up chemistries Toribio JINNY Pitch, PA-C 11/09/2023

## 2023-11-09 NOTE — Progress Notes (Signed)
 PMR Admission Coordinator Pre-Admission Assessment   Patient: Travis [name] is an 83 y.o., male MRN: [id number] DOB: [birth year] Height:   Weight:     Insurance Information HMO:     PPO:      PCP:      IPA:      80/20: yes     OTHER:  PRIMARY: Medicare a and b      Policy#:  [id number]    Subscriber: pt Benefits:  Phone #: passport one source     Name:  Eff. Date: A 04/12/2004 and B 01/13/2012  Deduct: $1676      Out of Pocket Max: none      Life Max: none CIR: 100%      SNF: 20 full days Outpatient: 80%     Co-Pay: 20% Home Health: 100%      Co-Pay:  DME: 80%     Co-Pay: 20% Providers: pt choice SECONDARY: Generic Commercial (Supplement)      Policy#:  [id number]          Phone#:    Financial Counselor:       Phone#:    The "Data Collection Information Summary" for patients in Inpatient Rehabilitation Facilities with attached "Privacy Act Statement-Health Care Records" was provided and verbally reviewed with: Patient   Emergency Contact Information Contact Information       Name Relation Home Work Mobile    Beitzel,Cynthia Daughter [phone]   [phone]    Kerr,Shannon Daughter [phone]   [phone]         Other Contacts   None on File        Current Medical History  Patient Admitting Diagnosis: Hip fracture s/p THA, AKI, Anemia  History of Present Illness: Travis [name] is a 83 y.o. male with past medical history  of sick sinus syndrome status post pacemaker/paroxysmal A-fib on Eliquis , CAD, CKD stage IIIa, diabetes mellitus type 2, glaucoma who presented to [facility] on 10/30/23 after sustaining a fall towards the end of his 10K race. Pt.  fell on his right side and hurt his right hip and right elbow with multiple bruises and lacerations.   Patient found to have a right femoral neck fracture with mild displacement.Orthopedics was consulted and s/p right total hip arthroplasty 10/19  Postoperatively pt  weightbearing as tolerated. He was discharged home  with assistance from his daughter. At home, Pt. Had increasing weakness and presented to [facility] on 11/05/23. He was admitted for dehydration and failure to thrive with AKI (Cr 2.5 on admission, dropped to 1.30 11/08/23) and hypotension. He was also noted to have worsening anemia likely related to the original blood loss from his surgery.  He was given 1 unit of PRBCs at [facility] (HgB 7.8 on admission, rose to 8.5 1/27). Pt. Was given IV fluids and AKI improved. Chest x ray also showed evidence of atelectasis.    Patient's medical record from [facility]  has been reviewed by the rehabilitation admission coordinator and physician. Past Medical History      Past Medical History:  Diagnosis Date   Heart murmur      an innocent one (06/10/2016)   History of blood transfusion ~ 1956    while I was in hospital; don't remember why   History of kidney stones     Hyperlipidemia     Hypertension     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   SSS (sick sinus syndrome) (HCC) 06/10/2016  Type II diabetes mellitus (HCC)            Has the patient had major surgery during 100 days prior to admission? Yes   Family History   family history includes Colon cancer in his father.   Current Medications  Current Medications    Current Outpatient Medications:    acetaminophen  (TYLENOL ) 500 MG tablet, Take 1-2 tablets (500-1,000 mg total) by mouth every 8 (eight) hours as needed for mild pain (pain score 1-3)., Disp: , Rfl:    amLODipine  (NORVASC ) 5 MG tablet, Take 1 tablet (5 mg total) by mouth daily., Disp: 90 tablet, Rfl: 3   apixaban  (ELIQUIS ) 5 MG TABS tablet, Take 1 tablet (5 mg total) by mouth 2 (two) times daily., Disp: 60 tablet, Rfl: 6   atorvastatin  (LIPITOR) 40 MG tablet, Take 1 tablet (40 mg total) by mouth daily., Disp: 90 tablet, Rfl: 3   Blood Glucose Monitoring Suppl (ONETOUCH VERIO FLEX SYSTEM) w/Device KIT, Use to test blood sugars  2-3x a week, Disp: , Rfl:    brimonidine -timolol  (COMBIGAN ) 0.2-0.5 % ophthalmic solution, Place 1 drop into both eyes 2 (two) times daily., Disp: 15 mL, Rfl: 5   Cholecalciferol (VITAMIN D3) 2000 units capsule, Take 2,000 Units by mouth daily., Disp: , Rfl:    ezetimibe  (ZETIA ) 10 MG tablet, Take 1 tablet (10 mg total) by mouth daily., Disp: 90 tablet, Rfl: 2   glucose blood (ONETOUCH VERIO) test strip, Use to test blood sugars 2-3x a week E11.9, Disp: , Rfl:    [Paused] hydrochlorothiazide  (MICROZIDE ) 12.5 MG capsule, Take 1 capsule (12.5 mg total) by mouth daily., Disp: 90 capsule, Rfl: 3   [Paused] lisinopril  (ZESTRIL ) 20 MG tablet, Take 1 tablet (20 mg total) by mouth daily., Disp: 90 tablet, Rfl: 3   metFORMIN  (GLUCOPHAGE ) 500 MG tablet, Take 1 tablet (500 mg total) by mouth 2 (two) times daily., Disp: 180 tablet, Rfl: 2   polyethylene glycol powder (GLYCOLAX/MIRALAX) 17 GM/SCOOP powder, Dissolve 1 capful (17g) in 4-8 ounces of liquid and take by mouth daily., Disp: 238 g, Rfl: 0   senna (SENOKOT) 8.6 MG TABS tablet, Take 1 tablet (8.6 mg total) by mouth at bedtime as needed for mild constipation or moderate constipation., Disp: 30 tablet, Rfl: 0   traMADol (ULTRAM) 50 MG tablet, Take 1 tablet (50 mg total) by mouth every 8 (eight) hours as needed for moderate pain (pain score 4-6) or severe pain (pain score 7-10)., Disp: 10 tablet, Rfl: 0     Patients Current Diet: Diet Regular, Thin   Precautions / Restrictions Precautions: Fall Weight Bearing Restrictions Per Provider Order: Yes RLE Weight Bearing Per Provider Order: WBAT     Has the patient had 2 or more falls or a fall with injury in the past year? Yes   Prior Activity Level Community (5-7x/wk): Pt. active in the community PTA     Prior Functional Level Self Care: Did the patient need help bathing, dressing, using the toilet or eating? Independent   Indoor Mobility: Did the patient need assistance with walking from room to  room (with or without device)? Independent   Stairs: Did the patient need assistance with internal or external stairs (with or without device)? Independent   Functional Cognition: Did the patient need help planning regular tasks such as shopping or remembering to take medications? Independent   Patient Information Are you of Hispanic, Latino/a,or Spanish origin?: A. No, not of Hispanic, Latino/a, or Spanish origin What is your race?:  A. White Do you need or want an interpreter to communicate with a doctor or health care staff?: 0. No   Patient's Response To:  Health Literacy and Transportation Is the patient able to respond to health literacy and transportation needs?: Yes Health Literacy - How often do you need to have someone help you when you read instructions, pamphlets, or other written material from your doctor or pharmacy?: Never In the past 12 months, has lack of transportation kept you from medical appointments or from getting medications?: No In the past 12 months, has lack of transportation kept you from meetings, work, or from getting things needed for daily living?: No   Home Assistive Devices / Equipment   Prior Device Use: Indicate devices/aids used by the patient prior to current illness, exacerbation or injury? None of the above     Prior Functional Level Current Functional Level  Bed Mobility   Independent Min assist    Transfers   Independent   Mod assist    Mobility - Walk/Wheelchair   Independent   Other    Upper Body Dressing   Independent   Min assist    Lower Body Dressing   Independent   Max assist    Grooming   Independent   Min assist    Eating/Drinking   Independent   Min assist    Toilet Transfer   Independent   Mod assist    Bladder Continence    Continent   Continent    Bowel Management   Continent   Continent    Stair Chartered Loss Adjuster   Other    Communication   independent   Independent    Memory    independent Independent      Special considerations/life events Special service needs nonr   Previous Home Environment (from acute therapy documentation) Living Arrangements: Alone Available Help at Discharge: Family Type of Home: House Home Layout: Two level Home Access: Stairs to enter Entrance Stairs-Rails: None Entrance Stairs-Number of Steps: 1 Bathroom Shower/Tub: Health Visitor: Standard Home Care Services: No Additional Comments: Above information is for daughter's home where he will be d/c initially.     Discharge Living Setting Plans for Discharge Living Setting: Patient's home Type of Home at Discharge: House Discharge Home Layout: Two level; Able to live on main level with bedroom/bathroom Discharge Home Access: Stairs to enter Entrance Stairs-Rails: None Entrance Stairs-Number of Steps: 3 Discharge Bathroom Shower/Tub: Walk-in shower Discharge Bathroom Toilet: Standard Discharge Bathroom Accessibility: Yes How Accessible: Accessible via walker; Accessible via wheelchair Does the patient have any problems obtaining your medications?: No     Social/Family/Support Systems Patient Roles: Other (Comment) Contact Information: [phone] Anticipated Caregiver: [name] (daughter) Anticipated Caregiver's Contact Information: Daughter may hire care or have other family assist when she has to leave. She does not work Ability/Limitations of Caregiver: Min A Caregiver Availability: 24/7 Discharge Plan Discussed with Primary Caregiver: Yes Is Caregiver In Agreement with Plan?: Yes Does Caregiver/Family have Issues with Lodging/Transportation while Pt is in Rehab?: No     Goals Patient/Family Goal for Rehab: PT/OT Supervision Expected length of stay: 12-14 days Pt/Family Agrees to Admission and willing to participate: Yes Program Orientation Provided & Reviewed with Pt/Caregiver Including Roles  & Responsibilities: Yes     Decrease burden of Care  through IP rehab admission: Not anticipated   Possible need for SNF placement upon discharge: Not anticipated   Patient Condition: I have reviewed medical records from [facility], spoken with CM, and patient and daughter. I met with patient at the bedside for inpatient rehabilitation assessment.  Patient will benefit from ongoing PT and OT, can actively participate in 3 hours of therapy a day 5 days of the week, and can make measurable gains during the admission.  Patient will also benefit from the coordinated team approach during an Inpatient Acute Rehabilitation admission.  The patient will receive intensive therapy as well as Rehabilitation physician, nursing, social worker, and care management interventions.  Due to safety, skin/wound care, disease management, medication administration, pain management, and patient education the patient requires 24 hour a day rehabilitation nursing.  The patient is currently mod A with mobility and basic ADLs.  Discharge setting and therapy post discharge at home with home health is anticipated.  Patient has agreed to participate in the Acute Inpatient Rehabilitation Program and will admit today.   Preadmission Screen Completed By:  Leita KATHEE Kleine, 11/09/2023 8:05 AM ______________________________________________________________________   Discussed status with Dr. Lovorn on 11/09/23 at 950 and received approval for admission today.   Admission Coordinator:  Leita KATHEE Kleine RAEJEAN, time 952 Date 11/09/23    Assessment/Plan: Diagnosis: R femoral neck fx s/p R THA Does the need for close, 24 hr/day Medical supervision in concert with the patient's rehab needs make it unreasonable for this patient to be served in a less intensive setting? Yes Co-Morbidities requiring supervision/potential complications: AKI, anemia s/p 1 unit pRBCs, failure ot thrive, Sick sinus syndrome, Afib on Eliquis ; DM2, CAD, CKD3a, glaucoma Due to bladder management,  bowel management, safety, skin/wound care, disease management, medication administration, pain management, and patient education, does the patient require 24 hr/day rehab nursing? Yes Does the patient require coordinated care of a physician, rehab nurse, PT, OT, and SLP to address physical and functional deficits in the context of the above medical diagnosis(es)? Yes Addressing deficits in the following areas: balance, endurance, locomotion, strength, transferring, bowel/bladder control, bathing, dressing, feeding, grooming, and toileting Can the patient actively participate in an intensive therapy program of at least 3 hrs of therapy 5 days a week? Yes The potential for patient to make measurable gains while on inpatient rehab is good Anticipated functional outcomes upon discharge from inpatient rehab: supervision PT, supervision OT, n/a SLP Estimated rehab length of stay to reach the above functional goals is: 12-14 days Anticipated discharge destination: Home 10. Overall Rehab/Functional Prognosis: good   MD Signature

## 2023-11-09 NOTE — Plan of Care (Signed)
  Problem: Consults Goal: RH GENERAL PATIENT EDUCATION Description: See Patient Education module for education specifics. Outcome: Progressing Goal: Nutrition Consult-if indicated Outcome: Progressing Goal: Diabetes Guidelines if Diabetic/Glucose > 140 Description: If diabetic or lab glucose is > 140 mg/dl - Initiate Diabetes/Hyperglycemia Guidelines & Document Interventions  Outcome: Progressing   Problem: RH BOWEL ELIMINATION Goal: RH STG MANAGE BOWEL WITH ASSISTANCE Description: STG Manage Bowel with mod I Assistance. Outcome: Progressing Goal: RH STG MANAGE BOWEL W/MEDICATION W/ASSISTANCE Description: STG Manage Bowel with Medication with mod I Assistance. Outcome: Progressing   Problem: RH SAFETY Goal: RH STG ADHERE TO SAFETY PRECAUTIONS W/ASSISTANCE/DEVICE Description: STG Adhere to Safety Precautions With cues Assistance/Device. Outcome: Progressing   Problem: RH PAIN MANAGEMENT Goal: RH STG PAIN MANAGED AT OR BELOW PT'S PAIN GOAL Description: Pain < 4 with prns Outcome: Progressing   Problem: RH KNOWLEDGE DEFICIT GENERAL Goal: RH STG INCREASE KNOWLEDGE OF SELF CARE AFTER HOSPITALIZATION Description: Patient and dtr will be able to manage care at discharge using educational resources for medications and dietary modification independently Outcome: Progressing   Problem: Education: Goal: Ability to describe self-care measures that may prevent or decrease complications (Diabetes Survival Skills Education) will improve Outcome: Progressing Goal: Individualized Educational Video(s) Outcome: Progressing   Problem: Coping: Goal: Ability to adjust to condition or change in health will improve Outcome: Progressing   Problem: Fluid Volume: Goal: Ability to maintain a balanced intake and output will improve Outcome: Progressing   Problem: Health Behavior/Discharge Planning: Goal: Ability to identify and utilize available resources and services will improve Outcome:  Progressing Goal: Ability to manage health-related needs will improve Outcome: Progressing   Problem: Metabolic: Goal: Ability to maintain appropriate glucose levels will improve Outcome: Progressing   Problem: Nutritional: Goal: Maintenance of adequate nutrition will improve Outcome: Progressing Goal: Progress toward achieving an optimal weight will improve Outcome: Progressing   Problem: Skin Integrity: Goal: Risk for impaired skin integrity will decrease Outcome: Progressing   Problem: Tissue Perfusion: Goal: Adequacy of tissue perfusion will improve Outcome: Progressing

## 2023-11-09 NOTE — H&P (Signed)
 Physical Medicine and Rehabilitation Admission H&P       HPI: Travis [name] is a 83 year old right-handed male with history of kidney stones, hyperlipidemia, hypertension, CAD presence of permanent cardiac pacemaker/SSS followed by cardiology services Dr.Croitoru maintained on chronic Eliquis , type 2 diabetes mellitus, ventricular tachycardia as well as recent right total hip arthroplasty 10/31/2023 per Dr. Donnice Car.  Presented to [facility]  11/04/2023 with hypotension with systolic in the 80s.  In the ED was noted to have a creatinine of 2.5 whereas baseline is usually around 1.2 WBC 11,000 hemoglobin 7.8 and recent discharge after hip replacement of 9.5.  Chest x-ray showed evidence of atelectasis.  CT of the chest showed nonspecific airspace disease with atelectasis left lung base.  Patchy left basilar opacities concerning for possible infection.  Hospital stay he did receive 1 unit packed red blood cells for transfusion.  He was placed on IV fluids for gentle hydration.  Therapy evaluations completed patient weightbearing as tolerated right lower extremity and was admitted for a comprehensive rehab program.   Pt reports pain not bad at rest 3-4/10, however gets to 8-10/10 with therapy- however wasn't taking pain meds BEFORE therapy.   LBM 1-2 days ago- feels a little constipated, but doesn't want miralax daily- just prn- will ask when needs it.   Admits not eating a lot.    Review of Systems  Constitutional:  Negative for chills and fever.  HENT:  Negative for hearing loss.   Eyes:  Negative for blurred vision.  Respiratory:  Negative for cough, shortness of breath and wheezing.   Cardiovascular:  Positive for palpitations. Negative for leg swelling and PND.  Gastrointestinal:  Positive for constipation. Negative for heartburn, nausea and vomiting.  Genitourinary:  Positive for urgency. Negative for dysuria, flank pain and hematuria.   Musculoskeletal:  Positive for joint pain and myalgias.  Skin:  Negative for rash.  Neurological:  Positive for dizziness and weakness.  All other systems reviewed and are negative.      Past Medical History:  Diagnosis Date   Heart murmur      an innocent one (06/10/2016)   History of blood transfusion ~ 1956    while I was in hospital; don't remember why   History of kidney stones     Hyperlipidemia     Hypertension     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   SSS (sick sinus syndrome) (HCC) 06/10/2016   Type II diabetes mellitus (HCC)               Past Surgical History:  Procedure Laterality Date   APPENDECTOMY       CARDIAC CATHETERIZATION   06/10/2016   CARDIOVERSION N/A 10/01/2023    Procedure: CARDIOVERSION;  Surgeon: Francyne Headland, MD;  Location: [facility] INVASIVE CV LAB;  Service: Cardiovascular;  Laterality: N/A;   CATARACT EXTRACTION W/ INTRAOCULAR LENS IMPLANT Right     CYSTOSCOPY W/ STONE MANIPULATION       INSERT / REPLACE / REMOVE PACEMAKER   06/10/2016    dual chamber permanent pacemaker   KNEE ARTHROSCOPY W/ MENISCECTOMY Bilateral     LAPAROSCOPIC CHOLECYSTECTOMY       LEFT HEART CATH AND CORONARY ANGIOGRAPHY N/A 06/10/2016    Procedure: Left Heart Cath and Coronary Angiography;  Surgeon: Cherrie Toribio SAUNDERS, MD;  Location: [facility] INVASIVE CV LAB;  Service: Cardiovascular;  Laterality: N/A;   LITHOTRIPSY       PACEMAKER IMPLANT N/A 06/10/2016  Procedure: Pacemaker Implant;  Surgeon: Francyne Headland, MD;  Location: [facility] INVASIVE CV LAB;  Service: Cardiovascular;  Laterality: N/A;   TONSILLECTOMY       TOTAL HIP ARTHROPLASTY Right 10/31/2023    Procedure: RIGHT TOTAL HIP ARTHROPLASTY,  ANTERIOR APPROACH;  Surgeon: Ernie Cough, MD;  Location: [facility];  Service: Orthopedics;  Laterality: Right;             Family History  Problem Relation Age of Onset   Colon cancer Father          Social History:  reports that he quit smoking about 42 years  ago. His smoking use included cigarettes. He started smoking about 67 years ago. He has a 12.5 pack-year smoking history. He has never used smokeless tobacco. He reports current alcohol use. He reports that he does not use drugs. Allergies:  Allergies  No Known Allergies    (Not in a hospital admission)             Home:   Functional History:   Functional Status:  Mobility:   ADL:   Cognition:   Physical Exam: There were no vitals taken for this visit. Physical Exam Gen: awake, alert, appropriate, sitting up in bed; NAD CV:  RRR- (ever since conversion 6 weeks ago)- no JVD Pulm: CTA B/L - no W/R/R GI: soft, NT, ND, (+)BS normoactive MSK: UEs 5-/5 throughout LLE 5-/5 throughout RLE- limited due to pain 2/5 in HF, KE, KF, but 5-/5 distally  Skin:    Comments: Hip incision with original surgical dressing on it- moderate edema, no surrounding erythema - skin tear on R upper arm above elbow and R wrist with large scab on it Neurological:     Comments: Patient is alert and oriented x 3 following commands. Intact to light touch in all 4 extremities        Lab Results Last 48 Hours  No results found for this or any previous visit (from the past 48 hours).   Imaging Results (Last 48 hours)  No results found.         There were no vitals taken for this visit.   Medical Problem List and Plan: 1. 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  Weightbearing as tolerated             -patient may  shower-cover surgical dressing and skin tears             -ELOS/Goals: 12-14 days- supervision  Admit to CIR- is appropriate for CIR 2.  Antithrombotics: -DVT/anticoagulation:  Pharmaceutical: Eliquis              -antiplatelet therapy: N/A 3. Pain Management: Robaxin 500 mg 3 times daily, hydrocodone 5-325 mg 1 tablet every 6 hours as needed 4. Mood/Behavior/Sleep: Melatonin 3 mg  nightly             -antipsychotic agents: N/A 5. Neuropsych/cognition: This patient is capable of making decisions on his own behalf. 6. Skin/Wound Care: Routine skin checks 7. Fluids/Electrolytes/Nutrition: Routine in and outs with follow-up chemistries 8.  Acute blood loss anemia.  Transfused 1 unit packed red blood cells follow-up CBC 9.  Hypotension.  Norvasc  5 mg daily.  HCTZ 12.5 mg daily and lisinopril  20 mg daily currently on hold and resume as needed.  Monitor with increased mobility 10.  Hyperlipidemia.  Lipitor/Zetia  11.  Constipation.  MiraLAX daily prn per pt request, Senokot-S 1 tablet twice daily  12.  Diabetes mellitus.  SSI.  Hemoglobin A1c 6.2 13.  CAD/permanent pacemaker/ventricular tachycardia- Afib s/p conversion 6 weeks ago .  Continue Eliquis .  Follow-up cardiology services Dr.Crotoru 14.  AKI on CKD3A.  Cr got to  2.5- down to 1.3 Received gentle IV fluids.  Follow-up chemistries 15. Glaucoma- con't drops   Toribio JINNY Pitch, PA-C 11/09/2023   I have personally performed a face to face diagnostic evaluation of this patient and formulated the key components of the plan.  Additionally, I have personally reviewed laboratory data, imaging studies, as well as relevant notes and concur with the physician assistant's documentation above.   The patient's status has not changed from the original H&P.  Any changes in documentation from the acute care chart have been noted above.

## 2023-11-09 NOTE — PMR Pre-admission (Signed)
 PMR Admission Coordinator Pre-Admission Assessment   Patient: Travis [name] is an 83 y.o., male MRN: [id number] DOB: Nov 14, 1940 Height:   Weight:     Insurance Information HMO:     PPO:      PCP:      IPA:      80/20: yes     OTHER:  PRIMARY: Medicare a and b      Policy#:  [id number]    Subscriber: pt Benefits:  Phone #: passport one source     Name:  Eff. Date: A 04/12/2004 and B 01/13/2012  Deduct: $1676      Out of Pocket Max: none      Life Max: none CIR: 100%      SNF: 20 full days Outpatient: 80%     Co-Pay: 20% Home Health: 100%      Co-Pay:  DME: 80%     Co-Pay: 20% Providers: pt choice SECONDARY: Generic Commercial (Supplement)      Policy#:  [id number]          Phone#:    Financial Counselor:       Phone#:    The "Data Collection Information Summary" for patients in Inpatient Rehabilitation Facilities with attached "Privacy Act Statement-Health Care Records" was provided and verbally reviewed with: Patient   Emergency Contact Information Contact Information       Name Relation Home Work Mobile    Mckeel,Cynthia Daughter [phone]   [phone]    Kerr,Shannon Daughter [phone]   [phone]         Other Contacts   None on File        Current Medical History  Patient Admitting Diagnosis: Hip fracture s/p THA, AKI, Anemia  History of Present Illness: Travis [name] is a 83 y.o. male with past medical history  of sick sinus syndrome status post pacemaker/paroxysmal A-fib on Eliquis , CAD, CKD stage IIIa, diabetes mellitus type 2, glaucoma who presented to [facility] on 10/30/23 after sustaining a fall towards the end of his 10K race. Pt.  fell on his right side and hurt his right hip and right elbow with multiple bruises and lacerations.   Patient found to have a right femoral neck fracture with mild displacement.Orthopedics was consulted and s/p right total hip arthroplasty 10/19  Postoperatively pt  weightbearing as tolerated. He was discharged home  with assistance from his daughter. At home, Pt. Had increasing weakness and presented to [facility] on 11/05/23. He was admitted for dehydration and failure to thrive with AKI (Cr 2.5 on admission, dropped to 1.30 11/08/23) and hypotension. He was also noted to have worsening anemia likely related to the original blood loss from his surgery.  He was given 1 unit of PRBCs at [facility] (HgB 7.8 on admission, rose to 8.5 1/27). Pt. Was given IV fluids and AKI improved. Chest x ray also showed evidence of atelectasis.    Patient's medical record [facility]  has been reviewed by the rehabilitation admission coordinator and physician. Past Medical History  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   SSS (sick sinus syndrome) (HCC) 06/10/2016   Type II diabetes mellitus (HCC)     Has  the patient had major surgery during 100 days prior to admission? Yes  Family History   family history includes Colon cancer in his father.  Current Medications  Current Outpatient Medications:    acetaminophen  (TYLENOL ) 500 MG tablet, Take 1-2 tablets (500-1,000 mg total) by mouth every 8 (eight) hours as needed for mild pain (pain score 1-3)., Disp: , Rfl:    amLODipine  (NORVASC ) 5 MG tablet, Take 1 tablet (5 mg total) by mouth daily., Disp: 90 tablet, Rfl: 3   apixaban  (ELIQUIS ) 5 MG TABS tablet, Take 1 tablet (5 mg total) by mouth 2 (two) times daily., Disp: 60 tablet, Rfl: 6   atorvastatin  (LIPITOR) 40 MG tablet, Take 1 tablet (40 mg total) by mouth daily., Disp: 90 tablet, Rfl: 3   Blood Glucose Monitoring Suppl (ONETOUCH VERIO FLEX SYSTEM) w/Device KIT, Use to test blood sugars 2-3x a week, Disp: , Rfl:    brimonidine -timolol   (COMBIGAN ) 0.2-0.5 % ophthalmic solution, Place 1 drop into both eyes 2 (two) times daily., Disp: 15 mL, Rfl: 5   Cholecalciferol (VITAMIN D3) 2000 units capsule, Take 2,000 Units by mouth daily., Disp: , Rfl:    ezetimibe  (ZETIA ) 10 MG tablet, Take 1 tablet (10 mg total) by mouth daily., Disp: 90 tablet, Rfl: 2   glucose blood (ONETOUCH VERIO) test strip, Use to test blood sugars 2-3x a week E11.9, Disp: , Rfl:    [Paused] hydrochlorothiazide  (MICROZIDE ) 12.5 MG capsule, Take 1 capsule (12.5 mg total) by mouth daily., Disp: 90 capsule, Rfl: 3   [Paused] lisinopril  (ZESTRIL ) 20 MG tablet, Take 1 tablet (20 mg total) by mouth daily., Disp: 90 tablet, Rfl: 3   metFORMIN  (GLUCOPHAGE ) 500 MG tablet, Take 1 tablet (500 mg total) by mouth 2 (two) times daily., Disp: 180 tablet, Rfl: 2   polyethylene glycol powder (GLYCOLAX/MIRALAX) 17 GM/SCOOP powder, Dissolve 1 capful (17g) in 4-8 ounces of liquid and take by mouth daily., Disp: 238 g, Rfl: 0   senna (SENOKOT) 8.6 MG TABS tablet, Take 1 tablet (8.6 mg total) by mouth at bedtime as needed for mild constipation or moderate constipation., Disp: 30 tablet, Rfl: 0   traMADol (ULTRAM) 50 MG tablet, Take 1 tablet (50 mg total) by mouth every 8 (eight) hours as needed for moderate pain (pain score 4-6) or severe pain (pain score 7-10)., Disp: 10 tablet, Rfl: 0  Patients Current Diet: Diet Regular, Thin  Precautions / Restrictions Precautions: Fall Weight Bearing Restrictions Per Provider Order: Yes RLE Weight Bearing Per Provider Order: WBAT    Has the patient had 2 or more falls or a fall with injury in the past year? Yes  Prior Activity Level Community (5-7x/wk): Pt. active in the community PTA   Prior Functional Level Self Care: Did the patient need help bathing, dressing, using the toilet or eating? Independent  Indoor Mobility: Did the patient need assistance with walking from room to room (with or without device)? Independent  Stairs: Did the  patient need assistance with internal or external stairs (with or without device)? Independent  Functional Cognition: Did the patient need help planning regular tasks such as shopping or remembering to take medications? Independent  Patient Information Are you of Hispanic, Latino/a,or Spanish origin?: A. No, not of Hispanic, Latino/a, or Spanish origin What is your race?: A. White Do you need or want an interpreter to communicate with a doctor or health care staff?: 0. No  Patient's Response To:  Health Literacy and Transportation Is the patient able to respond to  health literacy and transportation needs?: Yes Health Literacy - How often do you need to have someone help you when you read instructions, pamphlets, or other written material from your doctor or pharmacy?: Never In the past 12 months, has lack of transportation kept you from medical appointments or from getting medications?: No In the past 12 months, has lack of transportation kept you from meetings, work, or from getting things needed for daily living?: No  Home Assistive Devices / Equipment    Prior Device Use: Indicate devices/aids used by the patient prior to current illness, exacerbation or injury? None of the above   Prior Functional Level Current Functional Level  Bed Mobility  Independent Min assist   Transfers  Independent  Mod assist   Mobility - Walk/Wheelchair  Independent  Other   Upper Body Dressing  Independent  Min assist   Lower Body Dressing  Independent  Max assist   Grooming  Independent  Min assist   Eating/Drinking  Independent  Min assist   Toilet Transfer  Independent  Mod assist   Bladder Continence   Continent  Continent   Bowel Management  Continent  Continent   Stair Surveyor, Quantity  Other   Communication  independent  Independent   Memory  independent Independent     Special considerations/life events Special service needs nonr  Previous Home  Environment (from acute therapy documentation) Living Arrangements: Alone Available Help at Discharge: Family Type of Home: House Home Layout: Two level Home Access: Stairs to enter Entrance Stairs-Rails: None Entrance Stairs-Number of Steps: 1 Bathroom Shower/Tub: Health Visitor: Standard Home Care Services: No Additional Comments: Above information is for daughter's home where he will be d/c initially.   Discharge Living Setting Plans for Discharge Living Setting: Patient's home Type of Home at Discharge: House Discharge Home Layout: Two level; Able to live on main level with bedroom/bathroom Discharge Home Access: Stairs to enter Entrance Stairs-Rails: None Entrance Stairs-Number of Steps: 3 Discharge Bathroom Shower/Tub: Walk-in shower Discharge Bathroom Toilet: Standard Discharge Bathroom Accessibility: Yes How Accessible: Accessible via walker; Accessible via wheelchair Does the patient have any problems obtaining your medications?: No   Social/Family/Support Systems Patient Roles: Other (Comment) Contact Information: [phone] Anticipated Caregiver: [name] (daughter) Anticipated Caregiver's Contact Information: Daughter may hire care or have other family assist when she has to leave. She does not work Ability/Limitations of Caregiver: Min A Caregiver Availability: 24/7 Discharge Plan Discussed with Primary Caregiver: Yes Is Caregiver In Agreement with Plan?: Yes Does Caregiver/Family have Issues with Lodging/Transportation while Pt is in Rehab?: No   Goals Patient/Family Goal for Rehab: PT/OT Supervision Expected length of stay: 12-14 days Pt/Family Agrees to Admission and willing to participate: Yes Program Orientation Provided & Reviewed with Pt/Caregiver Including Roles  & Responsibilities: Yes   Decrease burden of Care through IP rehab admission: Not anticipated  Possible need for SNF placement upon discharge: Not anticipated  Patient  Condition: I have reviewed medical records from [facility], spoken with CM, and patient and daughter. I met with patient at the bedside for inpatient rehabilitation assessment.  Patient will benefit from ongoing PT and OT, can actively participate in 3 hours of therapy a day 5 days of the week, and can make measurable gains during the admission.  Patient will also benefit from the coordinated team approach during an Inpatient Acute Rehabilitation admission.  The patient will receive intensive therapy as well as Rehabilitation physician, nursing, social worker, and care management interventions.  Due to safety, skin/wound care, disease management, medication administration, pain management, and patient education the patient requires 24 hour a day rehabilitation nursing.  The patient is currently mod A with mobility and basic ADLs.  Discharge setting and therapy post discharge at home with home health is anticipated.  Patient has agreed to participate in the Acute Inpatient Rehabilitation Program and will admit today.  Preadmission Screen Completed By:  Leita KATHEE Kleine, 11/09/2023 8:05 AM ______________________________________________________________________   Discussed status with Dr. Lovorn on 11/09/23 at 950 and received approval for admission today.  Admission Coordinator:  Leita KATHEE Kleine RAEJEAN, time 952 Date 11/09/23   Assessment/Plan: Diagnosis: R femoral neck fx s/p R THA Does the need for close, 24 hr/day Medical supervision in concert with the patient's rehab needs make it unreasonable for this patient to be served in a less intensive setting? Yes Co-Morbidities requiring supervision/potential complications: AKI, anemia s/p 1 unit pRBCs, failure ot thrive, Sick sinus syndrome, Afib on Eliquis ; DM2, CAD, CKD3a, glaucoma Due to bladder management, bowel management, safety, skin/wound care, disease management, medication administration, pain management, and patient  education, does the patient require 24 hr/day rehab nursing? Yes Does the patient require coordinated care of a physician, rehab nurse, PT, OT, and SLP to address physical and functional deficits in the context of the above medical diagnosis(es)? Yes Addressing deficits in the following areas: balance, endurance, locomotion, strength, transferring, bowel/bladder control, bathing, dressing, feeding, grooming, and toileting Can the patient actively participate in an intensive therapy program of at least 3 hrs of therapy 5 days a week? Yes The potential for patient to make measurable gains while on inpatient rehab is good Anticipated functional outcomes upon discharge from inpatient rehab: supervision PT, supervision OT, n/a SLP Estimated rehab length of stay to reach the above functional goals is: 12-14 days Anticipated discharge destination: Home 10. Overall Rehab/Functional Prognosis: good  MD Signature

## 2023-11-09 NOTE — Progress Notes (Signed)
 Inpatient Rehabilitation Admission Medication Review by a Pharmacist  A complete drug regimen review was completed for this patient to identify any potential clinically significant medication issues.  High Risk Drug Classes Is patient taking? Indication by Medication  Antipsychotic No   Anticoagulant Yes apixaban  - afib  Antibiotic No   Opioid Yes hydrocodone/apap - PRN pain  Antiplatelet No   Hypoglycemics/insulin Yes insulin aspart - DM  Vasoactive Medication Yes Amlodipine  - HTN  Chemotherapy No   Other Yes atorvastatin , ezetimibe  - HLD Brimonidine  gtt - glaucoma Vitamin D - supplement melatonin - sleep methocarbamol - muscle relaxant MiraLAX, Senokot-S - constipation     Type of Medication Issue Identified Description of Issue Recommendation(s)  Drug Interaction(s) (clinically significant)     Duplicate Therapy     Allergy     No Medication Administration End Date     Incorrect Dose     Additional Drug Therapy Needed     Significant med changes from prior encounter (inform family/care partners about these prior to discharge). metformin , tramadol held on CIR admission Communicate relevant medication changes to patient/family members at discharge from CIR.   Other       Clinically significant medication issues were identified that warrant physician communication and completion of prescribed/recommended actions by midnight of the next day:  No  Pharmacist comments: n/a   Time spent performing this drug regimen review (minutes): 20   Thank you for allowing pharmacy to be a part of this patient's care.  Shelba Collier, PharmD, BCPS Clinical Pharmacist

## 2023-11-10 ENCOUNTER — Ambulatory Visit: Attending: Physician Assistant | Admitting: Physician Assistant

## 2023-11-10 ENCOUNTER — Inpatient Hospital Stay (HOSPITAL_COMMUNITY)

## 2023-11-10 DIAGNOSIS — M150 Primary generalized (osteo)arthritis: Secondary | ICD-10-CM

## 2023-11-10 DIAGNOSIS — S72001A Fracture of unspecified part of neck of right femur, initial encounter for closed fracture: Secondary | ICD-10-CM

## 2023-11-10 LAB — CBC WITH DIFFERENTIAL/PLATELET
Abs Immature Granulocytes: 0.14 K/uL — ABNORMAL HIGH (ref 0.00–0.07)
Basophils Absolute: 0 K/uL (ref 0.0–0.1)
Basophils Relative: 0 %
Eosinophils Absolute: 0.2 K/uL (ref 0.0–0.5)
Eosinophils Relative: 1 %
HCT: 33.3 % — ABNORMAL LOW (ref 39.0–52.0)
Hemoglobin: 11.3 g/dL — ABNORMAL LOW (ref 13.0–17.0)
Immature Granulocytes: 1 %
Lymphocytes Relative: 19 %
Lymphs Abs: 2 K/uL (ref 0.7–4.0)
MCH: 32.8 pg (ref 26.0–34.0)
MCHC: 33.9 g/dL (ref 30.0–36.0)
MCV: 96.5 fL (ref 80.0–100.0)
Monocytes Absolute: 0.8 K/uL (ref 0.1–1.0)
Monocytes Relative: 8 %
Neutro Abs: 7.4 K/uL (ref 1.7–7.7)
Neutrophils Relative %: 71 %
Platelets: 466 K/uL — ABNORMAL HIGH (ref 150–400)
RBC: 3.45 MIL/uL — ABNORMAL LOW (ref 4.22–5.81)
RDW: 14.2 % (ref 11.5–15.5)
WBC: 10.6 K/uL — ABNORMAL HIGH (ref 4.0–10.5)
nRBC: 0 % (ref 0.0–0.2)

## 2023-11-10 LAB — GLUCOSE, CAPILLARY
Glucose-Capillary: 137 mg/dL — ABNORMAL HIGH (ref 70–99)
Glucose-Capillary: 171 mg/dL — ABNORMAL HIGH (ref 70–99)
Glucose-Capillary: 169 mg/dL — ABNORMAL HIGH (ref 70–99)
Glucose-Capillary: 133 mg/dL — ABNORMAL HIGH (ref 70–99)

## 2023-11-10 LAB — COMPREHENSIVE METABOLIC PANEL WITH GFR
ALT: 19 U/L (ref 0–44)
AST: 25 U/L (ref 15–41)
Albumin: 3.2 g/dL — ABNORMAL LOW (ref 3.5–5.0)
Alkaline Phosphatase: 62 U/L (ref 38–126)
Anion gap: 11 (ref 5–15)
BUN: 20 mg/dL (ref 8–23)
CO2: 24 mmol/L (ref 22–32)
Calcium: 10 mg/dL (ref 8.9–10.3)
Chloride: 102 mmol/L (ref 98–111)
Creatinine, Ser: 1.3 mg/dL — ABNORMAL HIGH (ref 0.61–1.24)
GFR, Estimated: 54 mL/min — ABNORMAL LOW (ref >60)
Glucose, Bld: 178 mg/dL — ABNORMAL HIGH (ref 70–99)
Potassium: 5.1 mmol/L (ref 3.5–5.1)
Sodium: 137 mmol/L (ref 135–145)
Total Bilirubin: 1.9 mg/dL — ABNORMAL HIGH (ref 0.0–1.2)
Total Protein: 6.3 g/dL — ABNORMAL LOW (ref 6.5–8.1)

## 2023-11-10 MED ORDER — ONDANSETRON 4 MG PO TBDP: 4.0000 mg | ORAL_TABLET | Freq: Three times a day (TID) | ORAL | Status: DC | PRN

## 2023-11-10 NOTE — Plan of Care (Signed)
  Problem: Consults Goal: RH GENERAL PATIENT EDUCATION Description: See Patient Education module for education specifics. Outcome: Progressing Goal: Nutrition Consult-if indicated Outcome: Progressing Goal: Diabetes Guidelines if Diabetic/Glucose > 140 Description: If diabetic or lab glucose is > 140 mg/dl - Initiate Diabetes/Hyperglycemia Guidelines & Document Interventions  Outcome: Progressing   Problem: RH BOWEL ELIMINATION Goal: RH STG MANAGE BOWEL WITH ASSISTANCE Description: STG Manage Bowel with mod I Assistance. Outcome: Progressing Goal: RH STG MANAGE BOWEL W/MEDICATION W/ASSISTANCE Description: STG Manage Bowel with Medication with mod I Assistance. Outcome: Progressing   Problem: RH SAFETY Goal: RH STG ADHERE TO SAFETY PRECAUTIONS W/ASSISTANCE/DEVICE Description: STG Adhere to Safety Precautions With cues Assistance/Device. Outcome: Progressing   Problem: RH PAIN MANAGEMENT Goal: RH STG PAIN MANAGED AT OR BELOW PT'S PAIN GOAL Description: Pain < 4 with prns Outcome: Progressing   Problem: RH KNOWLEDGE DEFICIT GENERAL Goal: RH STG INCREASE KNOWLEDGE OF SELF CARE AFTER HOSPITALIZATION Description: Patient and dtr will be able to manage care at discharge using educational resources for medications and dietary modification independently Outcome: Progressing   Problem: Education: Goal: Ability to describe self-care measures that may prevent or decrease complications (Diabetes Survival Skills Education) will improve Outcome: Progressing Goal: Individualized Educational Video(s) Outcome: Progressing   Problem: Coping: Goal: Ability to adjust to condition or change in health will improve Outcome: Progressing   Problem: Fluid Volume: Goal: Ability to maintain a balanced intake and output will improve Outcome: Progressing   Problem: Health Behavior/Discharge Planning: Goal: Ability to identify and utilize available resources and services will improve Outcome:  Progressing Goal: Ability to manage health-related needs will improve Outcome: Progressing   Problem: Metabolic: Goal: Ability to maintain appropriate glucose levels will improve Outcome: Progressing   Problem: Nutritional: Goal: Maintenance of adequate nutrition will improve Outcome: Progressing Goal: Progress toward achieving an optimal weight will improve Outcome: Progressing   Problem: Skin Integrity: Goal: Risk for impaired skin integrity will decrease Outcome: Progressing   Problem: Tissue Perfusion: Goal: Adequacy of tissue perfusion will improve Outcome: Progressing

## 2023-11-10 NOTE — Discharge Summary (Signed)
 Physician Discharge Summary  Patient ID: Travis [name] MRN: [id number] DOB/AGE: [birth year] 83 y.o.  Admit date: 11/09/2023 Discharge date: 11/21/2023  Discharge Diagnoses:  Principal Problem:   Fracture of femoral neck, right (HCC) Active Problems:   H/O total hip arthroplasty, right   Debility   Malnutrition of moderate degree   Cognitive changes Acute blood loss anemia Hypotension Hyperlipidemia Constipation Diabetes mellitus CAD/permanent pacemaker/ventricular tachycardia AKI on CKD stage III Glaucoma Left heel and B/L first toe DTI's Coccyx/buttock wound  Discharged Condition: Stable  Significant Diagnostic Studies: DG FEMUR, MIN 2 VIEWS RIGHT Result Date: 11/18/2023 EXAM: 2 VIEW(S) XRAY OF THE RIGHT FEMUR 11/18/2023 11:15:00 AM COMPARISON: None available. CLINICAL HISTORY: Hip pain FINDINGS: BONES AND JOINTS: Right total hip arthroplasty noted. Moderate enthesopathic changes of the inferior patella. SOFT TISSUES: Atherosclerotic vascular calcifications. IMPRESSION: 1. Right total hip arthroplasty. 2. Moderate enthesopathic changes of the inferior patella. Electronically signed by: Norleen Boxer MD 11/18/2023 12:24 PM EST RP Workstation: [id number]   VAS US  LOWER EXTREMITY VENOUS (DVT) Result Date: 11/11/2023  Lower Venous DVT Study Patient Name:  Travis [name]  Date of Exam:   11/10/2023 Medical Rec #: [id number]      Accession #:    [id number] Date of Birth: 01/09/1941      Patient Gender: M Patient Age:   83 years Exam Location:  [facility] Procedure:      VAS US  LOWER EXTREMITY VENOUS (DVT) Referring Phys: TORIBIO PITCH --------------------------------------------------------------------------------  Indications: Rehabilitation status post Total hip arthroplasty 10/31/23.  Limitations: Unable to rotate leg and poor ultrasound/tissue interface. Comparison Study: No prior study on file Performing Technologist: Alberta Lis RVS  Examination Guidelines: A complete  evaluation includes B-mode imaging, spectral Doppler, color Doppler, and power Doppler as needed of all accessible portions of each vessel. Bilateral testing is considered an integral part of a complete examination. Limited examinations for reoccurring indications may be performed as noted. The reflux portion of the exam is performed with the patient in reverse Trendelenburg.  +---------+---------------+---------+-----------+----------+-------------------+ RIGHT    CompressibilityPhasicitySpontaneityPropertiesThrombus Aging      +---------+---------------+---------+-----------+----------+-------------------+ CFV      Full           Yes      Yes                                      +---------+---------------+---------+-----------+----------+-------------------+ SFJ      Full                                                             +---------+---------------+---------+-----------+----------+-------------------+ FV Prox  Full                                                             +---------+---------------+---------+-----------+----------+-------------------+ FV Mid   Full                                                             +---------+---------------+---------+-----------+----------+-------------------+  FV DistalFull                                                             +---------+---------------+---------+-----------+----------+-------------------+ PFV      Full                                                             +---------+---------------+---------+-----------+----------+-------------------+ POP      Full           Yes      No                                       +---------+---------------+---------+-----------+----------+-------------------+ PTV                                                   patent by color     +---------+---------------+---------+-----------+----------+-------------------+ PERO                                                   Not well visualized +---------+---------------+---------+-----------+----------+-------------------+   +---------+---------------+---------+-----------+----------+-------------------+ LEFT     CompressibilityPhasicitySpontaneityPropertiesThrombus Aging      +---------+---------------+---------+-----------+----------+-------------------+ CFV      Full           Yes      Yes                                      +---------+---------------+---------+-----------+----------+-------------------+ SFJ      Full                                                             +---------+---------------+---------+-----------+----------+-------------------+ FV Prox  Full                                                             +---------+---------------+---------+-----------+----------+-------------------+ FV Mid   Full                                                             +---------+---------------+---------+-----------+----------+-------------------+ FV DistalFull                                                             +---------+---------------+---------+-----------+----------+-------------------+  PFV      Full                                                             +---------+---------------+---------+-----------+----------+-------------------+ POP                     Yes      Yes                                      +---------+---------------+---------+-----------+----------+-------------------+ PTV      Full                                                             +---------+---------------+---------+-----------+----------+-------------------+ PERO                                                  Not well visualized +---------+---------------+---------+-----------+----------+-------------------+     Summary: RIGHT: - There is no evidence of deep vein thrombosis in the lower extremity.  LEFT: - There is no  evidence of deep vein thrombosis in the lower extremity.  *See table(s) above for measurements and observations. Electronically signed by Debby Robertson on 11/11/2023 at 7:53:47 PM.    Final    DG Pelvis Portable Result Date: 10/31/2023 CLINICAL DATA:  Status post hip arthroplasty. EXAM: PORTABLE PELVIS 1-2 VIEWS COMPARISON:  Preoperative imaging. FINDINGS: Right hip arthroplasty in expected alignment. No periprosthetic lucency or fracture. Recent postsurgical change includes air and edema in the soft tissues. IMPRESSION: Right hip arthroplasty without immediate postoperative complication. Electronically Signed   By: Andrea Gasman M.D.   On: 10/31/2023 12:45   DG HIP UNILAT WITH PELVIS 1V RIGHT Result Date: 10/31/2023 CLINICAL DATA:  Elective surgery. EXAM: DG HIP (WITH OR WITHOUT PELVIS) 1V RIGHT COMPARISON:  Radiograph yesterday FINDINGS: Eight fluoroscopic spot views of the right hip submitted from the operating room. Sequential images during right hip arthroplasty. Fluoroscopy time 10.2 seconds. Dose 0.9033 mGy. IMPRESSION: Intraoperative fluoroscopy during right hip arthroplasty. Electronically Signed   By: Andrea Gasman M.D.   On: 10/31/2023 11:30   DG C-Arm 1-60 Min-No Report Result Date: 10/31/2023 Fluoroscopy was utilized by the requesting physician.  No radiographic interpretation.   DG C-Arm 1-60 Min-No Report Result Date: 10/31/2023 Fluoroscopy was utilized by the requesting physician.  No radiographic interpretation.   DG Humerus Right Result Date: 10/30/2023 CLINICAL DATA:  Abrasion right arm after tripping and falling. EXAM: RIGHT HUMERUS - 2+ VIEW COMPARISON:  None Available. FINDINGS: There is no evidence of fracture or other focal bone lesions. Soft tissues are unremarkable. IMPRESSION: Negative. Electronically Signed   By: Camellia Candle M.D.   On: 10/30/2023 13:29   DG HIP UNILAT WITH PELVIS 2-3 VIEWS RIGHT Result Date: 10/30/2023 CLINICAL DATA:  [name] today while  running a race. EXAM: DG HIP (WITH OR WITHOUT PELVIS) 2-3V RIGHT COMPARISON:  None Available. FINDINGS:  There is a mildly displaced femoral neck fracture. The bony pelvis is intact. The left hip is intact. IMPRESSION: Mildly displaced right femoral neck fracture. Electronically Signed   By: MYRTIS Stammer M.D.   On: 10/30/2023 12:15   CT Head Wo Contrast Result Date: 10/30/2023 CLINICAL DATA:  [name].  Hit head. EXAM: CT HEAD WITHOUT CONTRAST CT CERVICAL SPINE WITHOUT CONTRAST TECHNIQUE: Multidetector CT imaging of the head and cervical spine was performed following the standard protocol without intravenous contrast. Multiplanar CT image reconstructions of the cervical spine were also generated. RADIATION DOSE REDUCTION: This exam was performed according to the departmental dose-optimization program which includes automated exposure control, adjustment of the mA and/or kV according to patient size and/or use of iterative reconstruction technique. COMPARISON:  Prior head CT from 10/11/2022 FINDINGS: CT HEAD FINDINGS Brain: Stable age related cerebral atrophy, ventriculomegaly and periventricular white matter disease. No extra-axial fluid collections are identified. No CT findings for acute hemispheric infarction or intracranial hemorrhage. No mass lesions. The brainstem and cerebellum are normal. Vascular: Stable vascular calcifications. No aneurysm or hyperdense vessels. Skull: No skull fracture or bone lesions. Sinuses/Orbits: The paranasal sinuses and mastoid air cells are clear. Stable leftward deviation of the bony nasal septum. The globes are intact. Other: No scalp lesions or scalp hematoma. CT CERVICAL SPINE FINDINGS Alignment: Grossly normal. Mild degenerative anterior subluxation of C7 compared to T1. Skull base and vertebrae: No acute fracture. No primary bone lesion or focal pathologic process. Soft tissues and spinal canal: No prevertebral fluid or swelling. No visible canal hematoma. Disc levels: The  spinal canal is fairly generous. No large disc protrusions or canal stenosis. Mild uncinate spurring and facet disease but no significant foraminal stenosis. Upper chest: The lung apices are grossly clear. Other: No neck mass or adenopathy or hematoma. Extensive bilateral carotid artery calcifications. IMPRESSION: 1. Stable age related cerebral atrophy, ventriculomegaly and periventricular white matter disease. 2. No acute intracranial findings or skull fracture. 3. Normal alignment of the cervical vertebral bodies and no acute fracture. Electronically Signed   By: MYRTIS Stammer M.D.   On: 10/30/2023 12:14   CT Cervical Spine Wo Contrast Result Date: 10/30/2023 CLINICAL DATA:  [name].  Hit head. EXAM: CT HEAD WITHOUT CONTRAST CT CERVICAL SPINE WITHOUT CONTRAST TECHNIQUE: Multidetector CT imaging of the head and cervical spine was performed following the standard protocol without intravenous contrast. Multiplanar CT image reconstructions of the cervical spine were also generated. RADIATION DOSE REDUCTION: This exam was performed according to the departmental dose-optimization program which includes automated exposure control, adjustment of the mA and/or kV according to patient size and/or use of iterative reconstruction technique. COMPARISON:  Prior head CT from 10/11/2022 FINDINGS: CT HEAD FINDINGS Brain: Stable age related cerebral atrophy, ventriculomegaly and periventricular white matter disease. No extra-axial fluid collections are identified. No CT findings for acute hemispheric infarction or intracranial hemorrhage. No mass lesions. The brainstem and cerebellum are normal. Vascular: Stable vascular calcifications. No aneurysm or hyperdense vessels. Skull: No skull fracture or bone lesions. Sinuses/Orbits: The paranasal sinuses and mastoid air cells are clear. Stable leftward deviation of the bony nasal septum. The globes are intact. Other: No scalp lesions or scalp hematoma. CT CERVICAL SPINE FINDINGS  Alignment: Grossly normal. Mild degenerative anterior subluxation of C7 compared to T1. Skull base and vertebrae: No acute fracture. No primary bone lesion or focal pathologic process. Soft tissues and spinal canal: No prevertebral fluid or swelling. No visible canal hematoma. Disc levels: The spinal canal is fairly generous.  No large disc protrusions or canal stenosis. Mild uncinate spurring and facet disease but no significant foraminal stenosis. Upper chest: The lung apices are grossly clear. Other: No neck mass or adenopathy or hematoma. Extensive bilateral carotid artery calcifications. IMPRESSION: 1. Stable age related cerebral atrophy, ventriculomegaly and periventricular white matter disease. 2. No acute intracranial findings or skull fracture. 3. Normal alignment of the cervical vertebral bodies and no acute fracture. Electronically Signed   By: MYRTIS Stammer M.D.   On: 10/30/2023 12:14   DG Chest Portable 1 View Result Date: 10/30/2023 CLINICAL DATA:  [name] during a 10 K race. EXAM: PORTABLE CHEST 1 VIEW COMPARISON:  06/11/2016 FINDINGS: The pacer wires are stable. The cardiac silhouette, mediastinal and hilar contours are normal. Stable mild tortuosity and calcification of the thoracic aorta. Moderate to large hiatal hernia again noted. The lungs are clear. No pulmonary contusion, pleural effusion or pneumothorax. The bony pelvis is intact.  No definite rib fractures. IMPRESSION: 1. No acute cardiopulmonary findings. 2. Moderate to large hiatal hernia. 3. No definite rib fractures. Electronically Signed   By: MYRTIS Stammer M.D.   On: 10/30/2023 12:05   CUP PACEART REMOTE DEVICE CHECK Result Date: 10/22/2023 PPM Scheduled remote reviewed. Normal device function.  Presenting rhythm:  AP-VS.  49 AHR detections, EGMs consistent with AF/AFL, longest episodes lasting > 99 hours. History of AF, on Eliquis  per Epic. Increased AF up to 100% from June through September on trends, routing to triage. Next  remote transmission per protocol. - CS, CVRS   Labs:  Basic Metabolic Panel: Recent Labs  Lab 11/15/23 0515 11/16/23 0450 11/18/23 0502  NA 135 134* 136  K 3.6 3.9 3.8  CL 103 105 106  CO2 21* 21* 22  GLUCOSE 128* 123* 114*  BUN 34* 33* 28*  CREATININE 1.19 1.23 1.17  CALCIUM  9.4 9.1 9.1    CBC: Recent Labs  Lab 11/15/23 0515 11/15/23 0829 11/16/23 0450 11/18/23 0502  WBC 12.5*  --   --  8.1  HGB 9.0* 9.8* 8.9* 8.9*  HCT 26.1* 29.7* 25.9* 25.9*  MCV 96.0  --   --  95.6  PLT 369  --   --  352    CBG: Recent Labs  Lab 11/17/23 2146 11/18/23 0546 11/18/23 1124 11/18/23 1711 11/18/23 2039  GLUCAP 226* 123* 156* 117* 275*    Brief HPI:   HALDON CARLEY is a 83 y.o. right-handed male with history significant for hyperlipidemia hypertension, CAD presence of permanent cardiac pacemaker/SSS followed by cardiology services Dr.Croitoru maintained on chronic Eliquis , diabetes mellitus, ventricular tachycardia as well as recent right total hip arthroplasty by Dr. Donnice Cheese 10/31/2023 after a fall.  Presented to [facility] hospital in [place]  11/04/2023 with hypotension with systolic in the 80s.  In the ED he was noted to have creatinine of 2.5 whereas baseline is usually around 1.2 WBC 11,000 hemoglobin 7.8 and recent discharge after hip replacement 9.5.  Chest x-ray showed evidence of atelectasis.  CT of the chest showed nonspecific airspace disease with atelectasis left lung base.  Hospital stay he did receive 1 unit of packed red blood cells for transfusion.  He is placed on IV fluids for gentle hydration.  Therapy evaluations completed weightbearing as tolerated right lower extremity and was admitted for a comprehensive rehab program.   Hospital Course: MUJTABA BOLLIG was admitted to rehab 11/09/2023 for inpatient therapies to consist of PT, ST and OT at least three hours five days a week. Past  admission physiatrist, therapy team and rehab RN have worked  together to provide customized collaborative inpatient rehab.  Pertaining to patient's right femoral hip fracture with postoperative debility orthostasis after right total hip arthroplasty 10/31/2023 weightbearing as tolerated.  Surgical site clean and dry follow-up orthopedic services.  Pain management with use of Robaxin as well as hydrocodone.  Acute blood loss anemia transfuse 1 unit of packed red blood cells follow-up CBC.  Blood pressure remains soft maintained on Norvasc  patient had been on HCTZ as well as lisinopril  prior to admission on hold and resumed as mobility and pressures normalized.  Lipitor and Zetia  ongoing for hyperlipidemia.  Blood sugars overall controlled hemoglobin A1c 6.2 and patient had been on Glucophage  prior to admission resumed as renal function stabilized.  Bouts of constipation resolved with laxative assistance.  History of CAD permanent pacemaker ventricular tachycardia A-fib status post convergent 6 weeks ago continue Eliquis  follow-up cardiology services no chest pain or shortness of breath.  AKI on CKD stage III creatinine 2.5 down to 1.3 received gentle IV fluids and monitor chemistries.  Skin care left heel and B/L first toes DTI's Prevalon boot when in bed.  Cleanse coccyx/buttocks wound with Vashe do not rinse and allow to air dry.  Apply 1/4 inch thick layer of santyl to wound bed, top with saline moist gauze, dry gauze and silicone foam.  May lift foam daily to replace santyl, change foam every 3 days and as needed as well as cleanse left heel and left great toe with soap and water, dry and apply Xeroform gauze every other day to purple discoloration.  Place left foot Prevalon boot to offload pressure.  Cleansing of right arm wound with Vashe apply Xeroform gauze to wound bed daily and secure with silicone foam or Curlex roll whichever preferred.  Soak dressing with normal saline of adhered to wound bed for atraumatic removal.   Blood pressures were monitored on TID basis  and remained soft and monitored  Diabetes has been monitored with ac/hs CBG checks and SSI was use prn for tighter BS control.    Rehab course: During patient's stay in rehab weekly team conferences were held to monitor patient's progress, set goals and discuss barriers to discharge. At admission, patient required min mod assist  He/She  has had improvement in activity tolerance, balance, postural control as well as ability to compensate for deficits. He/She has had improvement in functional use RUE/LUE  and RLE/LLE as well as improvement in awareness.  Patient with progressive gains working with energy conservation.  Ambulates with rolling walker supervision from room to main gym at 200 feet and comes to sitting in armchair for rest break.  Completed gait training/wheelchair 200 feet rollator with cues for brake management controlling speed.  During ADLs patient pleasant motivated throughout sessions.  Focused initially on AE training with reacher for simulated lower body dressing using gait belt as patient had already been dressed.  Performing with supervision after OT simulating several times.  Walking in room with rolling walker chair sink bathroom wall with supervision able to stand at sink and perform oral hygiene grooming supervision.  3/3 toileting task with supervision as well.  Ambulated with rollator with education on locking and unlocking brakes.  Full family teaching completed plan discharge to home     Media Information   Document Information  Photos  Coccyx  11/09/2023 16:00  Attached To:  Hospital Encounter on 11/09/23  Source Information  Nida, Geralynn ORN, RN  [facility]   Media Information   Document Information  Photos  Right upper posterior arm  11/09/2023 16:00  Attached To:  Hospital Encounter on 11/09/23  Source Information  Nida, Geralynn ORN, RN  [facility]   Media Information   Document Information  Photos  Left fingers  11/09/2023  16:11  Attached To:  Hospital Encounter on 11/09/23  Source Information  Nida, Geralynn ORN, RN  [facility]      Media Information   Document Information  Photos  Sacrum  11/11/2023 09:00  Attached To:  Hospital Encounter on 11/09/23  Source Information  Amedeo Gentry, RN  [facility]   Media Information   Document Information  Photos  Right arm  11/11/2023 10:14  Attached To:  Hospital Encounter on 11/09/23  Source Information  Amedeo Gentry, RN  [facility]       Disposition: 01 home with outpatient    Diet: Diabetic diet  Special Instructions: No driving smoking or alcohol  Weightbearing as tolerated right lower extremity  Cleanse coccyx/buttocks wound with Vashe, do not rinse and allow to air dry.  Apply 1/4 inch thick layer of Santyl to wound bed daily, top with saline moistened gauze, dry gauze and silicone foam.  May lift foam daily to replaceSantyl, change foam every 3 days and as needed soiling.  Cleanse right arm wound with Vashe, apply Xeroform gauze to wound bed daily and secure with silicone foam or Kerlix roll gauze whichever is preferred.  Soak dressing normal saline if adhered to wound bed for atraumatic removal  Cleanse left heel and left great toe with soap and water, dry and apply Xeroform gauze every other day to purple discoloration.  Secure with silicone foam.  Place left foot and Prevalon boot to offload pressures  Medications at discharge. 1.  Tylenol  as needed 2.  Norvasc  5 mg p.o. daily 3.  Eliquis  5 mg p.o. twice daily 4.  Lipitor 40 mg p.o. daily 5.  Combigan  0.2-0.5% 1 drop both eyes twice daily 6.  Vitamin D 1000 units p.o. daily 7.  Zetia  10 mg p.o. daily 8.  Hydrocodone 5-325 mg 1 tablet every 6 hours as needed pain 9.  Melatonin 3 mg p.o. nightly 10.  MiraLAX daily as needed 11.  Robaxin 500 mg p.o. 4times daily as needed 12.  Vitamin C 500 mg daily 13.  Glucophage  500 mg twice daily 14.   Robaxin 500 mg 3 times daily 15.  Protonix 40 mg twice daily 16.  Senokot S2 tablets twice daily hold for loose stools 17.  Zinc  sulfate 220 mg p.o. daily 18.  Voltaren gel 2 g 4 times daily to affected area 30-35 minutes were spent completing discharge summary and discharge planning  Discharge Instructions     Ambulatory referral to Occupational Therapy   Complete by: As directed    Eval and treat   Ambulatory referral to Physical Therapy   Complete by: As directed    Eval and treat        Follow-up Information     Emeline Search C, DO Follow up.   Specialty: Physical Medicine and Rehabilitation Why: No formal follow-up needed Contact information: [address] [place] [address] [phone]         Ernie Cough, MD Follow up.   Specialty: Orthopedic Surgery Why: Call for appointment Contact information: [address] [place] [address] [phone]  Tisovec, Charlie ORN, MD Follow up.   Specialty: Internal Medicine Why: Call for appointment Contact information: [address] [place] [address] [phone]                 Signed: Toribio PARAS Melayna Robarts 11/19/2023, 5:01 AM

## 2023-11-10 NOTE — Progress Notes (Signed)
 Inpatient Rehabilitation  Patient information reviewed and entered into eRehab system by [facility]. Karen Kays., CCC/SLP, PPS Coordinator.  Information including medical coding, functional ability and quality indicators will be reviewed and updated through discharge.

## 2023-11-10 NOTE — Progress Notes (Addendum)
 PROGRESS NOTE   Subjective/Complaints:  No events overnight. Vitals stable     11/10/2023    5:09 AM 11/09/2023    9:33 PM 11/09/2023    6:42 PM  Vitals with BMI  Height   5' 11  Weight   150 lbs  BMI   20.93  Systolic 118 135 870  Diastolic 71 64 57  Pulse 77 70 64    Recent Labs    11/09/23 1710 11/09/23 2141 11/10/23 0511  GLUCAP 145* 158* 137*     AM labs significant for downtrending BUN/creatinine, mildly elevated T. bili 1.9, downtrending leukocytosis 10.6.  Hemoglobin increased to 11.3, along with thrombocytosis 466.  LBM 3 days prior per patient.   ROS: Denies fevers, chills, N/V, abdominal pain, constipation, diarrhea, SOB, cough, chest pain, new weakness or paraesthesias.    Objective:   No results found. Recent Labs    11/10/23 0506  WBC 10.6*  HGB 11.3*  HCT 33.3*  PLT 466*   Recent Labs    11/10/23 0506  NA 137  K 5.1  CL 102  CO2 24  GLUCOSE 178*  BUN 20  CREATININE 1.30*  CALCIUM  10.0    Intake/Output Summary (Last 24 hours) at 11/10/2023 0810 Last data filed at 11/10/2023 0500 Gross per 24 hour  Intake 240 ml  Output 875 ml  Net -635 ml     Wound 11/09/23 1600 Pressure Injury Coccyx Mid Unstageable - Full thickness tissue loss in which the base of the injury is covered by slough (yellow, tan, gray, green or brown) and/or eschar (tan, brown or black) in the wound bed. (Active)     Wound 11/09/23 1600 Pressure Injury Heel Left Deep Tissue Pressure Injury - Purple or maroon localized area of discolored intact skin or blood-filled blister due to damage of underlying soft tissue from pressure and/or shear. (Active)     Wound 11/09/23 1600 Pressure Injury Toe (Comment  which one) Anterior;Right Deep Tissue Pressure Injury - Purple or maroon localized area of discolored intact skin or blood-filled blister due to damage of underlying soft tissue from pressure and/or shear (Active)     Physical Exam: Vital Signs Blood pressure 118/71, pulse 77, temperature 98.3 F (36.8 C), resp. rate 18, height 5' 11 (1.803 m), weight 68 kg, SpO2 100%. Constitutional: No apparent distress. Appropriate appearance for age.  HENT: No JVD. Neck Supple. Trachea midline. Atraumatic, normocephalic. Eyes: PERRLA. EOMI. Visual fields grossly intact.  Cardiovascular: RRR, no murmurs/rub/gallops. 1+ R hip Edema. Peripheral pulses 2+  Respiratory: CTAB. No rales, rhonchi, or wheezing. On RA.  Abdomen: + bowel sounds, normoactive. No distention or tenderness.  Skin: C/D/I. No apparent lesions.   + R hip Surgical site with minimal drainage. + R elbow skin tear - c/d/I.  + blancheable erythema bilateral 1st toes and heels  MSK:      No apparent deformity. + R hip ROM deficit d/t pain   Neurologic exam:  Cognition: AAO to person, place, time and event.   Language: Fluent, No substitutions or neoglisms. No dysarthria.  Memory: Recalls 3/3 objects at 5 minutes. No apparent deficits   Insight: Good  insight into current condition.  Mood: Pleasant affect, appropriate mood.  Sensation: Equal and intact in BL UE and Les.  Reflexes: 2+ in BL UE and LEs.  Strength: UEs 5-/5 throughout LLE 5-/5 throughout RLE- limited due to pain 2/5 in HF, KE, KF, but 5-/5 distally       Assessment/Plan: 1. Functional deficits which require 3+ hours per day of interdisciplinary therapy in a comprehensive inpatient rehab setting. Physiatrist is providing close team supervision and 24 hour management of active medical problems listed below. Physiatrist and rehab team continue to assess barriers to discharge/monitor patient progress toward functional and medical goals  Care Tool:  Bathing              Bathing assist       Upper Body Dressing/Undressing Upper body dressing        Upper body assist      Lower Body Dressing/Undressing Lower body dressing            Lower body assist        Toileting Toileting    Toileting assist       Transfers Chair/bed transfer  Transfers assist           Locomotion Ambulation   Ambulation assist              Walk 10 feet activity   Assist           Walk 50 feet activity   Assist           Walk 150 feet activity   Assist           Walk 10 feet on uneven surface  activity   Assist           Wheelchair     Assist               Wheelchair 50 feet with 2 turns activity    Assist            Wheelchair 150 feet activity     Assist          Blood pressure 118/71, pulse 77, temperature 98.3 F (36.8 C), resp. rate 18, height 5' 11 (1.803 m), weight 68 kg, SpO2 100%.  Medical Problem List and Plan: 1. 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  Weightbearing as tolerated             -patient may  shower-cover surgical dressing and skin tears             -ELOS/Goals: 12-14 days- supervision             - Stable to continue CIR  2.  Antithrombotics: -DVT/anticoagulation:  Pharmaceutical: Eliquis              -antiplatelet therapy: N/A 3. Pain Management: Robaxin 500 mg 3 times daily, hydrocodone 5-325 mg 1 tablet every 6 hours as needed  4. Mood/Behavior/Sleep: Melatonin 3 mg nightly             -antipsychotic agents: N/A 5. Neuropsych/cognition: This patient is capable of making decisions on his own behalf. 6. Skin/Wound Care: Routine skin checks   - 10/29: Bilateral heel and 1st toe DTIs; add prevalon boots when in bed  7. Fluids/Electrolytes/Nutrition: Routine in and outs with follow-up chemistries  - Admission labs stable  8.  Acute blood loss anemia.  Transfused 1 unit packed red blood cells follow-up CBC  - Hemoglobin improved to 11 on  admission labs; monitor  9.  Hypotension.  Norvasc  5 mg daily.  HCTZ 12.5 mg daily and lisinopril  20 mg daily  currently on hold and resume as needed.  Monitor with increased mobility     11/10/2023    5:09 AM 11/09/2023    9:33 PM 11/09/2023    6:42 PM  Vitals with BMI  Height   5' 11  Weight   150 lbs  BMI   20.93  Systolic 118 135 870  Diastolic 71 64 57  Pulse 77 70 64    10.  Hyperlipidemia.  Lipitor/Zetia  11.  Constipation.  MiraLAX daily prn per pt request, Senokot-S 1 tablet twice daily  - Monitor today, and may need sorbitol  12.  Diabetes mellitus.  SSI.  Hemoglobin A1c 6.2  - Blood sugars well-controlled, monitor Recent Labs    11/09/23 1710 11/09/23 2141 11/10/23 0511  GLUCAP 145* 158* 137*     13.  CAD/permanent pacemaker/ventricular tachycardia- Afib s/p conversion 6 weeks ago .  Continue Eliquis .  Follow-up cardiology services Dr.Crotoru 14.  AKI on CKD3A.  Cr got to  2.5- down to 1.3 Received gentle IV fluids.  Follow-up chemistries  - Admission labs with creatinine 1.3; trend  15. Glaucoma- con't drops 16.  Thrombocytosis.  Likely reactive, trend.  LOS: 1 days A FACE TO FACE EVALUATION WAS PERFORMED  Travis [name] 11/10/2023, 8:10 AM

## 2023-11-10 NOTE — Evaluation (Signed)
 Physical Therapy Assessment and Plan  Patient Details  Name: Travis [name] MRN: [id number] Date of Birth: [birth year]  PT Diagnosis: Abnormal posture, Difficulty walking, Muscle weakness, and Pain in R hip Rehab Potential: Good ELOS: 10-14 days   Today's Date: 11/10/2023 PT Individual Time: 1050-1202 PT Individual Time Calculation (min): 72 min    Hospital Problem: Principal Problem:   Fracture of femoral neck, right (HCC) Active Problems:   H/O total hip arthroplasty, right   Debility   Past Medical History:  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   SSS (sick sinus syndrome) (HCC) 06/10/2016   Type II diabetes mellitus (HCC)    Past Surgical History:  Past Surgical History:  Procedure Laterality Date   APPENDECTOMY     CARDIAC CATHETERIZATION  06/10/2016   CARDIOVERSION N/A 10/01/2023   Procedure: CARDIOVERSION;  Surgeon: Francyne Headland, MD;  Location: [facility];  Service: Cardiovascular;  Laterality: N/A;   CATARACT EXTRACTION W/ INTRAOCULAR LENS IMPLANT Right    CYSTOSCOPY W/ STONE MANIPULATION     INSERT / REPLACE / REMOVE PACEMAKER  06/10/2016   dual chamber permanent pacemaker   KNEE ARTHROSCOPY W/ MENISCECTOMY Bilateral    LAPAROSCOPIC CHOLECYSTECTOMY     LEFT HEART CATH AND CORONARY ANGIOGRAPHY N/A 06/10/2016   Procedure: Left Heart Cath and Coronary Angiography;  Surgeon: Cherrie Toribio SAUNDERS, MD;  Location: [facility];  Service: Cardiovascular;  Laterality: N/A;   LITHOTRIPSY     PACEMAKER IMPLANT N/A 06/10/2016   Procedure: Pacemaker Implant;  Surgeon: Francyne Headland, MD;  Location: [facility];  Service: Cardiovascular;  Laterality: N/A;   TONSILLECTOMY     TOTAL HIP ARTHROPLASTY Right  10/31/2023   Procedure: RIGHT TOTAL HIP ARTHROPLASTY,  ANTERIOR APPROACH;  Surgeon: Ernie Cough, MD;  Location: [facility] OR;  Service: Orthopedics;  Laterality: Right;    Assessment & Plan Clinical Impression: Patient is a 83 y.o. male with past medical history  of sick sinus syndrome status post pacemaker/paroxysmal A-fib on Eliquis , CAD, CKD stage IIIa, diabetes mellitus type 2, glaucoma who presented to [facility] on 10/30/23 after sustaining a fall towards the end of his 10K race. Pt.  fell on his right side and hurt his right hip and right elbow with multiple bruises and lacerations.   Patient found to have a right femoral neck fracture with mild displacement.Orthopedics was consulted and s/p right total hip arthroplasty 10/19  Postoperatively pt  weightbearing as tolerated. He was discharged home with assistance from his daughter. At home, Pt. Had increasing weakness and presented to [facility] on 11/05/23. He was admitted for dehydration and failure to thrive with AKI (Cr 2.5 on admission, dropped to 1.30 11/08/23) and hypotension. He was also noted to have worsening anemia likely related to the original blood loss from his surgery.  He was given 1 unit of PRBCs at [facility] (HgB 7.8 on admission, rose to 8.5 1/27). Pt. Was given IV fluids and AKI improved. Chest x ray also showed evidence of atelectasis.   Patient currently requires min with mobility secondary to muscle weakness and muscle joint tightness, decreased cardiorespiratoy endurance, and decreased standing balance, decreased postural control, and decreased balance  strategies.  Prior to hospitalization, patient was independent  with mobility and lived with Alone in a House home.  Home access is 1 STE from garageStairs to enter.  Patient will benefit from skilled PT intervention to maximize safe functional mobility, minimize fall risk, and decrease caregiver burden for planned discharge home with intermittent assist.   Anticipate patient will benefit from follow up OP at discharge.  PT - End of Session Activity Tolerance: Tolerates 30+ min activity with multiple rests Endurance Deficit: Yes Endurance Deficit Description: rest breaks with mobility tasks secondary to nausea and decreased upright tolerance PT Assessment Rehab Potential (ACUTE/IP ONLY): Good PT Barriers to Discharge: Inaccessible home environment;Decreased caregiver support;Home environment access/layout PT Barriers to Discharge Comments: lives alone, plan to DC to daughter's house for one week, 1 STE PT Patient demonstrates impairments in the following area(s): Balance;Behavior;Endurance;Motor;Nutrition;Pain;Perception;Safety;Sensory;Skin Integrity PT Transfers Functional Problem(s): Bed Mobility;Car;Bed to Chair;Furniture PT Locomotion Functional Problem(s): Ambulation;Stairs PT Plan PT Intensity: Minimum of 1-2 x/day ,45 to 90 minutes PT Frequency: 5 out of 7 days PT Duration Estimated Length of Stay: 10-14 days PT Treatment/Interventions: Ambulation/gait training;Balance/vestibular training;Discharge planning;Functional mobility training;Psychosocial support;Therapeutic Activities;Skin care/wound management;Neuromuscular re-education;Therapeutic Exercise;DME/adaptive equipment instruction;Pain management;Splinting/orthotics;UE/LE Strength taining/ROM;Community reintegration;Patient/family education;Stair training;UE/LE Coordination activities PT Transfers Anticipated Outcome(s): modI PT Locomotion Anticipated Outcome(s): supervision ambulatory PT Recommendation Recommendations for Other Services: None Follow Up Recommendations: Outpatient PT Patient destination: Home Equipment Recommended: To be determined   PT Evaluation Precautions/Restrictions Precautions Precautions: Fall Recall of Precautions/Restrictions: Impaired Precaution/Restrictions Comments: Unable to recall anterior approach precautions. Restrictions Weight Bearing  Restrictions Per Provider Order: Yes RLE Weight Bearing Per Provider Order: Weight bearing as tolerated Pain Interference Pain Interference Pain Effect on Sleep: 1. Rarely or not at all Pain Interference with Therapy Activities: 1. Rarely or not at all Pain Interference with Day-to-Day Activities: 1. Rarely or not at all Home Living/Prior Functioning Home Living Living Arrangements: Alone Available Help at Discharge: Family;Available 24 hours/day Type of Home: House Home Access: Stairs to enter [employer] of Steps: 1 STE from garage Entrance Stairs-Rails: None Home Layout: Two level;Able to live on main level with bedroom/bathroom;Full bath on main level Alternate Level Stairs-Number of Steps: stays on main level Bathroom Shower/Tub: Walk-in shower;Door Bathroom Toilet: Handicapped height Additional Comments: Above information is for daughter's home where he will be d/c initially.  Lives With: Alone Prior Function Level of Independence: Independent with basic ADLs;Independent with homemaking with ambulation;Independent with gait;Independent with transfers  Able to Take Stairs?: Yes Driving: Yes Vocation: Retired Optometrist - History Ability to See in Adequate Light: 0 Adequate Perception Perception: Within Functional Limits Praxis Praxis: WFL  Cognition Overall Cognitive Status: Within Functional Limits for tasks assessed Arousal/Alertness: Awake/alert Orientation Level: Oriented X4 Memory: Appears intact Awareness: Appears intact Problem Solving: Appears intact Safety/Judgment: Appears intact Sensation Sensation Light Touch: Appears Intact Hot/Cold: Not tested Proprioception: Not tested Stereognosis: Not tested Coordination Gross Motor Movements are Fluid and Coordinated: No Fine Motor Movements are Fluid and Coordinated: Yes Coordination and Movement Description: Deficits due to post-op weakness and pain. Motor  Motor Motor: Other  (comment) Motor - Skilled Clinical Observations: Deficits due to post-op weakness and pain.  Trunk/Postural Assessment  Cervical Assessment Cervical Assessment: Exceptions to [facility] (forward head) Thoracic Assessment Thoracic Assessment: Exceptions to [facility] (rounded shoulders, thoracic kyphosis) Lumbar Assessment Lumbar Assessment: Exceptions to [facility] (posterior pelvic tilt) Postural Control Postural Control: Deficits on evaluation Righting Reactions: delayed Protective Responses: decreased Postural Limitations: decreased  Balance Balance Balance Assessed: Yes Standardized Balance  Assessment Standardized Balance Assessment: Timed Up and Go Test Timed Up and Go Test TUG: Normal TUG Normal TUG (seconds): 45.27 Static Sitting Balance Static Sitting - Balance Support: Feet supported;No upper extremity supported Static Sitting - Level of Assistance: 5: Stand by assistance Dynamic Sitting Balance Dynamic Sitting - Balance Support: Feet supported;No upper extremity supported Static Standing Balance Static Standing - Balance Support: Bilateral upper extremity supported;During functional activity Static Standing - Level of Assistance: Other (comment) (cga) Dynamic Standing Balance Dynamic Standing - Balance Support: Bilateral upper extremity supported;During functional activity Dynamic Standing - Level of Assistance: Other (comment) (CGA) Extremity Assessment  RUE Assessment RUE Assessment: Within Functional Limits LUE Assessment LUE Assessment: Within Functional Limits RLE Assessment RLE Assessment: Exceptions to [facility] General Strength Comments: grossly 4/5, limited slightly by pain LLE Assessment LLE Assessment: Within Functional Limits General Strength Comments: 5/5  Care Tool Care Tool Bed Mobility Roll left and right activity   Roll left and right assist level: Minimal Assistance - Patient > 75%    Sit to lying activity   Sit to lying assist level: Minimal Assistance - Patient >  75%    Lying to sitting on side of bed activity   Lying to sitting on side of bed assist level: the ability to move from lying on the back to sitting on the side of the bed with no back support.: Minimal Assistance - Patient > 75%     Care Tool Transfers Sit to stand transfer   Sit to stand assist level: Minimal Assistance - Patient > 75%    Chair/bed transfer   Chair/bed transfer assist level: Minimal Assistance - Patient > 75%    Car transfer   Car transfer assist level: Minimal Assistance - Patient > 75% (simulated with bed/chair transfer)      Care Tool Locomotion Ambulation   Assist level: Contact Guard/Touching assist Assistive device: Walker-rolling Max distance: 100'  Walk 10 feet activity   Assist level: Contact Guard/Touching assist Assistive device: Walker-rolling   Walk 50 feet with 2 turns activity   Assist level: Contact Guard/Touching assist Assistive device: Walker-rolling  Walk 150 feet activity Walk 150 feet activity did not occur: Safety/medical concerns (nausea with 100' walk)      Walk 10 feet on uneven surfaces activity Walk 10 feet on uneven surfaces activity did not occur: Safety/medical concerns      Stairs   Assist level: Contact Guard/Touching assist Stairs assistive device: Walker Max number of stairs: 1  Walk up/down 1 step activity   Walk up/down 1 step (curb) assist level: Contact Guard/Touching assist Walk up/down 1 step or curb assistive device: Walker  Walk up/down 4 steps activity Walk up/down 4 steps activity did not occur: Safety/medical concerns      Walk up/down 12 steps activity Walk up/down 12 steps activity did not occur: Safety/medical concerns      Pick up small objects from floor Pick up small object from the floor (from standing position) activity did not occur: Safety/medical concerns      Wheelchair Is the patient using a wheelchair?: Yes Type of Wheelchair: Manual   Wheelchair assist level: Dependent - Patient 0%     Wheel 50 feet with 2 turns activity   Assist Level: Dependent - Patient 0%  Wheel 150 feet activity   Assist Level: Dependent - Patient 0%    Refer to Care Plan for Long Term Goals  SHORT TERM GOAL WEEK 1 PT Short Term Goal 1 (Week 1): Pt will complete  bed mobility with supervision PT Short Term Goal 2 (Week 1): Pt will complete transfers wtih CGA PT Short Term Goal 3 (Week 1): Pt will complete gait 150' with LRAD CGA  Recommendations for other services: None   Skilled Therapeutic Intervention Evaluation completed (see details above and below) with education on PT POC and goals and individual treatment initiated with focus on gait training and therapeutic activities for transfer training, upright tolerance and education on nausea management. Pt completes transfers with min assist throughout session to RW. Pt ambulates with RW 100', 50', and 2x30' with pt reporting nausea after first gait trial requiring increased time to rest, pt provided with emesis back, ginger ale with pt reporting decreased nausea with increased rest time. Pt provided with education to encourage increased P.O. intake as well as eating prior to taking medications to decrease nausea with pt verbalizing understanding. Pt demonstrating excessive trunk flexion, BLE knee flexion, and inability to achieve hip extension to neutral secondary to muscular tightness, pt demonstrating inconsistent step length while ambulating demonstrating short shuffling steps initially but improves with repetition. Pt completes TUG in 45.27 seconds, indicating increased risk for falls. Pt completes up/down 4 curb step with RW with skilled cues for technique, requires CGA for task. Pt returns to room and remains seated in [facility] with all needs within reach, cal light in place at end of session. RN notified of pt position and about nausea during session.    Mobility Bed Mobility Bed Mobility: Sit to Supine;Supine to Sit Supine to Sit: Minimal  Assistance - Patient > 75% Sit to Supine: Minimal Assistance - Patient > 75% Transfers Transfers: Stand Pivot Transfers;Stand to Sit;Sit to Stand Sit to Stand: Minimal Assistance - Patient > 75% Stand to Sit: Minimal Assistance - Patient > 75% Stand Pivot Transfers: Minimal Assistance - Patient > 75% Stand Pivot Transfer Details: Verbal cues for safe use of DME/AE;Verbal cues for technique Transfer (Assistive device): Rolling walker Locomotion  Gait Ambulation: Yes Gait Assistance: Contact Guard/Touching assist Gait Distance (Feet): 100 Feet Assistive device: Rolling walker Gait Gait: Yes Gait Pattern: Impaired Gait Pattern: Step-to pattern;Trunk flexed;Poor foot clearance - right;Decreased step length - right Gait velocity: Decreased Stairs / Additional Locomotion Stairs: No Curb: [employer] Wheelchair Mobility: No   Discharge Criteria: Patient will be discharged from PT if patient refuses treatment 3 consecutive times without medical reason, if treatment goals not met, if there is a change in medical status, if patient makes no progress towards goals or if patient is discharged from hospital.  The above assessment, treatment plan, treatment alternatives and goals were discussed and mutually agreed upon: by patient  Reche Ohara PT, DPT 11/10/2023, 12:22 PM

## 2023-11-10 NOTE — Plan of Care (Signed)
  Problem: RH Balance Goal: LTG Patient will maintain dynamic standing with ADLs (OT) Description: LTG:  Patient will maintain dynamic standing balance with assist during activities of daily living (OT)  Flowsheets (Taken 11/10/2023 1322) LTG: Pt will maintain dynamic standing balance during ADLs with: Independent with assistive device   Problem: Sit to Stand Goal: LTG:  Patient will perform sit to stand in prep for activites of daily living with assistance level (OT) Description: LTG:  Patient will perform sit to stand in prep for activites of daily living with assistance level (OT) Flowsheets (Taken 11/10/2023 1322) LTG: PT will perform sit to stand in prep for activites of daily living with assistance level: Independent with assistive device   Problem: RH Bathing Goal: LTG Patient will bathe all body parts with assist levels (OT) Description: LTG: Patient will bathe all body parts with assist levels (OT) Flowsheets (Taken 11/10/2023 1322) LTG: Pt will perform bathing with assistance level/cueing: Independent with assistive device    Problem: RH Dressing Goal: LTG Patient will perform lower body dressing w/assist (OT) Description: LTG: Patient will perform lower body dressing with assist, with/without cues in positioning using equipment (OT) Flowsheets (Taken 11/10/2023 1322) LTG: Pt will perform lower body dressing with assistance level of: Independent with assistive device   Problem: RH Toileting Goal: LTG Patient will perform toileting task (3/3 steps) with assistance level (OT) Description: LTG: Patient will perform toileting task (3/3 steps) with assistance level (OT)  Flowsheets (Taken 11/10/2023 1322) LTG: Pt will perform toileting task (3/3 steps) with assistance level: Independent with assistive device   Problem: RH Toilet Transfers Goal: LTG Patient will perform toilet transfers w/assist (OT) Description: LTG: Patient will perform toilet transfers with assist, with/without  cues using equipment (OT) Flowsheets (Taken 11/10/2023 1322) LTG: Pt will perform toilet transfers with assistance level of: Independent with assistive device   Problem: RH Tub/Shower Transfers Goal: LTG Patient will perform tub/shower transfers w/assist (OT) Description: LTG: Patient will perform tub/shower transfers with assist, with/without cues using equipment (OT) Flowsheets (Taken 11/10/2023 1322) LTG: Pt will perform tub/shower stall transfers with assistance level of: Independent with assistive device

## 2023-11-10 NOTE — Progress Notes (Signed)
 Occupational Therapy Session Note  Patient Details  Name: Travis [name] MRN: [id number] Date of Birth: 1940/03/31  Today's Date: 11/10/2023 OT Individual Time: 1305-1402 OT Individual Time Calculation (min): 57 min    Short Term Goals: Week 1:  OT Short Term Goal 1 (Week 1): STGs=LTGs due to patient's estimated length of stay.  Skilled Therapeutic Interventions/Progress Updates:   Pt greeted sitting in [facility] for session focused on functional transfers, toileting, and standing balance/tolerance. Pt with reports of 4/10 pain at surgical site, rest provided as needed. Ambulatory toilet transfer with Min A for sit<>stand and CGA for actual ambulation. Pt completes 3/3 toileting tasks with Min A (although reports increased pain) for standing balance and setup of washcloth for care. Extended time provided for void. Standing handwashing with Min A for standing balance with no UE support. Pt dependently transported to main therapy gym. Pt performs x1 sit<>stand using counter for standing balance, emphasis placed on terminal hip/knee extension and upright posture all as means of decreased fall risk during ADLs/functional transfers. Pt requires Min A for transfer, CGA thereafter for a total standing time of ~1 min. Back in patient room, patient returns to bed with assistance for BLE elevation. Pt remained resting in bed with all immediate need.   Therapy Documentation Precautions:  Precautions Precautions: Fall Recall of Precautions/Restrictions: Impaired Precaution/Restrictions Comments: Unable to recall anterior approach precautions. Restrictions Weight Bearing Restrictions Per Provider Order: Yes RLE Weight Bearing Per Provider Order: Weight bearing as tolerated   Therapy/Group: Individual Therapy  Nereida Habermann, OTR/L, MSOT  11/10/2023, 1:20 PM

## 2023-11-10 NOTE — Plan of Care (Signed)
  Problem: RH Balance Goal: LTG Patient will maintain dynamic standing balance (PT) Description: LTG:  Patient will maintain dynamic standing balance with assistance during mobility activities (PT) Flowsheets (Taken 11/10/2023 1224) LTG: Pt will maintain dynamic standing balance during mobility activities with:: Independent with assistive device    Problem: Sit to Stand Goal: LTG:  Patient will perform sit to stand with assistance level (PT) Description: LTG:  Patient will perform sit to stand with assistance level (PT) Flowsheets (Taken 11/10/2023 1224) LTG: PT will perform sit to stand in preparation for functional mobility with assistance level: Independent with assistive device   Problem: RH Bed Mobility Goal: LTG Patient will perform bed mobility with assist (PT) Description: LTG: Patient will perform bed mobility with assistance, with/without cues (PT). Flowsheets (Taken 11/10/2023 1224) LTG: Pt will perform bed mobility with assistance level of: Independent with assistive device    Problem: RH Bed to Chair Transfers Goal: LTG Patient will perform bed/chair transfers w/assist (PT) Description: LTG: Patient will perform bed to chair transfers with assistance (PT). Flowsheets (Taken 11/10/2023 1224) LTG: Pt will perform Bed to Chair Transfers with assistance level: Independent with assistive device    Problem: RH Car Transfers Goal: LTG Patient will perform car transfers with assist (PT) Description: LTG: Patient will perform car transfers with assistance (PT). Flowsheets (Taken 11/10/2023 1224) LTG: Pt will perform car transfers with assist:: Supervision/Verbal cueing   Problem: RH Ambulation Goal: LTG Patient will ambulate in controlled environment (PT) Description: LTG: Patient will ambulate in a controlled environment, # of feet with assistance (PT). Flowsheets (Taken 11/10/2023 1224) LTG: Pt will ambulate in controlled environ  assist needed:: Supervision/Verbal  cueing LTG: Ambulation distance in controlled environment: 150' Goal: LTG Patient will ambulate in home environment (PT) Description: LTG: Patient will ambulate in home environment, # of feet with assistance (PT). Flowsheets (Taken 11/10/2023 1224) LTG: Pt will ambulate in home environ  assist needed:: Supervision/Verbal cueing LTG: Ambulation distance in home environment: 50'   Problem: RH Stairs Goal: LTG Patient will ambulate up and down stairs w/assist (PT) Description: LTG: Patient will ambulate up and down # of stairs with assistance (PT) Flowsheets (Taken 11/10/2023 1224) LTG: Pt will ambulate up/down stairs assist needed:: Supervision/Verbal cueing LTG: Pt will  ambulate up and down number of stairs: 1 per home set up

## 2023-11-10 NOTE — Evaluation (Signed)
 Occupational Therapy Assessment and Plan  Patient Details  Name: Travis [name] MRN: [id number] Date of Birth: 1940-07-25  OT Diagnosis: abnormal posture, acute pain, muscle weakness (generalized), and decreased standing and activity tolerance Rehab Potential: Rehab Potential (ACUTE ONLY): Good ELOS: 7-10 days   Today's Date: 11/10/2023 OT Individual Time: 0905-1000 OT Individual Time Calculation (min): 55 min     Hospital Problem: Principal Problem:   Fracture of femoral neck, right (HCC) Active Problems:   H/O total hip arthroplasty, right   Debility   Past Medical History:  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   SSS (sick sinus syndrome) (HCC) 06/10/2016   Type II diabetes mellitus (HCC)    Past Surgical History:  Past Surgical History:  Procedure Laterality Date   APPENDECTOMY     CARDIAC CATHETERIZATION  06/10/2016   CARDIOVERSION N/A 10/01/2023   Procedure: CARDIOVERSION;  Surgeon: Francyne Headland, MD;  Location: [facility] INVASIVE CV LAB;  Service: Cardiovascular;  Laterality: N/A;   CATARACT EXTRACTION W/ INTRAOCULAR LENS IMPLANT Right    CYSTOSCOPY W/ STONE MANIPULATION     INSERT / REPLACE / REMOVE PACEMAKER  06/10/2016   dual chamber permanent pacemaker   KNEE ARTHROSCOPY W/ MENISCECTOMY Bilateral    LAPAROSCOPIC CHOLECYSTECTOMY     LEFT HEART CATH AND CORONARY ANGIOGRAPHY N/A 06/10/2016   Procedure: Left Heart Cath and Coronary Angiography;  Surgeon: Cherrie Toribio SAUNDERS, MD;  Location: [facility] INVASIVE CV LAB;  Service: Cardiovascular;  Laterality: N/A;   LITHOTRIPSY     PACEMAKER IMPLANT N/A 06/10/2016   Procedure: Pacemaker Implant;  Surgeon: Francyne Headland, MD;  Location: [facility] INVASIVE CV LAB;  Service: Cardiovascular;   Laterality: N/A;   TONSILLECTOMY     TOTAL HIP ARTHROPLASTY Right 10/31/2023   Procedure: RIGHT TOTAL HIP ARTHROPLASTY,  ANTERIOR APPROACH;  Surgeon: Ernie Cough, MD;  Location: [facility] OR;  Service: Orthopedics;  Laterality: Right;    Assessment & Plan Clinical Impression: Patient is a 83 year old right-handed male with history of kidney stones, hyperlipidemia, hypertension, CAD presence of permanent cardiac pacemaker/SSS followed by cardiology services Dr.Croitoru maintained on chronic Eliquis , type 2 diabetes mellitus, ventricular tachycardia as well as recent right total hip arthroplasty 10/31/2023 per Dr. Cough Ernie. Presented to [facility] [place]  11/04/2023 with hypotension with systolic in the 80s. In the ED was noted to have a creatinine of 2.5 whereas baseline is usually around 1.2 WBC 11,000 hemoglobin 7.8 and recent discharge after hip replacement of 9.5. Chest x-ray showed evidence of atelectasis. CT of the chest showed nonspecific airspace disease with atelectasis left lung base. Patchy left basilar opacities concerning for possible infection. Hospital stay he did receive 1 unit packed red blood cells for transfusion. He was placed on IV fluids for gentle hydration. Patient transferred to CIR on 11/09/2023 .    Patient currently requires mod with basic self-care skills secondary to muscle weakness and muscle joint tightness, decreased cardiorespiratoy endurance, and decreased standing balance and decreased balance strategies.  Prior to hospitalization, patient could complete BADLs independently.   Patient will benefit from skilled intervention to increase independence with basic self-care skills prior to discharge home with care partner.  Anticipate patient will require intermittent supervision and no  further OT follow recommended.  OT - End of Session Activity Tolerance: Tolerates 10 - 20 min activity with multiple rests Endurance Deficit: Yes Endurance Deficit  Description: rest breaks with mobility tasks secondary to nausea and decreased upright tolerance OT Assessment Rehab Potential (ACUTE ONLY): Good OT Barriers to Discharge: Wound Care;Weight bearing restrictions OT Patient demonstrates impairments in the following area(s): Balance;Endurance;Pain;Safety OT Basic ADL's Functional Problem(s): Bathing;Dressing;Toileting OT Advanced ADL's Functional Problem(s): Simple Meal Preparation OT Transfers Functional Problem(s): Toilet;Tub/Shower OT Additional Impairment(s): None OT Plan OT Intensity: Minimum of 1-2 x/day, 45 to 90 minutes OT Frequency: 5 out of 7 days OT Duration/Estimated Length of Stay: 7-10 days OT Treatment/Interventions: Balance/vestibular training;Community reintegration;Discharge planning;Disease mangement/prevention;DME/adaptive equipment instruction;Functional mobility training;Neuromuscular re-education;Pain management;Patient/family education;Psychosocial support;Self Care/advanced ADL retraining;Skin care/wound managment;Therapeutic Activities;Therapeutic Exercise;UE/LE Strength taining/ROM OT Basic Self-Care Anticipated Outcome(s): Mod I OT Toileting Anticipated Outcome(s): Mod I OT Bathroom Transfers Anticipated Outcome(s): Mod I OT Recommendation Recommendations for Other Services: None Patient destination: Home Follow Up Recommendations: None Equipment Recommended: To be determined   OT Evaluation Precautions/Restrictions  Precautions Precautions: Fall Recall of Precautions/Restrictions: Impaired Precaution/Restrictions Comments: Unable to recall anterior approach precautions. Restrictions Weight Bearing Restrictions Per Provider Order: Yes RLE Weight Bearing Per Provider Order: Weight bearing as tolerated General Chart Reviewed: Yes Family/Caregiver Present: No  Pain Pain Assessment Pain Scale: 0-10 Pain Score: 4  Pain Type: Acute pain Pain Location: Foot Pain Intervention(s): Medication (See  eMAR);Repositioned Home Living/Prior Functioning Home Living Family/patient expects to be discharged to:: Private residence Living Arrangements: Alone Available Help at Discharge: Family, Available 24 hours/day (Patient has two daughters, current plan is to move in with one of his daughters.) Type of Home: House Home Access: Stairs to enter Secretary/administrator of Steps: 1 STE from garage Home Layout: Two level, Able to live on main level with bedroom/bathroom, Full bath on main level Bathroom Shower/Tub: Psychologist, counselling, Door Engineer, Agricultural) Firefighter: Handicapped height  Lives With: Alone IADL History Current License: Yes Occupation: Retired Prior Function Level of Independence: Independent with basic ADLs, Independent with homemaking with ambulation, Independent with gait, Independent with transfers Driving: Yes Vocation: Retired Administrator, Sports Baseline Vision/History: 0 No visual deficits Ability to See in Adequate Light: 0 Adequate Patient Visual Report: No change from baseline Vision Assessment?: No apparent visual deficits Perception  Perception: Within Functional Limits Praxis Praxis: WFL Cognition Cognition Overall Cognitive Status: Within Functional Limits for tasks assessed Arousal/Alertness: Awake/alert Orientation Level: Place;Situation;Person Memory: Appears intact Awareness: Appears intact Problem Solving: Appears intact Safety/Judgment: Appears intact Brief Interview for Mental Status (BIMS) Repetition of Three Words (First Attempt): 3 Temporal Orientation: Year: Correct Temporal Orientation: Month: Accurate within 5 days Temporal Orientation: Day: Correct Recall: Sock: Yes, no cue required Recall: Blue: Yes, no cue required Recall: Bed: Yes, after cueing (a piece of furniture) BIMS Summary Score: 14 Sensation Sensation Light Touch: Appears Intact Coordination Gross Motor Movements are Fluid and Coordinated: No Fine Motor  Movements are Fluid and Coordinated: Yes Coordination and Movement Description: Deficits due to post-op weakness and pain. Motor  Motor Motor: Other (comment) Motor - Skilled Clinical Observations: Deficits due to post-op weakness and pain.  Trunk/Postural Assessment  Cervical Assessment Cervical Assessment: Exceptions to [facility] (forward head) Thoracic Assessment Thoracic Assessment: Exceptions to [facility] (rounded shoulders, thoracic kyphosis) Lumbar Assessment Lumbar Assessment: Exceptions to [facility] (posterior pelvic tilt) Postural Control Postural Control: Deficits on evaluation Righting Reactions: delayed Protective Responses: decreased Postural Limitations: decreased  Balance Balance Static Sitting Balance Static Sitting - Balance  Support: Feet supported;No upper extremity supported Static Sitting - Level of Assistance: 5: Stand by assistance (SUP) Dynamic Sitting Balance Dynamic Sitting - Balance Support: Feet supported;No upper extremity supported Dynamic Sitting - Level of Assistance: 5: Stand by assistance (SUP) Static Standing Balance Static Standing - Balance Support: Bilateral upper extremity supported;During functional activity Static Standing - Level of Assistance: 5: Stand by assistance (CGA) Dynamic Standing Balance Dynamic Standing - Balance Support: Bilateral upper extremity supported;During functional activity Dynamic Standing - Level of Assistance: 5: Stand by assistance;4: Min assist (CGA-Min A) Extremity/Trunk Assessment RUE Assessment RUE Assessment: Within Functional Limits LUE Assessment LUE Assessment: Within Functional Limits  Care Tool Care Tool Self Care Eating   Eating Assist Level: Set up assist    Oral Care    Oral Care Assist Level: Set up assist    Bathing   Body parts bathed by patient: Right arm;Left arm;Chest;Abdomen          Upper Body Dressing(including orthotics)   What is the patient wearing?: Pull over shirt   Assist Level: Set up  assist    Lower Body Dressing (excluding footwear)   What is the patient wearing?: Underwear/pull up;Pants Assist for lower body dressing: Maximal Assistance - Patient 25 - 49%    Putting on/Taking off footwear   What is the patient wearing?: Socks;Shoes Assist for footwear: Dependent - Patient 0%       Care Tool Toileting Toileting activity   Assist for toileting: Maximal Assistance - Patient 25 - 49%     Care Tool Bed Mobility Roll left and right activity        Sit to lying activity        Lying to sitting on side of bed activity         Care Tool Transfers Sit to stand transfer        Chair/bed transfer         Toilet transfer   Assist Level: Minimal Assistance - Patient > 75%     Care Tool Cognition  Expression of Ideas and Wants Expression of Ideas and Wants: 4. Without difficulty (complex and basic) - expresses complex messages without difficulty and with speech that is clear and easy to understand  Understanding Verbal and Non-Verbal Content Understanding Verbal and Non-Verbal Content: 4. Understands (complex and basic) - clear comprehension without cues or repetitions   Memory/Recall Ability Memory/Recall Ability : That he or she is in a hospital/hospital unit   Refer to Care Plan for Long Term Goals  SHORT TERM GOAL WEEK 1 OT Short Term Goal 1 (Week 1): STGs=LTGs due to patient's estimated length of stay.  Recommendations for other services: None    Skilled Therapeutic Intervention Session began with introduction to OT role, OT POC, and general orientation to rehab unit/schedule. Pt completes full-body sponge-bathing with levels of assistance noted below. Ambulating transfer into/out of bathroom with CGA + RW. Pt remained in care of NT with all other immediate needs met.   ADL ADL Eating: Set up Grooming: Setup Where Assessed-Grooming: Edge of bed Upper Body Bathing: Setup Where Assessed-Upper Body Bathing: Edge of bed Lower Body Bathing:  Moderate assistance Where Assessed-Lower Body Bathing: Edge of bed Upper Body Dressing: Setup Where Assessed-Upper Body Dressing: Edge of bed Lower Body Dressing: Maximal assistance Where Assessed-Lower Body Dressing: Edge of bed Toileting: Maximal assistance Where Assessed-Toileting: Toilet;Bedside Commode Toilet Transfer: Minimal assistance Toilet Transfer Method: Freight Forwarder Equipment: Animator Transfer: Not assessed Film/video Editor: Not  assessed Mobility  Bed Mobility Bed Mobility: Sit to Supine;Supine to Sit Supine to Sit: Minimal Assistance - Patient > 75% Sit to Supine: Minimal Assistance - Patient > 75% Transfers Sit to Stand: Minimal Assistance - Patient > 75% Stand to Sit: Minimal Assistance - Patient > 75%   Discharge Criteria: Patient will be discharged from OT if patient refuses treatment 3 consecutive times without medical reason, if treatment goals not met, if there is a change in medical status, if patient makes no progress towards goals or if patient is discharged from hospital.  The above assessment, treatment plan, treatment alternatives and goals were discussed and mutually agreed upon: by patient  Nereida Habermann, OTR/L, MSOT  11/10/2023, 9:58 AM

## 2023-11-10 NOTE — Progress Notes (Signed)
 VASCULAR LAB    Bilateral lower extremity venous duplex has been performed.  See CV proc for preliminary results.   Delmi Fulfer, RVT 11/10/2023, 3:40 PM

## 2023-11-11 DIAGNOSIS — S72001A Fracture of unspecified part of neck of right femur, initial encounter for closed fracture: Secondary | ICD-10-CM | POA: Diagnosis not present

## 2023-11-11 LAB — GLUCOSE, CAPILLARY
Glucose-Capillary: 144 mg/dL — ABNORMAL HIGH (ref 70–99)
Glucose-Capillary: 89 mg/dL (ref 70–99)
Glucose-Capillary: 177 mg/dL — ABNORMAL HIGH (ref 70–99)
Glucose-Capillary: 134 mg/dL — ABNORMAL HIGH (ref 70–99)

## 2023-11-11 MED ORDER — SENNOSIDES-DOCUSATE SODIUM 8.6-50 MG PO TABS
2.0000 | ORAL_TABLET | Freq: Two times a day (BID) | ORAL | Status: DC
  Administered 2023-11-11 – 2023-11-21 (×20): 2 via ORAL
  Filled 2023-11-11 (×20): qty 2

## 2023-11-11 NOTE — Progress Notes (Signed)
 Patient ID: Travis [name], male   DOB: January 20, 1940, 83 y.o.   MRN: [id number]  1039-trying to get his home setup so he can return home,and reorganize since his bedroom was upstairs. Has the option to stay at his daughter's home when he discharges as well but it is not complete yet. States not able to be with him 24/7 as she has other obligations but will support and wants him to be safe.Informed that there will be family edu prior to discharge to ensure they can handle his care at discharge.   Graeme Jude, MSW, LCSW Office: [phone] Cell: [phone] Fax: [phone]

## 2023-11-11 NOTE — Progress Notes (Addendum)
 Physical Therapy Session Note  Patient Details  Name: Travis [name] MRN: [id number] Date of Birth: 06/25/1940  Today's Date: 11/11/2023 PT Individual Time: 0903-0933 PT Individual Time Calculation (min): 30 min   Short Term Goals: Week 1:  PT Short Term Goal 1 (Week 1): Pt will complete bed mobility with supervision PT Short Term Goal 2 (Week 1): Pt will complete transfers wtih CGA PT Short Term Goal 3 (Week 1): Pt will complete gait 150' with LRAD CGA  Skilled Therapeutic Interventions/Progress Updates: Pt presented in bed sleeping but easily aroused and agreeable to therapy. Pt states pain 4/10, premedicated with no additional intervention requested. Session focused on supine therex in preparation for bed mobility in next session. Pt participated in therex as follows:  Ankle pumps x 20 QS with 3 sec hold x 15 Heel slides 2 x 10 SAQ with 2lb weight 2 x 10 AA SLR x 10 Pt provided with ice pack at end of session in bed with call bell within reach and needs met.      Therapy Documentation Precautions:  Precautions Precautions: Fall Recall of Precautions/Restrictions: Impaired Precaution/Restrictions Comments: Unable to recall anterior approach precautions. Restrictions Weight Bearing Restrictions Per Provider Order: Yes RLE Weight Bearing Per Provider Order: Weight bearing as tolerated General:   Vital Signs:      Therapy/Group: Individual Therapy  Vikash Nest 11/11/2023, 9:44 AM

## 2023-11-11 NOTE — Care Management (Signed)
 Inpatient Rehabilitation Center Individual Statement of Services  Patient Name:  Travis [name]  Date:  11/11/2023  Welcome to the Inpatient Rehabilitation Center.  Our goal is to provide you with an individualized program based on your diagnosis and situation, designed to meet your specific needs.  With this comprehensive rehabilitation program, you will be expected to participate in at least 3 hours of rehabilitation therapies Monday-Friday, with modified therapy programming on the weekends.  Your rehabilitation program will include the following services:  Physical Therapy (PT), Occupational Therapy (OT), 24 hour per day rehabilitation nursing, Therapeutic Recreaction (TR), Psychology, Neuropsychology, Care Coordinator, Rehabilitation Medicine, Nutrition Services, Pharmacy Services, and Other  Weekly team conferences will be held on Tuesday to discuss your progress.  Your Inpatient Rehabilitation Care Coordinator will talk with you frequently to get your input and to update you on team discussions.  Team conferences with you and your family in attendance may also be held.  Expected length of stay: 7-14 days    Overall anticipated outcome: Independent with Assistive Device  Depending on your progress and recovery, your program may change. Your Inpatient Rehabilitation Care Coordinator will coordinate services and will keep you informed of any changes. Your Inpatient Rehabilitation Care Coordinator's name and contact numbers are listed  below.  The following services may also be recommended but are not provided by the Inpatient Rehabilitation Center:  Driving Evaluations Home Health Rehabiltiation Services Outpatient Rehabilitation Services Vocational Rehabilitation   Arrangements will be made to provide these services after discharge if needed.  Arrangements include referral to agencies that provide these services.  Your insurance has been verified to be:  Medicare A/B  Your primary  doctor is:  Charlie Reas  Pertinent information will be shared with your doctor and your insurance company.  Inpatient Rehabilitation Care Coordinator:  Graeme Jude, KEN [phone] or ([phone]  Information discussed with and copy given to patient by: Graeme DELENA Jude, 11/11/2023, 9:51 AM

## 2023-11-11 NOTE — Plan of Care (Signed)
  Problem: Consults Goal: RH GENERAL PATIENT EDUCATION Description: See Patient Education module for education specifics. Outcome: Progressing Goal: Nutrition Consult-if indicated Outcome: Progressing Goal: Diabetes Guidelines if Diabetic/Glucose > 140 Description: If diabetic or lab glucose is > 140 mg/dl - Initiate Diabetes/Hyperglycemia Guidelines & Document Interventions  Outcome: Progressing   Problem: RH BOWEL ELIMINATION Goal: RH STG MANAGE BOWEL WITH ASSISTANCE Description: STG Manage Bowel with mod I Assistance. Outcome: Progressing Goal: RH STG MANAGE BOWEL W/MEDICATION W/ASSISTANCE Description: STG Manage Bowel with Medication with mod I Assistance. Outcome: Progressing   Problem: RH SAFETY Goal: RH STG ADHERE TO SAFETY PRECAUTIONS W/ASSISTANCE/DEVICE Description: STG Adhere to Safety Precautions With cues Assistance/Device. Outcome: Progressing   Problem: RH PAIN MANAGEMENT Goal: RH STG PAIN MANAGED AT OR BELOW PT'S PAIN GOAL Description: Pain < 4 with prns Outcome: Progressing   Problem: RH KNOWLEDGE DEFICIT GENERAL Goal: RH STG INCREASE KNOWLEDGE OF SELF CARE AFTER HOSPITALIZATION Description: Patient and dtr will be able to manage care at discharge using educational resources for medications and dietary modification independently Outcome: Progressing   Problem: Education: Goal: Ability to describe self-care measures that may prevent or decrease complications (Diabetes Survival Skills Education) will improve Outcome: Progressing Goal: Individualized Educational Video(s) Outcome: Progressing   Problem: Coping: Goal: Ability to adjust to condition or change in health will improve Outcome: Progressing   Problem: Fluid Volume: Goal: Ability to maintain a balanced intake and output will improve Outcome: Progressing   Problem: Health Behavior/Discharge Planning: Goal: Ability to identify and utilize available resources and services will improve Outcome:  Progressing Goal: Ability to manage health-related needs will improve Outcome: Progressing   Problem: Metabolic: Goal: Ability to maintain appropriate glucose levels will improve Outcome: Progressing   Problem: Nutritional: Goal: Maintenance of adequate nutrition will improve Outcome: Progressing Goal: Progress toward achieving an optimal weight will improve Outcome: Progressing   Problem: Skin Integrity: Goal: Risk for impaired skin integrity will decrease Outcome: Progressing   Problem: Tissue Perfusion: Goal: Adequacy of tissue perfusion will improve Outcome: Progressing

## 2023-11-11 NOTE — Progress Notes (Signed)
 Physical Therapy Session Note  Patient Details  Name: Travis [name] MRN: [id number] Date of Birth: Nov 11, 1940  Today's Date: 11/11/2023 PT Individual Time: 1433-1536 PT Individual Time Calculation (min): 63 min   Short Term Goals: Week 1:  PT Short Term Goal 1 (Week 1): Pt will complete bed mobility with supervision PT Short Term Goal 2 (Week 1): Pt will complete transfers wtih CGA PT Short Term Goal 3 (Week 1): Pt will complete gait 150' with LRAD CGA  Skilled Therapeutic Interventions/Progress Updates:    Pt presents in main gym agreeable to PT. Pt denies pain at this time, reporting very slight nausea, provided with gingerale during session which pt reports is helpful. Session focused on gait training and NMR for dynamic standing balance and BUE/BLE muscle fiber recruitment with and without UE support, and transfer training without UE support and bed mobility. Pt ambulates 125' and 230' with RW CGA/close supervision demonstrating improving step length with increased distance ambulated, decreased stance time on RLE, cues provided for step length. Pt completes NMR in //bars with and without UE support including: - gait no UE support 2x8' - step taps 4 step no UE support 2x20 alternating BLE - step ups 4 step UE support on //bars 2x10 BLE - standing rows, straight arm lat pull downs, band pull aparts, and BUE shoulder ER red band 2x10 each - x10 sit to stands CGA no UE support Pt returns to room and completes stand step transfer no UE support min assist back to bed with skilled cues for safety and sequencing. Pt completes bed mobility with supervision cues for sequencing, remains semi reclined in bed with pillows positioned for comfort and pressure relief, with all needs within reach, cal light in place and bed alarm activated at end of session.    Therapy Documentation Precautions:  Precautions Precautions: Fall Recall of Precautions/Restrictions: Impaired Precaution/Restrictions  Comments: Unable to recall anterior approach precautions. Restrictions Weight Bearing Restrictions Per Provider Order: Yes RLE Weight Bearing Per Provider Order: Weight bearing as tolerated     Therapy/Group: Individual Therapy  Reche Ohara PT, DPT 11/11/2023, 5:09 PM

## 2023-11-11 NOTE — Progress Notes (Signed)
 Occupational Therapy Session Note  Patient Details  Name: Travis [name] MRN: [id number] Date of Birth: Mar 07, 1940  Today's Date: 11/11/2023 OT Individual Time: 1105-1200 OT Individual Time Calculation (min): 55 min   Today's Date: 11/11/2023 OT Individual Time: 8664-8567 OT Individual Time Calculation (min): 57 min   Short Term Goals: Week 1:  OT Short Term Goal 1 (Week 1): STGs=LTGs due to patient's estimated length of stay.  Skilled Therapeutic Interventions/Progress Updates:   Session 1: Pt greeted sitting in [facility] for skilled OT session with focus on functional transfers, standing balance/tolerance, and activity tolerance.   Pain: Pt reported 3/10 surgical pain, pre-medicated. OT offering intermediate rest breaks and positioning suggestions throughout session to address pain/fatigue and maximize participation/safety in session.   Functional Transfers: Sit<>stands with CGA + RW.   Self Care Tasks: No needs this session.   Therapeutic Activities: Pt instructed in the following activities to target standing tolerance/balance and weight-bearing/shifting onto RLE, details below: Table-top activity in static stance with unilateral support on RW. Pt tolerates stance for >5 mins with x1 standing rest-break. Tacticle cuing required to promote terminal hip/knee extension. Activity upgraded to include functional reach outside BOS, multimodal cuing required, including visual demonstration, to achieve weight-shift onto RLE, even in staggard stance. Discussed guarding of RLE.   Therapeutic Exercise: Pt instructed into in 1x20 reps of calf raises and 2x8 reps of LAQ for BLE strengthening/endurance for carryover into ADLs/functional transfers.     Pt remained sitting in WC with 4Ps assessed and immediate needs met. Pt continues to be appropriate for skilled OT intervention to promote further functional independence in ADLs/IADLs.   Session 2:  Pt greeted sitting in [facility] for skilled OT session with  focus on toileting, functional mobility, and higher-level standing balance activities.   Pain: Pt reported 4/10 surgical pain, OT offering intermediate rest breaks and positioning suggestions throughout session to address pain/fatigue and maximize participation/safety in session.   Functional Transfers: Sit<>stands with CGA progressing to supervision. Ambulatory toilet transfer in similar fashion.   Self Care Tasks: 3/3 toileting tasks with CGA-Min A for standing balance, Min due to x1 lateral LOB  Therapeutic Activities: Pt instructed in the following activities to target standing tolerance/balance, weight-bearing/shifting onto RLE, and decrease fall-risk,  details below: 2x5 reps of sit<>stands from heightened EOM, LLE support on 2 step for improved weight-shifting onto RLE.  X4 reps of ~34ft of functional mobility following agility ladder as visual cue to increase stride length. CGA provided.   Pt remained sitting in WC with 4Ps assessed and immediate needs met. Pt continues to be appropriate for skilled OT intervention to promote further functional independence in ADLs/IADLs.   Therapy Documentation Precautions:  Precautions Precautions: Fall Recall of Precautions/Restrictions: Impaired Precaution/Restrictions Comments: Unable to recall anterior approach precautions. Restrictions Weight Bearing Restrictions Per Provider Order: Yes RLE Weight Bearing Per Provider Order: Weight bearing as tolerated   Therapy/Group: Individual Therapy  Nereida Habermann, OTR/L, MSOT  11/11/2023, 7:55 AM

## 2023-11-11 NOTE — Progress Notes (Signed)
 PROGRESS NOTE   Subjective/Complaints:  No events overnight.  Patient doing very well today.  Vital stable, did have a temp of 99.1 overnight but denies any fevers. Pain relatively well-controlled on her current regimen.  ROS: Denies fevers, chills, N/V, abdominal pain, constipation, diarrhea, SOB, cough, chest pain, new weakness or paraesthesias.    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  Limitations: Unable to rotate leg and poor ultrasound/tissue interface. Comparison Study: No prior study on file Performing Technologist: Alberta Lis RVS  Examination Guidelines: A complete evaluation includes B-mode imaging, spectral Doppler, color Doppler, and power Doppler as needed of all accessible portions of each vessel. Bilateral testing is considered an integral part of a complete examination. Limited examinations for reoccurring indications may be performed as noted. The reflux portion of the exam is performed with the patient in reverse Trendelenburg.  +---------+---------------+---------+-----------+----------+-------------------+ RIGHT    CompressibilityPhasicitySpontaneityPropertiesThrombus Aging      +---------+---------------+---------+-----------+----------+-------------------+ CFV      Full           Yes      Yes                                       +---------+---------------+---------+-----------+----------+-------------------+ SFJ      Full                                                             +---------+---------------+---------+-----------+----------+-------------------+ FV Prox  Full                                                             +---------+---------------+---------+-----------+----------+-------------------+ FV Mid   Full                                                             +---------+---------------+---------+-----------+----------+-------------------+ FV DistalFull                                                             +---------+---------------+---------+-----------+----------+-------------------+  PFV      Full                                                             +---------+---------------+---------+-----------+----------+-------------------+ POP      Full           Yes      No                                       +---------+---------------+---------+-----------+----------+-------------------+ PTV                                                   patent by color     +---------+---------------+---------+-----------+----------+-------------------+ PERO                                                  Not well visualized +---------+---------------+---------+-----------+----------+-------------------+   +---------+---------------+---------+-----------+----------+-------------------+ LEFT     CompressibilityPhasicitySpontaneityPropertiesThrombus Aging      +---------+---------------+---------+-----------+----------+-------------------+ CFV      Full           Yes      Yes                                      +---------+---------------+---------+-----------+----------+-------------------+ SFJ      Full                                                             +---------+---------------+---------+-----------+----------+-------------------+ FV  Prox  Full                                                             +---------+---------------+---------+-----------+----------+-------------------+ FV Mid   Full                                                             +---------+---------------+---------+-----------+----------+-------------------+ FV DistalFull                                                             +---------+---------------+---------+-----------+----------+-------------------+ PFV      Full                                                             +---------+---------------+---------+-----------+----------+-------------------+  POP                     Yes      Yes                                      +---------+---------------+---------+-----------+----------+-------------------+ PTV      Full                                                             +---------+---------------+---------+-----------+----------+-------------------+ PERO                                                  Not well visualized +---------+---------------+---------+-----------+----------+-------------------+    Summary: RIGHT: - There is no evidence of deep vein thrombosis in the lower extremity.  LEFT: - There is no evidence of deep vein thrombosis in the lower extremity.  *See table(s) above for measurements and observations.    Preliminary    Recent Labs    11/10/23 0506  WBC 10.6*  HGB 11.3*  HCT 33.3*  PLT 466*   Recent Labs    11/10/23 0506  NA 137  K 5.1  CL 102  CO2 24  GLUCOSE 178*  BUN 20  CREATININE 1.30*  CALCIUM  10.0    Intake/Output Summary (Last 24 hours) at 11/11/2023 0856 Last data filed at 11/11/2023 0807 Gross per 24 hour  Intake 600 ml  Output 600 ml  Net 0 ml     Wound 11/09/23 1600 Pressure Injury Coccyx Mid Unstageable - Full thickness tissue loss in which the base of the injury is covered by slough (yellow, tan, gray, green or brown) and/or eschar (tan,  brown or black) in the wound bed. (Active)     Wound 11/09/23 1600 Pressure Injury Heel Left Deep Tissue Pressure Injury - Purple or maroon localized area of discolored intact skin or blood-filled blister due to damage of underlying soft tissue from pressure and/or shear. (Active)     Wound 11/09/23 1600 Pressure Injury Toe (Comment  which one) Anterior;Right Deep Tissue Pressure Injury - Purple or maroon localized area of discolored intact skin or blood-filled blister due to damage of underlying soft tissue from pressure and/or shear (Active)    Physical Exam: Vital Signs Blood pressure 115/63, pulse 79, temperature 99.1 F (37.3 C), temperature source Oral, resp. rate 18, height 5' 11 (1.803 m), weight 68 kg, SpO2 100%. Constitutional: No apparent distress. Appropriate appearance for age.  HENT: No JVD. Neck Supple. Trachea midline. Atraumatic, normocephalic. Eyes: PERRLA. EOMI. Visual fields grossly intact.  Cardiovascular: RRR, no murmurs/rub/gallops. No further R hip Edema. Peripheral pulses 2+  Respiratory: CTAB. No rales, rhonchi, or wheezing. On RA.  Abdomen: + bowel sounds, normoactive. No distention or tenderness.  Skin: C/D/I. No apparent lesions.   + R hip Surgical site with minimal drainage--stable + R elbow skin tear - c/d/I.  + blancheable erythema bilateral 1st toes and heels--covered in mepilex  MSK:      No apparent deformity. + R hip ROM deficit d/t pain   Neurologic exam:  Cognition: AAO  to person, place, time and event.   Language: Fluent, No substitutions or neoglisms. No dysarthria.  Memory: Recalls 3/3 objects at 5 minutes. No apparent deficits   Insight: Good  insight into current condition.  Mood: Pleasant affect, appropriate mood.  Sensation: Equal and intact in BL UE and Les.  Reflexes: 2+ in BL UE and LEs.  Strength: UEs 5-/5 throughout LLE 5-/5 throughout RLE- limited due to pain 3/5 in HF, KE, KF, but 5-/5 distally       Assessment/Plan: 1.  Functional deficits which require 3+ hours per day of interdisciplinary therapy in a comprehensive inpatient rehab setting. Physiatrist is providing close team supervision and 24 hour management of active medical problems listed below. Physiatrist and rehab team continue to assess barriers to discharge/monitor patient progress toward functional and medical goals  Care Tool:  Bathing    Body parts bathed by patient: Right arm, Left arm, Chest, Abdomen         Bathing assist       Upper Body Dressing/Undressing Upper body dressing   What is the patient wearing?: Pull over shirt    Upper body assist Assist Level: Set up assist    Lower Body Dressing/Undressing Lower body dressing      What is the patient wearing?: Underwear/pull up, Pants     Lower body assist Assist for lower body dressing: Maximal Assistance - Patient 25 - 49%     Toileting Toileting    Toileting assist Assist for toileting: Maximal Assistance - Patient 25 - 49%     Transfers Chair/bed transfer  Transfers assist  Chair/bed transfer activity did not occur: Safety/medical concerns  Chair/bed transfer assist level: Minimal Assistance - Patient > 75%     Locomotion Ambulation   Ambulation assist      Assist level: Contact Guard/Touching assist Assistive device: Walker-rolling Max distance: 100'   Walk 10 feet activity   Assist     Assist level: Contact Guard/Touching assist Assistive device: Walker-rolling   Walk 50 feet activity   Assist    Assist level: Contact Guard/Touching assist Assistive device: Walker-rolling    Walk 150 feet activity   Assist Walk 150 feet activity did not occur: Safety/medical concerns (nausea with 100' walk)         Walk 10 feet on uneven surface  activity   Assist Walk 10 feet on uneven surfaces activity did not occur: Safety/medical concerns         Wheelchair     Assist Is the patient using a wheelchair?: Yes Type of  Wheelchair: Manual    Wheelchair assist level: Dependent - Patient 0%      Wheelchair 50 feet with 2 turns activity    Assist        Assist Level: Dependent - Patient 0%   Wheelchair 150 feet activity     Assist      Assist Level: Dependent - Patient 0%   Blood pressure 115/63, pulse 79, temperature 99.1 F (37.3 C), temperature source Oral, resp. rate 18, height 5' 11 (1.803 m), weight 68 kg, SpO2 100%.  Medical Problem List and Plan: 1. 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  Weightbearing as tolerated             -patient may  shower-cover surgical dressing and skin tears             -ELOS/Goals: 12-14 days- supervision             -  Stable to continue CIR  2.  Antithrombotics: -DVT/anticoagulation:  Pharmaceutical: Eliquis              -antiplatelet therapy: N/A 3. Pain Management: Robaxin 500 mg 3 times daily, hydrocodone 5-325 mg 1 tablet every 6 hours as needed  4. Mood/Behavior/Sleep: Melatonin 3 mg nightly             -antipsychotic agents: N/A 5. Neuropsych/cognition: This patient is capable of making decisions on his own behalf. 6. Skin/Wound Care: Routine skin checks   - 10/29: Bilateral heel and 1st toe DTIs; add prevalon boots when in bed  7. Fluids/Electrolytes/Nutrition: Routine in and outs with follow-up chemistries  - Admission labs stable  8.  Acute blood loss anemia.  Transfused 1 unit packed red blood cells follow-up CBC  - Hemoglobin improved to 11 on admission labs; monitor  9.  Hypotension.  Norvasc  5 mg daily.  HCTZ 12.5 mg daily and lisinopril  20 mg daily currently on hold and resume as needed.  Monitor with increased mobility  - stable    11/11/2023    5:11 AM 11/10/2023    8:15 PM 11/10/2023    5:09 AM  Vitals with BMI  Systolic 115 115 881  Diastolic 63 59 71  Pulse 79 86 77    10.  Hyperlipidemia.  Lipitor/Zetia  11.   Constipation.  MiraLAX daily prn per pt request, Senokot-S 1 tablet twice daily  - Monitor today, and may need sorbitol  - 10-30: Sincrease sennakot to 2 tabs BID  12.  Diabetes mellitus.  SSI.  Hemoglobin A1c 6.2  - Blood sugars well-controlled, monitor Recent Labs    11/10/23 1608 11/10/23 2209 11/11/23 0631  GLUCAP 169* 133* 144*     13.  CAD/permanent pacemaker/ventricular tachycardia- Afib s/p conversion 6 weeks ago .  Continue Eliquis .  Follow-up cardiology services Dr.Crotoru 14.  AKI on CKD3A.  Cr got to  2.5- down to 1.3 Received gentle IV fluids.  Follow-up chemistries  - Admission labs with creatinine 1.3; trend  15. Glaucoma- con't drops 16.  Thrombocytosis.  Likely reactive, trend.  LOS: 2 days A FACE TO FACE EVALUATION WAS PERFORMED  Joesph JAYSON Likes 11/11/2023, 8:56 AM

## 2023-11-11 NOTE — Progress Notes (Signed)
 Occupational Therapy Session Note  Patient Details  Name: ALBARAA SWINGLE MRN: [id number] Date of Birth: 1940/04/15  Today's Date: 11/11/2023 OT Individual Time: 0950-1020 OT Individual Time Calculation (min): 30 min    Short Term Goals: Week 1:  OT Short Term Goal 1 (Week 1): STGs=LTGs due to patient's estimated length of stay.  Skilled Therapeutic Interventions/Progress Updates:    1:1 Pt received sitting up in the bed. Pt able to get to EOB with supervision with extra time from sitting up in the bed position with bed rail. Pt ambulated to the bathroom to the toilet with BSC over the top with min guard. Pt performed toileting with contact guard for standing balance. Transitioned into the shower and bathed with overall min A for washing feet sit to stand ; using the grab bar for balance with standing while washing buttocks. Pt transferred stand pivot with contact guard to the w/c. Introduced use of reacher for threading underwear and pants with extra time. Will need continued practice. Socks and shoes donned with total A. Pt able to don shirt with setup. Pt left sitting at the sink to perform shaving and oral care.  Pt RN came to change bandage on right UE and buttocks after shower. Incision bandage covered in shower.   Therapy Documentation Precautions:  Precautions Precautions: Fall Recall of Precautions/Restrictions: Impaired Precaution/Restrictions Comments: Unable to recall anterior approach precautions. Restrictions Weight Bearing Restrictions Per Provider Order: Yes RLE Weight Bearing Per Provider Order: Weight bearing as tolerated   Reports some soreness around bottom pressure injury    Therapy/Group: Individual Therapy  Dionne, [place] [facility] 11/11/2023, 7:22 PM

## 2023-11-12 DIAGNOSIS — E440 Moderate protein-calorie malnutrition: Secondary | ICD-10-CM | POA: Insufficient documentation

## 2023-11-12 DIAGNOSIS — S72001A Fracture of unspecified part of neck of right femur, initial encounter for closed fracture: Secondary | ICD-10-CM | POA: Diagnosis not present

## 2023-11-12 LAB — GLUCOSE, CAPILLARY
Glucose-Capillary: 140 mg/dL — ABNORMAL HIGH (ref 70–99)
Glucose-Capillary: 146 mg/dL — ABNORMAL HIGH (ref 70–99)
Glucose-Capillary: 141 mg/dL — ABNORMAL HIGH (ref 70–99)
Glucose-Capillary: 148 mg/dL — ABNORMAL HIGH (ref 70–99)

## 2023-11-12 MED ORDER — COLLAGENASE 250 UNIT/GM EX OINT
TOPICAL_OINTMENT | Freq: Every day | CUTANEOUS | Status: DC
  Administered 2023-11-16: 1 via TOPICAL
  Filled 2023-11-12 (×2): qty 30

## 2023-11-12 MED ORDER — VITAMIN C 500 MG PO TABS
500.0000 mg | ORAL_TABLET | Freq: Every day | ORAL | Status: DC
  Administered 2023-11-12 – 2023-11-21 (×10): 500 mg via ORAL
  Filled 2023-11-12 (×10): qty 1

## 2023-11-12 MED ORDER — PROSOURCE PLUS PO LIQD
30.0000 mL | Freq: Two times a day (BID) | ORAL | Status: DC
  Administered 2023-11-12: 30 mL via ORAL
  Filled 2023-11-12: qty 30

## 2023-11-12 MED ORDER — ZINC SULFATE 220 (50 ZN) MG PO CAPS
220.0000 mg | ORAL_CAPSULE | Freq: Every day | ORAL | Status: DC
  Administered 2023-11-12 – 2023-11-21 (×10): 220 mg via ORAL
  Filled 2023-11-12 (×10): qty 1

## 2023-11-12 MED ORDER — JUVEN PO PACK
1.0000 | PACK | Freq: Two times a day (BID) | ORAL | Status: DC
  Administered 2023-11-12 – 2023-11-21 (×17): 1 via ORAL
  Filled 2023-11-12 (×15): qty 1

## 2023-11-12 MED ORDER — POLYETHYLENE GLYCOL 3350 17 G PO PACK
17.0000 g | PACK | Freq: Every day | ORAL | Status: DC
  Administered 2023-11-12 – 2023-11-21 (×7): 17 g via ORAL
  Filled 2023-11-12 (×10): qty 1

## 2023-11-12 NOTE — Care Plan (Addendum)
 Wound Plan  Wounds present:  Wound 11/09/23 1600 Pressure Injury Coccyx Mid Unstageable - Full thickness tissue loss in which the base of the injury is covered by slough (yellow, tan, gray, green or brown) and/or eschar (tan, brown or black) in the wound bed. (Active)     Wound 11/09/23 1600 Pressure Injury Heel Left Deep Tissue Pressure Injury - Purple or maroon localized area of discolored intact skin or blood-filled blister due to damage of underlying soft tissue from pressure and/or shear. (Active)     Wound 11/09/23 1600 Pressure Injury Toe (Comment  which one) Anterior;Right Deep Tissue Pressure Injury - Purple or maroon localized area of discolored intact skin or blood-filled blister due to damage of underlying soft tissue from pressure and/or shear (Active)   Interventions:  1.WOC consult 2.collagenase (SANTYL) ointment Topical, Daily feeding supplement (GLUCERNA SHAKE) (GLUCERNA SHAKE) 237 mL, Oral, 2 times daily between meals 3.Pressure redistribution chair pad if out of bed and has any type of wound or MASD on the sacrum/coccyx/buttocks, or if patient will be  elevating the feet while in the chair.  4.Prevalon boot to offload heels and toes Laterality: Bilateral Wound care 5.Routine, Daily, First occurrence on Fri 11/12/23 at 1000 1. Cleanse coccyx/buttocks wound with Vashe, do not rinse and allow to air dry. Apply 1/4 thick layer of Santyl to wound bed daily, top with saline moist gauze, dry gauze and silicone foam. May lift foam daily to replace Santyl, change foam q3 days and prn soiling. 2. Cleanse R arm wound with Vashe, apply Xeroform gauze to wound bed daily and secure with silicone foam or Kerlix roll gauze whichever is preferred. Soak dressing with NS if adhered to wound bed for atraumatic removal. 6.Wound care Routine, Every other day, First occurrence on Fri 11/12/23 at 0736, Until Specified Cleanse L heel and L great toe with soap and water, dry and apply Xeroform gauze  (Lawson [phone]) every other day to purple discoloration. Secure with silicone foam. Place L foot in 7.Prevalon boot to offload pressure. 8. Air matterss 9.3 - Step skin care regimen: Cleaner, Moisturizer, Skin Protectant routine daily 10. Turn patient every 2 hours while in bed or assist with pressure relief every 15 min while in chair  11. Diet heart healthy/carb modified diet eating 50-75% 12.(feeding supplement) PROSource Plus liquid 30 mL, Oral, 2 times daily between meals 13.ascorbic acid (VITAMIN C) tablet 500 mg Oral, Daily,  14.nutrition supplement (JUVEN) (JUVEN) powder packet 1 packet Oral, 2 times daily between meals 15.zinc  sulfate (50mg  elemental zinc ) capsule 220 mg Oral, Daily  Braden Score: 18 Sensory: 3  MoistuDiet heart healthy/carb modified re: 4  Activity: 2  Mobility: 3  Nutrition: 3  Friction: 3  Contributors: Dr. Joesph Emeline Powell Tita MSN, RN-BC, CWOCN  Dan Anguilli PA Eulalio [place] BSN, RN-C [facility]

## 2023-11-12 NOTE — IPOC Note (Signed)
 Overall Plan of Care [facility]) Patient Details Name: Travis [name] MRN: [id number] DOB: 07-May-1940  Admitting Diagnosis: Fracture of femoral neck, right [facility])  Hospital Problems: Principal Problem:   Fracture of femoral neck, right (HCC) Active Problems:   H/O total hip arthroplasty, right   Debility   Malnutrition of moderate degree     Functional Problem List: Nursing Bowel, Safety, Endurance, Medication Management, Pain, Nutrition  PT Balance, Behavior, Endurance, Motor, Nutrition, Pain, Perception, Safety, Sensory, Skin Integrity  OT Balance, Endurance, Pain, Safety  SLP    TR         Basic ADL's: OT Bathing, Dressing, Toileting     Advanced  ADL's: OT Simple Meal Preparation     Transfers: PT Bed Mobility, Car, Bed to Chair, Occupational Psychologist, Research Scientist (life Sciences): PT Ambulation, Stairs     Additional Impairments: OT None  SLP        TR      Anticipated Outcomes Item Anticipated Outcome  Self Feeding    Swallowing      Basic self-care  Mod I  Toileting  Mod I   Bathroom Transfers Mod I  Bowel/Bladder  manage bowel w mod I assist  Transfers  modI  Locomotion  supervision ambulatory  Communication     Cognition     Pain  Pain < 4 with prns  Safety/Judgment  manage safety w cues   Therapy Plan: PT Intensity: Minimum of 1-2 x/day ,45 to 90 minutes PT Frequency: 5 out of 7 days PT Duration Estimated Length of Stay: 10-14 days OT Intensity: Minimum of 1-2 x/day, 45 to 90 minutes OT Frequency: 5 out of 7 days OT Duration/Estimated Length of Stay: 7-10 days     Team Interventions: Nursing Interventions Patient/Family Education, Pain Management, Medication Management, Bowel Management, Disease Management/Prevention, Discharge Planning  PT interventions Ambulation/gait training, Balance/vestibular training, Discharge planning, Functional mobility training, Psychosocial support, Therapeutic Activities, Skin care/wound management,  Neuromuscular re-education, Therapeutic Exercise, DME/adaptive equipment instruction, Pain management, Splinting/orthotics, UE/LE Strength taining/ROM, Community reintegration, Equities Trader education, Museum/gallery curator, UE/LE Coordination activities  OT Interventions Warden/ranger, Firefighter, Discharge planning, Disease mangement/prevention, Fish Farm Manager, Functional mobility training, Neuromuscular re-education, Pain management, Patient/family education, Psychosocial support, Self Care/advanced ADL retraining, Skin care/wound managment, Therapeutic Activities, Therapeutic Exercise, UE/LE Strength taining/ROM  SLP Interventions    TR Interventions    SW/CM Interventions Discharge Planning, Psychosocial Support, Patient/Family Education   Barriers to Discharge MD  Medical stability, Home enviroment access/loayout, Lack of/limited family support, and Weight bearing restrictions  Nursing Decreased caregiver support home with daughter who will hire help when she is not available  PT Inaccessible home environment, Decreased caregiver support, Home environment access/layout lives alone, plan to DC to daughter's house for one week, 1 STE  OT Wound Care, Weight bearing restrictions    SLP      SW Decreased caregiver support, Lack of/limited family support     Team Discharge Planning: Destination: PT-Home ,OT- Home , SLP-  Projected Follow-up: PT-Outpatient PT, OT-  None, SLP-  Projected Equipment Needs: PT-To be determined, OT- To be determined, SLP-  Equipment Details: PT- , OT-  Patient/family involved in discharge planning: PT- Patient,  OT-Patient, SLP-   MD ELOS: 12-14 days Medical Rehab Prognosis:  Excellent Assessment: The patient has been admitted for CIR therapies with the diagnosis of R femoral neck fracture. The team will be addressing functional mobility, strength, stamina, balance, safety, adaptive techniques and equipment, self-care,  bowel and  bladder mgt, patient and caregiver education,  . Goals have been set at supervision. Anticipated discharge destination is home.       See Team Conference Notes for weekly updates to the plan of care

## 2023-11-12 NOTE — Progress Notes (Signed)
 PROGRESS NOTE   Subjective/Complaints:  No events overnight.  Pain well-controlled with current regimen.  Patient feeling well today. Blood sugars fairly well-controlled on current regimen. Vitals are stable  ROS: Denies fevers, chills, N/V, abdominal pain, constipation, diarrhea, SOB, cough, chest pain, new weakness or paraesthesias.   Right hip pain  Objective:   VAS US  LOWER EXTREMITY VENOUS (DVT) Result Date: 11/11/2023  Lower Venous DVT Study Patient Name:  Travis [name]  Date of Exam:   11/10/2023 Medical Rec #: [id number]      Accession #:    [id number] Date of Birth: Jul 02, 1940      Patient Gender: M Patient Age:   83 years Exam [facility]  [facility] Procedure:      VAS US  LOWER EXTREMITY VENOUS (DVT) Referring Phys: TORIBIO PITCH --------------------------------------------------------------------------------  Indications: Rehabilitation status post Total hip arthroplasty 10/31/23.  Limitations: Unable to rotate leg and poor ultrasound/tissue interface. Comparison Study: No prior study on file Performing Technologist: Alberta Lis RVS  Examination Guidelines: A complete evaluation includes B-mode imaging, spectral Doppler, color Doppler, and power Doppler as needed of all accessible portions of each vessel. Bilateral testing is considered an integral part of a complete examination. Limited examinations for reoccurring indications may be performed as noted. The reflux portion of the exam is performed with the patient in reverse Trendelenburg.  +---------+---------------+---------+-----------+----------+-------------------+ RIGHT    CompressibilityPhasicitySpontaneityPropertiesThrombus Aging      +---------+---------------+---------+-----------+----------+-------------------+ CFV      Full           Yes      Yes                                       +---------+---------------+---------+-----------+----------+-------------------+ SFJ      Full                                                             +---------+---------------+---------+-----------+----------+-------------------+ FV Prox  Full                                                             +---------+---------------+---------+-----------+----------+-------------------+ FV Mid   Full                                                             +---------+---------------+---------+-----------+----------+-------------------+ FV DistalFull                                                             +---------+---------------+---------+-----------+----------+-------------------+  PFV      Full                                                             +---------+---------------+---------+-----------+----------+-------------------+ POP      Full           Yes      No                                       +---------+---------------+---------+-----------+----------+-------------------+ PTV                                                   patent by color     +---------+---------------+---------+-----------+----------+-------------------+ PERO                                                  Not well visualized +---------+---------------+---------+-----------+----------+-------------------+   +---------+---------------+---------+-----------+----------+-------------------+ LEFT     CompressibilityPhasicitySpontaneityPropertiesThrombus Aging      +---------+---------------+---------+-----------+----------+-------------------+ CFV      Full           Yes      Yes                                      +---------+---------------+---------+-----------+----------+-------------------+ SFJ      Full                                                             +---------+---------------+---------+-----------+----------+-------------------+ FV  Prox  Full                                                             +---------+---------------+---------+-----------+----------+-------------------+ FV Mid   Full                                                             +---------+---------------+---------+-----------+----------+-------------------+ FV DistalFull                                                             +---------+---------------+---------+-----------+----------+-------------------+ PFV      Full                                                             +---------+---------------+---------+-----------+----------+-------------------+  POP                     Yes      Yes                                      +---------+---------------+---------+-----------+----------+-------------------+ PTV      Full                                                             +---------+---------------+---------+-----------+----------+-------------------+ PERO                                                  Not well visualized +---------+---------------+---------+-----------+----------+-------------------+     Summary: RIGHT: - There is no evidence of deep vein thrombosis in the lower extremity.  LEFT: - There is no evidence of deep vein thrombosis in the lower extremity.  *See table(s) above for measurements and observations. Electronically signed by Debby Robertson on 11/11/2023 at 7:53:47 PM.    Final    Recent Labs    11/10/23 0506  WBC 10.6*  HGB 11.3*  HCT 33.3*  PLT 466*   Recent Labs    11/10/23 0506  NA 137  K 5.1  CL 102  CO2 24  GLUCOSE 178*  BUN 20  CREATININE 1.30*  CALCIUM  10.0    Intake/Output Summary (Last 24 hours) at 11/12/2023 1248 Last data filed at 11/12/2023 0818 Gross per 24 hour  Intake 840 ml  Output 475 ml  Net 365 ml     Wound 11/09/23 1600 Pressure Injury Coccyx Mid Unstageable - Full thickness tissue loss in which the base of the injury is covered by  slough (yellow, tan, gray, green or brown) and/or eschar (tan, brown or black) in the wound bed. (Active)     Wound 11/09/23 1600 Pressure Injury Heel Left Deep Tissue Pressure Injury - Purple or maroon localized area of discolored intact skin or blood-filled blister due to damage of underlying soft tissue from pressure and/or shear. (Active)     Wound 11/09/23 1600 Pressure Injury Toe (Comment  which one) Anterior;Right Deep Tissue Pressure Injury - Purple or maroon localized area of discolored intact skin or blood-filled blister due to damage of underlying soft tissue from pressure and/or shear (Active)    Physical Exam: Vital Signs Blood pressure 129/61, pulse 89, temperature 98.2 F (36.8 C), resp. rate 18, height 5' 11 (1.803 m), weight 68 kg, SpO2 97%. Constitutional: No apparent distress. Appropriate appearance for age.  Sitting upright in bed. HENT: No JVD. Neck Supple. Trachea midline. Atraumatic, normocephalic. Eyes: PERRLA. EOMI. Visual fields grossly intact.  Cardiovascular: RRR, no murmurs/rub/gallops. No further R hip Edema. Peripheral pulses 2+  Respiratory: CTAB. No rales, rhonchi, or wheezing. On RA.  Abdomen: + bowel sounds, normoactive. No distention or tenderness.  Skin: C/D/I. No apparent lesions.   + R hip Surgical site with minimal drainage--stable + R elbow skin tear - c/d/I.  + blancheable erythema bilateral 1st toes and heels--covered in mepilex  MSK:      No apparent deformity. + R  hip ROM deficit d/t pain   Neurologic exam:  Cognition: AAO to person, place, time and event.   Language: Fluent, No substitutions or neoglisms. No dysarthria.  Memory: Recalls 3/3 objects at 5 minutes. No apparent deficits   Insight: Good  insight into current condition.  Mood: Pleasant affect, appropriate mood.  Sensation: Equal and intact in BL UE and Les.  Reflexes: 2+ in BL UE and LEs.  Strength: UEs 5-/5 throughout LLE 5-/5 throughout RLE- limited due to pain 3/5 in  HF, KE, KF, but 5-/5 distally    Physical exam unchanged from the above on reexamination 11/12/23     Assessment/Plan: 1. Functional deficits which require 3+ hours per day of interdisciplinary therapy in a comprehensive inpatient rehab setting. Physiatrist is providing close team supervision and 24 hour management of active medical problems listed below. Physiatrist and rehab team continue to assess barriers to discharge/monitor patient progress toward functional and medical goals  Care Tool:  Bathing    Body parts bathed by patient: Right arm, Left arm, Chest, Abdomen, Front perineal area, Buttocks, Right upper leg, Left upper leg, Face   Body parts bathed by helper: Right lower leg, Left lower leg     Bathing assist Assist Level: Minimal Assistance - Patient > 75%     Upper Body Dressing/Undressing Upper body dressing   What is the patient wearing?: Pull over shirt    Upper body assist Assist Level: Set up assist    Lower Body Dressing/Undressing Lower body dressing      What is the patient wearing?: Underwear/pull up, Pants     Lower body assist Assist for lower body dressing: Maximal Assistance - Patient 25 - 49%     Toileting Toileting    Toileting assist Assist for toileting: Minimal Assistance - Patient > 75%     Transfers Chair/bed transfer  Transfers assist     Chair/bed transfer assist level: Contact Guard/Touching assist     Locomotion Ambulation   Ambulation assist      Assist level: Contact Guard/Touching assist Assistive device: Walker-rolling Max distance: 100'   Walk 10 feet activity   Assist     Assist level: Contact Guard/Touching assist Assistive device: Walker-rolling   Walk 50 feet activity   Assist    Assist level: Contact Guard/Touching assist Assistive device: Walker-rolling    Walk 150 feet activity   Assist Walk 150 feet activity did not occur: Safety/medical concerns (nausea with 100' walk)          Walk 10 feet on uneven surface  activity   Assist Walk 10 feet on uneven surfaces activity did not occur: Safety/medical concerns         Wheelchair     Assist Is the patient using a wheelchair?: Yes Type of Wheelchair: Manual    Wheelchair assist level: Dependent - Patient 0%      Wheelchair 50 feet with 2 turns activity    Assist        Assist Level: Dependent - Patient 0%   Wheelchair 150 feet activity     Assist      Assist Level: Dependent - Patient 0%   Blood pressure 129/61, pulse 89, temperature 98.2 F (36.8 C), resp. rate 18, height 5' 11 (1.803 m), weight 68 kg, SpO2 97%.  Medical Problem List and Plan: 1. 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  Weightbearing as tolerated             -  patient may  shower-cover surgical dressing and skin tears             -ELOS/Goals: 12-14 days- supervision             - Stable to continue CIR  2.  Antithrombotics: -DVT/anticoagulation:  Pharmaceutical: Eliquis              -antiplatelet therapy: N/A 3. Pain Management: Robaxin 500 mg 3 times daily, hydrocodone 5-325 mg 1 tablet every 6 hours as needed  - Pain well-controlled on current regimen 4. Mood/Behavior/Sleep: Melatonin 3 mg nightly             -antipsychotic agents: N/A 5. Neuropsych/cognition: This patient is capable of making decisions on his own behalf. 6. Skin/Wound Care: Routine skin checks   - 10/29: L heel and b/l 1st toe DTIs; add prevalon boots when in bed  - 10/31: WOC consult for sacral unstageable, left heel/toe, and right arm eschar.  Cleanse coccyx/buttocks wound with Vashe, do not rinse and allow to air dry. Apply 1/4 thick layer of Santyl to wound bed, top with saline moist gauze, dry gauze and silicone foam.  May lift foam daily to replace Santyl, change foam q3 days and prn soiling.  Cleanse L heel and L great toe with soap and  water, dry and apply Xeroform gauze Soila [phone]) every other day to purple discoloration.  Place L foot in Prevalon boot to offload pressure.  Cleanse R arm wound with Vashe, apply Xeroform gauze to wound bed daily and secure with silicone foam or Kerlix roll gauze whichever is preferred.  Soak dressing with NS if adhered to wound bed for atraumatic removal.  7. Fluids/Electrolytes/Nutrition: Routine in and outs with follow-up chemistries  - Admission labs stable  8.  Acute blood loss anemia.  Transfused 1 unit packed red blood cells follow-up CBC  - Hemoglobin improved to 11 on admission labs; monitor  9.  Hypotension.  Norvasc  5 mg daily.  HCTZ 12.5 mg daily and lisinopril  20 mg daily currently on hold and resume as needed.  Monitor with increased mobility  - stable    11/12/2023    5:08 AM 11/11/2023    7:44 PM 11/11/2023    1:17 PM  Vitals with BMI  Systolic 129 104 894  Diastolic 61 63 61  Pulse 89 79 85    10.  Hyperlipidemia.  Lipitor/Zetia  11.  Constipation.  MiraLAX daily prn per pt request, Senokot-S 1 tablet twice daily  - Monitor today, and may need sorbitol  - 10-30: Sincrease sennakot to 2 tabs BID  - 10-31: No BMs recorded; will resume daily MiraLAX  12.  Diabetes mellitus.  SSI.  Hemoglobin A1c 6.2  - Blood sugars well-controlled, monitor Recent Labs    11/11/23 2120 11/12/23 0636 11/12/23 1133  GLUCAP 134* 140* 146*     13.  CAD/permanent pacemaker/ventricular tachycardia- Afib s/p conversion 6 weeks ago .  Continue Eliquis .  Follow-up cardiology services Dr.Crotoru  14.  AKI on CKD3A.  Cr got to  2.5- down to 1.3 Received gentle IV fluids.  Follow-up chemistries  - Admission labs with creatinine 1.3; trend  15. Glaucoma- con't drops 16.  Thrombocytosis.  Likely reactive, trend.  LOS: 3 days A FACE TO FACE EVALUATION WAS PERFORMED  Joesph JAYSON Likes 11/12/2023, 12:48 PM

## 2023-11-12 NOTE — Progress Notes (Signed)
 Physical Therapy Session Note  Patient Details  Name: Travis [name] MRN: [id number] Date of Birth: 1940-03-12  Today's Date: 11/12/2023 PT Individual Time: 9041-8950 + 8697-8652 PT Individual Time Calculation (min): 51 min + 45 min  Short Term Goals: Week 1:  PT Short Term Goal 1 (Week 1): Pt will complete bed mobility with supervision PT Short Term Goal 2 (Week 1): Pt will complete transfers wtih CGA PT Short Term Goal 3 (Week 1): Pt will complete gait 150' with LRAD CGA  Skilled Therapeutic Interventions/Progress Updates:    SESSION 1: Pt presents in room in bed, pt agreeable to PT. Pt denies pain. Session focused on therapeutic activities to facilitate independence with self care as well as activity tolerance and power production needed for transfers. Pt completes bed mobility with supervision, increased time and elevated HOB however minimal cues required for sequencing. Pt able to don L sock and L leg of pants, but requires mod assist for donning RLE sock/pant leg due to precautions and limited ROM. Pt completes sit to stand with CGA requests to use restroom, pt comes to sitting on BSC over toilet with supervision, pt able to manage 3/3 toileting tasks with distant supervision. Pt continent of bladder, charted. Pt ambulates with RW to main gym ~200' and comes to sitting on arm chair. Pt takes seated rest break during which pt provided with gingerale, however pt reporting only very slight nausea. Pt completes sit to stands x10 without device with CGA, completed to promote power production and immediate standing balance with transfers. Pt completes gait without device 20' with CGA, pt demonstrating short stride length and decreased stance time RLE. Pt requests to return to room urgently to use restroom, pt able to ambulate with RW, increased stride length and gait speed with improved stability, no LOB. Pt completes 3/3 toileting tasks with distant supervision, continent of bladder, charted. Pt  returns to sitting in WC in room and remains seated in [facility] with all needs within reach, cal light in place and chair alarm donned and activated at end of session.    SESSION 2: Pt presents in room in bed, eating lunch but reporting finished despite not eating much. Therapist encourages eating however pt continues to report finished with lunch and ready for therapy. Session focused on NMR for BLE muscle fiber recruitment, dynamic standing balance, and power production needed for functional transfers, gait, and reactive balance. Pt completes bed mobility with supervision, therapist dons shoes total assist for time management. Pt completes sit to stand with CGA, ambulates with supervision from room to ortho gym ~125' and comes to sitting on mat for rest break. Pt then completes NMR in standing including: - RDLs 3x5 (4# for 1st trial, 12# for 2nd/3rd) - sit to stand with press 3# weight 3x5 - step taps 4 step 2x20 - step ups 4 2x5 BLE (min assist, no UE support) Pt returns to room and remains seated in hallway with nursing staff chair alarm donned and activated as nursing staff switches beds at end of session.    Therapy Documentation Precautions:  Precautions Precautions: Fall Recall of Precautions/Restrictions: Impaired Precaution/Restrictions Comments: Unable to recall anterior approach precautions. Restrictions Weight Bearing Restrictions Per Provider Order: Yes RLE Weight Bearing Per Provider Order: Weight bearing as tolerated    Therapy/Group: Individual Therapy  Reche Ohara PT, DPT 11/12/2023, 1:42 PM

## 2023-11-12 NOTE — Progress Notes (Signed)
 Occupational Therapy Session Note  Patient Details  Name: Travis [name] MRN: [id number] Date of Birth: 10-14-1940  Today's Date: 11/12/2023 OT Individual Time: 1135-1200 OT Individual Time Calculation (min): 25 min    Short Term Goals: Week 1:  OT Short Term Goal 1 (Week 1): STGs=LTGs due to patient's estimated length of stay.  Skilled Therapeutic Interventions/Progress Updates:   Pt greeted sitting in [facility] for skilled OT session with focus on functional mobility and general conditioning. Sit<>stands and functional mobility (room<>ortho gym) with supervision + RW, cuing for upright posture and safe RW proximity. Pt completes ~8 mins of Nustep modality for BLE strengthening, tolerating level 1 resistance with OT assisting for placement of BLE into machine. Pt assisted with BLE elevation back into bed. Pt remains resting in bed with all immediate needs met.   Therapy Documentation Precautions:  Precautions Precautions: Fall Recall of Precautions/Restrictions: Impaired Precaution/Restrictions Comments: Unable to recall anterior approach precautions. Restrictions Weight Bearing Restrictions Per Provider Order: Yes RLE Weight Bearing Per Provider Order: Weight bearing as tolerated   Therapy/Group: Individual Therapy  Nereida Habermann, OTR/L, MSOT  11/12/2023, 7:15 AM

## 2023-11-12 NOTE — Progress Notes (Signed)
 Occupational Therapy Session Note  Patient Details  Name: Travis [name] MRN: [id number] Date of Birth: 1940-10-01  Today's Date: 11/12/2023 OT Individual Time: 1430-1500 OT Individual Time Calculation (min): 30 min    Short Term Goals: Week 1:  OT Short Term Goal 1 (Week 1): STGs=LTGs due to patient's estimated length of stay.  Skilled Therapeutic Interventions/Progress Updates:      Therapy Documentation Precautions:  Precautions Precautions: Fall Recall of Precautions/Restrictions: Impaired Precaution/Restrictions Comments: Unable to recall anterior approach precautions. Restrictions Weight Bearing Restrictions Per Provider Order: Yes RLE Weight Bearing Per Provider Order: Weight bearing as tolerated General: Pt seated in W/C upon OT arrival, agreeable to OT.  Pain: no pain reported  Exercises:Pt completed 10 minutes of arm bike on hills mode in order to increase BUE/BLEstrength and endurance in preparation for increased independence in ADLs such as functional mobility. Pt demonstrating good pacing and no rest breaks needed.   Other Treatments: OT providing therapeutic use of self in order to build rapport and discuss patient current situation and goals for therapy.   Pt seated in W/C at end of session with W/C alarm donned, call light within reach and 4Ps assessed.    Therapy/Group: Individual Therapy  Camie Hoe, OTD, OTR/L 11/12/2023, 4:27 PM

## 2023-11-12 NOTE — Consult Note (Signed)
 WOC Nurse Consult Note: Reason for Consult: sacral wound  Wound type: 1.  Unstageable Pressure Injury coccyx extending onto buttocks with tan necrotic tissue  2.  L heel and L distal great toe DTPI with purple maroon discoloration  3.  Full thickness R arm ? R/t trauma 50% red 50% dark eschar/dry hemorrhagic tissue  Pressure Injury POA: Yes Measurement: see nursing flowsheet  Wound bed: as above  Drainage (amount, consistency, odor) see nursing flowsheet  Periwound:purple maroon discoloration; appears to have started as deep tissue pressure injury  Dressing procedure/placement/frequency:  Cleanse coccyx/buttocks wound with Vashe, do not rinse and allow to air dry. Apply 1/4 thick layer of Santyl to wound bed, top with saline moist gauze, dry gauze and silicone foam.  May lift foam daily to replace Santyl, change foam q3 days and prn soiling.  Cleanse L heel and L great toe with soap and water, dry and apply Xeroform gauze Soila [phone]) every other day to purple discoloration.  Place L foot in Prevalon boot to offload pressure.  Cleanse R arm wound with Vashe, apply Xeroform gauze to wound bed daily and secure with silicone foam or Kerlix roll gauze whichever is preferred.  Soak dressing with NS if adhered to wound bed for atraumatic removal.   POC discussed with bedside nurse. WOC team will not follow.  Reconsult if further needs arise.   Thank you,    Powell Bar MSN, RN-BC, [employer]

## 2023-11-12 NOTE — Progress Notes (Signed)
 Initial Nutrition Assessment  DOCUMENTATION CODES:   Non-severe (moderate) malnutrition in context of social or environmental circumstances  INTERVENTION:  Liberalize diet to provide increased options to pt and promote adequate intake that supports wound healing  Discontinue Prosource, pt does not like supplement  Continue Juven BID and Glucerna BID  Recommend updated standing weight when able  NUTRITION DIAGNOSIS:   Moderate Malnutrition related to social / environmental circumstances (decreased appetite with aging) as evidenced by severe muscle depletion, moderate fat depletion.  GOAL:   Patient will meet greater than or equal to 90% of their needs  MONITOR:   PO intake, Supplement acceptance  REASON FOR ASSESSMENT:   Consult Wound healing  ASSESSMENT:   Pt with hx of sick sinus syndrome status post pacemaker/paroxysmal A-fib on Eliquis , CAD, CKD stage IIIa, diabetes mellitus type 2, glaucoma, recent fall leading to R hip fx s/p total hip arthoplasty (10/19). Recent admission 10/18-10/21 and 10/24-10/28 related to hip pain which lead to decreased PO intake and dehydration that caused an AKI requiring IV fluid administration. Admitted to CIR for comprehensive rehab.  Admission 10/18-10/21: R hip fx 10/19 s/p total hip arthroplasty  Admission 10/24-10/28: AKI/dehydration Received IV fluids  Admission to CIR 10/28 admitted  Spoke with pt who was resting in bed at time of assessment. Pt had full lunch tray in front of him with 0 bites taken from it. Pt reports appetite continues to be poor and states he just does not feel like eating. Pt endorses some nausea since being in rehab. Discussed the importance of adequate intake to support wound healing and recovery, and discussed liberalizing diet to regular to allow for more options to promote increased intake. Prior to pt's fall, pt was very active and ran each day and was eating well. Diabetes had been well managed with  Metformin  PTA. Pt does not need to be on restrictive diet 12-14 day rehab admission. Pt reports he did not like the Prosource supplement but is agreeable to continue Glucerna and Juven.  Pt reports he was eating about 3 meals per day and had no appetite issues prior to his fall. Pt's appetite started to decline once he was home after his hip surgery. Pt lives alone but was very independent before. Pt feels like his wt has been stable, however wt appears to have decreased over the last few months. Weight loss not significant for timeframe but concerning given pt's current condition. Physical exam also shows Moderate fat depletions and severe muscle depletions, indicative of malnutrition. Suspect pt's appetite has decreased overall related to aging which likely increased his risk that led to hip fx.   Average Meal Completion: 10/29-10/31: 36% average intake x 8 recorded meals   Medications: Vitamin C 500 mg daily Vitamin D3 1000 units daily Glucerna BID SSI 0-15 units TID Juven BID Senna Zinc  sulfate daily  Labs:  CBG x 24 hr: 89-177 mg/dL J8r 6.2   NUTRITION - FOCUSED PHYSICAL EXAM:  Flowsheet Row Most Recent Value  Orbital Region Moderate depletion  Upper Arm Region Moderate depletion  Thoracic and Lumbar Region Moderate depletion  Buccal Region Moderate depletion  Temple Region Mild depletion  Clavicle Bone Region Severe depletion  Clavicle and Acromion Bone Region Severe depletion  Scapular Bone Region Severe depletion  Dorsal Hand Severe depletion  Patellar Region Severe depletion  Anterior Thigh Region Severe depletion  Posterior Calf Region Unable to assess  [in boots]  Edema (RD Assessment) Unable to assess  Hair Reviewed  Eyes Reviewed  Mouth Reviewed  Skin Reviewed  Nails Reviewed    Diet Order:   Diet Order             Diet regular Room service appropriate? Yes with Assist; Fluid consistency: Thin  Diet effective now                   EDUCATION  NEEDS:   Education needs have been addressed  Skin:  Skin Assessment: Skin Integrity Issues: Skin Integrity Issues:: DTI, Unstageable DTI: L heel, R toe Unstageable: coccyx Incisions: R hip  Last BM:  10/29  Height:   Ht Readings from Last 1 Encounters:  11/09/23 5' 11 (1.803 m)    Weight:   Wt Readings from Last 1 Encounters:  11/09/23 68 kg  *unsure of accuracy of wt, appears copy forwarded*  Ideal Body Weight:  78.18 kg  BMI:  Body mass index is 20.92 kg/m.  Estimated Nutritional Needs:   Kcal:  1700-1900  Protein:  70-90g  Fluid:  1.7-1.9L    Josette Glance, MS, RDN, LDN Clinical Dietitian I Please reach out via secure chat

## 2023-11-12 NOTE — Progress Notes (Signed)
 Occupational Therapy Session Note  Patient Details  Name: Travis [name] MRN: [id number] Date of Birth: Dec 14, 1940  Today's Date: 11/12/2023 OT Individual Time: 1553-1640 OT Individual Time Calculation (min): 47 min    Short Term Goals: Week 1:  OT Short Term Goal 1 (Week 1): STGs=LTGs due to patient's estimated length of stay.  Skilled Therapeutic Interventions/Progress Updates:   Patient agreeable to participate in OT session. Reports 2/10 pain level.   Patient participated in skilled OT session focusing on standing tolerance, UE function via wc mobility, dynamic balance. Patient received in wc ready for OT. Completed wc mobility 150 ft. Patient then completed sit to stand x6 with 3 sets to increase LE strength. Patient able to complete final set without RW. Patient then progressed to step ups on 6 platform. Patient completed x5. OT educated on proper sequencing and positioning of legs to decrease pain. Patient able to complete 2 sets of 5 step ups with decreased pain. Patient completed 2 rounds of dynamic balance horseshoe tossing with good balance CGA. Patient then completed functional mobility to room 200 ft with RW with good speed and good balance CGA. Patient returned to bed, alarm on all needs in reach.   Therapy Documentation Precautions:  Precautions Precautions: Fall Recall of Precautions/Restrictions: Impaired Precaution/Restrictions Comments: Unable to recall anterior approach precautions. Restrictions Weight Bearing Restrictions Per Provider Order: Yes RLE Weight Bearing Per Provider Order: Weight bearing as tolerated  Therapy/Group: Individual Therapy  D'mariea L Jacquelyn Antony 11/12/2023, 4:30 PM

## 2023-11-12 NOTE — Progress Notes (Signed)
 Met with patient to review current situation, team conference and plan of care. Reviewed medications, incision care, dietary modifications. Reviewed bowel, bladder, pain. Continue  to follow along to provide educational needs to facilitate preparation for discharge.

## 2023-11-12 NOTE — Progress Notes (Signed)
 Inpatient Rehabilitation Care Coordinator Assessment and Plan Patient Details  Name: Travis [name] MRN: [id number] Date of Birth: 10/21/1940  Today's Date: 11/12/2023  Hospital Problems: Principal Problem:   Fracture of femoral neck, right (HCC) Active Problems:   H/O total hip arthroplasty, right   Debility  Past Medical History:  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   SSS (sick sinus syndrome) (HCC) 06/10/2016   Type II diabetes mellitus (HCC)    Past Surgical History:  Past Surgical History:  Procedure Laterality Date   APPENDECTOMY     CARDIAC CATHETERIZATION  06/10/2016   CARDIOVERSION N/A 10/01/2023   Procedure: CARDIOVERSION;  Surgeon: Francyne Headland, MD;  Location: [facility];  Service: Cardiovascular;  Laterality: N/A;   CATARACT EXTRACTION W/ INTRAOCULAR LENS IMPLANT Right    CYSTOSCOPY W/ STONE MANIPULATION     INSERT / REPLACE / REMOVE PACEMAKER  06/10/2016   dual chamber permanent pacemaker   KNEE ARTHROSCOPY W/ MENISCECTOMY Bilateral    LAPAROSCOPIC CHOLECYSTECTOMY     LEFT HEART CATH AND CORONARY ANGIOGRAPHY N/A 06/10/2016   Procedure: Left Heart Cath and Coronary Angiography;  Surgeon: Cherrie Toribio SAUNDERS, MD;  Location: [facility];  Service: Cardiovascular;  Laterality: N/A;   LITHOTRIPSY     PACEMAKER IMPLANT N/A 06/10/2016   Procedure: Pacemaker Implant;  Surgeon: Francyne Headland, MD;  Location: [facility];  Service: Cardiovascular;  Laterality: N/A;   TONSILLECTOMY     TOTAL HIP ARTHROPLASTY Right 10/31/2023   Procedure: RIGHT TOTAL HIP ARTHROPLASTY,  ANTERIOR APPROACH;  Surgeon: Ernie Cough, MD;  Location: [facility] OR;  Service: Orthopedics;  Laterality: Right;   Social History:   reports that he quit smoking about 42 years ago. His smoking use included cigarettes. He started smoking about 67 years ago. He has a 12.5 pack-year smoking history. He has never used smokeless tobacco. He reports current alcohol use. He reports that he does not use drugs.  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, Jewellery - How often do you need to have someone help you when you read instructions, pamphlets, or other written material from your doctor or pharmacy?: Never Writes: Yes Employment Status: Retired Date Retired/Disabled/Unemployed: 2013 Marine Scientist Issues: Denies Guardian/Conservator: N/A   Abuse/Neglect Abuse/Neglect Assessment Can Be Completed: Yes Physical Abuse: Denies Verbal Abuse: Denies Sexual Abuse: Denies Exploitation of patient/patient's resources: Denies Self-Neglect: Denies  Patient response to: Social Isolation - How often do you feel lonely or isolated from those around you?: Rarely  Emotional Status Pt's affect, behavior and adjustment status: Pt in good spirits at time of visit Recent Psychosocial Issues: Denies Psychiatric History: Denies Substance Abuse History: Pt admits to a couple of beers per month. Denies tobacco products or rec drug use  Patient /  Family Perceptions, Expectations & Goals Pt/Family understanding of illness & functional limitations: Pt and family have a  general understanding of care needs Premorbid pt/family roles/activities: Independent Anticipated changes in roles/activities/participation: Assistance with ADLs/IADLs Pt/family expectations/goals: pt goal is to work on walking on own again and forpain to go away.  Community Resources [employer]: None Premorbid Home Care/DME Agencies: None Transportation available at discharge: TBD Is the patient able to respond to transportation needs?: Yes In the past 12 months, has lack of transportation kept you from medical appointments or from getting medications?: No In the past 12 months, has lack of transportation kept you from meetings, work, or from getting things needed for daily living?: No Resource referrals recommended: Neuropsychology  Discharge Planning Living Arrangements: Alone Support Systems: Children, Other relatives Type of Residence: Private residence Insurance Resources: [employer] Financial Resources: Social Security Financial Screen Referred: No Living Expenses: Own Money Management: Patient Does the patient have any problems obtaining your medications?: No Home Management: Pt manages all home care needs; has mainly take out for meals. Patient/Family Preliminary Plans: TBD Care Coordinator Barriers to Discharge: Decreased caregiver support, Lack of/limited family support Care Coordinator Anticipated Follow Up Needs: HH/OP Expected length of stay: 7-14 days  Clinical Impression SW met with pt in room to introduce self, explain role, discuss discharge process and inform on ELOS. Pt has no DME is needed.    Monay Houlton A Roel Douthat 11/12/2023, 1:53 PM

## 2023-11-12 NOTE — Telephone Encounter (Signed)
 Patient in rehab per 11/09/23 note

## 2023-11-13 DIAGNOSIS — E1122 Type 2 diabetes mellitus with diabetic chronic kidney disease: Secondary | ICD-10-CM | POA: Diagnosis not present

## 2023-11-13 DIAGNOSIS — N1831 Chronic kidney disease, stage 3a: Secondary | ICD-10-CM

## 2023-11-13 DIAGNOSIS — I959 Hypotension, unspecified: Secondary | ICD-10-CM

## 2023-11-13 DIAGNOSIS — D62 Acute posthemorrhagic anemia: Secondary | ICD-10-CM | POA: Diagnosis not present

## 2023-11-13 DIAGNOSIS — S72001D Fracture of unspecified part of neck of right femur, subsequent encounter for closed fracture with routine healing: Secondary | ICD-10-CM | POA: Diagnosis not present

## 2023-11-13 DIAGNOSIS — K5900 Constipation, unspecified: Secondary | ICD-10-CM

## 2023-11-13 DIAGNOSIS — Z794 Long term (current) use of insulin: Secondary | ICD-10-CM

## 2023-11-13 DIAGNOSIS — R5381 Other malaise: Secondary | ICD-10-CM | POA: Diagnosis not present

## 2023-11-13 LAB — GLUCOSE, CAPILLARY
Glucose-Capillary: 131 mg/dL — ABNORMAL HIGH (ref 70–99)
Glucose-Capillary: 129 mg/dL — ABNORMAL HIGH (ref 70–99)
Glucose-Capillary: 193 mg/dL — ABNORMAL HIGH (ref 70–99)
Glucose-Capillary: 146 mg/dL — ABNORMAL HIGH (ref 70–99)

## 2023-11-13 NOTE — Progress Notes (Signed)
 Physical Therapy Session Note  Patient Details  Name: Travis [name] MRN: [id number] Date of Birth: 07-16-1940  Today's Date: 11/13/2023 PT Individual Time: 0930-1015 PT Individual Time Calculation (min): 45 min   Short Term Goals: Week 1:  PT Short Term Goal 1 (Week 1): Pt will complete bed mobility with supervision PT Short Term Goal 2 (Week 1): Pt will complete transfers wtih CGA PT Short Term Goal 3 (Week 1): Pt will complete gait 150' with LRAD CGA  Skilled Therapeutic Interventions/Progress Updates: Patient sitting in WC on entrance to room. Patient alert and agreeable to PT session.   Patient with no complaints of pain during session   Therapeutic Activity: Transfers: Pt performed sit<>stand transfers throughout session with RW in preparation for functional mobility with supervision - Provided pt with ROHO cushion due to reported pressure sore on buttocks with pt stating increased comfort sitting in ROHO vs sitting on pillow on top of standard cushion.  Therapeutic Exercise: Pt performed the following exercises with therapist providing the described cuing and facilitation for improvement. - Pt propelled WC from room<main gym with B UE's to increase endurance and B UE strength.  - Pt ambulated 200' x 2, 400' x 1 with rest breaks required in RW to increase cardiovascular endurance. VC provided to decrease WB on B UE's and to maintain safe proximity to RW.   Patient sitting in WC at end of session with brakes locked, belt alarm set, and all needs within reach.      Therapy Documentation Precautions:  Precautions Precautions: Fall Recall of Precautions/Restrictions: Impaired Precaution/Restrictions Comments: Unable to recall anterior approach precautions. Restrictions Weight Bearing Restrictions Per Provider Order: Yes RLE Weight Bearing Per Provider Order: Weight bearing as tolerated   Therapy/Group: Individual Therapy  Blaire Palomino PTA 11/13/2023, 10:18 AM

## 2023-11-13 NOTE — Progress Notes (Signed)
 Physical Therapy Session Note  Patient Details  Name: Travis [name] MRN: [id number] Date of Birth: 08-Aug-1940  Today's Date: 11/13/2023 PT Individual Time: 1115-1155  PT Individual Time Calculation (min): 40 min  Short Term Goals: Week 1:  PT Short Term Goal 1 (Week 1): Pt will complete bed mobility with supervision PT Short Term Goal 2 (Week 1): Pt will complete transfers wtih CGA PT Short Term Goal 3 (Week 1): Pt will complete gait 150' with LRAD CGA  Skilled Therapeutic Interventions/Progress Updates:  Chart reviewed and pt agreeable to therapy. Pt received seated in WC with 3/10 c/o pain in L upper leg and hip. Session focused on functional transfers, balance, and ambulation to promote safe home mobility and access. Pt taken to therapy gym for time management. Pt initiated session with blocked practice of step up/down at 3inch step with pt educated on safe step to pattern on stairs. Pt required CGA fading to S + B rails. Pt then completed blocked practice of 16 step stair navigation first using B hand rails + close S and fading to CGA + R rail ascending after multiple trials. Pt noted to progress to step over pattern when using B rail support. Pt then amb 228ft using S + Rw to return to room. Session education emphasized safe use of DME and anterior hip precautions which pt verbalized. At end of session, pt was left seated in [facility] with alarm engaged, nurse call bell and all needs in reach.     Therapy Documentation Precautions:  Precautions Precautions: Fall Recall of Precautions/Restrictions: Impaired Precaution/Restrictions Comments: Unable to recall anterior approach precautions. Restrictions Weight Bearing Restrictions Per Provider Order: Yes RLE Weight Bearing Per Provider Order: Weight bearing as tolerated General:      Therapy/Group: Individual Therapy   [name] 11/13/2023, 11:57 AM

## 2023-11-13 NOTE — Progress Notes (Signed)
 Occupational Therapy Session Note  Patient Details  Name: Travis [name] MRN: [id number] Date of Birth: Apr 24, 1940  Session 1 Today's Date: 11/13/2023 OT Individual Time: 1300-1400 OT Individual Time Calculation (min): 60 min  Session 2 Today's Date: 11/13/2023 OT Individual Time: 1446-1530 OT Individual Time Calculation (min): 44 min    Short Term Goals: Week 1:  OT Short Term Goal 1 (Week 1): STGs=LTGs due to patient's estimated length of stay.  Skilled Therapeutic Interventions/Progress Updates:  Session 1: Skilled OT session completed to address ADL retraining and dynamic standing balance. Pt received seated in WC, agreeable to participate in therapy. Pt reports no pain.  Pt requested to void, functional mobility completed to toilet with RW completed with CGA. Pt completes 3/3 steps to toileting seated at 3n1 over toilet with supervision to adhere to anterior hip precautions. Pt completed hand hygiene in standing. Pt dependently propelled to gym. OT provided education and demo on the following AE to increase independence in LB dressing/bathing with adherence to anterior hip precautions: dressing stick, reacher, LH sponge, LH shoe horn, and elastic shoe laces. Pt returned demo with VC for proper technique with shoe horn, reacher, and dressing stick to don/doff shoes. Pt respectfully declined use of elastic shoes laces at this time; OT did provide in room.   Pt completed corn hole standing at RW to increase dynamic standing balance and tolerance to weight bearing in RLE. Pt completed activity with CGA requiring 2 seated rest breaks. No LOB noted during activity. Pt completed functional mobility with RW ~156ft back to room. Pt seated in [facility] with chair alarm on and all needs within reach.  Session 2: Skilled OT session completed to address BUE/BLE strengthening and functional mobility. Pt received seated in WC, agreeable to participate in therapy. Pt reports no pain.  Pt dependently propelled  to gym. Pt completed the following UB exs with standing at RW to increase BUE strength and tolerance to WB in RLE as it relates to functional activity tolerance:  -3x10 bicep curls with 8lb dumbbell -3x10 lateral raises with 5lb dumbbell -3x10 overhead raises with 5lb dumbbell Pt required seated rest break and completed 3x10 seated rows with 5lb weight.  Pt completed 3 trials of 2 minute standing at therapeutic rebounder to increase standing activity tolerance and weight bearing in RLE. Pt reporting no difficulties weight bearing in activity; however, experiencing more fatigue. Required 2 seated rest breaks during task. Pt completed functional mobility with RW ~175ft back to room. Pt seated in [facility] with chair alarm on, and all needs within reach.  Therapy Documentation Precautions:  Precautions Precautions: Fall Recall of Precautions/Restrictions: Impaired Precaution/Restrictions Comments: Unable to recall anterior approach precautions. Restrictions Weight Bearing Restrictions Per Provider Order: Yes RLE Weight Bearing Per Provider Order: Weight bearing as tolerated    Therapy/Group: Individual Therapy  Starasia Sinko [place]-Chance, MS, OTR/L 11/13/2023, 7:47 AM

## 2023-11-13 NOTE — Progress Notes (Signed)
 PROGRESS NOTE   Subjective/Complaints:  No new complaints or concerns this AM. Reports nursing getting him new low air loss mattress for pressure injury. First mattress not working correctly.   ROS: Denies fevers, HA, chills, N/V, abdominal pain, constipation, diarrhea, SOB, cough, chest pain, new weakness or paraesthesias.   Right hip pain  Objective:   No results found.  No results for input(s): WBC, HGB, HCT, PLT in the last 72 hours.  No results for input(s): NA, K, CL, CO2, GLUCOSE, BUN, CREATININE, CALCIUM  in the last 72 hours.   Intake/Output Summary (Last 24 hours) at 11/13/2023 1130 Last data filed at 11/13/2023 0551 Gross per 24 hour  Intake 560 ml  Output 200 ml  Net 360 ml     Wound 11/09/23 1600 Pressure Injury Coccyx Mid Unstageable - Full thickness tissue loss in which the base of the injury is covered by slough (yellow, tan, gray, green or brown) and/or eschar (tan, brown or black) in the wound bed. (Active)     Wound 11/09/23 1600 Pressure Injury Heel Left Deep Tissue Pressure Injury - Purple or maroon localized area of discolored intact skin or blood-filled blister due to damage of underlying soft tissue from pressure and/or shear. (Active)     Wound 11/09/23 1600 Pressure Injury Toe (Comment  which one) Anterior;Right Deep Tissue Pressure Injury - Purple or maroon localized area of discolored intact skin or blood-filled blister due to damage of underlying soft tissue from pressure and/or shear (Active)    Physical Exam: Vital Signs Blood pressure 107/67, pulse 95, temperature 98.4 F (36.9 C), temperature source Oral, resp. rate 19, height 5' 11 (1.803 m), weight 68 kg, SpO2 99%. Constitutional: No apparent distress. Appropriate appearance for age.  HENT: No JVD. Neck Supple. Trachea midline. Atraumatic, normocephalic. Eyes: PERRLA. Cardiovascular: RRR, no murmurs/rub/gallops.  No further R hip Edema. Peripheral pulses 2+  Respiratory: CTAB. No rales, rhonchi, or wheezing. On RA.  Abdomen: + bowel sounds, normoactive. No distention or tenderness.  Skin: C/D/I. No apparent lesions.   + R hip Surgical site with minimal drainage--stable + R elbow skin tear - c/d/I.  + blancheable erythema bilateral 1st toes and heels--covered in mepilex  MSK:      No apparent deformity. + R hip ROM deficit d/t pain   Neurologic exam: Alert and oriented x4, follows commands, moving all 4 extremities in bed, CN grossly intact    Prior exam Cognition: AAO to person, place, time and event.   Language: Fluent, No substitutions or neoglisms. No dysarthria.  Memory: Recalls 3/3 objects at 5 minutes. No apparent deficits   Insight: Good  insight into current condition.  Mood: Pleasant affect, appropriate mood.  Sensation: Equal and intact in BL UE and Les.  Reflexes: 2+ in BL UE and LEs.  Strength: UEs 5-/5 throughout LLE 5-/5 throughout RLE- limited due to pain 3/5 in HF, KE, KF, but 5-/5 distally    Physical exam unchanged from the above on reexamination 11/13/23     Assessment/Plan: 1. Functional deficits which require 3+ hours per day of interdisciplinary therapy in a comprehensive inpatient rehab setting. Physiatrist is providing close team supervision and 24 hour management of  active medical problems listed below. Physiatrist and rehab team continue to assess barriers to discharge/monitor patient progress toward functional and medical goals  Care Tool:  Bathing    Body parts bathed by patient: Right arm, Left arm, Chest, Abdomen, Front perineal area, Buttocks, Right upper leg, Left upper leg, Face   Body parts bathed by helper: Right lower leg, Left lower leg     Bathing assist Assist Level: Minimal Assistance - Patient > 75%     Upper Body Dressing/Undressing Upper body dressing   What is the patient wearing?: Pull over shirt    Upper body assist Assist  Level: Set up assist    Lower Body Dressing/Undressing Lower body dressing      What is the patient wearing?: Underwear/pull up, Pants     Lower body assist Assist for lower body dressing: Maximal Assistance - Patient 25 - 49%     Toileting Toileting    Toileting assist Assist for toileting: Minimal Assistance - Patient > 75%     Transfers Chair/bed transfer  Transfers assist     Chair/bed transfer assist level: Contact Guard/Touching assist     Locomotion Ambulation   Ambulation assist      Assist level: Contact Guard/Touching assist Assistive device: Walker-rolling Max distance: 100'   Walk 10 feet activity   Assist     Assist level: Contact Guard/Touching assist Assistive device: Walker-rolling   Walk 50 feet activity   Assist    Assist level: Contact Guard/Touching assist Assistive device: Walker-rolling    Walk 150 feet activity   Assist Walk 150 feet activity did not occur: Safety/medical concerns (nausea with 100' walk)         Walk 10 feet on uneven surface  activity   Assist Walk 10 feet on uneven surfaces activity did not occur: Safety/medical concerns         Wheelchair     Assist Is the patient using a wheelchair?: Yes Type of Wheelchair: Manual    Wheelchair assist level: Dependent - Patient 0%      Wheelchair 50 feet with 2 turns activity    Assist        Assist Level: Dependent - Patient 0%   Wheelchair 150 feet activity     Assist      Assist Level: Dependent - Patient 0%   Blood pressure 107/67, pulse 95, temperature 98.4 F (36.9 C), temperature source Oral, resp. rate 19, height 5' 11 (1.803 m), weight 68 kg, SpO2 99%.  Medical Problem List and Plan: 1. 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  Weightbearing as tolerated             -patient may  shower-cover surgical dressing and skin  tears             -ELOS/Goals: 12-14 days- supervision             - Stable to continue CIR  2.  Antithrombotics: -DVT/anticoagulation:  Pharmaceutical: Eliquis              -antiplatelet therapy: N/A 3. Pain Management: Robaxin 500 mg 3 times daily, hydrocodone 5-325 mg 1 tablet every 6 hours as needed  - Pain well-controlled on current regimen 4. Mood/Behavior/Sleep: Melatonin 3 mg nightly             -antipsychotic agents: N/A 5. Neuropsych/cognition: This patient is capable of making decisions on his own behalf. 6. Skin/Wound Care: Routine skin  checks   - 10/29: L heel and b/l 1st toe DTIs; add prevalon boots when in bed  - 10/31: WOC consult for sacral unstageable, left heel/toe, and right arm eschar.  Cleanse coccyx/buttocks wound with Vashe, do not rinse and allow to air dry. Apply 1/4 thick layer of Santyl to wound bed, top with saline moist gauze, dry gauze and silicone foam.  May lift foam daily to replace Santyl, change foam q3 days and prn soiling.  Cleanse L heel and L great toe with soap and water, dry and apply Xeroform gauze Soila [phone]) every other day to purple discoloration.  Place L foot in Prevalon boot to offload pressure.  Cleanse R arm wound with Vashe, apply Xeroform gauze to wound bed daily and secure with silicone foam or Kerlix roll gauze whichever is preferred.  Soak dressing with NS if adhered to wound bed for atraumatic removal.  7. Fluids/Electrolytes/Nutrition: Routine in and outs with follow-up chemistries  - Admission labs stable  8.  Acute blood loss anemia.  Transfused 1 unit packed red blood cells follow-up CBC  - Hemoglobin improved to 11 on admission labs; monitor  Recheck monday  9.  Hypotension.  Norvasc  5 mg daily.  HCTZ 12.5 mg daily and lisinopril  20 mg daily currently on hold and resume as needed.  Monitor with increased mobility  - stable    11/13/2023    5:49 AM 11/12/2023   10:06 PM 11/12/2023    2:10 PM  Vitals with BMI  Systolic 107  102 97  Diastolic 67 61 59  Pulse 95 82 85  -11/1 BP soft but stable, continue to monitor   10.  Hyperlipidemia.  Lipitor/Zetia  11.  Constipation.  MiraLAX daily prn per pt request, Senokot-S 1 tablet twice daily  - Monitor today, and may need sorbitol  - 10-30: Sincrease sennakot to 2 tabs BID  - 10-31: No BMs recorded; will resume daily MiraLAX  -11/1 LBM today, continue current  12.  Diabetes mellitus.  SSI.  Hemoglobin A1c 6.2  - Blood sugars well-controlled, monitor Recent Labs    11/12/23 1647 11/12/23 2207 11/13/23 0623  GLUCAP 141* 148* 131*    -11/1 CBGs controlled, continue to monitor 13.  CAD/permanent pacemaker/ventricular tachycardia- Afib s/p conversion 6 weeks ago .  Continue Eliquis .  Follow-up cardiology services Dr.Crotoru  14.  AKI on CKD3A.  Cr got to  2.5- down to 1.3 Received gentle IV fluids.  Follow-up chemistries  - Admission labs with creatinine 1.3; trend  15. Glaucoma- con't drops 16.  Thrombocytosis.  Likely reactive, trend.  LOS: 4 days A FACE TO FACE EVALUATION WAS PERFORMED  Murray Collier 11/13/2023, 11:30 AM

## 2023-11-13 NOTE — Plan of Care (Signed)
  Problem: Consults Goal: RH GENERAL PATIENT EDUCATION Description: See Patient Education module for education specifics. Outcome: Progressing Goal: Nutrition Consult-if indicated Outcome: Progressing Goal: Diabetes Guidelines if Diabetic/Glucose > 140 Description: If diabetic or lab glucose is > 140 mg/dl - Initiate Diabetes/Hyperglycemia Guidelines & Document Interventions  Outcome: Progressing   Problem: RH BOWEL ELIMINATION Goal: RH STG MANAGE BOWEL WITH ASSISTANCE Description: STG Manage Bowel with mod I Assistance. Outcome: Progressing Goal: RH STG MANAGE BOWEL W/MEDICATION W/ASSISTANCE Description: STG Manage Bowel with Medication with mod I Assistance. Outcome: Progressing   Problem: RH SAFETY Goal: RH STG ADHERE TO SAFETY PRECAUTIONS W/ASSISTANCE/DEVICE Description: STG Adhere to Safety Precautions With cues Assistance/Device. Outcome: Progressing   Problem: RH PAIN MANAGEMENT Goal: RH STG PAIN MANAGED AT OR BELOW PT'S PAIN GOAL Description: Pain < 4 with prns Outcome: Progressing   Problem: RH KNOWLEDGE DEFICIT GENERAL Goal: RH STG INCREASE KNOWLEDGE OF SELF CARE AFTER HOSPITALIZATION Description: Patient and dtr will be able to manage care at discharge using educational resources for medications and dietary modification independently Outcome: Progressing   Problem: Education: Goal: Ability to describe self-care measures that may prevent or decrease complications (Diabetes Survival Skills Education) will improve Outcome: Progressing Goal: Individualized Educational Video(s) Outcome: Progressing   Problem: Coping: Goal: Ability to adjust to condition or change in health will improve Outcome: Progressing   Problem: Fluid Volume: Goal: Ability to maintain a balanced intake and output will improve Outcome: Progressing   Problem: Health Behavior/Discharge Planning: Goal: Ability to identify and utilize available resources and services will improve Outcome:  Progressing Goal: Ability to manage health-related needs will improve Outcome: Progressing   Problem: Metabolic: Goal: Ability to maintain appropriate glucose levels will improve Outcome: Progressing   Problem: Nutritional: Goal: Maintenance of adequate nutrition will improve Outcome: Progressing Goal: Progress toward achieving an optimal weight will improve Outcome: Progressing   Problem: Skin Integrity: Goal: Risk for impaired skin integrity will decrease Outcome: Progressing   Problem: Tissue Perfusion: Goal: Adequacy of tissue perfusion will improve Outcome: Progressing

## 2023-11-14 DIAGNOSIS — S72001D Fracture of unspecified part of neck of right femur, subsequent encounter for closed fracture with routine healing: Secondary | ICD-10-CM | POA: Diagnosis not present

## 2023-11-14 DIAGNOSIS — R5381 Other malaise: Secondary | ICD-10-CM | POA: Diagnosis not present

## 2023-11-14 DIAGNOSIS — K5900 Constipation, unspecified: Secondary | ICD-10-CM | POA: Diagnosis not present

## 2023-11-14 DIAGNOSIS — E1122 Type 2 diabetes mellitus with diabetic chronic kidney disease: Secondary | ICD-10-CM | POA: Diagnosis not present

## 2023-11-14 LAB — GLUCOSE, CAPILLARY
Glucose-Capillary: 165 mg/dL — ABNORMAL HIGH (ref 70–99)
Glucose-Capillary: 178 mg/dL — ABNORMAL HIGH (ref 70–99)
Glucose-Capillary: 98 mg/dL (ref 70–99)
Glucose-Capillary: 299 mg/dL — ABNORMAL HIGH (ref 70–99)

## 2023-11-14 NOTE — Progress Notes (Signed)
 PROGRESS NOTE   Subjective/Complaints:  No new complaints this AM.   ROS: Denies fevers, HA, chills, N/V, abdominal pain, constipation, diarrhea, SOB, cough, chest pain, new weakness or paraesthesias, mood change.     Objective:   No results found.  No results for input(s): WBC, HGB, HCT, PLT in the last 72 hours.  No results for input(s): NA, K, CL, CO2, GLUCOSE, BUN, CREATININE, CALCIUM  in the last 72 hours.   Intake/Output Summary (Last 24 hours) at 11/14/2023 1214 Last data filed at 11/14/2023 0730 Gross per 24 hour  Intake 708 ml  Output 200 ml  Net 508 ml     Wound 11/09/23 1600 Pressure Injury Coccyx Mid Unstageable - Full thickness tissue loss in which the base of the injury is covered by slough (yellow, tan, gray, green or brown) and/or eschar (tan, brown or black) in the wound bed. (Active)     Wound 11/09/23 1600 Pressure Injury Heel Left Deep Tissue Pressure Injury - Purple or maroon localized area of discolored intact skin or blood-filled blister due to damage of underlying soft tissue from pressure and/or shear. (Active)     Wound 11/09/23 1600 Pressure Injury Toe (Comment  which one) Anterior;Right Deep Tissue Pressure Injury - Purple or maroon localized area of discolored intact skin or blood-filled blister due to damage of underlying soft tissue from pressure and/or shear (Active)    Physical Exam: Vital Signs Blood pressure 135/73, pulse 98, temperature 98.6 F (37 C), temperature source Oral, resp. rate 19, height 5' 11 (1.803 m), weight 68 kg, SpO2 100%. Constitutional: No apparent distress. Appropriate appearance for age. Sitting in WC resting head on pillow on his tray table-reports he is comfortable this way HENT: No JVD. Neck Supple. Trachea midline. Atraumatic, normocephalic. Eyes: PERRLA. Cardiovascular: RRR, no murmurs/rub/gallops. No further R hip Edema. Peripheral  pulses 2+  Respiratory: CTAB. No rales, rhonchi, or wheezing. On RA.  Abdomen: + bowel sounds, normoactive. No distention or tenderness.  Skin: C/D/I. No apparent lesions.   + R hip Surgical site with minimal drainage--stable + R elbow skin tear - c/d/I.  + blancheable erythema bilateral 1st toes and heels--covered in mepilex  MSK:      No apparent deformity. + R hip ROM deficit d/t pain  Neurologic exam: Alert and oriented x4, follows commands, moving all 4 extremities in bed, CN grossly intact    Prior exam Cognition: AAO to person, place, time and event.   Language: Fluent, No substitutions or neoglisms. No dysarthria.  Memory: Recalls 3/3 objects at 5 minutes. No apparent deficits   Insight: Good  insight into current condition.  Mood: Pleasant affect, appropriate mood.  Sensation: Equal and intact in BL UE and Les.  Reflexes: 2+ in BL UE and LEs.  Strength: UEs 5-/5 throughout LLE 5-/5 throughout RLE- limited due to pain 3/5 in HF, KE, KF, but 5-/5 distally    Physical exam unchanged from the above on reexamination 11/14/23     Assessment/Plan: 1. Functional deficits which require 3+ hours per day of interdisciplinary therapy in a comprehensive inpatient rehab setting. Physiatrist is providing close team supervision and 24 hour management of active medical problems listed below.  Physiatrist and rehab team continue to assess barriers to discharge/monitor patient progress toward functional and medical goals  Care Tool:  Bathing    Body parts bathed by patient: Right arm, Left arm, Chest, Abdomen, Front perineal area, Buttocks, Right upper leg, Left upper leg, Face   Body parts bathed by helper: Right lower leg, Left lower leg     Bathing assist Assist Level: Minimal Assistance - Patient > 75%     Upper Body Dressing/Undressing Upper body dressing   What is the patient wearing?: Pull over shirt    Upper body assist Assist Level: Set up assist    Lower Body  Dressing/Undressing Lower body dressing      What is the patient wearing?: Underwear/pull up, Pants     Lower body assist Assist for lower body dressing: Maximal Assistance - Patient 25 - 49%     Toileting Toileting    Toileting assist Assist for toileting: Minimal Assistance - Patient > 75%     Transfers Chair/bed transfer  Transfers assist     Chair/bed transfer assist level: Contact Guard/Touching assist     Locomotion Ambulation   Ambulation assist      Assist level: Contact Guard/Touching assist Assistive device: Walker-rolling Max distance: 100'   Walk 10 feet activity   Assist     Assist level: Contact Guard/Touching assist Assistive device: Walker-rolling   Walk 50 feet activity   Assist    Assist level: Contact Guard/Touching assist Assistive device: Walker-rolling    Walk 150 feet activity   Assist Walk 150 feet activity did not occur: Safety/medical concerns (nausea with 100' walk)         Walk 10 feet on uneven surface  activity   Assist Walk 10 feet on uneven surfaces activity did not occur: Safety/medical concerns         Wheelchair     Assist Is the patient using a wheelchair?: Yes Type of Wheelchair: Manual    Wheelchair assist level: Dependent - Patient 0%      Wheelchair 50 feet with 2 turns activity    Assist        Assist Level: Dependent - Patient 0%   Wheelchair 150 feet activity     Assist      Assist Level: Dependent - Patient 0%   Blood pressure 135/73, pulse 98, temperature 98.6 F (37 C), temperature source Oral, resp. rate 19, height 5' 11 (1.803 m), weight 68 kg, SpO2 100%.  Medical Problem List and Plan: 1. 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  Weightbearing as tolerated             -patient may  shower-cover surgical dressing and skin tears             -ELOS/Goals: 12-14  days- supervision             - Stable to continue CIR  2.  Antithrombotics: -DVT/anticoagulation:  Pharmaceutical: Eliquis              -antiplatelet therapy: N/A 3. Pain Management: Robaxin 500 mg 3 times daily, hydrocodone 5-325 mg 1 tablet every 6 hours as needed  - Pain well-controlled on current regimen 4. Mood/Behavior/Sleep: Melatonin 3 mg nightly             -antipsychotic agents: N/A 5. Neuropsych/cognition: This patient is capable of making decisions on his own behalf. 6. Skin/Wound Care: Routine skin checks   - 10/29:  L heel and b/l 1st toe DTIs; add prevalon boots when in bed  - 10/31: WOC consult for sacral unstageable, left heel/toe, and right arm eschar.  Cleanse coccyx/buttocks wound with Vashe, do not rinse and allow to air dry. Apply 1/4 thick layer of Santyl to wound bed, top with saline moist gauze, dry gauze and silicone foam.  May lift foam daily to replace Santyl, change foam q3 days and prn soiling.  Cleanse L heel and L great toe with soap and water, dry and apply Xeroform gauze Soila [phone]) every other day to purple discoloration.  Place L foot in Prevalon boot to offload pressure.  Cleanse R arm wound with Vashe, apply Xeroform gauze to wound bed daily and secure with silicone foam or Kerlix roll gauze whichever is preferred.  Soak dressing with NS if adhered to wound bed for atraumatic removal.  7. Fluids/Electrolytes/Nutrition: Routine in and outs with follow-up chemistries  - Admission labs stable  8.  Acute blood loss anemia.  Transfused 1 unit packed red blood cells follow-up CBC  - Hemoglobin improved to 11 on admission labs; monitor  Recheck monday  9.  Hypotension.  Norvasc  5 mg daily.  HCTZ 12.5 mg daily and lisinopril  20 mg daily currently on hold and resume as needed.  Monitor with increased mobility  - stable    11/14/2023    6:37 AM 11/13/2023    7:39 PM 11/13/2023    3:31 PM  Vitals with BMI  Systolic 135 139 893  Diastolic 73 72 63  Pulse 98  83 95  -11/1-2 BP soft but stable, continue to monitor   10.  Hyperlipidemia.  Lipitor/Zetia  11.  Constipation.  MiraLAX daily prn per pt request, Senokot-S 1 tablet twice daily  - Monitor today, and may need sorbitol  - 10-30: Sincrease sennakot to 2 tabs BID  - 10-31: No BMs recorded; will resume daily MiraLAX  -11/2 LBM today  12.  Diabetes mellitus.  SSI.  Hemoglobin A1c 6.2  - Blood sugars well-controlled, monitor Recent Labs    11/13/23 2131 11/14/23 0650 11/14/23 1114  GLUCAP 146* 165* 178*    -11/1 CBGs controlled, continue to monitor  -11/2 Fair control, continue to monitor  13.  CAD/permanent pacemaker/ventricular tachycardia- Afib s/p conversion 6 weeks ago .  Continue Eliquis .  Follow-up cardiology services Dr.Crotoru  14.  AKI on CKD3A.  Cr got to  2.5- down to 1.3 Received gentle IV fluids.  Follow-up chemistries  - Admission labs with creatinine 1.3; trend  -BMP tomorrow  15. Glaucoma- con't drops 16.  Thrombocytosis.  Likely reactive, trend.  LOS: 5 days A FACE TO FACE EVALUATION WAS PERFORMED  Murray Collier 11/14/2023, 12:14 PM

## 2023-11-14 NOTE — Plan of Care (Signed)
  Problem: Consults Goal: RH GENERAL PATIENT EDUCATION Description: See Patient Education module for education specifics. Outcome: Progressing Goal: Nutrition Consult-if indicated Outcome: Progressing Goal: Diabetes Guidelines if Diabetic/Glucose > 140 Description: If diabetic or lab glucose is > 140 mg/dl - Initiate Diabetes/Hyperglycemia Guidelines & Document Interventions  Outcome: Progressing   Problem: RH BOWEL ELIMINATION Goal: RH STG MANAGE BOWEL WITH ASSISTANCE Description: STG Manage Bowel with mod I Assistance. Outcome: Progressing Goal: RH STG MANAGE BOWEL W/MEDICATION W/ASSISTANCE Description: STG Manage Bowel with Medication with mod I Assistance. Outcome: Progressing   Problem: RH SAFETY Goal: RH STG ADHERE TO SAFETY PRECAUTIONS W/ASSISTANCE/DEVICE Description: STG Adhere to Safety Precautions With cues Assistance/Device. Outcome: Progressing   Problem: RH PAIN MANAGEMENT Goal: RH STG PAIN MANAGED AT OR BELOW PT'S PAIN GOAL Description: Pain < 4 with prns Outcome: Progressing   Problem: RH KNOWLEDGE DEFICIT GENERAL Goal: RH STG INCREASE KNOWLEDGE OF SELF CARE AFTER HOSPITALIZATION Description: Patient and dtr will be able to manage care at discharge using educational resources for medications and dietary modification independently Outcome: Progressing   Problem: Education: Goal: Ability to describe self-care measures that may prevent or decrease complications (Diabetes Survival Skills Education) will improve Outcome: Progressing Goal: Individualized Educational Video(s) Outcome: Progressing   Problem: Coping: Goal: Ability to adjust to condition or change in health will improve Outcome: Progressing   Problem: Fluid Volume: Goal: Ability to maintain a balanced intake and output will improve Outcome: Progressing   Problem: Health Behavior/Discharge Planning: Goal: Ability to identify and utilize available resources and services will improve Outcome:  Progressing Goal: Ability to manage health-related needs will improve Outcome: Progressing   Problem: Metabolic: Goal: Ability to maintain appropriate glucose levels will improve Outcome: Progressing   Problem: Nutritional: Goal: Maintenance of adequate nutrition will improve Outcome: Progressing Goal: Progress toward achieving an optimal weight will improve Outcome: Progressing   Problem: Skin Integrity: Goal: Risk for impaired skin integrity will decrease Outcome: Progressing   Problem: Tissue Perfusion: Goal: Adequacy of tissue perfusion will improve Outcome: Progressing

## 2023-11-15 DIAGNOSIS — S72001A Fracture of unspecified part of neck of right femur, initial encounter for closed fracture: Secondary | ICD-10-CM | POA: Diagnosis not present

## 2023-11-15 LAB — BASIC METABOLIC PANEL WITH GFR
Anion gap: 11 (ref 5–15)
BUN: 34 mg/dL — ABNORMAL HIGH (ref 8–23)
CO2: 21 mmol/L — ABNORMAL LOW (ref 22–32)
Calcium: 9.4 mg/dL (ref 8.9–10.3)
Chloride: 103 mmol/L (ref 98–111)
Creatinine, Ser: 1.19 mg/dL (ref 0.61–1.24)
GFR, Estimated: >60 mL/min (ref >60)
Glucose, Bld: 128 mg/dL — ABNORMAL HIGH (ref 70–99)
Potassium: 3.6 mmol/L (ref 3.5–5.1)
Sodium: 135 mmol/L (ref 135–145)

## 2023-11-15 LAB — CBC
HCT: 26.1 % — ABNORMAL LOW (ref 39.0–52.0)
Hemoglobin: 9 g/dL — ABNORMAL LOW (ref 13.0–17.0)
MCH: 33.1 pg (ref 26.0–34.0)
MCHC: 34.5 g/dL (ref 30.0–36.0)
MCV: 96 fL (ref 80.0–100.0)
Platelets: 369 K/uL (ref 150–400)
RBC: 2.72 MIL/uL — ABNORMAL LOW (ref 4.22–5.81)
RDW: 14.8 % (ref 11.5–15.5)
WBC: 12.5 K/uL — ABNORMAL HIGH (ref 4.0–10.5)
nRBC: 0 % (ref 0.0–0.2)

## 2023-11-15 LAB — GLUCOSE, CAPILLARY
Glucose-Capillary: 126 mg/dL — ABNORMAL HIGH (ref 70–99)
Glucose-Capillary: 148 mg/dL — ABNORMAL HIGH (ref 70–99)
Glucose-Capillary: 114 mg/dL — ABNORMAL HIGH (ref 70–99)
Glucose-Capillary: 205 mg/dL — ABNORMAL HIGH (ref 70–99)

## 2023-11-15 LAB — HEMOGLOBIN AND HEMATOCRIT, BLOOD
HCT: 29.7 % — ABNORMAL LOW (ref 39.0–52.0)
Hemoglobin: 9.8 g/dL — ABNORMAL LOW (ref 13.0–17.0)

## 2023-11-15 MED ORDER — PANTOPRAZOLE SODIUM 40 MG PO TBEC
40.0000 mg | DELAYED_RELEASE_TABLET | Freq: Two times a day (BID) | ORAL | Status: DC
  Administered 2023-11-15 – 2023-11-21 (×12): 40 mg via ORAL
  Filled 2023-11-15 (×11): qty 1

## 2023-11-15 MED ORDER — METFORMIN HCL 500 MG PO TABS
500.0000 mg | ORAL_TABLET | Freq: Two times a day (BID) | ORAL | Status: DC
  Administered 2023-11-16 – 2023-11-21 (×11): 500 mg via ORAL
  Filled 2023-11-15 (×10): qty 1

## 2023-11-15 NOTE — Progress Notes (Signed)
 Physical Therapy Session Note  Patient Details  Name: Travis [name] MRN: [id number] Date of Birth: [birth year]  Today's Date: 11/15/2023 PT Individual Time: 9267-9184 + [id number] PT Individual Time Calculation (min): 43 min + 40 min  Short Term Goals: Week 1:  PT Short Term Goal 1 (Week 1): Pt will complete bed mobility with supervision PT Short Term Goal 2 (Week 1): Pt will complete transfers wtih CGA PT Short Term Goal 3 (Week 1): Pt will complete gait 150' with LRAD CGA  Skilled Therapeutic Interventions/Progress Updates:    SESSION 1: Pt presents in room in bed, agreeable to PT. Pt does not report pain. Session focused on therapeutic activities to facilitate independence and safety with self care tasks and transfers as well as upright tolerance, and NMR for dynamic standing balance and single limb stability. Pt completes transfers throughout session with supervision with RW. Pt ambulates with supervision to bathroom and completes toilet transfer and 3/3 toileting tasks with distant supervision. Pt completes hand hygiene with supervision in standing. Pt then ambulates from room to main gym ~250' with RW with CGA/close supervision and comes to sitting on EOM. Pt completes NMR for dynamic standing balance, power production, single limb stability and BLE muscle fiber recruitment including: - sit to stands x10 - heel raise x10 - hamstring stretch 2x30 seconds BLE - step taps 4 step x10 BLE Pt returns to room ambulating with RW with supervision and remains seated in [facility] with all needs within reach, cal light in place and chair alarm donned and activated at end of session.   SESSION 2: Pt presents in room in [facility], agreeable to PT. Pt reporting needing bandage to L heel and great toe, therapist dons bandages for skin protection at start of session. Session focused on therapeutic activities to facilitate independence and safety with functional transfers and self care tasks as well as activity  tolerance. Pt completes transfers with RW with supervision throughout session. Pt completes toilet transfers and 3/3 toileting tasks with supervision. Pt continent of bladder, charted. Pt ambulates with RW to ortho gym with supervision and comes to sitting on nustep. Pt completes continuous training on nustep x10 minutes ( with BUE/BLE, 5 min with BLE only) on L4 resistance. Pt attempts to gait train without RW however pt demonstrating increased antalgic gait and reporting soreness with RLE stance phase, ambulates 40' and comes back to sitting on EOM. Pt returns to room ambulating with RW without device, education provided on trialing rollator to use outside over uneven terrain with pt verbalizing understanding. Pt remains seated in WC with all needs within reach, cal light in place and chair alarm donned and activated at end of session.     Therapy Documentation Precautions:  Precautions Precautions: Fall Recall of Precautions/Restrictions: Impaired Precaution/Restrictions Comments: Unable to recall anterior approach precautions. Restrictions Weight Bearing Restrictions Per Provider Order: Yes RLE Weight Bearing Per Provider Order: Weight bearing as tolerated   Therapy/Group: Individual Therapy  Reche Ohara PT, DPT 11/15/2023, 8:20 AM

## 2023-11-15 NOTE — Progress Notes (Signed)
 Physical Therapy Session Note  Patient Details  Name: Travis [name] MRN: [id number] Date of Birth: 24-Feb-1940  Today's Date: 11/15/2023 PT Individual Time: 9084-9058 PT Individual Time Calculation (min): 26 min   Short Term Goals: Week 1:  PT Short Term Goal 1 (Week 1): Pt will complete bed mobility with supervision PT Short Term Goal 2 (Week 1): Pt will complete transfers wtih CGA PT Short Term Goal 3 (Week 1): Pt will complete gait 150' with LRAD CGA  Skilled Therapeutic Interventions/Progress Updates:   Received pt sitting in WC, pt agreeable to PT treatment, and reported pain 3/10 in R hip (premedicated). Session with emphasis on functional mobility/transfers, generalized strengthening and endurance, dynamic standing balance/coordination, and ambulation. Pt requesting to work on exercises during session. Pt performed all transfers with RW and close supervision throughout session. Pt performed the following standing exercises with emphasis on LE strength/ROM with BUE support on RW and close supervision for balance: -alternating marches 2x12 bilaterally -hip abduction 2x12 bilaterally -heel raises 2x12 bilaterally -mini squats 2x10 -hamstring curls 2x10 bilaterally Pt then ambulated ~12ft x 2 trials with RW and close supervision to/from ortho gym - cues for upright posture/gaze. Returned to room and concluded session with pt sitting in WC, needs within reach, and seatbelt alarm on.  Therapy Documentation Precautions:  Precautions Precautions: Fall Recall of Precautions/Restrictions: Impaired Precaution/Restrictions Comments: Unable to recall anterior approach precautions. Restrictions Weight Bearing Restrictions Per Provider Order: Yes RLE Weight Bearing Per Provider Order: Weight bearing as tolerated  Therapy/Group: Individual Therapy Therisa HERO Zaunegger Therisa Stains PT, DPT 11/15/2023, 7:02 AM

## 2023-11-15 NOTE — Progress Notes (Signed)
 Occupational Therapy Session Note  Patient Details  Name: Travis [name] MRN: [id number] Date of Birth: March 08, 1940  Today's Date: 11/15/2023 OT Individual Time: 1005-1100 OT Individual Time Calculation (min): 55 min   Today's Date: 11/15/2023 OT Individual Time: 1350-1430 OT Individual Time Calculation (min): 40 min   Short Term Goals: Week 1:  OT Short Term Goal 1 (Week 1): STGs=LTGs due to patient's estimated length of stay.  Skilled Therapeutic Interventions/Progress Updates:   Session 1: Pt greeted sitting in [facility] for skilled OT session with focus on BADL retraining and functional transfers. Pt with no reports of pain. Ambulatory walk-in shower transfer with supervision + verbal cuing for sequencing of transfer. LB dressing with Min A to unthread BLE due to fit of garments, attempted to use reacher with minimal success, plan to educate on dressing stick (as patient does not use gripping quality of reacher). Improved efficiency with threading BLE using reacher. CGA provided for standing pericare in shower, long-handled sponge utilized for BLE. Setup A for UB care. Pt remained in care of NT with 4Ps assessed and immediate needs met. Pt continues to be appropriate for skilled OT intervention to promote further functional independence in ADLs/IADLs.    Session 2: Pt greeted sitting in [facility] for skilled OT session with focus on functional transfers and discharge planning. Pt with 3/10 surgical pain, pre-medicated with rest provided as needed. Functional ambulation from room<>ADL apartment with supervision + RW, cues provided for stride length and upright posture for decreased fall risk. In ADL apartment, pt and OT review home shower setup, patient reports daughter has a walk-in shower (current DC plan is to discharge to daughter's home). Simulated transfer by bringing RW in/out of shower frame for adherence to anterior THA precautions. CGA provided initially with patient progressing to supervision.  Reviewed use of dressing stick for footwear doffing, patient would rather have daughter assist and/or wear slip on shoes without laces (declines elastic shoe laces). Pt provided with handout for Hip Kit. Pt remained sitting in WC with posey belt activated and all immediate needs met.    Therapy Documentation Precautions:  Precautions Precautions: Fall Recall of Precautions/Restrictions: Impaired Precaution/Restrictions Comments: Unable to recall anterior approach precautions. Restrictions Weight Bearing Restrictions Per Provider Order: Yes RLE Weight Bearing Per Provider Order: Weight bearing as tolerated   Therapy/Group: Individual Therapy  Nereida Habermann, OTR/L, MSOT  11/15/2023, 7:59 AM

## 2023-11-15 NOTE — Progress Notes (Signed)
 Bed switched out and patient placed on air mattress without issue.

## 2023-11-15 NOTE — Progress Notes (Addendum)
 PROGRESS NOTE   Subjective/Complaints:  No new complaints this AM.  Feels he is doing well overall, right lower extremity pain improving.  Labs significant for BUN 34, creatinine actually down to 1.19, hemoglobin down to 9.0.  Patient does endorse some dark stools recently.  Mild leukocytosis 12.5.  Patient denies any fevers, chills, feelings of illness.  ROS: Denies fevers, HA, chills, N/V, abdominal pain, constipation, diarrhea, SOB, cough, chest pain, new weakness or paraesthesias, mood change.   Dark stools  Objective:   No results found.  Recent Labs    11/15/23 0515  WBC 12.5*  HGB 9.0*  HCT 26.1*  PLT 369    Recent Labs    11/15/23 0515  NA 135  K 3.6  CL 103  CO2 21*  GLUCOSE 128*  BUN 34*  CREATININE 1.19  CALCIUM  9.4     Intake/Output Summary (Last 24 hours) at 11/15/2023 9191 Last data filed at 11/15/2023 0630 Gross per 24 hour  Intake 413 ml  Output 400 ml  Net 13 ml     Wound 11/09/23 1600 Pressure Injury Coccyx Mid Unstageable - Full thickness tissue loss in which the base of the injury is covered by slough (yellow, tan, gray, green or brown) and/or eschar (tan, brown or black) in the wound bed. (Active)     Wound 11/09/23 1600 Pressure Injury Heel Left Deep Tissue Pressure Injury - Purple or maroon localized area of discolored intact skin or blood-filled blister due to damage of underlying soft tissue from pressure and/or shear. (Active)     Wound 11/09/23 1600 Pressure Injury Toe (Comment  which one) Anterior;Right Deep Tissue Pressure Injury - Purple or maroon localized area of discolored intact skin or blood-filled blister due to damage of underlying soft tissue from pressure and/or shear (Active)    Physical Exam: Vital Signs Blood pressure (!) 113/59, pulse 91, temperature 98.1 F (36.7 C), resp. rate 16, height 5' 11 (1.803 m), weight 68 kg, SpO2 96%.  Constitutional: No apparent  distress. Appropriate appearance for age.  Sitting up in bedside chair.  Head on pillow on his tray table-reports he is comfortable this way HENT: No JVD. Neck Supple. Trachea midline. Atraumatic, normocephalic. Eyes: PERRLA. Cardiovascular: RRR, no murmurs/rub/gallops. No further R hip Edema. Peripheral pulses 2+  Respiratory: CTAB. No rales, rhonchi, or wheezing. On RA.  Abdomen: + bowel sounds, normoactive. No distention or tenderness.  Skin: C/D/I. No apparent lesions.   + R hip Surgical site with minimal drainage--stable + R elbow skin tear - c/d/I.  + blancheable erythema bilateral 1st toes and heels--covered in mepilex  MSK:      No apparent deformity. + R hip ROM deficit d/t pain  Neurologic exam:  Cognition: AAO to person, place, time and event.   Insight: Good  insight into current condition.  Mood: Pleasant affect, appropriate mood.   Strength: UEs 5-/5 throughout LLE 5-/5 throughout RLE-4/5 in HF, KE, KF, but 5-/5 distally   Assessment/Plan: 1. Functional deficits which require 3+ hours per day of interdisciplinary therapy in a comprehensive inpatient rehab setting. Physiatrist is providing close team supervision and 24 hour management of active medical problems listed below. Physiatrist and  rehab team continue to assess barriers to discharge/monitor patient progress toward functional and medical goals  Care Tool:  Bathing    Body parts bathed by patient: Right arm, Left arm, Chest, Abdomen, Front perineal area, Buttocks, Right upper leg, Left upper leg, Face   Body parts bathed by helper: Right lower leg, Left lower leg     Bathing assist Assist Level: Minimal Assistance - Patient > 75%     Upper Body Dressing/Undressing Upper body dressing   What is the patient wearing?: Pull over shirt    Upper body assist Assist Level: Set up assist    Lower Body Dressing/Undressing Lower body dressing      What is the patient wearing?: Underwear/pull up, Pants      Lower body assist Assist for lower body dressing: Maximal Assistance - Patient 25 - 49%     Toileting Toileting    Toileting assist Assist for toileting: Minimal Assistance - Patient > 75%     Transfers Chair/bed transfer  Transfers assist     Chair/bed transfer assist level: Contact Guard/Touching assist     Locomotion Ambulation   Ambulation assist      Assist level: Contact Guard/Touching assist Assistive device: Walker-rolling Max distance: 100'   Walk 10 feet activity   Assist     Assist level: Contact Guard/Touching assist Assistive device: Walker-rolling   Walk 50 feet activity   Assist    Assist level: Contact Guard/Touching assist Assistive device: Walker-rolling    Walk 150 feet activity   Assist Walk 150 feet activity did not occur: Safety/medical concerns (nausea with 100' walk)         Walk 10 feet on uneven surface  activity   Assist Walk 10 feet on uneven surfaces activity did not occur: Safety/medical concerns         Wheelchair     Assist Is the patient using a wheelchair?: Yes Type of Wheelchair: Manual    Wheelchair assist level: Dependent - Patient 0%      Wheelchair 50 feet with 2 turns activity    Assist        Assist Level: Dependent - Patient 0%   Wheelchair 150 feet activity     Assist      Assist Level: Dependent - Patient 0%   Blood pressure (!) 113/59, pulse 91, temperature 98.1 F (36.7 C), resp. rate 16, height 5' 11 (1.803 m), weight 68 kg, SpO2 96%.  Medical Problem List and Plan: 1. 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  Weightbearing as tolerated             -patient may  shower-cover surgical dressing and skin tears             -ELOS/Goals: 12-14 days- supervision             - Stable to continue CIR  2.  Antithrombotics: -DVT/anticoagulation:  Pharmaceutical: Eliquis               -antiplatelet therapy: N/A 3. Pain Management: Robaxin 500 mg 3 times daily, hydrocodone 5-325 mg 1 tablet every 6 hours as needed  - Pain well-controlled on current regimen 4. Mood/Behavior/Sleep: Melatonin 3 mg nightly             -antipsychotic agents: N/A 5. Neuropsych/cognition: This patient is capable of making decisions on his own behalf. 6. Skin/Wound Care: Routine skin checks   - 10/29: L heel and b/l  1st toe DTIs; add prevalon boots when in bed  - 10/31: WOC consult for sacral unstageable, left heel/toe, and right arm eschar.  Cleanse coccyx/buttocks wound with Vashe, do not rinse and allow to air dry. Apply 1/4 thick layer of Santyl to wound bed, top with saline moist gauze, dry gauze and silicone foam.  May lift foam daily to replace Santyl, change foam q3 days and prn soiling.  Cleanse L heel and L great toe with soap and water, dry and apply Xeroform gauze Soila [phone]) every other day to purple discoloration.  Place L foot in Prevalon boot to offload pressure.  Cleanse R arm wound with Vashe, apply Xeroform gauze to wound bed daily and secure with silicone foam or Kerlix roll gauze whichever is preferred.  Soak dressing with NS if adhered to wound bed for atraumatic removal.  11-3: Has been 2 weeks postop, will message Dr. Ernie regarding removal of dressing.  7. Fluids/Electrolytes/Nutrition: Routine in and outs with follow-up chemistries  - Admission labs stable  8.  Acute blood loss anemia.  Transfused 1 unit packed red blood cells follow-up CBC  - Hemoglobin improved to 11 on admission labs; monitor  -11-3: Hemoglobin down to 9.0.  Elevated BUN indicating possible GI etiology.  Get FOBT, repeat H&H to confirm.  Adding Protonix 40 mg twice daily.--Repeat stable, 9.8.  9.  Hypotension.  Norvasc  5 mg daily.  HCTZ 12.5 mg daily and lisinopril  20 mg daily currently on hold and resume as needed.  Monitor with increased mobility  - stable    11/15/2023    4:33 AM  11/14/2023    7:27 PM 11/14/2023    2:33 PM  Vitals with BMI  Systolic 113 112 881  Diastolic 59 49 61  Pulse 91 85 66  -11/1-2 BP soft but stable, continue to monitor   10.  Hyperlipidemia.  Lipitor/Zetia  11.  Constipation.  MiraLAX daily prn per pt request, Senokot-S 1 tablet twice daily  - Monitor today, and may need sorbitol  - 10-30: Sincrease sennakot to 2 tabs BID  - 10-31: No BMs recorded; will resume daily MiraLAX  -11/2 LBM today  12.  Diabetes mellitus.  SSI.  Hemoglobin A1c 6.2  - Blood sugars well-controlled, monitor Recent Labs    11/14/23 1636 11/14/23 2052 11/15/23 0533  GLUCAP 98 299* 126*    -11/1 CBGs controlled, continue to monitor  -11/2 Fair control, continue to monitor  - 11-3: Consistently high blood sugars at nighttime.  However, cautious to add nightly insulin given a.m. lows.  Hemoglobin A1c is at goal for age, resume home metformin  500 mg twice daily.  13.  CAD/permanent pacemaker/ventricular tachycardia- Afib s/p conversion 6 weeks ago .  Continue Eliquis .  Follow-up cardiology services Dr.Crotoru  - 11-3: Creatinine now in range to resume Eliquis  at 5 mg twice daily dosing.  Will hold off on this until FOBT back  14.  AKI on CKD3A.  Cr got to  2.5- down to 1.3 Received gentle IV fluids.  Follow-up chemistries  - Admission labs with creatinine 1.3; trend  -BMP tomorrow--creatinine improved, BUN uptrending as above. Repeat in AM,   15. Glaucoma- con't drops  16.  Thrombocytosis.  Likely reactive, trend.  - Resolved  LOS: 6 days A FACE TO FACE EVALUATION WAS PERFORMED  Joesph JAYSON Likes 11/15/2023, 8:08 AM

## 2023-11-16 DIAGNOSIS — S72001S Fracture of unspecified part of neck of right femur, sequela: Secondary | ICD-10-CM | POA: Diagnosis not present

## 2023-11-16 DIAGNOSIS — R4189 Other symptoms and signs involving cognitive functions and awareness: Secondary | ICD-10-CM | POA: Diagnosis not present

## 2023-11-16 DIAGNOSIS — S72001A Fracture of unspecified part of neck of right femur, initial encounter for closed fracture: Secondary | ICD-10-CM | POA: Diagnosis not present

## 2023-11-16 LAB — GLUCOSE, CAPILLARY
Glucose-Capillary: 129 mg/dL — ABNORMAL HIGH (ref 70–99)
Glucose-Capillary: 108 mg/dL — ABNORMAL HIGH (ref 70–99)
Glucose-Capillary: 180 mg/dL — ABNORMAL HIGH (ref 70–99)

## 2023-11-16 LAB — BASIC METABOLIC PANEL WITH GFR
Anion gap: 8 (ref 5–15)
BUN: 33 mg/dL — ABNORMAL HIGH (ref 8–23)
CO2: 21 mmol/L — ABNORMAL LOW (ref 22–32)
Calcium: 9.1 mg/dL (ref 8.9–10.3)
Chloride: 105 mmol/L (ref 98–111)
Creatinine, Ser: 1.23 mg/dL (ref 0.61–1.24)
GFR, Estimated: 58 mL/min — ABNORMAL LOW (ref >60)
Glucose, Bld: 123 mg/dL — ABNORMAL HIGH (ref 70–99)
Potassium: 3.9 mmol/L (ref 3.5–5.1)
Sodium: 134 mmol/L — ABNORMAL LOW (ref 135–145)

## 2023-11-16 LAB — HEMOGLOBIN AND HEMATOCRIT, BLOOD
HCT: 25.9 % — ABNORMAL LOW (ref 39.0–52.0)
Hemoglobin: 8.9 g/dL — ABNORMAL LOW (ref 13.0–17.0)

## 2023-11-16 MED ORDER — SENNOSIDES-DOCUSATE SODIUM 8.6-50 MG PO TABS: 2.0000 | ORAL_TABLET | Freq: Two times a day (BID) | ORAL | Status: DC

## 2023-11-16 MED ORDER — POLYETHYLENE GLYCOL 3350 17 G PO PACK: 17.0000 g | PACK | Freq: Every day | ORAL | Status: DC | PRN

## 2023-11-16 MED ORDER — ACETAMINOPHEN 325 MG PO TABS: 650.0000 mg | ORAL_TABLET | Freq: Four times a day (QID) | ORAL | Status: AC | PRN

## 2023-11-16 NOTE — Progress Notes (Signed)
 Patient ID: Travis [name], male   DOB: April 08, 1940, 83 y.o.   MRN: [id number]   SW went by pt room to provide updates from team conference but pt not in room.   1411- SW received phone call from pt dtr [name] concerned about pt discharge date stating that she will be out of town next week since initially this was  practicing for his discharge to her home. She requests an  additional week stating that they are still preparing his home in which he has stairs to get into his home, his bedroom is upstairs, and not prepared to get his bedroom downstairs yet. States her sister will be able to stay with him at this home, however, it he needs a ramp this will be an issue. SW informed will take concerns to medical team, however, will see if an extension will be granted but not likely due to his current ambulation.   SW informed medical team. Medical team shares he does not need a ramp but can do steps with handrail, if no handrail will require assistance.   1517- SW returned phone call to pt dtr [name] to inform a week extension was not supported by medical team. SW discussed family edu this week. Will come in on Thursday 2pm-4pm and her sister as well. She shares there is an issue with the concrete stairs that need to be repaired. SW shared for her to take a picture and bring to family edu and discuss with therapists. SW will follow-up about final d/c recs.   Graeme Jude, MSW, LCSW Office: [phone] Cell: [phone] Fax: [phone]

## 2023-11-16 NOTE — Progress Notes (Signed)
 Occupational Therapy Session Note  Patient Details  Name: Travis [name] MRN: [id number] Date of Birth: [birth year]  Today's Date: 11/16/2023 OT Individual Time: 1500-1530 OT Individual Time Calculation (min): 30 min    Short Term Goals: Week 1:  OT Short Term Goal 1 (Week 1): STGs=LTGs due to patient's estimated length of stay.  Skilled Therapeutic Interventions/Progress Updates:      Therapy Documentation Precautions:  Precautions Precautions: Fall Recall of Precautions/Restrictions: Impaired Precaution/Restrictions Comments: Unable to recall anterior approach precautions. Restrictions Weight Bearing Restrictions Per Provider Order: (P) Yes RLE Weight Bearing Per Provider Order: (P) Weight bearing as tolerated General: Pt seated in W/C upon OT arrival, agreeable to OT.  Pain: no pain reported  Exercises: Pt completed 10 minutes of UE ergometer in order to increase BUE/BLEstrength and endurance in preparation for increased independence in ADLs and functional mobility. Brief UE stretching rest break after 5 min, on hills mode resistance ranging from 4-9. Pt keeping consistent pacing throughout activity with no SOB.  Other Treatments: OT and pt discussing D/C plan and how his daughter will be with him in his house. Pt feels comfortable with navigating stairs with more therapy to still complete before D/C. OT noting good safety awareness from pt throughout session.    Pt seated in W/C at end of session with W/C alarm donned, call light within reach and 4Ps assessed.    Therapy/Group: Individual Therapy  Camie Hoe, OTD, OTR/L 11/16/2023, 5:57 PM

## 2023-11-16 NOTE — Consult Note (Signed)
 Neuropsychological Consultation Comprehensive Inpatient Rehab   Patient:   Travis [name]   DOB:   07-Jan-1941  MR Number:  [id number]  Location:  [facility] [address] [place] [address] Dept: [phone] Loc: [phone]           Date of Service:   11/16/2023  Start Time:   2 PM End Time:   3 PM  Provider/Observer:  Norleen Asa, Psy.D.       Clinical Neuropsychologist       Billing Code/Service: 09208  REASON FOR CONSULT: Travis [name] is an 83 year old male referred for neuropsychological consultation during his ongoing admission to the comprehensive rehabilitation following complications from a right total hip arthroplasty.  HISTORY OF PRESENT ILLNESS: Travis [name] is an 83 year old right-handed male with a complex medical history, admitted to [facility] on 11/04/2023 for hypotension. This occurred four days after a right total hip arthroplasty (THA) on 10/31/2023. Post-operatively, he developed an infection and pneumonia/atelectasis. Hospital course was complicated by acute kidney injury (creatinine 2.5 vs baseline 1.2) and anemia (hemoglobin 7.8), requiring one unit of packed red blood cells. He has since been stabilized and admitted for a comprehensive rehabilitation program with a weight-bearing as tolerated status for the right lower extremity.  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 Francyne. Chronic anticoagulation with Eliquis . Hypertension (HTN). Hyperlipidemia. Type 2 diabetes mellitus (T2DM). Ventricular tachycardia. History of kidney stones. Past Achilles tendon injury.  COGNITIVE/BEHAVIORAL STATUS: Travis [name] was seen for a brief bedside consultation. He was initially asleep then drowsy but became alert and oriented. He reports a subjective decline in memory over the past five years, making it  difficult to detect any acute changes since his recent illness. He expressed concern about potential cognitive injury from his recent illness. CT scan results were explained as showing normal age-related changes. He was reassured that acute illness, such as a significant infection, commonly worsens cognition temporarily and that he is expected to return to his cognitive baseline given his pre-morbid physical activity level and the prompt medical management of his infection.  ASSESSMENT: Travis [name] is an 83 year old male recovering from post-operative complications including infection and atelectasis, which have temporarily impacted his cognitive and physical functioning. He is currently in a state of deconditioning but is showing signs of physical and cognitive improvement. He is engaged in his therapies and motivated for recovery. His cognitive complaints appear consistent with the effects of his acute medical illness superimposed on a pre-existing subjective memory decline. Prognosis for return to his functional baseline is good, though recovery will likely be protracted.  PLAN/RECOMMENDATIONS: 1. Continue with the current comprehensive rehabilitation program (PT/OT). 2. Provided psychoeducation regarding the impact of acute medical illness on cognition and the expected course of recovery. Emphasized that cognition should improve as his physical health recovers. 3. Reinforced safety precautions, particularly fall prevention (call, don't fall). Explained the purpose of the reminder belt. 4. Encouraged continued engagement in therapies and graded increase in activity upon discharge. 5. Recommended specific home exercise: controlled chair-to-stand squats using a firm chair facing a couch to rebuild lower extremity and core strength, which is crucial for fall prevention. Advised a do as much as you can, as long as it's safe, without over-exerting approach to build endurance. 6. Advised to continue asking  questions and to follow up with his physiatrist, Dr. Emeline, post-discharge for ongoing rehabilitation management. 7.  No further neuropsychology follow-up is indicated at this time unless new cognitive or behavioral concerns arise.   Electronically Signed   _______________________ Norleen Asa, Psy.D. Clinical Neuropsychologist

## 2023-11-16 NOTE — Patient Care Conference (Signed)
 Inpatient RehabilitationTeam Conference and Plan of Care Update Date: 11/16/2023   Time: 1053 am    Patient Name: Travis [name]      Medical Record Number: [id number]  Date of Birth: [birth year] Sex: Male         Room/Bed: 4M11C/4M11C-01 Payor Info: Payor: MEDICARE / Plan: MEDICARE PART A AND B / Product Type: *No Product type* /    Admit Date/Time:  11/09/2023  2:18 PM  Primary Diagnosis:  Fracture of femoral neck, right Travis [name])  Hospital Problems: Principal Problem:   Fracture of femoral neck, right (HCC) Active Problems:   H/O total hip arthroplasty, right   Debility   Malnutrition of moderate degree    Expected Discharge Date: Expected Discharge Date: 11/21/23  Team Members Present: Physician leading conference: Dr. Joesph Likes Social Worker Present: Graeme Jude, LCSW Nurse Present: Eulalio Falls, RN PT Present: Elam Ohara, PT OT Present: Nereida Habermann, OT SLP Present: Recardo Mole, SLP PPS Coordinator present : Eleanor Colon, SLP     Current Status/Progress Goal Weekly Team Focus  Bowel/Bladder      Continent of bowel and bladder    Maintain continence of bowel and bladder    Assess bowel and bladder q shift  Swallow/Nutrition/ Hydration               ADL's   Setup A for UB ADLs & Min A for LB ADLs due to RLE weakness/stiffness. Barriers: THA precations and RLE weakness/pain.   Mod I   Continues AE training, standing balance/tolerance, general conditioning, and discharge planning    Mobility   supervision overall, up to 250' with RW/rollator   supervision/modI  barriers: slight pain in L hip; focus on transfers, endurance, NMR for standing balance    Communication                Safety/Cognition/ Behavioral Observations               Pain      No complaints of pain    <4 w/ prns    <4 w/ prns  Skin   Left heel DTI Rt toe DTI Coccyx unstageable Incision  Prevent further skin breakdown  Q2h turns, Foam dressing, Wound  care q shift      Discharge Planning:  Pt wil discharge to home with PRN support from his daughter if he goes home, and has the optionto go to his daughter's home but more intermittent support as she has other obligations. SW will confirm there are no barriers to discharge.    Team Discussion:  Patient was admitted post debility due to right femoral hip fracture. Patient with hypotension/ anemia : medication and treatment adjusted by MD. Patient progress limited by activity tolerance, right lower extremity weakness and stiffness.   Patient on target to meet rehab goals: Currently patient needs set up assist with upper body and minimal assistance with lower body care. Patient needs Patient needs supervision with transfers and mobility. Patient needs cueing to recall. Overall goals at discharge are set for supervision- mod I assistance.   *See Care Plan and progress notes for long and short-term goals.   Revisions to Treatment Plan:  Adaptive equipment WOC consult Ortho consult - dressing on incision Wound care plan activated FOBT  Teaching Needs: Safety, medications, transfers , wound care, incision care, etc   Current Barriers to Discharge: Decreased caregiver support, Home enviroment access/layout, and Wound care  Possible Resolutions to Barriers: Family Education Out patoent follow up DME: [facility]  Medical Summary Current Status: medically complicated by acute on chronic anemia, right hip pain, aki, diabetes, Hx afib on eliquis   Barriers to Discharge: Medical stability;Renal Insufficiency/Failure;Self-care education;Uncontrolled Pain;Uncontrolled Diabetes;Symptomatic Anemia   Possible Resolutions to [employer]: titrate pain medications, FOBT pending for worsening anemia, monitor vitals   Continued Need for Acute Rehabilitation Level of Care: The patient requires daily medical management by a physician with specialized training in physical medicine and  rehabilitation for the following reasons: Direction of a multidisciplinary physical rehabilitation program to maximize functional independence : Yes Medical management of patient stability for increased activity during participation in an intensive rehabilitation regime.: Yes Analysis of laboratory values and/or radiology reports with any subsequent need for medication adjustment and/or medical intervention. : Yes   I attest that I was present, lead the team conference, and concur with the assessment and plan of the team.   [name] 11/16/2023, 1053 am

## 2023-11-16 NOTE — Progress Notes (Signed)
 Occupational Therapy Session Note  Patient Details  Name: Travis [name] MRN: [id number] Date of Birth: Aug 22, 1940   Short Term Goals: Week 1:  OT Short Term Goal 1 (Week 1): STGs=LTGs due to patient's estimated length of stay.  Skilled Therapeutic Interventions/Progress Updates:  Occupational Therapist participated in the interdisciplinary team conference, providing clinical information regarding the patient's current status, treatment goals, and weekly focus, including any barriers that need to be addressed. Please see the Inpatient Rehabilitation Team Conference and Plan of Care Update for further details.  Therapy Documentation Precautions:  Precautions Precautions: Fall Recall of Precautions/Restrictions: Impaired Precaution/Restrictions Comments: Unable to recall anterior approach precautions. Restrictions Weight Bearing Restrictions Per Provider Order: (P) Yes RLE Weight Bearing Per Provider Order: (P) Weight bearing as tolerated   Therapy/Group: Individual Therapy  Nereida Habermann, OTR/L, MSOT  11/16/2023, 3:58 PM

## 2023-11-16 NOTE — Progress Notes (Signed)
 PROGRESS NOTE   Subjective/Complaints:  No new complaints this AM.  Vital stable.  No complaints, concerns.  Feels he is progressing well. Does note some increased swelling in his lower extremities. Continent of bowel bladder, had a small bowel movement 11-3.  BUN, creatinine stable today.  Hemoglobin stable.  ROS: Denies fevers, HA, chills, N/V, abdominal pain, constipation, diarrhea, SOB, cough, chest pain, new weakness or paraesthesias, mood change.   Dark stools  Objective:   No results found.  Recent Labs    11/15/23 0515 11/15/23 0829 11/16/23 0450  WBC 12.5*  --   --   HGB 9.0* 9.8* 8.9*  HCT 26.1* 29.7* 25.9*  PLT 369  --   --     Recent Labs    11/15/23 0515 11/16/23 0450  NA 135 134*  K 3.6 3.9  CL 103 105  CO2 21* 21*  GLUCOSE 128* 123*  BUN 34* 33*  CREATININE 1.19 1.23  CALCIUM  9.4 9.1     Intake/Output Summary (Last 24 hours) at 11/16/2023 9093 Last data filed at 11/16/2023 0434 Gross per 24 hour  Intake --  Output 325 ml  Net -325 ml     Wound 11/09/23 1600 Pressure Injury Coccyx Mid Unstageable - Full thickness tissue loss in which the base of the injury is covered by slough (yellow, tan, gray, green or brown) and/or eschar (tan, brown or black) in the wound bed. (Active)     Wound 11/09/23 1600 Pressure Injury Heel Left Deep Tissue Pressure Injury - Purple or maroon localized area of discolored intact skin or blood-filled blister due to damage of underlying soft tissue from pressure and/or shear. (Active)     Wound 11/09/23 1600 Pressure Injury Toe (Comment  which one) Anterior;Right Deep Tissue Pressure Injury - Purple or maroon localized area of discolored intact skin or blood-filled blister due to damage of underlying soft tissue from pressure and/or shear (Active)    Physical Exam: Vital Signs Blood pressure (!) 117/55, pulse 81, temperature 98 F (36.7 C), resp. rate 16, height  5' 11 (1.803 m), weight 68 kg, SpO2 97%.  Constitutional: No apparent distress. Appropriate appearance for age.  Sitting up in bedside chair.  Head on pillow on his tray table-reports he is comfortable this way HENT: No JVD. Neck Supple. Trachea midline. Atraumatic, normocephalic. Eyes: PERRLA. Cardiovascular: RRR, no murmurs/rub/gallops.  1+ bilateral peripheral edema. Respiratory: CTAB. No rales, rhonchi, or wheezing. On RA.  Abdomen: + bowel sounds, normoactive. No distention or tenderness.  Skin: C/D/I.  + R hip Surgical site with minimal drainage--stable + R elbow skin tear - c/d/I.  + blancheable erythema bilateral 1st toes and heels--covered in mepilex  MSK:      No apparent deformity. + R hip ROM deficit d/t pain  Neurologic exam:  Cognition: AAO to person, place, time and event.   Insight: Good  insight into current condition.  Mood: Pleasant affect, appropriate mood.   Strength: UEs 5-/5 throughout LLE 5-/5 throughout RLE-4/5 in HF, 5-/5KE, KF, 5/5 distally   Assessment/Plan: 1. Functional deficits which require 3+ hours per day of interdisciplinary therapy in a comprehensive inpatient rehab setting. Physiatrist is providing close team supervision  and 24 hour management of active medical problems listed below. Physiatrist and rehab team continue to assess barriers to discharge/monitor patient progress toward functional and medical goals  Care Tool:  Bathing    Body parts bathed by patient: Right arm, Left arm, Chest, Abdomen, Front perineal area, Buttocks, Right upper leg, Left upper leg, Face   Body parts bathed by helper: Right lower leg, Left lower leg     Bathing assist Assist Level: Minimal Assistance - Patient > 75%     Upper Body Dressing/Undressing Upper body dressing   What is the patient wearing?: Pull over shirt    Upper body assist Assist Level: Set up assist    Lower Body Dressing/Undressing Lower body dressing      What is the patient  wearing?: Underwear/pull up, Pants     Lower body assist Assist for lower body dressing: Maximal Assistance - Patient 25 - 49%     Toileting Toileting    Toileting assist Assist for toileting: Minimal Assistance - Patient > 75%     Transfers Chair/bed transfer  Transfers assist     Chair/bed transfer assist level: Contact Guard/Touching assist     Locomotion Ambulation   Ambulation assist      Assist level: Contact Guard/Touching assist Assistive device: Walker-rolling Max distance: 100'   Walk 10 feet activity   Assist     Assist level: Contact Guard/Touching assist Assistive device: Walker-rolling   Walk 50 feet activity   Assist    Assist level: Contact Guard/Touching assist Assistive device: Walker-rolling    Walk 150 feet activity   Assist Walk 150 feet activity did not occur: Safety/medical concerns (nausea with 100' walk)         Walk 10 feet on uneven surface  activity   Assist Walk 10 feet on uneven surfaces activity did not occur: Safety/medical concerns         Wheelchair     Assist Is the patient using a wheelchair?: Yes Type of Wheelchair: Manual    Wheelchair assist level: Dependent - Patient 0%      Wheelchair 50 feet with 2 turns activity    Assist        Assist Level: Dependent - Patient 0%   Wheelchair 150 feet activity     Assist      Assist Level: Dependent - Patient 0%   Blood pressure (!) 117/55, pulse 81, temperature 98 F (36.7 C), resp. rate 16, height 5' 11 (1.803 m), weight 68 kg, SpO2 97%.  Medical Problem List and Plan: 1. 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  Weightbearing as tolerated             -patient may  shower-cover surgical dressing and skin tears             -ELOS/Goals: 12-14 days- supervision - 11/9 DC             - Stable to continue CIR   - 11/4: Lives alone,  needs to be IND for home because PRN support. SPV PT up to 250 ft with walker, Setup UP, Min A LB due to hip stiffness when getting pants on. Needs occassional cueing for recall. Will need BSC.   2.  Antithrombotics: -DVT/anticoagulation:  Pharmaceutical: Eliquis              -antiplatelet therapy: N/A 3. Pain Management: Robaxin 500 mg 3 times daily, hydrocodone 5-325 mg 1 tablet  every 6 hours as needed  - Pain well-controlled on current regimen 4. Mood/Behavior/Sleep: Melatonin 3 mg nightly             -antipsychotic agents: N/A 5. Neuropsych/cognition: This patient is capable of making decisions on his own behalf. 6. Skin/Wound Care: Routine skin checks   - 10/29: L heel and b/l 1st toe DTIs; add prevalon boots when in bed  - 10/31: WOC consult for sacral unstageable, left heel/toe, and right arm eschar.  Cleanse coccyx/buttocks wound with Vashe, do not rinse and allow to air dry. Apply 1/4 thick layer of Santyl to wound bed, top with saline moist gauze, dry gauze and silicone foam.  May lift foam daily to replace Santyl, change foam q3 days and prn soiling.  Cleanse L heel and L great toe with soap and water, dry and apply Xeroform gauze Soila [phone]) every other day to purple discoloration.  Place L foot in Prevalon boot to offload pressure.  Cleanse R arm wound with Vashe, apply Xeroform gauze to wound bed daily and secure with silicone foam or Kerlix roll gauze whichever is preferred.  Soak dressing with NS if adhered to wound bed for atraumatic removal.  11-3: Has been 2 weeks postop, will message Dr. Ernie regarding removal of dressing--cleared by Rosina Calin, PA to remove dressing 11-4, order placed  7. Fluids/Electrolytes/Nutrition: Routine in and outs with follow-up chemistries  - Admission labs stable  8.  Acute blood loss anemia.  Transfused 1 unit packed red blood cells follow-up CBC  - Hemoglobin improved to 11 on admission labs; monitor  -11-3: Hemoglobin down to 9.0.   Elevated BUN indicating possible GI etiology.  Get FOBT, repeat H&H to confirm.  Adding Protonix 40 mg twice daily.--Repeat stable, 9.8.  9.  Hypotension.  Norvasc  5 mg daily.  HCTZ 12.5 mg daily and lisinopril  20 mg daily currently on hold and resume as needed.  Monitor with increased mobility  - 11-4: Some peripheral edema, BP soft.  DC Norvasc .    11/16/2023    7:52 AM 11/16/2023    4:33 AM 11/15/2023    8:17 PM  Vitals with BMI  Systolic 117 116 873  Diastolic 55 52 60  Pulse  81 80  -11/1-2 BP soft but stable, continue to monitor   10.  Hyperlipidemia.  Lipitor/Zetia  11.  Constipation.  MiraLAX daily prn per pt request, Senokot-S 1 tablet twice daily  - Monitor today, and may need sorbitol  - 10-30: Sincrease sennakot to 2 tabs BID  - 10-31: No BMs recorded; will resume daily MiraLAX  -11/3 LBM  --small  12.  Diabetes mellitus.  SSI.  Hemoglobin A1c 6.2  - Blood sugars well-controlled, monitor Recent Labs    11/16/23 0539 11/16/23 1135 11/16/23 1550  GLUCAP 129* 108* 180*  - 11/1 CBGs controlled, continue to monitor - 11/2 Fair control, continue to monitor  - 11-3: Consistently high blood sugars at nighttime.  However, cautious to add nightly insulin given a.m. lows.  Hemoglobin A1c is at goal for age, resume home metformin  500 mg twice daily. 11-4: Creatinine tolerating metformin , blood sugars improved.  Monitor on current regimen.  13.  CAD/permanent pacemaker/ventricular tachycardia- Afib s/p conversion 6 weeks ago .  Continue Eliquis .  Follow-up cardiology services Dr.Crotoru  - 11-3: Creatinine now in range to resume Eliquis  at 5 mg twice daily dosing.  Will hold off on this until FOBT back  14.  AKI on CKD3A.  Cr got to  2.5- down  to 1.3 Received gentle IV fluids.  Follow-up chemistries  - Admission labs with creatinine 1.3; trend  -BMP tomorrow--creatinine improved, BUN uptrending as above. Repeat in AM,   15. Glaucoma- con't drops  16.  Thrombocytosis.  Likely  reactive, trend.  - Resolved  LOS: 7 days A FACE TO FACE EVALUATION WAS PERFORMED  Travis [name] 11/16/2023, 9:06 AM

## 2023-11-16 NOTE — Progress Notes (Signed)
 Physical Therapy Note  Patient Details  Name: ELMA LIMAS MRN: [id number] Date of Birth: 1940-03-04 Today's Date: 11/16/2023    Physical Therapist participated in the interdisciplinary team conference, providing clinical information regarding the patient's current status, treatment goals, and weekly focus, including any barriers that need to be addressed. Please see the Inpatient Rehabilitation Team Conference and Plan of Care Update for further details.    Reche Ohara PT, DPT 11/16/2023, 4:56 PM

## 2023-11-16 NOTE — Progress Notes (Signed)
 Occupational Therapy Session Note  Patient Details  Name: Travis [name] MRN: [id number] Date of Birth: January 26, 1940  Today's Date: 11/16/2023 OT Individual Time: 9054-8957 OT Individual Time Calculation (min): 57 min    Short Term Goals: Week 1:  OT Short Term Goal 1 (Week 1): STGs=LTGs due to patient's estimated length of stay.   Skilled Therapeutic Interventions/Progress Updates:    Pt up in wheelchair at time of session, no pain reported. Pt pleasant and motivated throughout session. Focused initially on AE training with reacher for simulated LB dressing using gait belt as pt already dressed. Pt performing with Supervision after OT simulating several times - demonstrated where to buy on [employer] or medical supply store. Walking in room with RW chair > sink > bathroom all with Supervision, able to stand at sink and perform oral hygiene/grooming supervision. 3/3 toileting tasks with Supervision as well. Ambulated with rollator with education on locking/unlocking brakes and using seat for recovery breaks room <> gym. Standing BUE strengthening with 5# dowel for chest press, overhead press, FWD and backward circles all for 1x15 in standing and cues for form. Standing balance activity with single UE support for taps on colors from 1 up to 3 sequence alternating L and R LE. Ambualted back to room Supervision with rollator, set up alarm on call bell in reach.    Therapy Documentation Precautions:  Precautions Precautions: Fall Recall of Precautions/Restrictions: Impaired Precaution/Restrictions Comments: Unable to recall anterior approach precautions. Restrictions Weight Bearing Restrictions Per Provider Order: Yes RLE Weight Bearing Per Provider Order: Weight bearing as tolerated    Therapy/Group: Individual Therapy  Chiquita JAYSON Hopping 11/16/2023, 7:25 AM

## 2023-11-16 NOTE — Progress Notes (Signed)
 Occupational Therapy Session Note  Patient Details  Name: Travis [name] MRN: [id number] Date of Birth: 17-May-1940  Today's Date: 11/16/2023 OT Individual Time: 8694-8654 OT Individual Time Calculation (min): 40 min    Short Term Goals: Week 1:  OT Short Term Goal 1 (Week 1): STGs=LTGs due to patient's estimated length of stay.  Skilled Therapeutic Interventions/Progress Updates:  Pt greeted sitting in WC, reports of 3/10 surgical pain. Ambulatory transfers at [facility] + rollator, moderate cuing for safe brake management. In main therapy gym, pt simulates walk-in shower transfer, stepping laterally into shower and forward out of shower, increased complexity for precaution adherence. Transfer completed with supervision + unilateral support. Re-enforced need for grab bar. Session transitioned to reacher training, progressing to supervision for threading task. Pt remained sitting in WC with 4Ps assessed and immediate needs met. Pt continues to be appropriate for skilled OT intervention to promote further functional independence in ADLs/IADLs.   Therapy Documentation Precautions:  Precautions Precautions: Fall Recall of Precautions/Restrictions: Impaired Precaution/Restrictions Comments: Unable to recall anterior approach precautions. Restrictions Weight Bearing Restrictions Per Provider Order: Yes RLE Weight Bearing Per Provider Order: Weight bearing as tolerated   Therapy/Group: Individual Therapy  Nereida Habermann, OTR/L, MSOT  11/16/2023, 7:43 AM

## 2023-11-16 NOTE — Progress Notes (Signed)
 Physical Therapy Session Note  Patient Details  Name: Travis [name] MRN: [id number] Date of Birth: 12-10-1940  Today's Date: 11/16/2023 PT Individual Time: 0805-0915 PT Individual Time Calculation (min): 70 min   Short Term Goals: Week 1:  PT Short Term Goal 1 (Week 1): Pt will complete bed mobility with supervision PT Short Term Goal 2 (Week 1): Pt will complete transfers wtih CGA PT Short Term Goal 3 (Week 1): Pt will complete gait 150' with LRAD CGA  Skilled Therapeutic Interventions/Progress Updates:    Pt presents in room in [facility] at sink, agreeable to PT. Pt reporting 3/10 pain in R hip. Session focused on gait training with rollator, NMR for core/BLE muscle fiber recruitment and dynamic standing balance. Pt completes transfers throughout session with supervision. Pt ambulates with RW with supervision from room to main gym ~200' and comes to sitting in arm chair for rest break. Pt completes gait training ~200' with rollator with cues for brake management, controlling speed, managing up/down incline with pt demonstrating understanding. Pt then completes NMR for core/BLE muscle fiber recruitment, dynamic standing balance including: - standing shoulder press 4# med ball superset with goblet squats 4# med ball 3x10 each - deadlifts 25# kettlebell 2x3 (picking up from 9 step) - rebounder x20 1# med ball, noncompliant surface, 2x20 1# med ball compliant surface - sit to stands BLEs on airex pad x10 - staggered sit to stands BLE  - RDLs with tidal tank x10 Pt provided with seated rest breaks between all gait trials and exercises to promote energy conservation and quality with tasks. Pt returns to room and remains seated in [facility] with all needs within reach, cal light in place and chair alarm donned and activated at end of session.   Therapy Documentation Precautions:  Precautions Precautions: Fall Recall of Precautions/Restrictions: Impaired Precaution/Restrictions Comments: Unable to recall  anterior approach precautions. Restrictions Weight Bearing Restrictions Per Provider Order: Yes RLE Weight Bearing Per Provider Order: Weight bearing as tolerated    Therapy/Group: Individual Therapy  Reche Ohara PT, DPT 11/16/2023, 9:17 AM

## 2023-11-17 ENCOUNTER — Telehealth: Payer: Self-pay

## 2023-11-17 DIAGNOSIS — S72001A Fracture of unspecified part of neck of right femur, initial encounter for closed fracture: Secondary | ICD-10-CM | POA: Diagnosis not present

## 2023-11-17 LAB — GLUCOSE, CAPILLARY
Glucose-Capillary: 101 mg/dL — ABNORMAL HIGH (ref 70–99)
Glucose-Capillary: 164 mg/dL — ABNORMAL HIGH (ref 70–99)
Glucose-Capillary: 172 mg/dL — ABNORMAL HIGH (ref 70–99)
Glucose-Capillary: 226 mg/dL — ABNORMAL HIGH (ref 70–99)
Glucose-Capillary: 91 mg/dL (ref 70–99)

## 2023-11-17 NOTE — Progress Notes (Signed)
 PROGRESS NOTE   Subjective/Complaints: Vital stable.  No events overnight. Patient feeling well today, some mild pain in his leg but well-controlled Blood sugars much better controlled, incision site looks good.  ROS: Denies fevers, HA, chills, N/V, abdominal pain, constipation, diarrhea, SOB, cough, chest pain, new weakness or paraesthesias, mood change.   Dark stools  Objective:   No results found.  Recent Labs    11/15/23 0515 11/15/23 0829 11/16/23 0450  WBC 12.5*  --   --   HGB 9.0* 9.8* 8.9*  HCT 26.1* 29.7* 25.9*  PLT 369  --   --     Recent Labs    11/15/23 0515 11/16/23 0450  NA 135 134*  K 3.6 3.9  CL 103 105  CO2 21* 21*  GLUCOSE 128* 123*  BUN 34* 33*  CREATININE 1.19 1.23  CALCIUM  9.4 9.1     Intake/Output Summary (Last 24 hours) at 11/17/2023 0800 Last data filed at 11/17/2023 0005 Gross per 24 hour  Intake 476 ml  Output 400 ml  Net 76 ml     Wound 11/09/23 1600 Pressure Injury Coccyx Mid Unstageable - Full thickness tissue loss in which the base of the injury is covered by slough (yellow, tan, gray, green or brown) and/or eschar (tan, brown or black) in the wound bed. (Active)     Wound 11/09/23 1600 Pressure Injury Heel Left Deep Tissue Pressure Injury - Purple or maroon localized area of discolored intact skin or blood-filled blister due to damage of underlying soft tissue from pressure and/or shear. (Active)     Wound 11/09/23 1600 Pressure Injury Toe (Comment  which one) Anterior;Right Deep Tissue Pressure Injury - Purple or maroon localized area of discolored intact skin or blood-filled blister due to damage of underlying soft tissue from pressure and/or shear (Active)    Physical Exam: Vital Signs Blood pressure 117/64, pulse 82, temperature 97.6 F (36.4 C), resp. rate 19, height 5' 11 (1.803 m), weight 68 kg, SpO2 97%.  Constitutional: No apparent distress. Appropriate  appearance for age.  Sitting up in bedside chair.  Head on pillow on his tray table-reports he is comfortable this way HENT: No JVD. Neck Supple. Trachea midline. Atraumatic, normocephalic. Eyes: PERRLA. Cardiovascular: RRR, no murmurs/rub/gallops.  1+ bilateral peripheral edema. Respiratory: CTAB. No rales, rhonchi, or wheezing. On RA.  Abdomen: + bowel sounds, normoactive. No distention or tenderness.  Skin: C/D/I.  + R hip Surgical site with minimal drainage--stable + R elbow skin tear - c/d/I.  + blancheable erythema bilateral 1st toes and heels--covered in mepilex  MSK:      No apparent deformity. + R hip ROM deficit d/t pain  Neurologic exam:  Cognition: AAO to person, place, time and event.   Insight: Good  insight into current condition.  Mood: Pleasant affect, appropriate mood.   Strength: UEs 5-/5 throughout LLE 5-/5 throughout RLE-4/5 in HF, 5-/5KE, KF, 5/5 distally  Physical exam unchanged from the above on reexamination 11/17/23    Assessment/Plan: 1. Functional deficits which require 3+ hours per day of interdisciplinary therapy in a comprehensive inpatient rehab setting. Physiatrist is providing close team supervision and 24 hour management of active medical problems  listed below. Physiatrist and rehab team continue to assess barriers to discharge/monitor patient progress toward functional and medical goals  Care Tool:  Bathing    Body parts bathed by patient: Right arm, Left arm, Chest, Abdomen, Front perineal area, Buttocks, Right upper leg, Left upper leg, Face   Body parts bathed by helper: Right lower leg, Left lower leg     Bathing assist Assist Level: Minimal Assistance - Patient > 75%     Upper Body Dressing/Undressing Upper body dressing   What is the patient wearing?: Pull over shirt    Upper body assist Assist Level: Set up assist    Lower Body Dressing/Undressing Lower body dressing      What is the patient wearing?: Underwear/pull up,  Pants     Lower body assist Assist for lower body dressing: Maximal Assistance - Patient 25 - 49%     Toileting Toileting    Toileting assist Assist for toileting: Supervision/Verbal cueing     Transfers Chair/bed transfer  Transfers assist     Chair/bed transfer assist level: Supervision/Verbal cueing     Locomotion Ambulation   Ambulation assist      Assist level: Supervision/Verbal cueing Assistive device: Rollator Max distance: 200'   Walk 10 feet activity   Assist     Assist level: Supervision/Verbal cueing Assistive device: Rollator   Walk 50 feet activity   Assist    Assist level: Supervision/Verbal cueing Assistive device: Rollator    Walk 150 feet activity   Assist Walk 150 feet activity did not occur: Safety/medical concerns (nausea with 100' walk)  Assist level: Supervision/Verbal cueing Assistive device: Rollator    Walk 10 feet on uneven surface  activity   Assist Walk 10 feet on uneven surfaces activity did not occur: Safety/medical concerns   Assist level: Contact Guard/Touching assist Assistive device: Rollator   Wheelchair     Assist Is the patient using a wheelchair?: Yes Type of Wheelchair: Manual    Wheelchair assist level: Dependent - Patient 0%      Wheelchair 50 feet with 2 turns activity    Assist        Assist Level: Dependent - Patient 0%   Wheelchair 150 feet activity     Assist      Assist Level: Dependent - Patient 0%   Blood pressure 117/64, pulse 82, temperature 97.6 F (36.4 C), resp. rate 19, height 5' 11 (1.803 m), weight 68 kg, SpO2 97%.  Medical Problem List and Plan: 1. 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  Weightbearing as tolerated             -patient may  shower-cover surgical dressing and skin tears             -ELOS/Goals: 12-14 days- supervision - 11/9 DC              - Stable to continue CIR   - 11/4: Lives alone, needs to be IND for home because PRN support. SPV PT up to 250 ft with walker, Setup UP, Min A LB due to hip stiffness when getting pants on. Needs occassional cueing for recall. Will need BSC.   2.  Antithrombotics: -DVT/anticoagulation:  Pharmaceutical: Eliquis              -antiplatelet therapy: N/A 3. Pain Management: Robaxin 500 mg 3 times daily, hydrocodone 5-325 mg 1 tablet every 6 hours as needed  - Pain well-controlled on  current regimen 4. Mood/Behavior/Sleep: Melatonin 3 mg nightly             -antipsychotic agents: N/A 5. Neuropsych/cognition: This patient is capable of making decisions on his own behalf. 6. Skin/Wound Care: Routine skin checks   - 10/29: L heel and b/l 1st toe DTIs; add prevalon boots when in bed  - 10/31: WOC consult for sacral unstageable, left heel/toe, and right arm eschar.  Cleanse coccyx/buttocks wound with Vashe, do not rinse and allow to air dry. Apply 1/4 thick layer of Santyl to wound bed, top with saline moist gauze, dry gauze and silicone foam.  May lift foam daily to replace Santyl, change foam q3 days and prn soiling.  Cleanse L heel and L great toe with soap and water, dry and apply Xeroform gauze Soila [phone]) every other day to purple discoloration.  Place L foot in Prevalon boot to offload pressure.  Cleanse R arm wound with Vashe, apply Xeroform gauze to wound bed daily and secure with silicone foam or Kerlix roll gauze whichever is preferred.  Soak dressing with NS if adhered to wound bed for atraumatic removal.  11-3: Has been 2 weeks postop, will message Dr. Ernie regarding removal of dressing--cleared by Rosina Calin, PA to remove dressing 11-4, order placed  7. Fluids/Electrolytes/Nutrition: Routine in and outs with follow-up chemistries  - Admission labs stable  8.  Acute blood loss anemia.  Transfused 1 unit packed red blood cells follow-up CBC  - Hemoglobin improved to 11 on admission  labs; monitor  -11-3: Hemoglobin down to 9.0.  Elevated BUN indicating possible GI etiology.  Get FOBT, repeat H&H to confirm.  Adding Protonix 40 mg twice daily.--Repeat stable, 9.8.  9.  Hypotension.  Norvasc  5 mg daily.  HCTZ 12.5 mg daily and lisinopril  20 mg daily currently on hold and resume as needed.  Monitor with increased mobility  - 11-4: Some peripheral edema, BP soft.  DC Norvasc .    11/17/2023    6:00 AM 11/16/2023    8:27 PM 11/16/2023    1:11 PM  Vitals with BMI  Systolic 117 110 888  Diastolic 64 56 54  Pulse 82 76 75  -11/1-2 BP soft but stable, continue to monitor   10.  Hyperlipidemia.  Lipitor/Zetia  11.  Constipation.  MiraLAX daily prn per pt request, Senokot-S 1 tablet twice daily  - Monitor today, and may need sorbitol  - 10-30: Sincrease sennakot to 2 tabs BID  - 10-31: No BMs recorded; will resume daily MiraLAX  -11/3 LBM  --small    12.  Diabetes mellitus.  SSI.  Hemoglobin A1c 6.2  - Blood sugars well-controlled, monitor Recent Labs    11/16/23 1550 11/17/23 0045 11/17/23 0601  GLUCAP 180* 91 101*  - 11/1 CBGs controlled, continue to monitor - 11/2 Fair control, continue to monitor  - 11-3: Consistently high blood sugars at nighttime.  However, cautious to add nightly insulin given a.m. lows.  Hemoglobin A1c is at goal for age, resume home metformin  500 mg twice daily. 11-4: Creatinine tolerating metformin , blood sugars improved.  Monitor on current regimen.  13.  CAD/permanent pacemaker/ventricular tachycardia- Afib s/p conversion 6 weeks ago .  Continue Eliquis .  Follow-up cardiology services Dr.Crotoru  - 11-3: Creatinine now in range to resume Eliquis  at 5 mg twice daily dosing.  Will hold off on this until FOBT back  14.  AKI on CKD3A.  Cr got to  2.5- down to 1.3 Received gentle IV fluids.  Follow-up  chemistries  - Admission labs with creatinine 1.3; trend  -BMP tomorrow--creatinine improved, BUN uptrending as above.    - 11-4: Stable  15.  Glaucoma- con't drops  16.  Thrombocytosis.  Likely reactive, trend.  - Resolved  LOS: 8 days A FACE TO FACE EVALUATION WAS PERFORMED  Travis [name] 11/17/2023, 8:00 AM

## 2023-11-17 NOTE — Progress Notes (Signed)
 Occupational Therapy Session Note  Patient Details  Name: Travis [name] MRN: [id number] Date of Birth: 04/27/1940  Today's Date: 11/17/2023 OT Individual Time: 8692-8584 OT Individual Time Calculation (min): 68 min    Short Term Goals: Week 1:  OT Short Term Goal 1 (Week 1): STGs=LTGs due to patient's estimated length of stay. OT Short Term Goal 1 - Progress (Week 1): Progressing toward goal Week 2:  OT Short Term Goal 1 (Week 2): STGs=LTGs due to patient's estimated length of stay.  Skilled Therapeutic Interventions/Progress Updates:    1:1 Pt received in the w/c. Discussed discharge plans to each of his daughter's homes for a week each and then back to his house. He rpeorted that his late wife's Rollator is not in working order and requesting a new one/ SW made aware. Pt ambulated to tub room and educated on tub bench (he reports of of his daughters and his home has a tub) to access shower safely. Pt able to assist his right LE over the tub without assistance; getting in and out with supervision. Reports one of his daughters has one and can use it. Pt ambulated to the big gym  Therapeutic activity addressing dynamic balance and activity tolerance. Performed sit <>stands while holding the Tidal wave, alternating toe taps on collar markers on the floor without UE support, shoulder presses, shoulder horizontal raises, bicep curls and tricep pulls. Also climbed and descended 11 steps in the stairwell with close supervision up and with HHA for support on the way down. Seated rest break afterwards. Pt reports one of the daughters has ~16 steps to navigate.  Ambulated ~ 250 feet all over unit with Rollator with supervision to mod I demonstrating safety of obstacles and proper use of brakes.  Returned to room and left sitting up in w/c.  Therapy Documentation Precautions:  Precautions Precautions: Fall Recall of Precautions/Restrictions: Impaired Precaution/Restrictions Comments: Unable to  recall anterior approach precautions. Restrictions Weight Bearing Restrictions Per Provider Order: Yes RLE Weight Bearing Per Provider Order: Weight bearing as tolerated  Pain:  2/10 in right hip but able to continue and rest prn    Therapy/Group: Individual Therapy  [place], [facility] 11/17/2023, 8:23 PM

## 2023-11-17 NOTE — Telephone Encounter (Signed)
 Pt called in stating that he is rehab and he wont be near home monitor for a few weeks, but he will be there before his next remote 01/21/2024

## 2023-11-17 NOTE — Progress Notes (Signed)
 Physical Therapy Session Note  Patient Details  Name: Travis [name] MRN: [id number] Date of Birth: [birth year]  Today's Date: 11/17/2023 PT Individual Time: 8550-8482 PT Individual Time Calculation (min): 28 min   Short Term Goals: Week 1:  PT Short Term Goal 1 (Week 1): Pt will complete bed mobility with supervision PT Short Term Goal 2 (Week 1): Pt will complete transfers wtih CGA PT Short Term Goal 3 (Week 1): Pt will complete gait 150' with LRAD CGA  Skilled Therapeutic Interventions/Progress Updates:    Pt presents in room seated in [facility], agreeable to PT. Pt denies pain throughout session. Session focused on therapeutic activities to promote upright tolerance and community integration navigating outside over uneven ground with rollator. Pt ambulates throughout session with supervision inside over even surfaces, ambulating from room, navigating elevator, and outside with supervision. Pt completes navigating with rollator over uneven surfaces in community environment with education and cues for managing sidewalks and avoiding traffic, able to ambulate up/down incline with CGA with cues for using brakes on rollator. Pt then returns to unit, completes in 990', which is slightly below age related norms indicating increased risk for falls. Pt returns to room and remains seated in [facility] with all needs within reach, cal light in place and chair alarm activated at end of session.   Therapy Documentation Precautions:  Precautions Precautions: Fall Recall of Precautions/Restrictions: Impaired Precaution/Restrictions Comments: Unable to recall anterior approach precautions. Restrictions Weight Bearing Restrictions Per Provider Order: Yes RLE Weight Bearing Per Provider Order: Weight bearing as tolerated    Therapy/Group: Individual Therapy  Reche Ohara PT, DPT 11/17/2023, 4:45 PM

## 2023-11-17 NOTE — Progress Notes (Signed)
 Occupational Therapy Weekly Progress Note  Patient Details  Name: Travis [name] MRN: [id number] Date of Birth: 10/04/1940  Beginning of progress report period: November 10, 2023 End of progress report period: November 17, 2023  Today's Date: 11/17/2023 OT Individual Time: 9064-8954 OT Individual Time Calculation (min): 70 min    Patient continues to make progress towards LTG/POC, see below for  current level of ADL functioning. Caregiver education has been scheduled for 11/6 with an anticipated discharge date of 11/9 (at the time of this note).   Patient continues to demonstrate the following deficits: muscle weakness and muscle joint tightness, decreased cardiorespiratoy endurance, and decreased balance strategies and difficulty maintaining precautions and therefore will continue to benefit from skilled OT intervention to enhance overall performance with BADL, iADL, and Reduce care partner burden.  Patient progressing toward long term goals..  Continue plan of care.  OT Short Term Goals Week 1:  OT Short Term Goal 1 (Week 1): STGs=LTGs due to patient's estimated length of stay. OT Short Term Goal 1 - Progress (Week 1): Progressing toward goal Week 2:  OT Short Term Goal 1 (Week 2): STGs=LTGs due to patient's estimated length of stay.  Skilled Therapeutic Interventions/Progress Updates:  Pt greeted sitting in [facility] for skilled OT session with focus on BADL retraining and functional transfers.   Pain: Pt with mild surgical pain. OT offering intermediate rest breaks and positioning suggestions throughout session to address pain/fatigue and maximize participation/safety in session.   Functional Transfers: Sit<>stands during care with supervision + UE support. Ambulatory bathroom transfers in similar fashion.   Self Care Tasks: Pt completes the following self care tasks with levels of assistance noted below, UB: Setup A provided for bathing/dressing.  LB: Undressing with use of reacher for  unthreading LB garment. Dressing stick introduced for doffing socks. Max A for footwear management, re-iterated benefits of slip-on shoes and/or elastic shoe laces. Bathing with use of long-handled sponge to reach distal LE. Lateral leans utilized for posterior periarea cleansing. Educated on use of reacher wrapped with towel to dry BLE. Threading/hiking of LB garments with supervision + AE + increased time. OT provides foam dressing to buttock, L heel, and L big toe for skin integrity management.  Seated sink-side care with distant supervision-Mod I.  3/3 toileting tasks with setup A of washcloths due to messy BM.   Pt remained sitting in WC, NT present, 4Ps assessed and immediate needs met. Pt continues to be appropriate for skilled OT intervention to promote further functional independence in ADLs/IADLs.   Therapy Documentation Precautions:  Precautions Precautions: Fall Recall of Precautions/Restrictions: Impaired Precaution/Restrictions Comments: Unable to recall anterior approach precautions. Restrictions Weight Bearing Restrictions Per Provider Order: Yes RLE Weight Bearing Per Provider Order: Weight bearing as tolerated   Therapy/Group: Individual Therapy  Nereida Habermann, OTR/L, MSOT  11/17/2023, 6:59 AM

## 2023-11-17 NOTE — Plan of Care (Signed)
  Problem: Consults Goal: RH GENERAL PATIENT EDUCATION Description: See Patient Education module for education specifics. Outcome: Progressing Goal: Nutrition Consult-if indicated Outcome: Progressing Goal: Diabetes Guidelines if Diabetic/Glucose > 140 Description: If diabetic or lab glucose is > 140 mg/dl - Initiate Diabetes/Hyperglycemia Guidelines & Document Interventions  Outcome: Progressing   Problem: RH BOWEL ELIMINATION Goal: RH STG MANAGE BOWEL WITH ASSISTANCE Description: STG Manage Bowel with mod I Assistance. Outcome: Progressing Goal: RH STG MANAGE BOWEL W/MEDICATION W/ASSISTANCE Description: STG Manage Bowel with Medication with mod I Assistance. Outcome: Progressing   Problem: RH SAFETY Goal: RH STG ADHERE TO SAFETY PRECAUTIONS W/ASSISTANCE/DEVICE Description: STG Adhere to Safety Precautions With cues Assistance/Device. Outcome: Progressing   Problem: RH PAIN MANAGEMENT Goal: RH STG PAIN MANAGED AT OR BELOW PT'S PAIN GOAL Description: Pain < 4 with prns Outcome: Progressing   Problem: RH KNOWLEDGE DEFICIT GENERAL Goal: RH STG INCREASE KNOWLEDGE OF SELF CARE AFTER HOSPITALIZATION Description: Patient and dtr will be able to manage care at discharge using educational resources for medications and dietary modification independently Outcome: Progressing   Problem: Education: Goal: Ability to describe self-care measures that may prevent or decrease complications (Diabetes Survival Skills Education) will improve Outcome: Progressing Goal: Individualized Educational Video(s) Outcome: Progressing   Problem: Coping: Goal: Ability to adjust to condition or change in health will improve Outcome: Progressing   Problem: Fluid Volume: Goal: Ability to maintain a balanced intake and output will improve Outcome: Progressing   Problem: Health Behavior/Discharge Planning: Goal: Ability to identify and utilize available resources and services will improve Outcome:  Progressing Goal: Ability to manage health-related needs will improve Outcome: Progressing   Problem: Metabolic: Goal: Ability to maintain appropriate glucose levels will improve Outcome: Progressing   Problem: Nutritional: Goal: Maintenance of adequate nutrition will improve Outcome: Progressing Goal: Progress toward achieving an optimal weight will improve Outcome: Progressing   Problem: Skin Integrity: Goal: Risk for impaired skin integrity will decrease Outcome: Progressing   Problem: Tissue Perfusion: Goal: Adequacy of tissue perfusion will improve Outcome: Progressing

## 2023-11-17 NOTE — Progress Notes (Signed)
 Physical Therapy Session Note  Patient Details  Name: Travis [name] MRN: [id number] Date of Birth: February 02, 1940  Today's Date: 11/17/2023 PT Individual Time: 0805-0830 PT Individual Time Calculation (min): 25 min   Short Term Goals: Week 1:  PT Short Term Goal 1 (Week 1): Pt will complete bed mobility with supervision PT Short Term Goal 2 (Week 1): Pt will complete transfers wtih CGA PT Short Term Goal 3 (Week 1): Pt will complete gait 150' with LRAD CGA  Skilled Therapeutic Interventions/Progress Updates: Pt presented in w/c agreeable to therapy. Pt states pain 2/10, no intervention requested. Session focused on use of NuStep for general conditioning, ROM, and increased BLE ms recruitment. Pt transported to ortho gym for time management. Performed stand step transfer with RW and supervision. Participated in NuStep L4 x 10 min. While on NuStep provided education on f/u care (pt states still undecided if wanted HHPT or OPPT). Also discussed option/benefits of aquatic therapy. Once completed pt transferred back to w/c in same manner as prior. Pt transported back to room and remained in w/c with call bell within reach and needs met.      Therapy Documentation Precautions:  Precautions Precautions: Fall Recall of Precautions/Restrictions: Impaired Precaution/Restrictions Comments: Unable to recall anterior approach precautions. Restrictions Weight Bearing Restrictions Per Provider Order: Yes RLE Weight Bearing Per Provider Order: Weight bearing as tolerated General:   Vital Signs: Therapy Vitals Temp: 97.6 F (36.4 C) Pulse Rate: 82 Resp: 19 BP: 117/64 Patient Position (if appropriate): Lying Oxygen Therapy SpO2: 97 % O2 Device: Room Air   Therapy/Group: Individual Therapy  Vondra Aldredge 11/17/2023, 8:46 AM

## 2023-11-17 NOTE — Progress Notes (Addendum)
 Patient ID: Travis [name], male   DOB: [birth year], 83 y.o.   MRN: [id number]  SW met with pt in room to follow-up about updates from team conference. SW discussed fam edu tomorrow. SW shared d/c recs- outpatient PT/OT and DME- 3in1 BSC and RW. Pt reports he has a 3in1 BSC already. SW will order a rollator vs RW. Will discuss on outpatient location during family edu tomorrow. Preferred outpatient location is [facility], however, SW informed [facility] only offers PT. SW will send referral to [facility] for [facility]. SW will confirm d/c location as pt reports he will go to his other daughter's home.   SW ordered rollator and TTB with [employer] via parachute.   1354- SW spoke with his dtr Travis [name] to discuss about DME. Confirms he will go to Travis [name]'s home for the first week, and then back to Travis [name]'s home for another week. She has threshold step to enter, no downstairs bedroom, and will walk a full flight of stairs between 12-15 steps to get into the home she has a tub shower and the tub is 3 in higher than standard. SW discussed TTB. SW will order and she will cancel if able to find one.   Graeme Jude, MSW, LCSW Office: [phone] Cell: [phone] Fax: [phone]

## 2023-11-18 ENCOUNTER — Inpatient Hospital Stay (HOSPITAL_COMMUNITY)

## 2023-11-18 ENCOUNTER — Other Ambulatory Visit (HOSPITAL_BASED_OUTPATIENT_CLINIC_OR_DEPARTMENT_OTHER): Payer: Self-pay

## 2023-11-18 DIAGNOSIS — S72001A Fracture of unspecified part of neck of right femur, initial encounter for closed fracture: Secondary | ICD-10-CM | POA: Diagnosis not present

## 2023-11-18 LAB — BASIC METABOLIC PANEL WITH GFR
Anion gap: 8 (ref 5–15)
BUN: 28 mg/dL — ABNORMAL HIGH (ref 8–23)
CO2: 22 mmol/L (ref 22–32)
Calcium: 9.1 mg/dL (ref 8.9–10.3)
Chloride: 106 mmol/L (ref 98–111)
Creatinine, Ser: 1.17 mg/dL (ref 0.61–1.24)
GFR, Estimated: >60 mL/min (ref >60)
Glucose, Bld: 114 mg/dL — ABNORMAL HIGH (ref 70–99)
Potassium: 3.8 mmol/L (ref 3.5–5.1)
Sodium: 136 mmol/L (ref 135–145)

## 2023-11-18 LAB — CBC
HCT: 25.9 % — ABNORMAL LOW (ref 39.0–52.0)
Hemoglobin: 8.9 g/dL — ABNORMAL LOW (ref 13.0–17.0)
MCH: 32.8 pg (ref 26.0–34.0)
MCHC: 34.4 g/dL (ref 30.0–36.0)
MCV: 95.6 fL (ref 80.0–100.0)
Platelets: 352 K/uL (ref 150–400)
RBC: 2.71 MIL/uL — ABNORMAL LOW (ref 4.22–5.81)
RDW: 14.9 % (ref 11.5–15.5)
WBC: 8.1 K/uL (ref 4.0–10.5)
nRBC: 0 % (ref 0.0–0.2)

## 2023-11-18 LAB — GLUCOSE, CAPILLARY
Glucose-Capillary: 123 mg/dL — ABNORMAL HIGH (ref 70–99)
Glucose-Capillary: 156 mg/dL — ABNORMAL HIGH (ref 70–99)
Glucose-Capillary: 117 mg/dL — ABNORMAL HIGH (ref 70–99)
Glucose-Capillary: 275 mg/dL — ABNORMAL HIGH (ref 70–99)

## 2023-11-18 LAB — OCCULT BLOOD X 1 CARD TO LAB, STOOL: Fecal Occult Bld: NEGATIVE

## 2023-11-18 MED ORDER — APIXABAN 5 MG PO TABS
5.0000 mg | ORAL_TABLET | Freq: Two times a day (BID) | ORAL | Status: DC
  Administered 2023-11-18 – 2023-11-21 (×6): 5 mg via ORAL
  Filled 2023-11-18 (×6): qty 1

## 2023-11-18 MED ORDER — DICLOFENAC SODIUM 1 % EX GEL
2.0000 g | Freq: Four times a day (QID) | CUTANEOUS | Status: DC
  Administered 2023-11-18 – 2023-11-21 (×8): 2 g via TOPICAL
  Filled 2023-11-18: qty 100

## 2023-11-18 NOTE — Progress Notes (Signed)
 PROGRESS NOTE   Subjective/Complaints: Vital stable.  No events overnight. BUN improving, labs stable.   Patient states that he rolled in bed last night and felt some pain in his right hip, now increasingly stiff today.  Minimal pain associated.  ROS: Denies fevers, HA, chills, N/V, abdominal pain, constipation, diarrhea, SOB, cough, chest pain, new weakness or paraesthesias, mood change.   Stiffness in right hip/knee  Objective:   No results found.  Recent Labs    11/16/23 0450 11/18/23 0502  WBC  --  8.1  HGB 8.9* 8.9*  HCT 25.9* 25.9*  PLT  --  352    Recent Labs    11/16/23 0450 11/18/23 0502  NA 134* 136  K 3.9 3.8  CL 105 106  CO2 21* 22  GLUCOSE 123* 114*  BUN 33* 28*  CREATININE 1.23 1.17  CALCIUM  9.1 9.1     Intake/Output Summary (Last 24 hours) at 11/18/2023 0919 Last data filed at 11/18/2023 0758 Gross per 24 hour  Intake 692 ml  Output 450 ml  Net 242 ml     Wound 11/09/23 1600 Pressure Injury Coccyx Mid Unstageable - Full thickness tissue loss in which the base of the injury is covered by slough (yellow, tan, gray, green or brown) and/or eschar (tan, brown or black) in the wound bed. (Active)     Wound 11/09/23 1600 Pressure Injury Heel Left Deep Tissue Pressure Injury - Purple or maroon localized area of discolored intact skin or blood-filled blister due to damage of underlying soft tissue from pressure and/or shear. (Active)     Wound 11/09/23 1600 Pressure Injury Toe (Comment  which one) Anterior;Right Deep Tissue Pressure Injury - Purple or maroon localized area of discolored intact skin or blood-filled blister due to damage of underlying soft tissue from pressure and/or shear (Active)    Physical Exam: Vital Signs Blood pressure (!) 125/59, pulse 81, temperature (!) 97.5 F (36.4 C), temperature source Oral, resp. rate 16, height 5' 11 (1.803 m), weight 68 kg, SpO2  96%.  Constitutional: No apparent distress. Appropriate appearance for age.  Sitting up in therapy gym. HENT: No JVD. Neck Supple. Trachea midline. Atraumatic, normocephalic. Eyes: PERRLA. Cardiovascular: RRR, no murmurs/rub/gallops.  1+ bilateral peripheral edema. Respiratory: CTAB. No rales, rhonchi, or wheezing. On RA.  Abdomen: + bowel sounds, normoactive. No distention or tenderness.  Skin: C/D/I.  + R hip Surgical site with minimal drainage--stable + R elbow skin tear - c/d/I.  + blancheable erythema bilateral 1st toes and heels--covered in mepilex  MSK:      No apparent deformity. + Tightness in right hamstrings, unable to passively extend leg to neutral.  Mild pain behind the knee, extending up into the right hip with range of motion.  Neurologic exam:  Cognition: AAO to person, place, time and event.   Insight: Good  insight into current condition.  Mood: Pleasant affect, appropriate mood.   Strength: UEs 5-/5 throughout LLE 5-/5 throughout RLE-4/5 in HF, 5-/5KE, KF, 5/5 distally   Assessment/Plan: 1. Functional deficits which require 3+ hours per day of interdisciplinary therapy in a comprehensive inpatient rehab setting. Physiatrist is providing close team supervision and 24 hour management  of active medical problems listed below. Physiatrist and rehab team continue to assess barriers to discharge/monitor patient progress toward functional and medical goals  Care Tool:  Bathing    Body parts bathed by patient: Right arm, Left arm, Chest, Abdomen, Front perineal area, Buttocks, Right upper leg, Left upper leg, Face, Right lower leg, Left lower leg   Body parts bathed by helper: Right lower leg, Left lower leg     Bathing assist Assist Level: Supervision/Verbal cueing     Upper Body Dressing/Undressing Upper body dressing   What is the patient wearing?: Pull over shirt    Upper body assist Assist Level: Set up assist    Lower Body Dressing/Undressing Lower  body dressing      What is the patient wearing?: Underwear/pull up, Pants     Lower body assist Assist for lower body dressing: Minimal Assistance - Patient > 75% (Using AE.)     Toileting Toileting    Toileting assist Assist for toileting: Supervision/Verbal cueing     Transfers Chair/bed transfer  Transfers assist     Chair/bed transfer assist level: Supervision/Verbal cueing     Locomotion Ambulation   Ambulation assist      Assist level: Supervision/Verbal cueing Assistive device: Rollator Max distance: 200'   Walk 10 feet activity   Assist     Assist level: Supervision/Verbal cueing Assistive device: Rollator   Walk 50 feet activity   Assist    Assist level: Supervision/Verbal cueing Assistive device: Rollator    Walk 150 feet activity   Assist Walk 150 feet activity did not occur: Safety/medical concerns (nausea with 100' walk)  Assist level: Supervision/Verbal cueing Assistive device: Rollator    Walk 10 feet on uneven surface  activity   Assist Walk 10 feet on uneven surfaces activity did not occur: Safety/medical concerns   Assist level: Contact Guard/Touching assist Assistive device: Rollator   Wheelchair     Assist Is the patient using a wheelchair?: Yes Type of Wheelchair: Manual    Wheelchair assist level: Dependent - Patient 0%      Wheelchair 50 feet with 2 turns activity    Assist        Assist Level: Dependent - Patient 0%   Wheelchair 150 feet activity     Assist      Assist Level: Dependent - Patient 0%   Blood pressure (!) 125/59, pulse 81, temperature (!) 97.5 F (36.4 C), temperature source Oral, resp. rate 16, height 5' 11 (1.803 m), weight 68 kg, SpO2 96%.  Medical Problem List and Plan: 1. 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  Weightbearing as tolerated             -patient may   shower-cover surgical dressing and skin tears             -ELOS/Goals: 12-14 days- supervision - 11/9 DC             - Stable to continue CIR   - 11/4: Lives alone, needs to be IND for home because PRN support. SPV PT up to 250 ft with walker, Setup UP, Min A LB due to hip stiffness when getting pants on. Needs occassional cueing for recall. Will need BSC.   2.  Antithrombotics: -DVT/anticoagulation:  Pharmaceutical: Eliquis              -antiplatelet therapy: N/A 3. Pain Management: Robaxin 500 mg 3 times daily, hydrocodone 5-325 mg  1 tablet every 6 hours as needed  - Pain well-controlled on current regimen 4. Mood/Behavior/Sleep: Melatonin 3 mg nightly             -antipsychotic agents: N/A 5. Neuropsych/cognition: This patient is capable of making decisions on his own behalf. 6. Skin/Wound Care: Routine skin checks   - 10/29: L heel and b/l 1st toe DTIs; add prevalon boots when in bed  - 10/31: WOC consult for sacral unstageable, left heel/toe, and right arm eschar.  Cleanse coccyx/buttocks wound with Vashe, do not rinse and allow to air dry. Apply 1/4 thick layer of Santyl to wound bed, top with saline moist gauze, dry gauze and silicone foam.  May lift foam daily to replace Santyl, change foam q3 days and prn soiling.  Cleanse L heel and L great toe with soap and water, dry and apply Xeroform gauze Soila [id number]) every other day to purple discoloration.  Place L foot in Prevalon boot to offload pressure.  Cleanse R arm wound with Vashe, apply Xeroform gauze to wound bed daily and secure with silicone foam or Kerlix roll gauze whichever is preferred.  Soak dressing with NS if adhered to wound bed for atraumatic removal.  11-3: Has been 2 weeks postop, will message Dr. Ernie regarding removal of dressing--cleared by Rosina Calin, PA to remove dressing 1  7. Fluids/Electrolytes/Nutrition: Routine in and outs with follow-up chemistries  - Admission labs stable  8.  Acute blood loss anemia.   Transfused 1 unit packed red blood cells follow-up CBC  - Hemoglobin improved to 11 on admission labs; monitor  -11-3: Hemoglobin down to 9.0.  Elevated BUN indicating possible GI etiology.  Get FOBT, repeat H&H to confirm.  Adding Protonix 40 mg twice daily.--Repeat stable, 9.8.  9.  Hypotension.  Norvasc  5 mg daily.  HCTZ 12.5 mg daily and lisinopril  20 mg daily currently on hold and resume as needed.  Monitor with increased mobility  - 11-4: Some peripheral edema, BP soft.  DC Norvasc .--Monitor, asymptomatic    11/18/2023    5:45 AM 11/17/2023    7:42 PM 11/17/2023    2:37 PM  Vitals with BMI  Systolic 125 116 894  Diastolic 59 55 52  Pulse 81 84 81  -11/1-2 BP soft but stable, continue to monitor   10.  Hyperlipidemia.  Lipitor/Zetia  11.  Constipation.  MiraLAX daily prn per pt request, Senokot-S 1 tablet twice daily  - Monitor today, and may need sorbitol  - 10-30: Sincrease sennakot to 2 tabs BID  - 10-31: No BMs recorded; will resume daily MiraLAX  -11/5 LBM  --small    12.  Diabetes mellitus.  SSI.  Hemoglobin A1c 6.2  - Blood sugars well-controlled, monitor Recent Labs    11/17/23 1617 11/17/23 2146 11/18/23 0546  GLUCAP 164* 226* 123*  - 11/1 CBGs controlled, continue to monitor - 11/2 Fair control, continue to monitor  - 11-3: Consistently high blood sugars at nighttime.  However, cautious to add nightly insulin given a.m. lows.  Hemoglobin A1c is at goal for age, resume home metformin  500 mg twice daily. 11-4: Creatinine tolerating metformin , blood sugars improved.  Monitor on current regimen.  13.  CAD/permanent pacemaker/ventricular tachycardia- Afib s/p conversion 6 weeks ago .  Continue Eliquis .  Follow-up cardiology services Dr.Crotoru  - 11-3: Creatinine now in range to resume Eliquis  at 5 mg twice daily dosing--> resumed 11-6 due to stable hemoglobin, FOBT still pending  14.  AKI on CKD3A.  Cr got to  2.5- down to 1.3 Received gentle IV fluids.  Follow-up  chemistries  - Admission labs with creatinine 1.3; trend  -BMP tomorrow--creatinine improved, BUN uptrending as above.    - 11-4: Stable  11-6: Improving  15. Glaucoma- con't drops  16.  Thrombocytosis.  Likely reactive, trend.  - Resolved  17.  Right hamstring tightnes/stiffness  - 11-6 femur x-ray shows hardware intact, mild patellar arthritis  - Add heat pack as needed the posterior leg  - Voltaren gel 4 times daily to the knee and hamstrings  LOS: 9 days A FACE TO FACE EVALUATION WAS PERFORMED  Travis [name] 11/18/2023, 9:19 AM

## 2023-11-18 NOTE — Progress Notes (Signed)
 Occupational Therapy Session Note  Patient Details  Name: Travis [name] MRN: [id number] Date of Birth: 1940-01-31  Today's Date: 11/18/2023 OT Individual Time: 0820-0930 OT Individual Time Calculation (min): 70 min   Today's Date: 11/18/2023 OT Individual Time: 8596-8564 OT Individual Time Calculation (min): 32 min   Short Term Goals: Week 2:  OT Short Term Goal 1 (Week 2): STGs=LTGs due to patient's estimated length of stay.  Skilled Therapeutic Interventions/Progress Updates:   Session 1: Pt greeted attempting to toilet, continent bladder void. Stand-pivot from BSC>WC with distant supervision + no AD. Seated sink-side grooming with Mod I. Pt ambulates from room>ortho gym with supervision + rollator, reversing to antalgic gait due to RLE stiffness/pain. In ortho gym, pt instructed in ~10 mins of Nustep modality for BLE gentle ROM/strengthening, as patient reported increased stiffness/pain with initial mobility. During length of therex, patient and OT discuss RLE positioning in bed, education provided on need to prevent outward foot positioning. Handout placed above [facility] for nursing staff. In ADL apartment, patient and OT verbally review energy conservation/safety techniques for simple meal prep (actual simulation deferred due to pain/discomfort). WC transport to remainder of therapy locations for pain management. Moist heat applied to area as patient completes 1x15-20 reps of chest press, shoulder press, diagonal reaches, and bicep curls with 4# medicine ball. Cuing provided for upright posture/effective pacing. Pt remained sitting in WC with 4Ps assessed and immediate needs met. Pt continues to be appropriate for skilled OT intervention to promote further functional independence in ADLs/IADLs.   Session 2:  Pt greeted with daughter present in room for scheduled caregiver education. Pt dependently transported from room>ADL apartment for simulation of home environment. Education with focus on OT  role, OT POC, and current patient functioning. Surgical precautions, LB dressing/bathing AE, bed mobility, and functional transfers reviewed. Fall reduction strategies covered, such as using BSC for bedtime toileting, placing needed items within reach (when alone at home), and removing rugs. Daughter provides supervision of ambulatory toilet transfer, and visual/verbal demonstration provided for tub/shower transfer. Handout provided for TTB. All questions answered at end of session. Pt remained in care of daughters in ADL apartment awaiting PT arrival.   Therapy Documentation Precautions:  Precautions Precautions: Fall Recall of Precautions/Restrictions: Impaired Precaution/Restrictions Comments: Unable to recall anterior approach precautions. Restrictions Weight Bearing Restrictions Per Provider Order: Yes RLE Weight Bearing Per Provider Order: Weight bearing as tolerated   Therapy/Group: Individual Therapy  Nereida Habermann, OTR/L, MSOT  11/18/2023, 6:55 AM

## 2023-11-18 NOTE — Progress Notes (Signed)
 Orthopedic Tech Progress Note Patient Details:  Travis [name] [birth year] [id number]  Ortho Devices Type of Ortho Device: Abduction pillow Ortho Device/Splint Interventions: Ordered   Patient was out of bed left abduction pillow at bedside. Post Interventions Patient Tolerated: Well   Thersia JULIANNA Travis [name] 11/18/2023, 1:17 PM

## 2023-11-18 NOTE — Progress Notes (Signed)
 Patient ID: Travis [name], male   DOB: Oct 04, 1940, 83 y.o.   MRN: [id number]  SW ordered 3in1 BSC per OT and pt reporting he does not have.   Graeme Jude, MSW, LCSW Office: [phone] Cell: [phone] Fax: [phone]

## 2023-11-18 NOTE — Progress Notes (Signed)
 Physical Therapy Session Note  Patient Details  Name: Travis [name] MRN: [id number] Date of Birth: Nov 05, 1940  Today's Date: 11/18/2023 PT Individual Time: 1005-1045 PT Individual Time Calculation (min): 40 min   Today's Date: 11/18/2023 PT Individual Time: 1435-1530 PT Individual Time Calculation (min): 55 min   Short Term Goals: Week 1:  PT Short Term Goal 1 (Week 1): Pt will complete bed mobility with supervision PT Short Term Goal 2 (Week 1): Pt will complete transfers wtih CGA PT Short Term Goal 3 (Week 1): Pt will complete gait 150' with LRAD CGA  Skilled Therapeutic Interventions/Progress Updates:     1st Session: Pt received seated in [facility] and agrees to therapy. Reports pain in Rt hip. PT provides education on pain as well as rest breaks to manage pain. WC transport to gym. Pt performs alternating LAQs to prepare knee extensors for weight bearing and upright mobility.Pt perofrms sit to stand with rollator and cues for hand placement and initiation. Pt ambulates x175' with rollator and CGA, with cues for upright posture to improve balance, as well as increasing Lt stride length to promote  reciprocal gait pattern and increased WB through RLE. PT lowers rollator handles to promote optimal body mechanics, and following rest break pt ambulates additional x175', verbalizing improved comfort with new height of rollator.   Pt completes Nustep for endurance training and increasing available ROM in RLE. Pt completes x10:00 at workload of 5 with average steps per minute ~60. PT provides cues for hand and foot placement and completing full available ROM.   Pt left seated in WC with all needs within reach.  2nd Session: Pt received seated in WC in virtual apartment, handed off from OT. Pt reports improved soreness from AM session. PT provides rest breaks as needed to manage pain. Pt's daughters present for family education. PT provides update on pt's mobility and progress at CIR, as well as  recommendations for safe mobility following discharge. Pt completes car transfer without AD, with CGA and cues for sequencing. Following rest break, pt completes x12 6 steps with Rt handrail and Lt HHA, with cues for step sequencing, positioning, and safety. Daughters educated on safe guarding technique.   Pt transfers to mat table and performs sit to supine with cues for positioning. Pt completes supine therex for strengthening and improving ROM of RLE. Pt completes x20 ankle pumps, x15 quad sets, SAQs, SLRs, and hip abduction. Pt perform supine to sit with minA and cues for sequencing. Stand step to [facility] with rollator. Pt left seated with all needs within reach.     Therapy Documentation Precautions:  Precautions Precautions: Fall Recall of Precautions/Restrictions: Impaired Precaution/Restrictions Comments: Unable to recall anterior approach precautions. Restrictions Weight Bearing Restrictions Per Provider Order: Yes RLE Weight Bearing Per Provider Order: Weight bearing as tolerated   Therapy/Group: Individual Therapy  Elsie JAYSON Dawn, PT, DPT 11/18/2023, 3:40 PM

## 2023-11-18 NOTE — Plan of Care (Signed)
  Problem: Consults Goal: RH GENERAL PATIENT EDUCATION Description: See Patient Education module for education specifics. Outcome: Progressing Goal: Nutrition Consult-if indicated Outcome: Progressing Goal: Diabetes Guidelines if Diabetic/Glucose > 140 Description: If diabetic or lab glucose is > 140 mg/dl - Initiate Diabetes/Hyperglycemia Guidelines & Document Interventions  Outcome: Progressing   Problem: RH BOWEL ELIMINATION Goal: RH STG MANAGE BOWEL WITH ASSISTANCE Description: STG Manage Bowel with mod I Assistance. Outcome: Progressing Goal: RH STG MANAGE BOWEL W/MEDICATION W/ASSISTANCE Description: STG Manage Bowel with Medication with mod I Assistance. Outcome: Progressing   Problem: RH SAFETY Goal: RH STG ADHERE TO SAFETY PRECAUTIONS W/ASSISTANCE/DEVICE Description: STG Adhere to Safety Precautions With cues Assistance/Device. Outcome: Progressing   Problem: RH PAIN MANAGEMENT Goal: RH STG PAIN MANAGED AT OR BELOW PT'S PAIN GOAL Description: Pain < 4 with prns Outcome: Progressing   Problem: RH KNOWLEDGE DEFICIT GENERAL Goal: RH STG INCREASE KNOWLEDGE OF SELF CARE AFTER HOSPITALIZATION Description: Patient and dtr will be able to manage care at discharge using educational resources for medications and dietary modification independently Outcome: Progressing   Problem: Education: Goal: Ability to describe self-care measures that may prevent or decrease complications (Diabetes Survival Skills Education) will improve Outcome: Progressing Goal: Individualized Educational Video(s) Outcome: Progressing   Problem: Coping: Goal: Ability to adjust to condition or change in health will improve Outcome: Progressing   Problem: Fluid Volume: Goal: Ability to maintain a balanced intake and output will improve Outcome: Progressing   Problem: Health Behavior/Discharge Planning: Goal: Ability to identify and utilize available resources and services will improve Outcome:  Progressing Goal: Ability to manage health-related needs will improve Outcome: Progressing   Problem: Metabolic: Goal: Ability to maintain appropriate glucose levels will improve Outcome: Progressing   Problem: Nutritional: Goal: Maintenance of adequate nutrition will improve Outcome: Progressing Goal: Progress toward achieving an optimal weight will improve Outcome: Progressing   Problem: Skin Integrity: Goal: Risk for impaired skin integrity will decrease Outcome: Progressing   Problem: Tissue Perfusion: Goal: Adequacy of tissue perfusion will improve Outcome: Progressing

## 2023-11-19 ENCOUNTER — Other Ambulatory Visit (HOSPITAL_COMMUNITY): Payer: Self-pay

## 2023-11-19 DIAGNOSIS — S72001A Fracture of unspecified part of neck of right femur, initial encounter for closed fracture: Secondary | ICD-10-CM | POA: Diagnosis not present

## 2023-11-19 LAB — GLUCOSE, CAPILLARY
Glucose-Capillary: 123 mg/dL — ABNORMAL HIGH (ref 70–99)
Glucose-Capillary: 100 mg/dL — ABNORMAL HIGH (ref 70–99)
Glucose-Capillary: 201 mg/dL — ABNORMAL HIGH (ref 70–99)
Glucose-Capillary: 98 mg/dL (ref 70–99)

## 2023-11-19 MED ORDER — SULFAMETHOXAZOLE-TRIMETHOPRIM 800-160 MG PO TABS
1.0000 | ORAL_TABLET | Freq: Two times a day (BID) | ORAL | Status: DC
  Administered 2023-11-19 – 2023-11-21 (×4): 1 via ORAL
  Filled 2023-11-19 (×4): qty 1

## 2023-11-19 MED ORDER — DICLOFENAC SODIUM 1 % EX GEL
2.0000 g | Freq: Four times a day (QID) | CUTANEOUS | 0 refills | Status: DC
  Filled 2023-11-19: qty 100, 13d supply, fill #0

## 2023-11-19 MED ORDER — AMLODIPINE BESYLATE 5 MG PO TABS
5.0000 mg | ORAL_TABLET | Freq: Every day | ORAL | 0 refills | Status: AC
  Filled 2023-11-19: qty 30, 30d supply, fill #0

## 2023-11-19 MED ORDER — HYDROCODONE-ACETAMINOPHEN 5-325 MG PO TABS
1.0000 | ORAL_TABLET | Freq: Four times a day (QID) | ORAL | 0 refills | Status: DC | PRN
  Filled 2023-11-19: qty 30, 8d supply, fill #0

## 2023-11-19 MED ORDER — SULFAMETHOXAZOLE-TRIMETHOPRIM 800-160 MG PO TABS
1.0000 | ORAL_TABLET | Freq: Two times a day (BID) | ORAL | 0 refills | Status: AC
  Filled 2023-11-19: qty 10, 5d supply, fill #0

## 2023-11-19 MED ORDER — VITAMIN D3 25 MCG PO TABS
1000.0000 [IU] | ORAL_TABLET | Freq: Every day | ORAL | 0 refills | Status: AC
  Filled 2023-11-19: qty 30, 30d supply, fill #0

## 2023-11-19 MED ORDER — ATORVASTATIN CALCIUM 40 MG PO TABS
40.0000 mg | ORAL_TABLET | Freq: Every day | ORAL | 0 refills | Status: DC
  Filled 2023-11-19: qty 30, 30d supply, fill #0

## 2023-11-19 MED ORDER — ZINC SULFATE 220 (50 ZN) MG PO TABS
220.0000 mg | ORAL_TABLET | Freq: Every day | ORAL | 0 refills | Status: DC
  Filled 2023-11-19: qty 30, 30d supply, fill #0

## 2023-11-19 MED ORDER — APIXABAN 5 MG PO TABS
5.0000 mg | ORAL_TABLET | Freq: Two times a day (BID) | ORAL | 0 refills | Status: DC
  Filled 2023-11-19: qty 60, 30d supply, fill #0

## 2023-11-19 MED ORDER — ASCORBIC ACID 500 MG PO TABS
500.0000 mg | ORAL_TABLET | Freq: Every day | ORAL | 0 refills | Status: AC
  Filled 2023-11-19: qty 30, 30d supply, fill #0

## 2023-11-19 MED ORDER — EZETIMIBE 10 MG PO TABS
10.0000 mg | ORAL_TABLET | Freq: Every day | ORAL | 0 refills | Status: AC
  Filled 2023-11-19: qty 30, 30d supply, fill #0

## 2023-11-19 MED ORDER — METFORMIN HCL 500 MG PO TABS
500.0000 mg | ORAL_TABLET | Freq: Two times a day (BID) | ORAL | 0 refills | Status: DC
  Filled 2023-11-19 – 2023-11-24 (×3): qty 60, 30d supply, fill #0

## 2023-11-19 MED ORDER — METHOCARBAMOL 500 MG PO TABS
500.0000 mg | ORAL_TABLET | Freq: Four times a day (QID) | ORAL | 0 refills | Status: DC | PRN
  Filled 2023-11-19: qty 60, 15d supply, fill #0

## 2023-11-19 MED ORDER — MELATONIN 3 MG PO TABS
3.0000 mg | ORAL_TABLET | Freq: Every day | ORAL | 0 refills | Status: DC
  Filled 2023-11-19: qty 30, 30d supply, fill #0

## 2023-11-19 MED ORDER — COLLAGENASE 250 UNIT/GM EX OINT
TOPICAL_OINTMENT | Freq: Every day | CUTANEOUS | 0 refills | Status: AC
  Filled 2023-11-19: qty 30, 30d supply, fill #0

## 2023-11-19 MED ORDER — PANTOPRAZOLE SODIUM 40 MG PO TBEC
40.0000 mg | DELAYED_RELEASE_TABLET | Freq: Two times a day (BID) | ORAL | 0 refills | Status: DC
  Filled 2023-11-19: qty 60, 30d supply, fill #0

## 2023-11-19 NOTE — Progress Notes (Signed)
 Occupational Therapy Discharge Summary  Patient Details  Name: Travis [name] MRN: [id number] Date of Birth: Oct 25, 1940  Date of Discharge from OT service:November 20, 2023    Patient has met 6 of 7 long term goals due to improved activity tolerance, improved balance, ability to compensate for deficits, functional use of  RIGHT lower extremity, and improved awareness.  Patient to discharge at overall Modified Independent level.  Patient's care partner is independent to provide the necessary physical and cognitive assistance at discharge.    Reasons goals not met: Pt continues to require Min A for BLE elevation over tub/shower edge to maintain THA precautions.   Recommendation:  Patient will benefit from ongoing skilled OT services in outpatient setting to continue to advance functional skills in the area of BADL, iADL, and Reduce care partner burden.  Equipment: [facility] & rollator  Reasons for discharge: treatment goals met and discharge from hospital  Patient/family agrees with progress made and goals achieved: Yes  OT Discharge Precautions/Restrictions  Precautions Precautions: Fall Recall of Precautions/Restrictions: Intact Precaution/Restrictions Comments: Anterior approach THA Restrictions Weight Bearing Restrictions Per Provider Order: Yes RLE Weight Bearing Per Provider Order: Weight bearing as tolerated Pain Pain Assessment Pain Scale: 0-10 Pain Score: 0-No pain ADL ADL Eating: Independent Where Assessed-Eating: Wheelchair Grooming: Modified independent Where Assessed-Grooming: Standing at sink, Sitting at sink Upper Body Bathing: Independent Where Assessed-Upper Body Bathing: Shower Lower Body Bathing: Modified independent Where Assessed-Lower Body Bathing: Shower Upper Body Dressing: Independent Where Assessed-Upper Body Dressing: Wheelchair Lower Body Dressing: Modified independent (Increased time) Where Assessed-Lower Body Dressing: Wheelchair Toileting:  Modified independent Where Assessed-Toileting: Toilet, Bedside Commode Toilet Transfer: Modified independent Statistician Method: Proofreader: Gaffer: Minimal assistance (For BLE elevation.) Tub/Shower Transfer Method: Ship Broker: Insurance Underwriter: Close supervision Film/video Editor Method: Designer, Industrial/product: Sales Promotion Account Executive Baseline Vision/History: 0 No visual deficits Patient Visual Report: No change from baseline Vision Assessment?: No apparent visual deficits Perception  Perception: Within Functional Limits Praxis Praxis: WFL Cognition Cognition Overall Cognitive Status: Within Functional Limits for tasks assessed Arousal/Alertness: Awake/alert Orientation Level: Place;Situation;Person Memory: Appears intact Awareness: Appears intact Problem Solving: Appears intact Safety/Judgment: Appears intact Brief Interview for Mental Status (BIMS) Repetition of Three Words (First Attempt): 3 Temporal Orientation: Year: Correct Temporal Orientation: Month: Accurate within 5 days Temporal Orientation: Day: Correct Recall: Sock: Yes, no cue required Recall: Blue: Yes, no cue required Recall: Bed: Yes, no cue required BIMS Summary Score: 15 Sensation Sensation Light Touch: Appears Intact Hot/Cold: Appears Intact Proprioception: Appears Intact Coordination Gross Motor Movements are Fluid and Coordinated: No Fine Motor Movements are Fluid and Coordinated: Yes Coordination and Movement Description: Deficits due to post-op weakness and pain. Improved since evaluation. Motor  Motor Motor: Other (comment) Motor - Discharge Observations: Deficits due to post-op weakness and pain. Improved since evaluation. Mobility  Transfers Sit to Stand: Independent with assistive device Stand to Sit: Independent with assistive device  Trunk/Postural  Assessment  Cervical Assessment Cervical Assessment: Exceptions to [facility] (forward head) Thoracic Assessment Thoracic Assessment: Exceptions to [facility] (rounded shoulders, thoracic kyphosis) Lumbar Assessment Lumbar Assessment: Exceptions to [facility] (posterior pelvic tilt) Postural Control Postural Control: Deficits on evaluation Righting Reactions: Decreased/delayed Protective Responses: Decreased/delayed  Balance Balance Balance Assessed: Yes Standardized Balance Assessment Standardized Balance Assessment: Timed Up and Go Test Timed Up and Go Test TUG: Normal TUG Normal TUG (seconds): 25.97 Static Sitting Balance Static Sitting - Balance Support: Feet supported;No upper  extremity supported Static Sitting - Level of Assistance: 7: Independent Dynamic Sitting Balance Dynamic Sitting - Balance Support: Feet supported;No upper extremity supported Dynamic Sitting - Level of Assistance: 6: Modified independent (Device/Increase time) Static Standing Balance Static Standing - Balance Support: Bilateral upper extremity supported;During functional activity Static Standing - Level of Assistance: 6: Modified independent (Device/Increase time) Dynamic Standing Balance Dynamic Standing - Balance Support: Bilateral upper extremity supported;During functional activity Dynamic Standing - Level of Assistance: 6: Modified independent (Device/Increase time) Extremity/Trunk Assessment RUE Assessment RUE Assessment: Within Functional Limits LUE Assessment LUE Assessment: Within Functional Limits   Nereida Habermann, OTR/L, MSOT  11/19/2023, 3:30 PM

## 2023-11-19 NOTE — Progress Notes (Signed)
 Physical Therapy Weekly Progress Note  Patient Details  Name: Travis [name] MRN: [id number] Date of Birth: 11/12/1940  Beginning of progress report period: November 10, 2023 End of progress report period: November 19, 2023  Today's Date: 11/19/2023 PT Individual Time: 9194-9154 + 1350-1445 PT Individual Time Calculation (min): 40 min + 55 min  Patient has met 3 of 3 short term goals.  Pt making excellent progress towards functional goals. Pt currently supervision/modI overall for mobility including bed mobility, transfers with rollator and gait with rollator >1000'. Pt requires CGA for ambulation with rollator over uneven surfaces. Pt completes stair negotiation with CGA with RHR and L HHA. Family education has been completed with pt's daughters who will be assisting at DC. Pt   Patient continues to demonstrate the following deficits muscle weakness and muscle joint tightness and decreased standing balance, decreased postural control, and decreased balance strategies and therefore will continue to benefit from skilled PT intervention to increase functional independence with mobility.  Patient progressing toward long term goals..  Continue plan of care.  PT Short Term Goals Week 1:  PT Short Term Goal 1 (Week 1): Pt will complete bed mobility with supervision PT Short Term Goal 1 - Progress (Week 1): Met PT Short Term Goal 2 (Week 1): Pt will complete transfers wtih CGA PT Short Term Goal 2 - Progress (Week 1): Met PT Short Term Goal 3 (Week 1): Pt will complete gait 150' with LRAD CGA PT Short Term Goal 3 - Progress (Week 1): Met Week 2:  PT Short Term Goal 1 (Week 2): STG = LTG due to ELOS  Skilled Therapeutic Interventions/Progress Updates:    Pt presents in room in [facility], agreeable to PT. Pt denies pain. Session focused on therapeutic activities for upright tolerance as well as NMR for BLE strengthening and dynamic standing balance. Pt completes transfers with supervision/modI with rollator  throughout session. Pt ambulates with rollator with supervision/modI >150' to main gym. Pt ambulates without device with CGA 100' demonstrating antalgic gait pattern initially with decreased stance time RLE at first that improves with increased distance ambulated. Pt completes NMR in standing with BUE support on //bars including: - resisted monster walks red band 6x6' - resisted side stepping red band 2x6' bilaterally - resisted marching red band around forefoot 2x20 Pt returns to room and remains seated in [facility] with all needs within reach, cal light in place and chair alarm activated at end of session.   SESSION 2: Pt presents in room seated in WC, agreeable to PT. Pt denies pain this session. Session focused on gait training for stair negotiation and NMR for dynamic standing balance, single limb stability and BLE muscle fiber recruitment. Pt completes transfers with modI and rollator throughout session. Pt ambulates modI from room to day room. Pt completes up/down 12 steps in stairwell with RHR and L HHA ascending, R HHA and LHR descending to simulate home environment, requires CGA. Pt ambulates to day room and comes to sitting on EOM. Pt completes NMR in standing without UE support including: - heel taps 4 step x20 alternating BLE - step ups 4 step x10 BLE (L HHA for R step up with min assist for postural stability) - forward step/back together x10 BLE - lateral step/back together x10 BLE - standing on kinetron stair stepping with BUE support on handles interval training 30 sec work/30 sec rest x10 minutes total Pt provided with seated rest breaks between all gait trials and exercises to promote energy conservation and  quality with tasks. Pt educated on DOMS with pt verbalizing understanding.  Pt returns to room ambulating with rollator with modI and remains seated in [facility] with all needs within reach, cal light in place and chair alarm donned and activated at end of session.    Therapy  Documentation Precautions:  Precautions Precautions: Fall Recall of Precautions/Restrictions: Intact Precaution/Restrictions Comments: Anterior approach THA Restrictions Weight Bearing Restrictions Per Provider Order: Yes RLE Weight Bearing Per Provider Order: Weight bearing as tolerated   Therapy/Group: Individual Therapy  Reche Ohara PT, DPT 11/19/2023, 4:01 PM

## 2023-11-19 NOTE — Progress Notes (Signed)
 Patient ID: Travis [name], male   DOB: 04-26-1940, 83 y.o.   MRN: [id number]  SW received updates from [employer] reporting insurance will not allow a rollator and 3in1 BSC to be purchased together.   [id number] called pt daughter [name] and [name] to inform on above.  *SW later received call from pt dtr reporting she spoke with [employer] and he has 3in1 BSC already.   *SW later went by pt room to confirm DME; pt not in room and in therapy at time of visit.    Graeme Jude, MSW, LCSW Office: [phone] Cell: [phone] Fax: [phone]

## 2023-11-19 NOTE — Progress Notes (Signed)
 Physical Therapy Discharge Summary  Patient Details  Name: Travis [name] MRN: [id number] Date of Birth: January 17, 1940  Date of Discharge from PT service:November 21, 2023  Today's Date: 11/20/2023 PT Individual Time: 1000-1045 PT Individual Time Calculation (min): 45 min    Patient has met 8 of 8 long term goals due to improved activity tolerance, improved balance, improved postural control, increased strength, and decreased pain.  Patient to discharge at an ambulatory level Modified Independent.   Patient's care partner is independent to provide the necessary physical assistance at discharge.  Reasons goals not met: N/A  Recommendation:  Patient will benefit from ongoing skilled PT services in outpatient setting to continue to advance safe functional mobility, address ongoing impairments in gait mechanics, dynamic standing balance, BLE strengthening, and minimize fall risk.  Equipment: rollator  Reasons for discharge: treatment goals met and discharge from hospital  Patient/family agrees with progress made and goals achieved: Yes  PT Discharge Precautions/Restrictions Precautions Precautions: Fall Recall of Precautions/Restrictions: Intact Precaution/Restrictions Comments: Anterior approach THA Restrictions Weight Bearing Restrictions Per Provider Order: Yes RLE Weight Bearing Per Provider Order: Weight bearing as tolerated Pain Interference Pain Interference Pain Effect on Sleep: 2. Occasionally Pain Interference with Therapy Activities: 1. Rarely or not at all Pain Interference with Day-to-Day Activities: 1. Rarely or not at all Vision/Perception  Vision - History Ability to See in Adequate Light: 0 Adequate Perception Perception: Within Functional Limits Praxis Praxis: WFL  Cognition Overall Cognitive Status: Within Functional Limits for tasks assessed Arousal/Alertness: Awake/alert Memory: Appears intact Awareness: Appears intact Problem Solving: Appears  intact Safety/Judgment: Appears intact Sensation Sensation Light Touch: Appears Intact Hot/Cold: Appears Intact Proprioception: Appears Intact Coordination Gross Motor Movements are Fluid and Coordinated: No Fine Motor Movements are Fluid and Coordinated: Yes Coordination and Movement Description: Deficits due to post-op weakness and pain. Improved since evaluation. Motor  Motor Motor: Other (comment) Motor - Discharge Observations: Deficits due to post-op weakness and pain. Improved since evaluation.  Mobility Bed Mobility Bed Mobility: Sit to Supine;Supine to Sit Supine to Sit: Independent with assistive device Sit to Supine: Independent with assistive device Transfers Transfers: Stand to Sit;Sit to Stand Sit to Stand: Independent with assistive device Stand to Sit: Independent with assistive device Stand Pivot Transfers: Independent with assistive device Transfer (Assistive device): Rollator Locomotion  Gait Ambulation: Yes Gait Assistance: Independent with assistive device Gait Distance (Feet): 300 Feet Assistive device: Rollator Gait Gait: Yes Gait Pattern: Impaired Gait Pattern: Step-to pattern;Trunk flexed;Poor foot clearance - right;Decreased step length - right Stairs / Additional Locomotion Stairs: Yes Stairs Assistance: Contact Guard/Touching assist Stair Management Technique: One rail Left;Step to pattern Number of Stairs: 12 Height of Stairs: 6 Ramp: Contact Guard/touching assist Curb: Supervision/Verbal cueing Pick up small object from the floor assist level: Supervision/Verbal cueing Wheelchair Mobility Wheelchair Mobility: No  Trunk/Postural Assessment  Cervical Assessment Cervical Assessment: Exceptions to [facility] (forward head) Thoracic Assessment Thoracic Assessment: Exceptions to [facility] (rounded shoulders, thoracic kyphosis) Lumbar Assessment Lumbar Assessment: Exceptions to [facility] (posterior pelvic tilt) Postural Control Postural Control: Deficits on  evaluation Righting Reactions: Decreased/delayed Protective Responses: Decreased/delayed  Balance Balance Balance Assessed: Yes Standardized Balance Assessment Standardized Balance Assessment: Timed Up and Go Test Timed Up and Go Test TUG: Normal TUG Normal TUG (seconds): 25.97 Static Sitting Balance Static Sitting - Balance Support: Feet supported;No upper extremity supported Static Sitting - Level of Assistance: 7: Independent Dynamic Sitting Balance Dynamic Sitting - Balance Support: Feet supported;No upper extremity supported Dynamic Sitting - Level of Assistance: 6:  Modified independent (Device/Increase time) Static Standing Balance Static Standing - Balance Support: Bilateral upper extremity supported;During functional activity Static Standing - Level of Assistance: 6: Modified independent (Device/Increase time) Dynamic Standing Balance Dynamic Standing - Balance Support: Bilateral upper extremity supported;During functional activity Dynamic Standing - Level of Assistance: 6: Modified independent (Device/Increase time) Extremity Assessment  RUE Assessment RUE Assessment: Within Functional Limits LUE Assessment LUE Assessment: Within Functional Limits RLE Assessment RLE Assessment: Exceptions to [facility] General Strength Comments: grossly 4/5, limited slightly by pain LLE Assessment LLE Assessment: Within Functional Limits General Strength Comments: 5/5  Session Note: Chart reviewed and pt agreeable to therapy. Pt received seated in WC with 0/10 c/o pain. Session focused on functional transfers, balance, ambulation, and evaluation of functional mobility to promote safe home mobility and access an preparation for pending d/c. Pt initiated session with review of standing transfers and amb as noted above. PT then retrieved and reviewed basic standing exercises for HEP with pt verbalizing understanding.Pt then completed amb of 27ft with modI + rollator, but c/o of high nausea and  returned to room. In room, pt vomiting and RN made aware. Pt missed 30 mins PT 2/2 nausea + vomiting. Session education emphasized importance of continuing PT after dc. At end of session, pt was left seated in [facility] with alarm engaged, nurse call bell and all needs in reach.     Khristy Kalan G Shanielle Correll PT, DPT 11/20/2023, 10:49 AM

## 2023-11-19 NOTE — Progress Notes (Signed)
 Nutrition Follow-up  DOCUMENTATION CODES:   Non-severe (moderate) malnutrition in context of social or environmental circumstances  INTERVENTION:  Continue regular diet. Continue Glucerna Shake BID. Each supplement provides 220 Kcals and 10 grams of protein. Continue Juven BID to support wound healing. Each packet provides 95 calories, 2.5 grams of protein (collagen), and 9.8 grams of carbohydrate (3 grams sugar); also contains 7 grams of L-arginine and L-glutamine, 300 mg vitamin C, 15 mg vitamin E, 1.2 mcg vitamin B-12, 9.5 mg zinc , 200 mg calcium , and 1.5 g Calcium  Beta-hydroxy-Beta-methylbutyrate.  NUTRITION DIAGNOSIS:   Moderate Malnutrition related to social / environmental circumstances (decreased appetite with aging) as evidenced by severe muscle depletion, moderate fat depletion. - still applicable, improving   GOAL:   Patient will meet greater than or equal to 90% of their needs - currently being met, continue to follow  MONITOR:   PO intake, Supplement acceptance  REASON FOR ASSESSMENT:   Follow-up for: Consult Wound healing  ASSESSMENT:   Pt with hx of sick sinus syndrome status post pacemaker/paroxysmal A-fib on Eliquis , CAD, CKD stage IIIa, diabetes mellitus type 2, glaucoma, recent fall leading to R hip fx s/p total hip arthoplasty (10/19). Recent admission 10/18-10/21 and 10/24-10/28 related to hip pain which lead to decreased PO intake and dehydration that caused an AKI requiring IV fluid administration. Admitted to CIR for comprehensive rehab.  Visited the patient who confirms that he is feeling much better and eating much better. His appetite has greatly improved. He continues to drink Glucerna and Juven and denies any questions.  Scheduled Meds:  apixaban   5 mg Oral BID   ascorbic acid  500 mg Oral Daily   atorvastatin   40 mg Oral Daily   brimonidine   1 drop Both Eyes BID   cholecalciferol  1,000 Units Oral Daily   collagenase   Topical Daily    diclofenac Sodium  2 g Topical QID   ezetimibe   10 mg Oral Daily   feeding supplement (GLUCERNA SHAKE)  237 mL Oral BID BM   insulin aspart  0-15 Units Subcutaneous TID WC   melatonin  3 mg Oral QHS   metFORMIN   500 mg Oral BID WC   methocarbamol  500 mg Oral TID   nutrition supplement (JUVEN)  1 packet Oral BID BM   pantoprazole  40 mg Oral BID AC   polyethylene glycol  17 g Oral Daily   senna-docusate  2 tablet Oral BID   zinc  sulfate (50mg  elemental zinc )  220 mg Oral Daily    Diet Order             Diet regular Room service appropriate? Yes with Assist; Fluid consistency: Thin  Diet effective now                  Meal Intake: 75-100% consistently the past 2 days  Labs:     Latest Ref Rng & Units 11/18/2023    5:02 AM 11/16/2023    4:50 AM 11/15/2023    5:15 AM  CMP  Glucose 70 - 99 mg/dL 885  876  871   BUN 8 - 23 mg/dL 28  33  34   Creatinine 0.61 - 1.24 mg/dL 8.82  8.76  8.80   Sodium 135 - 145 mmol/L 136  134  135   Potassium 3.5 - 5.1 mmol/L 3.8  3.9  3.6   Chloride 98 - 111 mmol/L 106  105  103   CO2 22 - 32 mmol/L 22  21  21   Calcium  8.9 - 10.3 mg/dL 9.1  9.1  9.4     I/O: +1 L since admit  NUTRITION - FOCUSED PHYSICAL EXAM:  Flowsheet Row Most Recent Value  Orbital Region Moderate depletion  Upper Arm Region Moderate depletion  Thoracic and Lumbar Region Moderate depletion  Buccal Region Moderate depletion  Temple Region Mild depletion  Clavicle Bone Region Severe depletion  Clavicle and Acromion Bone Region Severe depletion  Scapular Bone Region Severe depletion  Dorsal Hand Severe depletion  Patellar Region Severe depletion  Anterior Thigh Region Severe depletion  Posterior Calf Region Unable to assess  [in boots]  Edema (RD Assessment) Unable to assess  Hair Reviewed  Eyes Reviewed  Mouth Reviewed  Skin Reviewed  Nails Reviewed    EDUCATION NEEDS:   Education needs have been addressed  Skin:  Skin Assessment: Skin Integrity  Issues: Skin Integrity Issues:: DTI, Unstageable DTI: L heel, R toe Unstageable: coccyx Incisions: R hip  Last BM:  11/3 type 4  Height:   Ht Readings from Last 1 Encounters:  11/09/23 5' 11 (1.803 m)    Weight:    Ideal Body Weight:  78.18 kg  BMI:  Body mass index is 20.92 kg/m.  Estimated Nutritional Needs:  Kcal:  1700-1900 Protein:  70-90g Fluid:  1.7-1.9L    Travis Ruth, MS, RDN, LDN [place] See AMION for contact information

## 2023-11-19 NOTE — Plan of Care (Signed)
  Problem: Consults Goal: RH GENERAL PATIENT EDUCATION Description: See Patient Education module for education specifics. Outcome: Progressing Goal: Nutrition Consult-if indicated Outcome: Progressing Goal: Diabetes Guidelines if Diabetic/Glucose > 140 Description: If diabetic or lab glucose is > 140 mg/dl - Initiate Diabetes/Hyperglycemia Guidelines & Document Interventions  Outcome: Progressing   Problem: RH BOWEL ELIMINATION Goal: RH STG MANAGE BOWEL WITH ASSISTANCE Description: STG Manage Bowel with mod I Assistance. Outcome: Progressing Goal: RH STG MANAGE BOWEL W/MEDICATION W/ASSISTANCE Description: STG Manage Bowel with Medication with mod I Assistance. Outcome: Progressing   Problem: RH SAFETY Goal: RH STG ADHERE TO SAFETY PRECAUTIONS W/ASSISTANCE/DEVICE Description: STG Adhere to Safety Precautions With cues Assistance/Device. Outcome: Progressing   Problem: RH PAIN MANAGEMENT Goal: RH STG PAIN MANAGED AT OR BELOW PT'S PAIN GOAL Description: Pain < 4 with prns Outcome: Progressing   Problem: RH KNOWLEDGE DEFICIT GENERAL Goal: RH STG INCREASE KNOWLEDGE OF SELF CARE AFTER HOSPITALIZATION Description: Patient and dtr will be able to manage care at discharge using educational resources for medications and dietary modification independently Outcome: Progressing   Problem: Education: Goal: Ability to describe self-care measures that may prevent or decrease complications (Diabetes Survival Skills Education) will improve Outcome: Progressing Goal: Individualized Educational Video(s) Outcome: Progressing   Problem: Coping: Goal: Ability to adjust to condition or change in health will improve Outcome: Progressing   Problem: Fluid Volume: Goal: Ability to maintain a balanced intake and output will improve Outcome: Progressing   Problem: Health Behavior/Discharge Planning: Goal: Ability to identify and utilize available resources and services will improve Outcome:  Progressing Goal: Ability to manage health-related needs will improve Outcome: Progressing   Problem: Metabolic: Goal: Ability to maintain appropriate glucose levels will improve Outcome: Progressing   Problem: Nutritional: Goal: Maintenance of adequate nutrition will improve Outcome: Progressing Goal: Progress toward achieving an optimal weight will improve Outcome: Progressing   Problem: Skin Integrity: Goal: Risk for impaired skin integrity will decrease Outcome: Progressing   Problem: Tissue Perfusion: Goal: Adequacy of tissue perfusion will improve Outcome: Progressing

## 2023-11-19 NOTE — Progress Notes (Addendum)
 Inpatient Rehabilitation Care Coordinator Discharge Note   Patient Details  Name: Travis [name] MRN: [id number] Date of Birth: 1940/01/22   Discharge location: D/c to home  Length of Stay: 11 days  Discharge activity level: Supervision  Home/community participation: Limited  Patient response un:Yzjouy Literacy - How often do you need to have someone help you when you read instructions, pamphlets, or other written material from your doctor or pharmacy?: Rarely  Patient response un:Dnrpjo Isolation - How often do you feel lonely or isolated from those around you?: Rarely  Services provided included: MD, RD, PT, OT, RN, CM, TR, Pharmacy, Neuropsych, SW  Financial Services:  Field Se[employer]ologist Utilized: Medicare    Choices offered to/list presented to: patient and family  Follow-up services arranged:  Patient/Family has no preference for HH/DME agencies, Outpatient, DME    Outpatient Servicies: [facility] for PT/OT DME : [employer] for rollator and TTB    Patient response to transportation need: Is the patient able to respond to transportation needs?: Yes In the past 12 months, has lack of transportation kept you from medical appointments or from getting medications?: No In the past 12 months, has lack of transportation kept you from meetings, work, or from getting things needed for daily living?: No   Patient/Family verbalized understanding of follow-up arrangements:  Yes  Individual responsible for coordination of the follow-up plan: contact pt or pt dtr [name]  Confirmed correct DME delivered: Graeme DELENA Jude 11/19/2023    Comments (or additional information):fam edu completed  Summary of Stay    Date/Time Discharge Planning CSW  11/16/23 0956 Pt wil discharge to home with PRN support from his daughter if he goes home, and has the optionto go to his daughter's home but more intermittent support as she has other obligations. SW will confirm  there are no barriers to discharge. AAC       Termaine Roupp A Jude

## 2023-11-19 NOTE — Consult Note (Signed)
 WOC Nurse wound follow up Requested a re-assessment and update the dressing orders. Wound type: Pressure injuries  1 - Unstageable PI on coccyx Measurement: see flowsheet (pt was sitting in the wheelchair, evaluated the wound in photo). Wound bed: 100% yellow/green slough with malodor. Drainage (amount, consistency, odor)  Periwound: Dressing procedure/placement/frequency: Cleanse with Vashe #[id number], not rinse. Apply Santyl to the wound bed, top with foam dressing. It is ok to lift and reapply the Santyl.  2 - L heel with open area - partial thickness. Previous classified as DTPI Measurement: 1 cm x 0.3 cm x 0.1 cm Wound bed: 100% dark red Drainage (amount, consistency, odor) Minimum amount, no odor, serous. Periwound: intact Dressing procedure/placement/frequency: Cleanse with saline, pat dry. Apply a single layer of Xeroform Soila [phone]) to wound bed, top with foam dressing. It is ok to lift and reapply the Xeroform.  3 - Top of great toe - partial thickness Measurement: 1.0 cm x 0.8 cm x 0.1 cm Wound bed: 100% red Drainage (amount, consistency, odor) Minimum amount, no odor, serous. Periwound: intact Dressing procedure/placement/frequency: Cleanse with saline, pat dry. Apply a single layer of Xeroform Soila [phone]) to wound bed, top with foam dressing. It is ok to lift and reapply the Xeroform.  Discussed with bed nurse about the recommendations.  WOC team will not plan to follow further. Please reconsult if further assistance is needed. Thank-you,  Lela Holm MSN, RN, CNS.  (Phone [phone])

## 2023-11-19 NOTE — Progress Notes (Signed)
 Spoke with daughter [name] to come in for wound care education.  Per daughter she will come in early Sunday for education and return demonstration of wound care. Home supplies in room.

## 2023-11-19 NOTE — Telephone Encounter (Signed)
 Patient still in current rehab admission. Closing until he gets out and he will get paperwork to us 

## 2023-11-19 NOTE — Progress Notes (Signed)
 PROGRESS NOTE   Subjective/Complaints: Vital stable.  No events overnight. Patient feeling well today, prepared for discharge this weekend.  No questions, concerns.  Feels stiffness in his leg from yesterday is much improved. Nursing notes he will wound has opened up, and some foul odor from sacral pressure wound.  WOC evaluated, made recommendations, advising odor/drainage likely secondary to superficial wound bed infection.  Patient asymptomatic, afebrile.  ROS: Denies fevers, HA, chills, N/V, abdominal pain, constipation, diarrhea, SOB, cough, chest pain, new weakness or paraesthesias, mood change.   Stiffness in right hip/knee  Objective:   DG FEMUR, MIN 2 VIEWS RIGHT Result Date: 11/18/2023 EXAM: 2 VIEW(S) XRAY OF THE RIGHT FEMUR 11/18/2023 11:15:00 AM COMPARISON: None available. CLINICAL HISTORY: Hip pain FINDINGS: BONES AND JOINTS: Right total hip arthroplasty noted. Moderate enthesopathic changes of the inferior patella. SOFT TISSUES: Atherosclerotic vascular calcifications. IMPRESSION: 1. Right total hip arthroplasty. 2. Moderate enthesopathic changes of the inferior patella. Electronically signed by: Norleen Boxer MD 11/18/2023 12:24 PM EST RP Workstation: [id number]    Recent Labs    11/18/23 0502  WBC 8.1  HGB 8.9*  HCT 25.9*  PLT 352    Recent Labs    11/18/23 0502  NA 136  K 3.8  CL 106  CO2 22  GLUCOSE 114*  BUN 28*  CREATININE 1.17  CALCIUM  9.1     Intake/Output Summary (Last 24 hours) at 11/19/2023 0859 Last data filed at 11/19/2023 0730 Gross per 24 hour  Intake 533 ml  Output 375 ml  Net 158 ml     Wound 11/09/23 1600 Pressure Injury Coccyx Mid Unstageable - Full thickness tissue loss in which the base of the injury is covered by slough (yellow, tan, gray, green or brown) and/or eschar (tan, brown or black) in the wound bed. (Active)     Wound 11/09/23 1600 Pressure Injury Heel Left Deep Tissue  Pressure Injury - Purple or maroon localized area of discolored intact skin or blood-filled blister due to damage of underlying soft tissue from pressure and/or shear. (Active)    Physical Exam: Vital Signs Blood pressure 115/61, pulse 80, temperature 97.7 F (36.5 C), temperature source Oral, resp. rate 18, height 5' 11 (1.803 m), weight 68 kg, SpO2 96%.  Constitutional: No apparent distress. Appropriate appearance for age.  Sitting up in therapy gym. HENT: No JVD. Neck Supple. Trachea midline. Atraumatic, normocephalic. Eyes: PERRLA. Cardiovascular: RRR, no murmurs/rub/gallops.  1+ bilateral peripheral edema. Respiratory: CTAB. No rales, rhonchi, or wheezing. On RA.  Abdomen: + bowel sounds, normoactive. No distention or tenderness.  Skin: C/D/I.  + R hip Surgical site with minimal drainage--stable + R elbow skin tear - c/d/I.  + blancheable erythema bilateral 1st toes and heels--covered in mepilex; toe and heel now slightly open. + Sacral wound-with some green drainage in wound bed today, slough adhered to bed        MSK:      No apparent deformity. + Tightness in right hamstrings, improved from last exam.  Mild pain behind the knee, extending up into the right hip with range of motion.  Neurologic exam:  Cognition: AAO to person, place, time and event.   Insight: Good  insight into current condition.  Mood: Pleasant affect, appropriate mood.   Strength: UEs 5-/5 throughout LLE 5-/5 throughout RLE-4/5 in HF, 5-/5KE, KF, 5/5 distally   Assessment/Plan: 1. Functional deficits which require 3+ hours per day of interdisciplinary therapy in a comprehensive inpatient rehab setting. Physiatrist is providing close team supervision and 24 hour management of active medical problems listed below. Physiatrist and rehab team continue to assess barriers to discharge/monitor patient progress toward functional and medical goals  Care Tool:  Bathing    Body parts bathed by  patient: Right arm, Left arm, Chest, Abdomen, Front perineal area, Buttocks, Right upper leg, Left upper leg, Face, Right lower leg, Left lower leg   Body parts bathed by helper: Right lower leg, Left lower leg     Bathing assist Assist Level: Supervision/Verbal cueing     Upper Body Dressing/Undressing Upper body dressing   What is the patient wearing?: Pull over shirt    Upper body assist Assist Level: Set up assist    Lower Body Dressing/Undressing Lower body dressing      What is the patient wearing?: Underwear/pull up, Pants     Lower body assist Assist for lower body dressing: Minimal Assistance - Patient > 75% (Using AE.)     Toileting Toileting    Toileting assist Assist for toileting: Supervision/Verbal cueing     Transfers Chair/bed transfer  Transfers assist     Chair/bed transfer assist level: Supervision/Verbal cueing     Locomotion Ambulation   Ambulation assist      Assist level: Supervision/Verbal cueing Assistive device: Rollator Max distance: 200'   Walk 10 feet activity   Assist     Assist level: Supervision/Verbal cueing Assistive device: Rollator   Walk 50 feet activity   Assist    Assist level: Supervision/Verbal cueing Assistive device: Rollator    Walk 150 feet activity   Assist Walk 150 feet activity did not occur: Safety/medical concerns (nausea with 100' walk)  Assist level: Supervision/Verbal cueing Assistive device: Rollator    Walk 10 feet on uneven surface  activity   Assist Walk 10 feet on uneven surfaces activity did not occur: Safety/medical concerns   Assist level: Contact Guard/Touching assist Assistive device: Rollator   Wheelchair     Assist Is the patient using a wheelchair?: Yes Type of Wheelchair: Manual    Wheelchair assist level: Dependent - Patient 0%      Wheelchair 50 feet with 2 turns activity    Assist        Assist Level: Dependent - Patient 0%   Wheelchair  150 feet activity     Assist      Assist Level: Dependent - Patient 0%   Blood pressure 115/61, pulse 80, temperature 97.7 F (36.5 C), temperature source Oral, resp. rate 18, height 5' 11 (1.803 m), weight 68 kg, SpO2 96%.  Medical Problem List and Plan: 1. 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  Weightbearing as tolerated             -patient may  shower-cover surgical dressing and skin tears             -ELOS/Goals: 12-14 days- supervision - 11/9 DC             - Stable to continue CIR   - 11/4: Lives alone, needs to be IND for home because PRN support. SPV PT up to 250 ft with walker, Setup UP,  Min A LB due to hip stiffness when getting pants on. Needs occassional cueing for recall. Will need BSC.   2.  Antithrombotics: -DVT/anticoagulation:  Pharmaceutical: Eliquis              -antiplatelet therapy: N/A 3. Pain Management: Robaxin 500 mg 3 times daily, hydrocodone 5-325 mg 1 tablet every 6 hours as needed  - Pain well-controlled on current regimen 4. Mood/Behavior/Sleep: Melatonin 3 mg nightly             -antipsychotic agents: N/A 5. Neuropsych/cognition: This patient is capable of making decisions on his own behalf. 6. Skin/Wound Care: Routine skin checks   - 10/29: L heel and b/l 1st toe DTIs; add prevalon boots when in bed  - 10/31: WOC consult for sacral unstageable, left heel/toe, and right arm eschar.  Cleanse coccyx/buttocks wound with Vashe, do not rinse and allow to air dry. Apply 1/4 thick layer of Santyl to wound bed, top with saline moist gauze, dry gauze and silicone foam.  May lift foam daily to replace Santyl, change foam q3 days and prn soiling.  Cleanse L heel and L great toe with soap and water, dry and apply Xeroform gauze Soila [phone]) every other day to purple discoloration.  Place L foot in Prevalon boot to offload pressure.  Cleanse R arm wound with Vashe,  apply Xeroform gauze to wound bed daily and secure with silicone foam or Kerlix roll gauze whichever is preferred.  Soak dressing with NS if adhered to wound bed for atraumatic removal.  11-3: Has been 2 weeks postop, will message Dr. Ernie regarding removal of dressing--cleared by Rosina Calin, PA to remove dressing 1 11-7: WOC with wound follow-up, sacrum cleanse with Vashe and apply Santyl to wound bed with foam dressing over top; Xeroform to left heel; Xeroform to left first toe.  Due to concerns for superficial wound bed infection of the sacrum, will start Bactrim 800 mg twice daily for 5 days.  7. Fluids/Electrolytes/Nutrition: Routine in and outs with follow-up chemistries  - Admission labs stable  11-7: BMP, CBC in a.m. to monitor for recurrent AKI with resumption of metformin   8.  Acute blood loss anemia.  Transfused 1 unit packed red blood cells follow-up CBC  - Hemoglobin improved to 11 on admission labs; monitor  -11-3: Hemoglobin down to 9.0.  Elevated BUN indicating possible GI etiology.  Get FOBT, repeat H&H to confirm.  Adding Protonix 40 mg twice daily.--Repeat stable, 9.8.  - 11-6: FOBT negative.  9.  Hypotension.  Norvasc  5 mg daily.  HCTZ 12.5 mg daily and lisinopril  20 mg daily currently on hold and resume as needed.  Monitor with increased mobility  - 11-4: Some peripheral edema, BP soft.  DC Norvasc .--Monitor, asymptomatic    11/19/2023    4:05 AM 11/18/2023    7:31 PM 11/18/2023    5:15 PM  Vitals with BMI  Systolic 115 108 873  Diastolic 61 57 63  Pulse 80 82 83  -11/1-2 BP soft but stable, continue to monitor   10.  Hyperlipidemia.  Lipitor/Zetia  11.  Constipation.  MiraLAX daily prn per pt request, Senokot-S 1 tablet twice daily  - Monitor today, and may need sorbitol  - 10-30: Sincrease sennakot to 2 tabs BID  - 10-31: No BMs recorded; will resume daily MiraLAX  -11/5 LBM  --small    12.  Diabetes mellitus.  SSI.  Hemoglobin A1c 6.2  - Blood sugars  well-controlled, monitor Recent Labs    11/18/23  2039 11/19/23 0614 11/19/23 1202  GLUCAP 275* 123* 100*  - 11/1 CBGs controlled, continue to monitor - 11/2 Fair control, continue to monitor  - 11-3: Consistently high blood sugars at nighttime.  However, cautious to add nightly insulin given a.m. lows.  Hemoglobin A1c is at goal for age, resume home metformin  500 mg twice daily. 11-4: Creatinine tolerating metformin , blood sugars improved.  Monitor on current regimen.  13.  CAD/permanent pacemaker/ventricular tachycardia- Afib s/p conversion 6 weeks ago .  Continue Eliquis .  Follow-up cardiology services Dr.Crotoru  - 11-3: Creatinine now in range to resume Eliquis  at 5 mg twice daily dosing--> resumed 11-6 due to stable hemoglobin  -11-7: Labs in a.m. to monitor hemoglobin with full dose Eliquis  resumption   14.  AKI on CKD3A.  Cr got to  2.5- down to 1.3 Received gentle IV fluids.  Follow-up chemistries  - Admission labs with creatinine 1.3; trend  -BMP tomorrow--creatinine improved, BUN uptrending as above.    - 11-4: Stable  11-6: Improving  15. Glaucoma- con't drops  16.  Thrombocytosis.  Likely reactive, trend.  - Resolved  17.  Right hamstring tightnes/stiffness  - 11-6 femur x-ray shows hardware intact, mild patellar arthritis  - Add heat pack as needed the posterior leg  - Voltaren gel 4 times daily to the knee and hamstrings  11-7: Symptoms improved  LOS: 10 days A FACE TO FACE EVALUATION WAS PERFORMED  Travis [name] Likes 11/19/2023, 8:59 AM

## 2023-11-19 NOTE — Progress Notes (Signed)
 Occupational Therapy Session Note  Patient Details  Name: Travis [name] MRN: [id number] Date of Birth: [birth year]  Today's Date: 11/19/2023 OT Individual Time: 9094-9054 OT Individual Time Calculation (min): 40 min   Short Term Goals: Week 2:  OT Short Term Goal 1 (Week 2): STGs=LTGs due to patient's estimated length of stay.  Skilled Therapeutic Interventions/Progress Updates:  Pt greeted sitting in [facility] for skilled OT session with focus on functional transfers and discharge planning.   Pain: Pt with un-rated pain at surgical site, reporting much improved sleep quality with handout provided to nursing. OT offering intermediate rest breaks and positioning suggestions throughout session to address pain/fatigue and maximize participation/safety in session.   Functional Transfers: Ambulation with supervision + rollator. Tub/shower transfer completed with Min A to elevate BLE at the same time in order to maintain anterior THA precautions. Handout provided with instruction for daughters.   Self Care Tasks: Pt simulates simple meal prepping with supervision + education provided on using rollator seat to carry items. Min cuing for safe rollator placement and brake management. Bed mobility reviewed with emphasis placed on precaution adherence by entering bed on the R-side, scooting across to the L-side, and then exiting, all to avoid excessive RLE abduction.   Pt remained sitting WC with 4Ps assessed and immediate needs met. Pt continues to be appropriate for skilled OT intervention to promote further functional independence in ADLs/IADLs.   Therapy Documentation Precautions:  Precautions Precautions: Fall Recall of Precautions/Restrictions: Impaired Precaution/Restrictions Comments: Unable to recall anterior approach precautions. Restrictions Weight Bearing Restrictions Per Provider Order: Yes RLE Weight Bearing Per Provider Order: Weight bearing as tolerated   Therapy/Group: Individual  Therapy  Nereida Habermann, OTR/L, MSOT  11/19/2023, 6:51 AM

## 2023-11-19 NOTE — Group Note (Signed)
 Patient Details Name: EUFEMIO STRAHM MRN: [id number] DOB: [birth year] Today's Date: 11/19/2023  Time Calculation:   PT Group Time Calculation PT Group Start Time: 1100 PT Group Stop Time: 1200 PT Group Time Calculation (min): 60 min    Group Description: BUE Therex Group: Pt participated in group session with a focus on BUE strength and endurance to facilitate improved activity tolerance and strength for higher level BADLs and functional mobility tasks.   Individual level documentation: Pt first in engaged in seated warm up where pt completed neck and shoulder ROM.  Next, pt completed seated bicep curls, shoulder flexion/extension, forward punches, and upright rows with 1lb weight. Patient able to complete 10-20 reps reps. Participated in dice game where pt worked on throwing inflatable dice to pick activity. Rest breaks provided as needed during session   Ended session with guide deep breathing for 3 mins. Discussed benefits of deep breathing to manage stress and pain. Pt transported back to room dependently by rehab tech. Pt left in w/c with all needs within reach and bed alarm/chair alarm activated.   Pain: Pain Assessment Pain Scale: 0-10 Pain Score: 0-No pain  Precautions:     Dyanara Cozza 11/19/2023, 12:55 PM

## 2023-11-20 ENCOUNTER — Other Ambulatory Visit: Payer: Self-pay

## 2023-11-20 ENCOUNTER — Other Ambulatory Visit (HOSPITAL_COMMUNITY): Payer: Self-pay

## 2023-11-20 DIAGNOSIS — K5901 Slow transit constipation: Secondary | ICD-10-CM

## 2023-11-20 DIAGNOSIS — S72001D Fracture of unspecified part of neck of right femur, subsequent encounter for closed fracture with routine healing: Secondary | ICD-10-CM | POA: Diagnosis not present

## 2023-11-20 DIAGNOSIS — D62 Acute posthemorrhagic anemia: Secondary | ICD-10-CM | POA: Diagnosis not present

## 2023-11-20 DIAGNOSIS — R739 Hyperglycemia, unspecified: Secondary | ICD-10-CM | POA: Diagnosis not present

## 2023-11-20 DIAGNOSIS — I10 Essential (primary) hypertension: Secondary | ICD-10-CM | POA: Diagnosis not present

## 2023-11-20 LAB — BASIC METABOLIC PANEL WITH GFR
Anion gap: 10 (ref 5–15)
BUN: 27 mg/dL — ABNORMAL HIGH (ref 8–23)
CO2: 20 mmol/L — ABNORMAL LOW (ref 22–32)
Calcium: 9.2 mg/dL (ref 8.9–10.3)
Chloride: 105 mmol/L (ref 98–111)
Creatinine, Ser: 1.21 mg/dL (ref 0.61–1.24)
GFR, Estimated: 59 mL/min — ABNORMAL LOW (ref >60)
Glucose, Bld: 107 mg/dL — ABNORMAL HIGH (ref 70–99)
Potassium: 4 mmol/L (ref 3.5–5.1)
Sodium: 135 mmol/L (ref 135–145)

## 2023-11-20 LAB — CBC WITH DIFFERENTIAL/PLATELET
Abs Immature Granulocytes: 0.04 K/uL (ref 0.00–0.07)
Basophils Absolute: 0 K/uL (ref 0.0–0.1)
Basophils Relative: 0 %
Eosinophils Absolute: 0.1 K/uL (ref 0.0–0.5)
Eosinophils Relative: 2 %
HCT: 24.4 % — ABNORMAL LOW (ref 39.0–52.0)
Hemoglobin: 8.4 g/dL — ABNORMAL LOW (ref 13.0–17.0)
Immature Granulocytes: 1 %
Lymphocytes Relative: 15 %
Lymphs Abs: 1.2 K/uL (ref 0.7–4.0)
MCH: 33.1 pg (ref 26.0–34.0)
MCHC: 34.4 g/dL (ref 30.0–36.0)
MCV: 96.1 fL (ref 80.0–100.0)
Monocytes Absolute: 0.6 K/uL (ref 0.1–1.0)
Monocytes Relative: 7 %
Neutro Abs: 5.9 K/uL (ref 1.7–7.7)
Neutrophils Relative %: 75 %
Platelets: 309 K/uL (ref 150–400)
RBC: 2.54 MIL/uL — ABNORMAL LOW (ref 4.22–5.81)
RDW: 14.9 % (ref 11.5–15.5)
WBC: 7.8 K/uL (ref 4.0–10.5)
nRBC: 0 % (ref 0.0–0.2)

## 2023-11-20 LAB — GLUCOSE, CAPILLARY
Glucose-Capillary: 107 mg/dL — ABNORMAL HIGH (ref 70–99)
Glucose-Capillary: 147 mg/dL — ABNORMAL HIGH (ref 70–99)
Glucose-Capillary: 178 mg/dL — ABNORMAL HIGH (ref 70–99)
Glucose-Capillary: 144 mg/dL — ABNORMAL HIGH (ref 70–99)

## 2023-11-20 NOTE — Progress Notes (Signed)
 Inpatient Rehabilitation Discharge Medication Review by a Pharmacist  A complete drug regimen review was completed for this patient to identify any potential clinically significant medication issues.  High Risk Drug Classes Is patient taking? Indication by Medication  Antipsychotic No   Anticoagulant Yes Apixaban  - Afib  Antibiotic Yes Bactrim - wound infection  Opioid Yes Norco - pain  Antiplatelet No   Hypoglycemics/insulin Yes Metformin  - DM  Vasoactive Medication Yes Amlodipine  - HTN  Chemotherapy No   Other Yes Vitamin C, Zinc , Vitamin D3 - supplements Collagenase - wound care Voltaren, acetaminophen , methocarbamol - pain Melatonin - sleep Pantoprazole - GERD PEG, senokot-s- constipation Atorvastatin , ezetimibe  - HLD Combigan  - Glaucoma     Type of Medication Issue Identified Description of Issue Recommendation(s)  Drug Interaction(s) (clinically significant)     Duplicate Therapy     Allergy     No Medication Administration End Date     Incorrect Dose     Additional Drug Therapy Needed     Significant med changes from prior encounter (inform family/care partners about these prior to discharge). Stop: Lisinopril , hydrochlorothiazide , Tramadol  Start: Bactrim through 11/12, pantoprazole, voltaren, collagenase  Change: tramadol -> norco Communicate relevant medication changes to patient/family members at discharge from CIR.   Restart or discontinue PTA meds not resumed in CIR at discharge if clinically indicated.   Other       Clinically significant medication issues were identified that warrant physician communication and completion of prescribed/recommended actions by midnight of the next day:  No  Name of provider notified for urgent issues identified:   Provider Method of Notification:     Pharmacist comments:   Time spent performing this drug regimen review (minutes):  20   Larraine Brazier, PharmD Clinical Pharmacist 11/20/2023  11:40  AM **Pharmacist phone directory can now be found on [email or web address] ([email or web address]).  Listed under [facility].

## 2023-11-20 NOTE — Progress Notes (Signed)
 PROGRESS NOTE   Subjective/Complaints:  Pt doing well, excited about d/c tomorrow. Slept well, pain doing fine, LBM about an hour ago, urinating well per pt. No other complaints or concerns.   ROS: as per HPI. Denies CP, SOB, abd pain, N/V/D/C, or any other complaints at this time.   Stiffness in right hip/knee  Objective:   DG FEMUR, MIN 2 VIEWS RIGHT Result Date: 11/18/2023 EXAM: 2 VIEW(S) XRAY OF THE RIGHT FEMUR 11/18/2023 11:15:00 AM COMPARISON: None available. CLINICAL HISTORY: Hip pain FINDINGS: BONES AND JOINTS: Right total hip arthroplasty noted. Moderate enthesopathic changes of the inferior patella. SOFT TISSUES: Atherosclerotic vascular calcifications. IMPRESSION: 1. Right total hip arthroplasty. 2. Moderate enthesopathic changes of the inferior patella. Electronically signed by: Norleen Boxer MD 11/18/2023 12:24 PM EST RP Workstation: [id number]    Recent Labs    11/18/23 0502 11/20/23 0530  WBC 8.1 7.8  HGB 8.9* 8.4*  HCT 25.9* 24.4*  PLT 352 309    Recent Labs    11/18/23 0502 11/20/23 0530  NA 136 135  K 3.8 4.0  CL 106 105  CO2 22 20*  GLUCOSE 114* 107*  BUN 28* 27*  CREATININE 1.17 1.21  CALCIUM  9.1 9.2     Intake/Output Summary (Last 24 hours) at 11/20/2023 1055 Last data filed at 11/20/2023 9270 Gross per 24 hour  Intake 290 ml  Output 300 ml  Net -10 ml     Wound 11/09/23 1600 Pressure Injury Coccyx Mid Unstageable - Full thickness tissue loss in which the base of the injury is covered by slough (yellow, tan, gray, green or brown) and/or eschar (tan, brown or black) in the wound bed. (Active)     Wound 11/09/23 1600 Pressure Injury Heel Left Unstageable - Full thickness tissue loss in which the base of the injury is covered by slough (yellow, tan, gray, green or brown) and/or eschar (tan, brown or black) in the wound bed. (Active)     Wound 11/09/23 1600 Pressure Injury Toe (Comment  which  one) Anterior;Right Stage 2 -  Partial thickness loss of dermis presenting as a shallow open injury with a red, pink wound bed without slough. (Active)    Physical Exam: Vital Signs Blood pressure (!) 132/57, pulse 87, temperature 98 F (36.7 C), temperature source Oral, resp. rate 16, height 5' 11 (1.803 m), weight 68 kg, SpO2 99%.  Constitutional: No apparent distress. Appropriate appearance for age.  Sitting up in w/c in room HENT: No JVD. Neck Supple. Trachea midline. Atraumatic, normocephalic. Eyes: PERRLA. Cardiovascular: RRR, no murmurs/rub/gallops.  Trace bilateral peripheral edema. Respiratory: CTAB. No rales, rhonchi, or wheezing. On RA.  Abdomen: + bowel sounds, normoactive. No distention or tenderness. Soft Psych: pleasant and cooperative  PRIOR EXAMS: Skin: C/D/I.  + R hip Surgical site with minimal drainage--stable + R elbow skin tear - c/d/I.  + blancheable erythema bilateral 1st toes and heels--covered in mepilex; toe and heel now slightly open. + Sacral wound-with some green drainage in wound bed today, slough adhered to bed        MSK:      No apparent deformity. + Tightness in right hamstrings, improved from last exam.  Mild  pain behind the knee, extending up into the right hip with range of motion.  Neurologic exam:  Cognition: AAO to person, place, time and event.   Insight: Good  insight into current condition.  Mood: Pleasant affect, appropriate mood.   Strength: UEs 5-/5 throughout LLE 5-/5 throughout RLE-4/5 in HF, 5-/5KE, KF, 5/5 distally   Assessment/Plan: 1. Functional deficits which require 3+ hours per day of interdisciplinary therapy in a comprehensive inpatient rehab setting. Physiatrist is providing close team supervision and 24 hour management of active medical problems listed below. Physiatrist and rehab team continue to assess barriers to discharge/monitor patient progress toward functional and medical goals  Care Tool:  Bathing     Body parts bathed by patient: Right arm, Left arm, Chest, Abdomen, Front perineal area, Buttocks, Right upper leg, Left upper leg, Face, Right lower leg, Left lower leg   Body parts bathed by helper: Right lower leg, Left lower leg     Bathing assist Assist Level: Independent with assistive device     Upper Body Dressing/Undressing Upper body dressing   What is the patient wearing?: Pull over shirt    Upper body assist Assist Level: Independent    Lower Body Dressing/Undressing Lower body dressing      What is the patient wearing?: Underwear/pull up, Pants     Lower body assist Assist for lower body dressing: Independent with assitive device Assistive Device Comment: Dressing stick and reacher.   Toileting Toileting    Toileting assist Assist for toileting: Independent with assistive device     Transfers Chair/bed transfer  Transfers assist     Chair/bed transfer assist level: Independent with assistive device Chair/bed transfer assistive device: Geologist, Engineering   Ambulation assist      Assist level: Independent with assistive device Assistive device: Rollator Max distance: 300'   Walk 10 feet activity   Assist     Assist level: Independent with assistive device Assistive device: Rollator   Walk 50 feet activity   Assist    Assist level: Independent with assistive device Assistive device: Rollator    Walk 150 feet activity   Assist Walk 150 feet activity did not occur: Safety/medical concerns (nausea with 100' walk)  Assist level: Independent with assistive device Assistive device: Rollator    Walk 10 feet on uneven surface  activity   Assist Walk 10 feet on uneven surfaces activity did not occur: Safety/medical concerns   Assist level: Contact Guard/Touching assist Assistive device: Rollator   Wheelchair     Assist Is the patient using a wheelchair?: No (ambulates on unit) Type of Wheelchair: Manual     Wheelchair assist level: Dependent - Patient 0%      Wheelchair 50 feet with 2 turns activity    Assist        Assist Level: Dependent - Patient 0%   Wheelchair 150 feet activity     Assist      Assist Level: Dependent - Patient 0%   Blood pressure (!) 132/57, pulse 87, temperature 98 F (36.7 C), temperature source Oral, resp. rate 16, height 5' 11 (1.803 m), weight 68 kg, SpO2 99%.  Medical Problem List and Plan: 1. 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  Weightbearing as tolerated             -patient may  shower-cover surgical dressing and skin tears             -  ELOS/Goals: 12-14 days- supervision - 11/9 DC             - Stable to continue CIR   - 11/4: Lives alone, needs to be IND for home because PRN support. SPV PT up to 250 ft with walker, Setup UP, Min A LB due to hip stiffness when getting pants on. Needs occassional cueing for recall. Will need BSC.   -11/20/23 d/c tomorrow  2.  Antithrombotics: -DVT/anticoagulation:  Pharmaceutical: Eliquis              -antiplatelet therapy: N/A 3. Pain Management: Robaxin 500 mg 3 times daily, hydrocodone 5-325 mg 1 tablet every 6 hours as needed  - Pain well-controlled on current regimen 4. Mood/Behavior/Sleep: Melatonin 3 mg nightly             -antipsychotic agents: N/A 5. Neuropsych/cognition: This patient is capable of making decisions on his own behalf. 6. Skin/Wound Care: Routine skin checks   - 10/29: L heel and b/l 1st toe DTIs; add prevalon boots when in bed  - 10/31: WOC consult for sacral unstageable, left heel/toe, and right arm eschar.  Cleanse coccyx/buttocks wound with Vashe, do not rinse and allow to air dry. Apply 1/4 thick layer of Santyl to wound bed, top with saline moist gauze, dry gauze and silicone foam.  May lift foam daily to replace Santyl, change foam q3 days and prn soiling.  Cleanse L heel  and L great toe with soap and water, dry and apply Xeroform gauze Soila [phone]) every other day to purple discoloration.  Place L foot in Prevalon boot to offload pressure.  Cleanse R arm wound with Vashe, apply Xeroform gauze to wound bed daily and secure with silicone foam or Kerlix roll gauze whichever is preferred.  Soak dressing with NS if adhered to wound bed for atraumatic removal.  11-3: Has been 2 weeks postop, will message Dr. Ernie regarding removal of dressing--cleared by Rosina Calin, PA to remove dressing 1 11-7: WOC with wound follow-up, sacrum cleanse with Vashe and apply Santyl to wound bed with foam dressing over top; Xeroform to left heel; Xeroform to left first toe.  Due to concerns for superficial wound bed infection of the sacrum, will start Bactrim 800 mg twice daily for 5 days.  7. Fluids/Electrolytes/Nutrition: Routine in and outs with follow-up chemistries  - Admission labs stable  11-7: BMP, CBC in a.m. to monitor for recurrent AKI with resumption of metformin   -11/20/23 BMP stable without recurrent AKI, CBC as below.   8.  Acute blood loss anemia.  Transfused 1 unit packed red blood cells follow-up CBC  - Hemoglobin improved to 11 on admission labs; monitor  -11-3: Hemoglobin down to 9.0.  Elevated BUN indicating possible GI etiology.  Get FOBT, repeat H&H to confirm.  Adding Protonix 40 mg twice daily.--Repeat stable, 9.8.  - 11-6: FOBT negative. -11/20/23 Hgb 8.4, down from 8.9, but relatively stable the last couple checks. Doubt need recheck in the AM, can f/up outpatient with PCP, will recommend MVI  9.  Hypotension.  Norvasc  5 mg daily.  HCTZ 12.5 mg daily and lisinopril  20 mg daily currently on hold and resume as needed.  Monitor with increased mobility  -11/1-2 BP soft but stable, continue to monitor  - 11-4: Some peripheral edema, BP soft.  DC Norvasc .--Monitor, asymptomatic -11/20/23 BPs stable, monitor Vitals:   11/16/23 1311 11/16/23 2027 11/17/23 0600  11/17/23 1437  BP: (!) 111/54 (!) 110/56 117/64 (!) 105/52   11/17/23 1942  11/18/23 0545 11/18/23 1715 11/18/23 1931  BP: (!) 116/55 (!) 125/59 126/63 (!) 108/57   11/19/23 0405 11/19/23 1517 11/19/23 1935 11/20/23 0603  BP: 115/61 (!) 119/57 (!) 104/56 (!) 132/57      10.  Hyperlipidemia.  Lipitor/Zetia  11.  Constipation.  MiraLAX daily prn per pt request, Senokot-S 1 tablet twice daily  - Monitor today, and may need sorbitol  - 10-30: Sincrease sennakot to 2 tabs BID  - 10-31: No BMs recorded; will resume daily MiraLAX  -11/20/23 LBM this morning, cont regimen    12.  Diabetes mellitus.  SSI.  Hemoglobin A1c 6.2  - Blood sugars well-controlled, monitor  - 11/1 CBGs controlled, continue to monitor - 11/2 Fair control, continue to monitor  - 11-3: Consistently high blood sugars at nighttime.  However, cautious to add nightly insulin given a.m. lows.  Hemoglobin A1c is at goal for age, resume home metformin  500 mg twice daily. 11-4: Creatinine tolerating metformin , blood sugars improved.  Monitor on current regimen.  -11/20/23 CBGs better in last 24hrs, cont regimen as is, will need PCP f/up Recent Labs    11/19/23 1701 11/19/23 2128 11/20/23 0610  GLUCAP 201* 98 107*    13.  CAD/permanent pacemaker/ventricular tachycardia- Afib s/p conversion 6 weeks ago .  Continue Eliquis .  Follow-up cardiology services Dr.Crotoru  - 11-3: Creatinine now in range to resume Eliquis  at 5 mg twice daily dosing--> resumed 11-6 due to stable hemoglobin  -11-7: Labs in a.m. to monitor hemoglobin with full dose Eliquis  resumption   -11/20/23 Hgb 8.4 from 8.9 but would consider overall stability, doubt need for repeat, f/up outpatient as mentioned above.   14.  AKI on CKD3A.  Cr got to  2.5- down to 1.3 Received gentle IV fluids.  Follow-up chemistries  - Admission labs with creatinine 1.3; trend  -BMP tomorrow--creatinine improved, BUN uptrending as above.    - 11-4: Stable  11-6:  Improving  -11/20/23 BMP stable  15. Glaucoma- con't drops  16.  Thrombocytosis.  Likely reactive, trend.  - Resolved  17.  Right hamstring tightnes/stiffness  - 11-6 femur x-ray shows hardware intact, mild patellar arthritis  - Add heat pack as needed the posterior leg  - Voltaren gel 4 times daily to the knee and hamstrings  11-7: Symptoms improved  LOS: 11 days A FACE TO FACE EVALUATION WAS PERFORMED  [address] 11/20/2023, 10:55 AM

## 2023-11-20 NOTE — Progress Notes (Signed)
 Occupational Therapy Session Note  Patient Details  Name: Travis [name] MRN: [id number] Date of Birth: 10-16-1940  Today's Date: 11/20/2023 OT Individual Time: 1255-1415 OT Individual Time Calculation (min): 80 min    Short Term Goals: Week 1:  OT Short Term Goal 1 (Week 1): STGs=LTGs due to patient's estimated length of stay. OT Short Term Goal 1 - Progress (Week 1): Progressing toward goal Week 2:  OT Short Term Goal 1 (Week 2): STGs=LTGs due to patient's estimated length of stay.   Skilled Therapeutic Interventions/Progress Updates:    Pt up in wheechair at time of session, taking time to finish eating lunch that arrived late. Politely declined showering this date but aware of going home tomorrow. States he was nauseous this AM, unsure the cause. Pt performing ambulatory transfer with rollator Mod I, toilet transfer and all toileting tasks MOD I. Daughter present part of the way through session and present to observe pt progress and skill performance. Ambulated room > ADL apartment > gym > back to room all MOD I with rollator. Demonstrated good recall for brakes throughout and safety sitting on seat for recovery breaks. In ADL aparmtent performing shower transfer with front entry method and simulated placement of grab bar, as well as simulated IADL for gathering food prep items with rollator in prep for home. Discussed techniques for work simplification for meal prep as well. In gym, performed obstacle course weaving through 6/6 cones and step over block with Supervision, standing at rebounder for 2x15 throws as well. Back in room adjusted new rollator for home to highest height available. Left up in chair alarm on call bell in reach.   Therapy Documentation Precautions:  Precautions Precautions: Fall Recall of Precautions/Restrictions: Intact Precaution/Restrictions Comments: Anterior approach THA Restrictions Weight Bearing Restrictions Per Provider Order: Yes RLE Weight Bearing Per  Provider Order: Weight bearing as tolerated    Therapy/Group: Individual Therapy  Chiquita JAYSON Hopping 11/20/2023, 7:28 AM

## 2023-11-21 ENCOUNTER — Other Ambulatory Visit (HOSPITAL_COMMUNITY): Payer: Self-pay

## 2023-11-21 DIAGNOSIS — R739 Hyperglycemia, unspecified: Secondary | ICD-10-CM | POA: Diagnosis not present

## 2023-11-21 DIAGNOSIS — D62 Acute posthemorrhagic anemia: Secondary | ICD-10-CM | POA: Diagnosis not present

## 2023-11-21 DIAGNOSIS — S72001D Fracture of unspecified part of neck of right femur, subsequent encounter for closed fracture with routine healing: Secondary | ICD-10-CM | POA: Diagnosis not present

## 2023-11-21 DIAGNOSIS — I10 Essential (primary) hypertension: Secondary | ICD-10-CM | POA: Diagnosis not present

## 2023-11-21 LAB — GLUCOSE, CAPILLARY: Glucose-Capillary: 110 mg/dL — ABNORMAL HIGH (ref 70–99)

## 2023-11-21 NOTE — Progress Notes (Signed)
 PROGRESS NOTE   Subjective/Complaints:  Pt doing well, excited about d/c. Slept well, pain well managed, LBM this morning, urinating well per pt. No other complaints or concerns. Discussed discharge instructions/medications.   ROS: as per HPI. Denies CP, SOB, abd pain, N/V/D/C, or any other complaints at this time.   Stiffness in right hip/knee  Objective:   No results found.   Recent Labs    11/20/23 0530  WBC 7.8  HGB 8.4*  HCT 24.4*  PLT 309    Recent Labs    11/20/23 0530  NA 135  K 4.0  CL 105  CO2 20*  GLUCOSE 107*  BUN 27*  CREATININE 1.21  CALCIUM  9.2     Intake/Output Summary (Last 24 hours) at 11/21/2023 0919 Last data filed at 11/21/2023 9170 Gross per 24 hour  Intake 468 ml  Output 300 ml  Net 168 ml     Wound 11/09/23 1600 Pressure Injury Coccyx Mid Unstageable - Full thickness tissue loss in which the base of the injury is covered by slough (yellow, tan, gray, green or brown) and/or eschar (tan, brown or black) in the wound bed. (Active)     Wound 11/09/23 1600 Pressure Injury Heel Left Unstageable - Full thickness tissue loss in which the base of the injury is covered by slough (yellow, tan, gray, green or brown) and/or eschar (tan, brown or black) in the wound bed. (Active)     Wound 11/09/23 1600 Pressure Injury Toe (Comment  which one) Anterior;Right Stage 2 -  Partial thickness loss of dermis presenting as a shallow open injury with a red, pink wound bed without slough. (Active)    Physical Exam: Vital Signs Blood pressure (!) 128/58, pulse 80, temperature 98.5 F (36.9 C), temperature source Oral, resp. rate 18, height 5' 11 (1.803 m), weight 68 kg, SpO2 95%.  Constitutional: No apparent distress. Appropriate appearance for age.  Sitting up in w/c in room finishing breakfast HENT: No JVD. Neck Supple. Trachea midline. Atraumatic, normocephalic. Eyes: PERRLA. Cardiovascular: RRR, no  murmurs/rub/gallops.  Trace bilateral peripheral edema. Respiratory: CTAB. No rales, rhonchi, or wheezing. On RA.  Abdomen: + bowel sounds, normoactive. No distention or tenderness. Soft Psych: pleasant and cooperative  PRIOR EXAMS: Skin: C/D/I.  + R hip Surgical site with minimal drainage--stable + R elbow skin tear - c/d/I.  + blancheable erythema bilateral 1st toes and heels--covered in mepilex; toe and heel now slightly open. + Sacral wound-with some green drainage in wound bed today, slough adhered to bed        MSK:      No apparent deformity. + Tightness in right hamstrings, improved from last exam.  Mild pain behind the knee, extending up into the right hip with range of motion.  Neurologic exam:  Cognition: AAO to person, place, time and event.   Insight: Good  insight into current condition.  Mood: Pleasant affect, appropriate mood.   Strength: UEs 5-/5 throughout LLE 5-/5 throughout RLE-4/5 in HF, 5-/5KE, KF, 5/5 distally   Assessment/Plan: 1. Functional deficits which require 3+ hours per day of interdisciplinary therapy in a comprehensive inpatient rehab setting. Physiatrist is providing close team supervision and 24 hour  management of active medical problems listed below. Physiatrist and rehab team continue to assess barriers to discharge/monitor patient progress toward functional and medical goals  Care Tool:  Bathing    Body parts bathed by patient: Right arm, Left arm, Chest, Abdomen, Front perineal area, Buttocks, Right upper leg, Left upper leg, Face, Right lower leg, Left lower leg   Body parts bathed by helper: Right lower leg, Left lower leg     Bathing assist Assist Level: Independent with assistive device     Upper Body Dressing/Undressing Upper body dressing   What is the patient wearing?: Pull over shirt    Upper body assist Assist Level: Independent    Lower Body Dressing/Undressing Lower body dressing      What is the patient  wearing?: Underwear/pull up, Pants     Lower body assist Assist for lower body dressing: Independent with assitive device Assistive Device Comment: Dressing stick and reacher.   Toileting Toileting    Toileting assist Assist for toileting: Independent with assistive device     Transfers Chair/bed transfer  Transfers assist     Chair/bed transfer assist level: Independent with assistive device Chair/bed transfer assistive device: Geologist, Engineering   Ambulation assist      Assist level: Independent with assistive device Assistive device: Rollator Max distance: 300'   Walk 10 feet activity   Assist     Assist level: Independent with assistive device Assistive device: Rollator   Walk 50 feet activity   Assist    Assist level: Independent with assistive device Assistive device: Rollator    Walk 150 feet activity   Assist Walk 150 feet activity did not occur: Safety/medical concerns (nausea with 100' walk)  Assist level: Independent with assistive device Assistive device: Rollator    Walk 10 feet on uneven surface  activity   Assist Walk 10 feet on uneven surfaces activity did not occur: Safety/medical concerns   Assist level: Contact Guard/Touching assist Assistive device: Rollator   Wheelchair     Assist Is the patient using a wheelchair?: No (ambulates on unit) Type of Wheelchair: Manual    Wheelchair assist level: Dependent - Patient 0%      Wheelchair 50 feet with 2 turns activity    Assist        Assist Level: Dependent - Patient 0%   Wheelchair 150 feet activity     Assist      Assist Level: Dependent - Patient 0%   Blood pressure (!) 128/58, pulse 80, temperature 98.5 F (36.9 C), temperature source Oral, resp. rate 18, height 5' 11 (1.803 m), weight 68 kg, SpO2 95%.  Medical Problem List and Plan: 1. Functional deficits secondary to R femoral hip fracture with post op debility/orthostasis  after right total hip arthroplasty 10/31/2023.  Weightbearing as tolerated             -patient may  shower-cover surgical dressing and skin tears             -ELOS/Goals: 12-14 days- supervision - 11/9 DC             - Stable to continue CIR   - 11/4: Lives alone, needs to be IND for home because PRN support. SPV PT up to 250 ft with walker, Setup UP, Min A LB due to hip stiffness when getting pants on. Needs occassional cueing for recall. Will need BSC.   -11/21/23 d/c today, paperwork/meds reviewed  2.  Antithrombotics: -DVT/anticoagulation:  Pharmaceutical: Eliquis              -  antiplatelet therapy: N/A 3. Pain Management: Robaxin 500 mg 3 times daily, hydrocodone 5-325 mg 1 tablet every 6 hours as needed  - Pain well-controlled on current regimen 4. Mood/Behavior/Sleep: Melatonin 3 mg nightly             -antipsychotic agents: N/A 5. Neuropsych/cognition: This patient is capable of making decisions on his own behalf. 6. Skin/Wound Care: Routine skin checks   - 10/29: L heel and b/l 1st toe DTIs; add prevalon boots when in bed  - 10/31: WOC consult for sacral unstageable, left heel/toe, and right arm eschar.  Cleanse coccyx/buttocks wound with Vashe, do not rinse and allow to air dry. Apply 1/4 thick layer of Santyl to wound bed, top with saline moist gauze, dry gauze and silicone foam.  May lift foam daily to replace Santyl, change foam q3 days and prn soiling.  Cleanse L heel and L great toe with soap and water, dry and apply Xeroform gauze Soila [phone]) every other day to purple discoloration.  Place L foot in Prevalon boot to offload pressure.  Cleanse R arm wound with Vashe, apply Xeroform gauze to wound bed daily and secure with silicone foam or Kerlix roll gauze whichever is preferred.  Soak dressing with NS if adhered to wound bed for atraumatic removal.  11-3: Has been 2 weeks postop, will message Dr. Ernie regarding removal of dressing--cleared by Rosina Calin, PA to remove  dressing 1 11-7: WOC with wound follow-up, sacrum cleanse with Vashe and apply Santyl to wound bed with foam dressing over top; Xeroform to left heel; Xeroform to left first toe.  Due to concerns for superficial wound bed infection of the sacrum, will start Bactrim 800 mg twice daily for 5 days.  7. Fluids/Electrolytes/Nutrition: Routine in and outs with follow-up chemistries  - Admission labs stable  11-7: BMP, CBC in a.m. to monitor for recurrent AKI with resumption of metformin   -11/20/23 BMP stable without recurrent AKI, CBC as below.   8.  Acute blood loss anemia.  Transfused 1 unit packed red blood cells follow-up CBC  - Hemoglobin improved to 11 on admission labs; monitor  -11-3: Hemoglobin down to 9.0.  Elevated BUN indicating possible GI etiology.  Get FOBT, repeat H&H to confirm.  Adding Protonix 40 mg twice daily.--Repeat stable, 9.8.  - 11-6: FOBT negative. -11/20/23 Hgb 8.4, down from 8.9, but relatively stable the last couple checks. Doubt need recheck in the AM, can f/up outpatient with PCP, will recommend MVI  9.  Hypotension.  Norvasc  5 mg daily.  HCTZ 12.5 mg daily and lisinopril  20 mg daily currently on hold and resume as needed.  Monitor with increased mobility  -11/1-2 BP soft but stable, continue to monitor  - 11-4: Some peripheral edema, BP soft.  DC Norvasc .--Monitor, asymptomatic -11/8-9/25 BPs stable, monitor Vitals:   11/17/23 1437 11/17/23 1942 11/18/23 0545 11/18/23 1715  BP: (!) 105/52 (!) 116/55 (!) 125/59 126/63   11/18/23 1931 11/19/23 0405 11/19/23 1517 11/19/23 1935  BP: (!) 108/57 115/61 (!) 119/57 (!) 104/56   11/20/23 0603 11/20/23 1435 11/20/23 2031 11/21/23 0504  BP: (!) 132/57 (!) 110/56 139/68 (!) 128/58      10.  Hyperlipidemia.  Lipitor/Zetia  11.  Constipation.  MiraLAX daily prn per pt request, Senokot-S 1 tablet twice daily  - Monitor today, and may need sorbitol  - 10-30: Sincrease sennakot to 2 tabs BID  - 10-31: No BMs recorded; will  resume daily MiraLAX  -11/21/23 LBM this morning, cont regimen  12.  Diabetes mellitus.  SSI.  Hemoglobin A1c 6.2  - Blood sugars well-controlled, monitor  - 11/1 CBGs controlled, continue to monitor - 11/2 Fair control, continue to monitor  - 11-3: Consistently high blood sugars at nighttime.  However, cautious to add nightly insulin given a.m. lows.  Hemoglobin A1c is at goal for age, resume home metformin  500 mg twice daily. 11-4: Creatinine tolerating metformin , blood sugars improved.  Monitor on current regimen.  -11/8-9/25 CBGs better in last 24hrs, cont regimen as is, will need PCP f/up Recent Labs    11/20/23 1610 11/20/23 2113 11/21/23 0626  GLUCAP 178* 144* 110*    13.  CAD/permanent pacemaker/ventricular tachycardia- Afib s/p conversion 6 weeks ago .  Continue Eliquis .  Follow-up cardiology services Dr.Crotoru  - 11-3: Creatinine now in range to resume Eliquis  at 5 mg twice daily dosing--> resumed 11-6 due to stable hemoglobin  -11-7: Labs in a.m. to monitor hemoglobin with full dose Eliquis  resumption   -11/20/23 Hgb 8.4 from 8.9 but would consider overall stability, doubt need for repeat, f/up outpatient as mentioned above.   14.  AKI on CKD3A.  Cr got to  2.5- down to 1.3 Received gentle IV fluids.  Follow-up chemistries  - Admission labs with creatinine 1.3; trend  -BMP tomorrow--creatinine improved, BUN uptrending as above.    - 11-4: Stable  11-6: Improving  -11/20/23 BMP stable  15. Glaucoma- con't drops  16.  Thrombocytosis.  Likely reactive, trend.  - Resolved  17.  Right hamstring tightnes/stiffness  - 11-6 femur x-ray shows hardware intact, mild patellar arthritis  - Add heat pack as needed the posterior leg  - Voltaren gel 4 times daily to the knee and hamstrings  11-7: Symptoms improved  LOS: 12 days A FACE TO FACE EVALUATION WAS PERFORMED  [address] 11/21/2023, 9:19 AM

## 2023-11-21 NOTE — Progress Notes (Signed)
 Provided education to patient's daughter on how to change dressings to sacrum, L great toe, and L heel. Daughter verbalized understanding. Written instructions and necessary supplies provided for home care. Patient and daughter encouraged to contact provider with any questions or concerns.

## 2023-11-22 NOTE — Progress Notes (Signed)
 WOC consulted for re-assessment and update on dressing orders  Patient had 1.Unstageable PI on coccyx           2. L heel with open area - partial thickness. Previous classified as DTPI           3. Top of great toe - partial thickness previously classified as DTPI

## 2023-11-24 ENCOUNTER — Other Ambulatory Visit (HOSPITAL_BASED_OUTPATIENT_CLINIC_OR_DEPARTMENT_OTHER): Payer: Self-pay

## 2023-11-24 ENCOUNTER — Other Ambulatory Visit: Payer: Self-pay

## 2023-11-25 ENCOUNTER — Other Ambulatory Visit (HOSPITAL_BASED_OUTPATIENT_CLINIC_OR_DEPARTMENT_OTHER): Payer: Self-pay

## 2023-11-29 ENCOUNTER — Other Ambulatory Visit (HOSPITAL_COMMUNITY): Payer: Self-pay

## 2023-12-18 DIAGNOSIS — E119 Type 2 diabetes mellitus without complications: Secondary | ICD-10-CM

## 2023-12-19 NOTE — Progress Notes (Signed)
 [facility] Case Management Initial Assessment    Patient:                   Travis [name] MRN:                          [id number] Patient Date of Birth:       Jan 03, 1941 Age/Sex:                   83 y.o./male  Assessment   Discussed role of CM and completed assessment with: Patient, Caregiver Patient: At bedside Caregiver: At bedside Caregiver name: daughter [name] Are you a veteran?: Yes Are you seeing a PCP with the [facility]?: Yes Do you have a PCP and CM verified PCP?: Yes  High risk category: Advanced Age/Over 75       Patient admitted from: Support Person's Home Care Facility Name: lives with daughter [name]    Functional status prior to admission: Independent - not needing assistance                          Home Living  Type of Home: House Home Layout: Multi-level Bathroom Shower/Tub: Tub/shower unit Bathroom Toilet: Raised Home Equipment: Rolling walker, Bedside commode, 4 wheeled walker (Pt ambulates with 4WW) Mode of Transportation: Support person Comments: pt daughters take him to medical appointments.  Respiratory Equipment used at Home  Respiratory equipment used at home: None  Social Drivers of Health  In the past 12 months, has lack of transportation kept you from medical appointments or from getting medications?: No In the past 12 months, has lack of transportation kept you from meetings, work, or from getting things needed for daily living?: No Income source: SSI/Pension/Retirement Any concerns managing your health?: No concerns     Assessment and Plan  Disposition: Skilled nursing facility Current Hospital Functional Status: Requires occassional supervision and assistance Anticipated Disposition: SNF/Skilled Nursing Facility-Short Term Rehab Anticipated needs: Transportation    Patient was A&Ox3 during assessment, he provided identifiers. Pt lives with daughter [name] and reports being  independent since DC from [facility], using [phone] to ambulate.  Pt admitted to [facility] on 11/09/2023.  Pt daughter [name] was present during assessment and report he was doing well up until a week ago when he had difficulties getting up.  She denied any difficulties with transportation,medications and/or having his basic needs met.  CM discussed PT recommendation for STR, patient is agreeable. CM will need to work with his daughter [name] on options for rehab.  CM explained [facility] protocol for SNF and that should patient mobility improve, the alternate DC plan may be HH.  CM sent referral to [facility] requesting bed offers.  Electronically signed: Darice Goldmann, MSW 12/19/2023 3:45 PM

## 2023-12-21 NOTE — Progress Notes (Signed)
 [facility] Case Management     Patient:   Elven Laboy MR Number:  [id number] Patient Date of Birth: 01/29/1940 Age/Sex:  83 y.o./male     Spoke with pt's daughter, [name] [phone]; pt identifiers verified.  Discussed dc plans and therapy evals.  She is in agreement pt needs rehab prior to dc home.  We discussed SNF bed offers and preferences.  She stated she would really like to see if [facility] would be able to accept him again.  Pt was running a marathon not that long ago.  She feels he needs more intensive rehab.  I did note we would need a backup option if [facility] cannot accept.  She would also like me to check with [facility] if [facility] is unable to accept.  She would consider [facility], and [facility] as backups to [facility], which have all made bed offers.  I educated her on [facility] payment with SNF.  She verbalized understanding of all.   Referral faxed to [facility] [phone] via [employer] communication.  Message left on [place] [phone], [facility]'s VM informing her referral was faxed and to call me back to let me know if their rehab program is an option for this patient.  CM will continue to follow to assist as appropriate.    Electronically signed: Burnard LITTIE Holland, RN, BSN 12/21/2023 / 4:57 PM

## 2023-12-22 NOTE — Care Plan (Signed)
°  Problem: Discharge Planning Goal: Knowledge of medical problems (What is my main problem?) Outcome: Progressing Goal: Knowledge of self care (What do I need to do when I go home?) Outcome: Progressing Goal: Knowledge of treatment plan (Why is it important for me to do this?) Outcome: Progressing Goal: Knowledge of medication management Outcome: Progressing   Problem: Injury Risk, Abnormal Glucose Level Goal: Glucose level within specified parameters Outcome: Progressing   Problem: Sensory Perception - Impaired Goal: Absence of physical injury Outcome: Progressing   Problem: Deep Venous Thrombosis, Risk of Goal: Absence of deep venous thrombosis Outcome: Progressing   Problem: Bleeding, Risk of Goal: Absence of active bleeding Outcome: Progressing Goal: Absence of impaired coagulation signs and symptoms Outcome: Progressing   Problem: Pressure Injury Goal: Pressure injury healing Outcome: Progressing Goal: Absence of new pressure injury Outcome: Progressing   Problem: Fall Prevention Goal: Absence of falls Outcome: Progressing

## 2023-12-22 NOTE — Progress Notes (Signed)
 [facility]  PROGRESS NOTE  Travis [name] FMW:[id number] DOB: 04-Oct-1940 DOA: 12/17/2023 PCP: Charlie LELON Reas, MD  HPI/Recap of past 24 hours: Patient is an 83 year old male with past medical history of diabetes mellitus type 2 not on insulin , essential hypertension, paroxysmal atrial fibrillation and sick sinus syndrome status post pacemaker who despite his age he is quite active, participating in marathons earlier this year until he fell and had a hip fracture in October.  Patient is fully functional without mental confusion although since his recovery from his hip fracture, has been with decreased ambulation using a walker.  Patient's daughter noted that he was in his usual state of health on 12/4, even having lunch at a restaurant with a friend, but then on the morning of 12/5 look to be confused with weakness to the point where he was having difficulty walking or lifting up his arms and legs and was brought into the emergency room.  Initial CT scan of head was unremarkable.  Assessment/Plan: Principal Problem: Acute metabolic encephalopathy: No evidence of stroke and some mild foraminal stenosis, but nothing significant which would be causing bilateral arm and leg weakness as well as lead to mental confusion.  No lab abnormalities including normal CK, TSH, ammonia, procalcitonin.  He had been on opiates at home for pain as needed and getting Ultram  here and when these medications were stopped, he has noted significant improvement.  He is still somewhat weak and I suspect that he has been deconditioned at home  (as evidenced by his skin breakdown on his backside and heels) exacerbated by several days acutely confused in the hospital and not getting out of bed.  Seen by PT and OT and recommendation for inpatient rehab which patient will strongly benefit from.   Active Problems:   PAF (paroxysmal atrial fibrillation) (*): Staying in normal sinus rhythm   Type 2 diabetes mellitus without  complication, without long-term current use of insulin  (*): Following blood sugars   Essential hypertension: BP trending upwards and have added hydralazine    Dyslipidemia   Macrocytic anemia   History of glaucoma: Stable continue drops  Sundowning: Although patient is now fully alert during the day, he still gets confused at night and in the early morning.  Patient daughter said that this also happened at his previous hospitalization.  I suspect that he may have some mild dementia made worse at night, but otherwise does well.   Code Status: Full code  Family Communication: Updated daughter in person  Disposition Plan: Have reached out to inpatient rehab, awaiting evaluation   Consultants: Neurology  Procedures: None  Antimicrobials: None  DVT prophylaxis: SCDs  Subjective Patient more awake and interactive, fatigued  Objective: Vitals:   12/22/23 0825  BP: (!) 168/81  Pulse: 90  Resp: 18  Temp: 98.3 F (36.8 C)  SpO2: 99%    Intake/Output Summary (Last 24 hours) at 12/22/2023 1512 Last data filed at 12/22/2023 1048 Gross per 24 hour  Intake 360 ml  Output 475 ml  Net -115 ml   Body mass index is 21.09 kg/m.  Exam:  General: Alert and oriented x 2, less fatigued HEENT: Normocephalic, atraumatic, mucous membranes slightly dry Cardiovascular: Regular rate and rhythm, S1-S2 Respiratory: Clear to auscultation bilaterally Abdomen: Soft, nontender, nondistended, positive bowel sounds Musculoskeletal: No clubbing or cyanosis, trace pitting edema.  Some generalized weakness 4+/5 all extremities without any deficit Skin: Pressure ulceration on buttocks with some evidence of tunneling, Small wound on L heel and L great  toe (all present on admission) Psychiatry: Appropriate, no evidence of psychoses Neurology: No focal deficits   Data Reviewed: CBC: Recent Labs    Units 12/20/23 0436 12/19/23 0444 12/17/23 1548  WBC thou/mcL 9.1 8.1 9.0  NEUTROABS thou/mcL   --   --  7.36  HGB gm/dL 88.3* 88.9* 89.1*  HCT % 36.5* 34.3* 32.9*  MCV fL 103.1* 102.1* 101.2*  PLT thou/mcL 328 340 347   Basic Metabolic Panel: Recent Labs    Units 12/22/23 1422 12/22/23 0846 12/22/23 0200 12/21/23 1944 12/21/23 1713 12/20/23 0756 12/20/23 0436 12/19/23 0846 12/19/23 0444 12/18/23 1209 12/17/23 1548  NA mmol/L  --   --  135*  --   --   --  136  --  138  --  136  K mmol/L  --   --  3.8  --   --   --  4.4  --  4.9  --  4.7  CL mmol/L  --   --  98  --   --   --  99  --  103  --  105  CO2 mmol/L  --   --  26  --   --   --  28  --  26  --  20  GLUCOSE mg/dL 767* 855* 854* 742* 833*   < > 122*   < > 160*   < > 163*  BUN mg/dL  --   --  19  --   --   --  15  --  15  --  25  CREATININE mg/dL  --   --  9.01  --   --   --  1.00  --  0.93  --  0.89  CALCIUM  mg/dL  --   --  89.9  --   --   --  10.2  --  9.9  --  10.2   < > = values in this interval not displayed.   GFR: Estimated Creatinine Clearance: 54 mL/min (by C-G formula based on SCr of 0.98 mg/dL). Liver Function Tests: Recent Labs    Units 12/17/23 1548  AST U/L 38  ALT U/L 18  ALKPHOS U/L 108  BILITOT mg/dL 0.7  ALBUMIN gm/dL 3.7   No results for input(s): LIPASE, AMYLASE in the last 168 hours. Recent Labs    Units 12/21/23 1141  AMMONIA mcmol/L 12*   Coagulation Profile: No results for input(s): INR, PROTIME in the last 168 hours. Cardiac Enzymes: No results for input(s): CKMB, TROPONINI in the last 168 hours.  Urine analysis:    Component Value Date/Time   GLUCOSEU 70 (A) 12/17/2023 1718   BILIRUBINUR Negative 12/17/2023 1718   UROBILINOGEN <2 12/17/2023 1718   NITRITE Negative 12/17/2023 1718      Studies: Scheduled Meds:  apixaban   5 mg Oral BID   atorvastatin   40 mg Oral Daily   brimonidine  (ALPHAGAN ) 0.2%, timolol  maleate (TIMOPTIC ) 0.5% TI for COMBIGAN  0.2%/0.5%   Both Eyes Q12H   clotrimazole-betamethasone   Topical BID   docusate sodium   100 mg Oral  BID   hydrALAZINE  HCl  10 mg Oral Q8H SCH   hydroCHLOROthiazide   12.5 mg Oral Daily   insulin  lispro (HUMALOG,ADMELOG)  2 Units Subcutaneous Meals   insulin  lispro (HUMALOG,ADMELOG)  2-12 Units Subcutaneous Meals   lactulose  20 g Oral ONCE   lisinopril   20 mg Oral Daily   melatonin  2 mg Oral HS   NaCl  10 mL Intracatheter Q12H [facility]  Continuous Infusions:  NaCl       LOS: 3 days     Sendil Krishnan, MD [facility]   12/22/2023, 3:12 PM

## 2023-12-22 NOTE — Progress Notes (Signed)
°   12/22/23 1210  Clinical Encounter Type  Visited With: Charleston Kubas  Visit Type: Follow-up;Advance Directive  Situation: Ongoing spiritual support  Referral From:  Loved Ones;Nurse  Plan  Plan of Care: Continued visits/Support;Address Unmet Needs (Comment)  Address Unmet Needs (Comment): Assist with Advanced Directive creation if/when appropriate.   This chaplain visited briefly with the patient's daughter, [name] as she walked to the cafe. She shares that her father's condition appears to be improving, but he is not yet back to his baseline. She feels encouraged by this and she also understands that he needs more rest and care.  That said, she plans to be in touch if/when her father's condition improves.   Chaplain staff will continue to support as needed/desired this admission.  Alfonso Bohr, Chaplain

## 2023-12-22 NOTE — Progress Notes (Signed)
 [facility] Case Management     Patient:   Travis [name] MR Number:  [id number] Patient Date of Birth: [birth year] Age/Sex:  83 y.o./male     F/u with pt's daughter.  I let her know I still have not heard back from [facility] but [facility] can offer a bed.  She spoke with the pt at bedside while I was on the phone with her and the pt is agreeable to go to [facility] if he is unable to get into [facility].  I provided the daughter with the address and let them know that I will make a 4th attempt to call [facility] but if we have not heard back by 1100 in the morning, we will need to move forward with the [facility] bed offer.  They are in agreement with this plan.     Message left on Leita Glee VM [phone], requesting return call back ASAP to let me know if they got the referral and if their program would be an option for this pt.  I noted pt has been approved to go to [facility] but I would like to try to get him where he wants to go as he lives in [place] and has been there before.    5th message left on [facility] referral line [phone] noting the same thing I left on Laura's VM but I also noted this is my 5th message since yesterday between calling Laura, Katelyn and the referral line.  I noted I need an answer by 1100 or we will need to move forward with [facility].  CM will continue to follow to assist.    Electronically signed: Burnard LITTIE Holland, RN, BSN 12/22/2023 / 4:16 PM

## 2023-12-22 NOTE — Progress Notes (Addendum)
 [facility] Case Management     Patient:   Sheikh Leverich MR Number:  [id number] Patient Date of Birth: Jul 28, 1940 Age/Sex:  83 y.o./male     Message left on [facility]'s VM [phone], requesting return call back ASAP and also noted I need to confirm they received referral.    @ 1414, message left on Katelyn with [facility]'s VM, requesting return call back asap to confirm if they got referral and if pt is a candidate for their program.    @ 1421, spoke with Delon [phone], [facility]; explained situation and informed her [facility] has not called us  back.  She will review referral and let me know if they would be an option as a backup.    @ 1425, spoke with pt's daughter, [name] and provided her an update.  She is agreeable to moving forward with [facility] as a backup and if [facility] does call back saying they can accept, we can switch directions.  She inquired about looking into pt's VA benefits.  I provided her with the [employer] (VBA) [phone] and the [employer] (VSO) [phone].   @ [phone], received a call back from [place] at [facility], letting me know they can offer a bed on this patient and just to let her know.    Electronically signed: Burnard LITTIE Holland, RN, BSN 12/22/2023 / 10:42 AM

## 2023-12-23 ENCOUNTER — Other Ambulatory Visit: Payer: Self-pay

## 2023-12-23 ENCOUNTER — Other Ambulatory Visit (HOSPITAL_BASED_OUTPATIENT_CLINIC_OR_DEPARTMENT_OTHER): Payer: Self-pay

## 2023-12-23 MED ORDER — CLOTRIMAZOLE-BETAMETHASONE 1-0.05 % EX CREA
1.0000 | TOPICAL_CREAM | Freq: Two times a day (BID) | CUTANEOUS | 0 refills | Status: AC
  Filled 2023-12-23: qty 30, 60d supply, fill #0

## 2023-12-23 MED ORDER — HYDRALAZINE HCL 10 MG PO TABS
10.0000 mg | ORAL_TABLET | Freq: Three times a day (TID) | ORAL | 0 refills | Status: DC
  Filled 2023-12-23: qty 120, 40d supply, fill #0

## 2023-12-23 MED ORDER — METHOCARBAMOL 500 MG PO TABS
250.0000 mg | ORAL_TABLET | Freq: Four times a day (QID) | ORAL | 0 refills | Status: DC | PRN
  Filled 2023-12-23: qty 30, 15d supply, fill #0

## 2023-12-24 ENCOUNTER — Other Ambulatory Visit (HOSPITAL_BASED_OUTPATIENT_CLINIC_OR_DEPARTMENT_OTHER): Payer: Self-pay

## 2023-12-24 MED ORDER — METFORMIN HCL 500 MG PO TABS
1000.0000 mg | ORAL_TABLET | Freq: Two times a day (BID) | ORAL | 0 refills | Status: DC
  Filled 2023-12-24: qty 60, 15d supply, fill #0

## 2023-12-30 ENCOUNTER — Other Ambulatory Visit (HOSPITAL_BASED_OUTPATIENT_CLINIC_OR_DEPARTMENT_OTHER): Payer: Self-pay

## 2023-12-30 MED ORDER — METFORMIN HCL 1000 MG PO TABS
1000.0000 mg | ORAL_TABLET | Freq: Two times a day (BID) | ORAL | 0 refills | Status: DC
  Filled 2023-12-30: qty 60, 30d supply, fill #0

## 2023-12-30 MED ORDER — PEG 3350 17 GM/SCOOP PO POWD
17.0000 g | Freq: Every day | ORAL | 0 refills | Status: AC | PRN
  Filled 2023-12-30: qty 238, 14d supply, fill #0

## 2023-12-30 MED ORDER — ELIQUIS 5 MG PO TABS
5.0000 mg | ORAL_TABLET | Freq: Two times a day (BID) | ORAL | 0 refills | Status: AC
  Filled 2023-12-30: qty 60, 30d supply, fill #0

## 2023-12-30 MED ORDER — CLOTRIMAZOLE-BETAMETHASONE 1-0.05 % EX CREA
1.0000 | TOPICAL_CREAM | Freq: Two times a day (BID) | CUTANEOUS | 0 refills | Status: DC
  Filled 2023-12-30: qty 15, 30d supply, fill #0

## 2023-12-30 MED ORDER — ATORVASTATIN CALCIUM 40 MG PO TABS
40.0000 mg | ORAL_TABLET | Freq: Every day | ORAL | 0 refills | Status: AC
  Filled 2023-12-30: qty 30, 30d supply, fill #0

## 2023-12-30 MED ORDER — MELATONIN 3 MG PO TABS
3.0000 mg | ORAL_TABLET | Freq: Every day | ORAL | 0 refills | Status: AC
  Filled 2023-12-30: qty 60, 60d supply, fill #0

## 2023-12-30 MED ORDER — HYDRALAZINE HCL 10 MG PO TABS
10.0000 mg | ORAL_TABLET | Freq: Three times a day (TID) | ORAL | 0 refills | Status: DC
  Filled 2023-12-30: qty 90, 30d supply, fill #0

## 2023-12-30 MED ORDER — LISINOPRIL 20 MG PO TABS
20.0000 mg | ORAL_TABLET | Freq: Every day | ORAL | 0 refills | Status: DC
  Filled 2023-12-30: qty 30, 30d supply, fill #0

## 2023-12-30 MED ORDER — BRIMONIDINE TARTRATE-TIMOLOL 0.2-0.5 % OP SOLN
1.0000 [drp] | Freq: Two times a day (BID) | OPHTHALMIC | 0 refills | Status: AC
  Filled 2023-12-30: qty 5, 50d supply, fill #0

## 2023-12-30 MED ORDER — PANTOPRAZOLE SODIUM 40 MG PO TBEC
40.0000 mg | DELAYED_RELEASE_TABLET | Freq: Two times a day (BID) | ORAL | 0 refills | Status: AC
  Filled 2023-12-30: qty 60, 30d supply, fill #0

## 2024-01-11 ENCOUNTER — Other Ambulatory Visit (HOSPITAL_BASED_OUTPATIENT_CLINIC_OR_DEPARTMENT_OTHER): Payer: Self-pay

## 2024-01-14 ENCOUNTER — Other Ambulatory Visit (HOSPITAL_BASED_OUTPATIENT_CLINIC_OR_DEPARTMENT_OTHER): Payer: Self-pay

## 2024-01-19 ENCOUNTER — Encounter: Payer: Self-pay | Admitting: Cardiovascular Disease

## 2024-01-21 ENCOUNTER — Ambulatory Visit: Payer: Medicare Other

## 2024-01-21 DIAGNOSIS — I495 Sick sinus syndrome: Secondary | ICD-10-CM | POA: Diagnosis not present

## 2024-01-22 ENCOUNTER — Emergency Department (HOSPITAL_COMMUNITY)

## 2024-01-22 ENCOUNTER — Inpatient Hospital Stay (HOSPITAL_COMMUNITY)
Admission: EM | Admit: 2024-01-22 | Discharge: 2024-01-27 | DRG: 071 | Disposition: A | Discharge status: Skilled Nursing Facility | Attending: Family Medicine | Admitting: Family Medicine

## 2024-01-22 ENCOUNTER — Observation Stay (HOSPITAL_COMMUNITY)

## 2024-01-22 ENCOUNTER — Other Ambulatory Visit: Payer: Self-pay

## 2024-01-22 DIAGNOSIS — I4819 Other persistent atrial fibrillation: Secondary | ICD-10-CM | POA: Diagnosis present

## 2024-01-22 DIAGNOSIS — Z79899 Other long term (current) drug therapy: Secondary | ICD-10-CM

## 2024-01-22 DIAGNOSIS — E785 Hyperlipidemia, unspecified: Secondary | ICD-10-CM | POA: Diagnosis present

## 2024-01-22 DIAGNOSIS — Z9841 Cataract extraction status, right eye: Secondary | ICD-10-CM

## 2024-01-22 DIAGNOSIS — Z961 Presence of intraocular lens: Secondary | ICD-10-CM | POA: Diagnosis present

## 2024-01-22 DIAGNOSIS — E119 Type 2 diabetes mellitus without complications: Secondary | ICD-10-CM

## 2024-01-22 DIAGNOSIS — Z1152 Encounter for screening for COVID-19: Secondary | ICD-10-CM

## 2024-01-22 DIAGNOSIS — E860 Dehydration: Secondary | ICD-10-CM | POA: Diagnosis present

## 2024-01-22 DIAGNOSIS — R7982 Elevated C-reactive protein (CRP): Secondary | ICD-10-CM | POA: Diagnosis present

## 2024-01-22 DIAGNOSIS — Z7901 Long term (current) use of anticoagulants: Secondary | ICD-10-CM

## 2024-01-22 DIAGNOSIS — L89629 Pressure ulcer of left heel, unspecified stage: Secondary | ICD-10-CM | POA: Diagnosis present

## 2024-01-22 DIAGNOSIS — G9340 Encephalopathy, unspecified: Secondary | ICD-10-CM | POA: Diagnosis not present

## 2024-01-22 DIAGNOSIS — I495 Sick sinus syndrome: Secondary | ICD-10-CM | POA: Diagnosis present

## 2024-01-22 DIAGNOSIS — Z950 Presence of cardiac pacemaker: Secondary | ICD-10-CM

## 2024-01-22 DIAGNOSIS — I2510 Atherosclerotic heart disease of native coronary artery without angina pectoris: Secondary | ICD-10-CM | POA: Diagnosis present

## 2024-01-22 DIAGNOSIS — I129 Hypertensive chronic kidney disease with stage 1 through stage 4 chronic kidney disease, or unspecified chronic kidney disease: Secondary | ICD-10-CM | POA: Diagnosis present

## 2024-01-22 DIAGNOSIS — G9341 Metabolic encephalopathy: Principal | ICD-10-CM | POA: Diagnosis present

## 2024-01-22 DIAGNOSIS — Z96641 Presence of right artificial hip joint: Secondary | ICD-10-CM | POA: Diagnosis present

## 2024-01-22 DIAGNOSIS — R4189 Other symptoms and signs involving cognitive functions and awareness: Secondary | ICD-10-CM

## 2024-01-22 DIAGNOSIS — M6282 Rhabdomyolysis: Secondary | ICD-10-CM | POA: Diagnosis present

## 2024-01-22 DIAGNOSIS — L89619 Pressure ulcer of right heel, unspecified stage: Secondary | ICD-10-CM | POA: Diagnosis present

## 2024-01-22 DIAGNOSIS — Z87891 Personal history of nicotine dependence: Secondary | ICD-10-CM

## 2024-01-22 DIAGNOSIS — I2489 Other forms of acute ischemic heart disease: Secondary | ICD-10-CM | POA: Diagnosis present

## 2024-01-22 DIAGNOSIS — I10 Essential (primary) hypertension: Secondary | ICD-10-CM

## 2024-01-22 DIAGNOSIS — I878 Other specified disorders of veins: Secondary | ICD-10-CM | POA: Diagnosis present

## 2024-01-22 DIAGNOSIS — R4182 Altered mental status, unspecified: Principal | ICD-10-CM

## 2024-01-22 DIAGNOSIS — R531 Weakness: Secondary | ICD-10-CM

## 2024-01-22 DIAGNOSIS — E1140 Type 2 diabetes mellitus with diabetic neuropathy, unspecified: Secondary | ICD-10-CM | POA: Diagnosis present

## 2024-01-22 DIAGNOSIS — K219 Gastro-esophageal reflux disease without esophagitis: Secondary | ICD-10-CM | POA: Diagnosis present

## 2024-01-22 DIAGNOSIS — N1831 Chronic kidney disease, stage 3a: Secondary | ICD-10-CM | POA: Diagnosis present

## 2024-01-22 DIAGNOSIS — Z7984 Long term (current) use of oral hypoglycemic drugs: Secondary | ICD-10-CM

## 2024-01-22 DIAGNOSIS — E1122 Type 2 diabetes mellitus with diabetic chronic kidney disease: Secondary | ICD-10-CM | POA: Diagnosis present

## 2024-01-22 LAB — BLOOD GAS, VENOUS
Acid-base deficit: 1.2 mmol/L (ref 0.0–2.0)
Bicarbonate: 23.6 mmol/L (ref 20.0–28.0)
O2 Saturation: 48.1 %
Patient temperature: 37
pCO2, Ven: 39 mmHg — ABNORMAL LOW (ref 44–60)
pH, Ven: 7.39 (ref 7.25–7.43)
pO2, Ven: 32 mmHg (ref 32–45)

## 2024-01-22 LAB — CBC WITH DIFFERENTIAL/PLATELET
Abs Immature Granulocytes: 0.03 K/uL (ref 0.00–0.07)
Basophils Absolute: 0 K/uL (ref 0.0–0.1)
Basophils Relative: 0 %
Eosinophils Absolute: 0 K/uL (ref 0.0–0.5)
Eosinophils Relative: 0 %
HCT: 34.3 % — ABNORMAL LOW (ref 39.0–52.0)
Hemoglobin: 11.5 g/dL — ABNORMAL LOW (ref 13.0–17.0)
Immature Granulocytes: 0 %
Lymphocytes Relative: 5 %
Lymphs Abs: 0.5 K/uL — ABNORMAL LOW (ref 0.7–4.0)
MCH: 32.3 pg (ref 26.0–34.0)
MCHC: 33.5 g/dL (ref 30.0–36.0)
MCV: 96.3 fL (ref 80.0–100.0)
Monocytes Absolute: 0.8 K/uL (ref 0.1–1.0)
Monocytes Relative: 8 %
Neutro Abs: 8.5 K/uL — ABNORMAL HIGH (ref 1.7–7.7)
Neutrophils Relative %: 87 %
Platelets: 342 K/uL (ref 150–400)
RBC: 3.56 MIL/uL — ABNORMAL LOW (ref 4.22–5.81)
RDW: 13.6 % (ref 11.5–15.5)
WBC: 9.8 K/uL (ref 4.0–10.5)
nRBC: 0 % (ref 0.0–0.2)

## 2024-01-22 LAB — URINALYSIS, W/ REFLEX TO CULTURE (INFECTION SUSPECTED)
Bilirubin Urine: NEGATIVE
Glucose, UA: NEGATIVE mg/dL
Ketones, ur: 20 mg/dL — AB
Leukocytes,Ua: NEGATIVE
Nitrite: NEGATIVE
Protein, ur: 100 mg/dL — AB
Specific Gravity, Urine: 1.017 (ref 1.005–1.030)
pH: 5 (ref 5.0–8.0)

## 2024-01-22 LAB — COMPREHENSIVE METABOLIC PANEL WITH GFR
ALT: 26 U/L (ref 0–44)
AST: 58 U/L — ABNORMAL HIGH (ref 15–41)
Albumin: 3.6 g/dL (ref 3.5–5.0)
Alkaline Phosphatase: 103 U/L (ref 38–126)
Anion gap: 14 (ref 5–15)
BUN: 24 mg/dL — ABNORMAL HIGH (ref 8–23)
CO2: 22 mmol/L (ref 22–32)
Calcium: 10.4 mg/dL — ABNORMAL HIGH (ref 8.9–10.3)
Chloride: 103 mmol/L (ref 98–111)
Creatinine, Ser: 1.1 mg/dL (ref 0.61–1.24)
GFR, Estimated: >60 mL/min
Glucose, Bld: 178 mg/dL — ABNORMAL HIGH (ref 70–99)
Potassium: 3.8 mmol/L (ref 3.5–5.1)
Sodium: 139 mmol/L (ref 135–145)
Total Bilirubin: 1.3 mg/dL — ABNORMAL HIGH (ref 0.0–1.2)
Total Protein: 6.8 g/dL (ref 6.5–8.1)

## 2024-01-22 LAB — TROPONIN T, HIGH SENSITIVITY
Troponin T High Sensitivity: 80 ng/L — ABNORMAL HIGH (ref 0–19)
Troponin T High Sensitivity: 59 ng/L — ABNORMAL HIGH (ref 0–19)

## 2024-01-22 LAB — PROCALCITONIN: Procalcitonin: 0.18 ng/mL

## 2024-01-22 LAB — PHOSPHORUS: Phosphorus: 3.3 mg/dL (ref 2.5–4.6)

## 2024-01-22 LAB — TSH: TSH: 1.75 u[IU]/mL (ref 0.350–4.500)

## 2024-01-22 LAB — MAGNESIUM: Magnesium: 1.5 mg/dL — ABNORMAL LOW (ref 1.7–2.4)

## 2024-01-22 LAB — PRO BRAIN NATRIURETIC PEPTIDE: Pro Brain Natriuretic Peptide: 1829 pg/mL — ABNORMAL HIGH

## 2024-01-22 LAB — RESP PANEL BY RT-PCR (RSV, FLU A&B, COVID)  RVPGX2
Influenza A by PCR: NEGATIVE
Influenza B by PCR: NEGATIVE
Resp Syncytial Virus by PCR: NEGATIVE
SARS Coronavirus 2 by RT PCR: NEGATIVE

## 2024-01-22 LAB — CK: Total CK: 997 U/L — ABNORMAL HIGH (ref 49–397)

## 2024-01-22 LAB — LACTIC ACID, PLASMA: Lactic Acid, Venous: 1.9 mmol/L (ref 0.5–1.9)

## 2024-01-22 LAB — C-REACTIVE PROTEIN: CRP: 13.8 mg/dL — ABNORMAL HIGH

## 2024-01-22 LAB — CBG MONITORING, ED: Glucose-Capillary: 145 mg/dL — ABNORMAL HIGH (ref 70–99)

## 2024-01-22 MED ORDER — ACETAMINOPHEN 650 MG RE SUPP: 650.0000 mg | Freq: Four times a day (QID) | RECTAL | Status: DC | PRN

## 2024-01-22 MED ORDER — SODIUM CHLORIDE 0.9 % IV SOLN: INTRAVENOUS | Status: DC

## 2024-01-22 MED ORDER — ONDANSETRON HCL 4 MG/2ML IJ SOLN: 4.0000 mg | Freq: Four times a day (QID) | INTRAMUSCULAR | Status: DC | PRN

## 2024-01-22 MED ORDER — INSULIN ASPART 100 UNIT/ML IJ SOLN
0.0000 [IU] | Freq: Three times a day (TID) | INTRAMUSCULAR | Status: DC
  Administered 2024-01-25: 2 [IU] via SUBCUTANEOUS
  Administered 2024-01-26 (×2): 1 [IU] via SUBCUTANEOUS
  Administered 2024-01-26: 3 [IU] via SUBCUTANEOUS
  Administered 2024-01-27: 2 [IU] via SUBCUTANEOUS
  Administered 2024-01-27: 1 [IU] via SUBCUTANEOUS
  Filled 2024-01-22: qty 3
  Filled 2024-01-22: qty 2
  Filled 2024-01-22 (×2): qty 1
  Filled 2024-01-22: qty 2

## 2024-01-22 MED ORDER — MAGNESIUM SULFATE 2 GM/50ML IV SOLN
2.0000 g | Freq: Once | INTRAVENOUS | Status: AC
  Administered 2024-01-23: 2 g via INTRAVENOUS
  Filled 2024-01-22: qty 50

## 2024-01-22 MED ORDER — ONDANSETRON HCL 4 MG PO TABS: 4.0000 mg | ORAL_TABLET | Freq: Four times a day (QID) | ORAL | Status: DC | PRN

## 2024-01-22 MED ORDER — SODIUM CHLORIDE 0.9 % IV SOLN: INTRAVENOUS | Status: AC

## 2024-01-22 MED ORDER — ACETAMINOPHEN 325 MG PO TABS
650.0000 mg | ORAL_TABLET | Freq: Four times a day (QID) | ORAL | Status: DC | PRN
  Administered 2024-01-22 – 2024-01-26 (×4): 650 mg via ORAL
  Filled 2024-01-22 (×4): qty 2

## 2024-01-22 MED ORDER — LACTATED RINGERS IV BOLUS: 1000.0000 mL | Freq: Once | INTRAVENOUS | Status: DC

## 2024-01-22 NOTE — H&P (Signed)
 " History and Physical    Patient: Travis [name] FMW:[id number] DOB: 10/17/1940 DOA: 01/22/2024 DOS: the patient was seen and examined on 01/22/2024 PCP: Tisovec, Charlie ORN, MD  Patient coming from: Home - lives alone. Family checks on him frequently. Uses walker at times to ambulate    Chief Complaint: AMS, found down on ground   HPI: Travis [name] is a 84 y.o. male with medical history significant of HTN, HLD, SSS s/p PPM, T2DM, PAF on eliquis  who presented to ED for AMS after being found sitting on floor, covered in feces for unknown amount of time. Last seen normal on 01/19/24. He also was hallucinating and thought people were trying to break into the house.  He tells me he remembers he was confused, but is back to baseline now. He states this has been going on where he is doing good and then gets confused for a few days then is better. He denies falling. He denies any pain except for the pressure sores on his feet. He denies any fever/chills, chest pain/palpitations, shortness of breath or cough, N/V/D or abdominal pain. He states he will occasionally feel light headed or dizzy. He states this happens when he is at home and in the afternoon. He tends to be moving around. This will last for 1 hour or so. He isn't currently dizzy.   He was admitted at [facility] 12/5 for weakness/rule out stroke. CT head, CTA head/neck and DVT studies wnl. MRI was ordered, but I can not see results for this. PPM was interrogated. He had negative work up for his confusion.    Denies any fever/chills, vision changes/headaches, chest pain or palpitations, shortness of breath or cough, abdominal pain, N/V/D, dysuria or leg swelling.    He does not smoke or drink alcohol.   ER Course:  vitals: afebrile, bp: 193/82, HR: 89, RR: 16, oxygen: 100%RA Pertinent labs: AST: 58, CK: 997, troponin 80>59, UA: moderate hgb, 20 ketones, 100 protein,  CXR: no acute findings. Moderate hiatal  hernia.  CT head: no acute finding.  Parenchymal volume loss commensurate with the patient's age. 3. Periventricular white matter changes likely reflecting the sequela of small vessel ischemia. In ED: 1L IVF bolus ordered. TRH asked to admit.    Review of Systems: As mentioned in the history of present illness. All other systems reviewed and are negative. 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   SSS (sick sinus syndrome) (HCC) 06/10/2016   Type II diabetes mellitus (HCC)    Past Surgical History:  Procedure Laterality Date   APPENDECTOMY     CARDIAC CATHETERIZATION  06/10/2016   CARDIOVERSION N/A 10/01/2023   Procedure: CARDIOVERSION;  Surgeon: Francyne Headland, MD;  Location: [facility];  Service: Cardiovascular;  Laterality: N/A;   CATARACT EXTRACTION W/ INTRAOCULAR LENS IMPLANT Right    CYSTOSCOPY W/ STONE MANIPULATION     INSERT / REPLACE / REMOVE PACEMAKER  06/10/2016   dual chamber permanent pacemaker   KNEE ARTHROSCOPY W/ MENISCECTOMY Bilateral    LAPAROSCOPIC CHOLECYSTECTOMY     LEFT HEART CATH AND CORONARY ANGIOGRAPHY N/A 06/10/2016   Procedure: Left Heart Cath and Coronary Angiography;  Surgeon: Cherrie Toribio SAUNDERS, MD;  Location: [facility];  Service: Cardiovascular;  Laterality: N/A;   LITHOTRIPSY     PACEMAKER IMPLANT N/A 06/10/2016   Procedure: Pacemaker Implant;  Surgeon: Francyne Headland, MD;  Location: [facility];  Service: Cardiovascular;  Laterality: N/A;   TONSILLECTOMY     TOTAL HIP ARTHROPLASTY Right 10/31/2023   Procedure: RIGHT TOTAL HIP ARTHROPLASTY,  ANTERIOR APPROACH;  Surgeon: Ernie Cough, MD;  Location: [facility] OR;  Service: Orthopedics;  Laterality: Right;   Social History:  reports that he quit smoking about 43 years ago.  His smoking use included cigarettes. He started smoking about 68 years ago. He has a 12.5 pack-year smoking history. He has never used smokeless tobacco. He reports current alcohol use. He reports that he does not use drugs.  Allergies[1]  Family History  Problem Relation Age of Onset   Colon cancer Father     Prior to Admission medications  Medication Sig Start Date End Date Taking? Authorizing Provider  acetaminophen  (TYLENOL ) 325 MG tablet Take 2 tablets (650 mg total) by mouth every 6 (six) hours as needed for mild pain (pain score 1-3). 11/16/23   Angiulli, Toribio PARAS, PA-C  amLODipine  (NORVASC ) 5 MG tablet Take 1 tablet (5 mg total) by mouth daily. 11/19/23   Angiulli, Toribio PARAS, PA-C  apixaban  (ELIQUIS ) 5 MG TABS tablet Take 1 tablet (5 mg total) by mouth 2 (two) times daily. 12/30/23     ascorbic acid  (VITAMIN C ) 500 MG tablet Take 1 tablet (500 mg total) by mouth daily. 11/19/23   Angiulli, Toribio PARAS, PA-C  atorvastatin  (LIPITOR) 40 MG tablet Take 1 tablet (40 mg total) by mouth daily. 12/30/23     brimonidine -timolol  (COMBIGAN ) 0.2-0.5 % ophthalmic solution Place 1 drop into both eyes 2 (two) times daily. 06/14/23     brimonidine -timolol  (COMBIGAN ) 0.2-0.5 % ophthalmic solution Place 1 drop into both eyes 2 (two) times daily. 12/30/23     vitamin D3 (CHOLECALCIFEROL ) 25 MCG tablet Take 1 tablet (1,000 Units total) by mouth daily. 11/19/23   Angiulli, Daniel J, PA-C  clotrimazole -betamethasone  (LOTRISONE ) cream Apply to the affected area(s) topically 2 (two) times daily. 12/23/23     clotrimazole -betamethasone  (LOTRISONE ) cream Apply to the affected area(s) topically 2 (two) times daily. 12/30/23     collagenase  (SANTYL ) 250 UNIT/GM ointment Apply topically daily. Apply to affected areas as directed 11/19/23   Angiulli, Toribio PARAS, PA-C  diclofenac  Sodium (VOLTAREN ) 1 % GEL Apply 2 g topically 4 (four) times daily. 11/19/23   Angiulli, Toribio PARAS, PA-C  ezetimibe  (ZETIA ) 10 MG tablet Take 1 tablet  (10 mg total) by mouth daily. 11/19/23   Angiulli, Toribio PARAS, PA-C  hydrALAZINE  (APRESOLINE ) 10 MG tablet Take 1 tablet (10 mg total) by mouth every 8 (eight) hours. 12/23/23     hydrALAZINE  (APRESOLINE ) 10 MG tablet Take 1 tablet (10 mg total) by mouth 3 (three) times daily. 12/30/23     HYDROcodone -acetaminophen  (NORCO/VICODIN) 5-325 MG tablet Take 1 tablet by mouth every 6 (six) hours as needed for moderate pain (pain score 4-6). 11/19/23   Angiulli, Toribio PARAS, PA-C  lisinopril  (ZESTRIL ) 20 MG tablet Take 1 tablet (20 mg total) by mouth daily. 12/30/23     melatonin (FT MELATONIN) 3 MG TABS tablet Take 1 tablet (3 mg total) by mouth at bedtime. 12/30/23     metFORMIN  (GLUCOPHAGE ) 1000 MG tablet Take 1 tablet (1,000 mg total) by mouth 2 (two) times daily with meals. 12/30/23     metFORMIN  (GLUCOPHAGE ) 500  MG tablet Take 2 tablets (1,000 mg total) by mouth 2 (two) times daily. 12/24/23     methocarbamol  (ROBAXIN ) 500 MG tablet Take 0.5 tablets (250 mg total) by mouth 4 (four) times daily as needed. 12/23/23     pantoprazole  (PROTONIX ) 40 MG tablet Take 1 tablet (40 mg total) by mouth 2 (two) times daily before meals. 12/30/23     polyethylene glycol (MIRALAX  / GLYCOLAX ) 17 g packet Take 17 g by mouth daily as needed for mild constipation. 11/16/23   Angiulli, Toribio PARAS, PA-C  Polyethylene Glycol 3350  (PEG 3350 ) 17 GM/SCOOP POWD Take 17 g by mouth daily as needed. 12/30/23     senna-docusate (SENOKOT-S) 8.6-50 MG tablet Take 2 tablets by mouth 2 (two) times daily. 11/16/23   Angiulli, Toribio PARAS, PA-C  Zinc  Sulfate 220 (50 Zn) MG TABS Take 1 tablet (220 mg total) by mouth daily. 11/19/23   Pegge Toribio PARAS, PA-C    Physical Exam: Vitals:   01/22/24 1742 01/22/24 1800 01/22/24 2115 01/22/24 2159  BP: (!) 193/82 (!) 171/92 (!) 163/74   Pulse: 89 92 96   Resp: 16 20 14    Temp: 97.9 F (36.6 C)   98 F (36.7 C)  TempSrc: Oral     SpO2: 100% 100% 100%    General:  Appears calm and comfortable and is  in NAD Eyes:  PERRL, EOMI, normal lids, iris ENT:  HOH, lips & tongue, mmm; appropriate dentition Neck:  no LAD, masses or thyromegaly; no carotid bruits Cardiovascular:  RRR, no m/r/g. 3+ pitting LE edema. L>R Respiratory:   CTA bilaterally with no wheezes/rales/rhonchi.  Normal respiratory effort. Abdomen:  soft, NT, ND, NABS Back:   normal alignment, no CVAT Skin:  no rash or induration seen on limited exam Musculoskeletal:  grossly normal tone BUE/BLE, good ROM, no bony abnormality Lower extremity:   Limited foot exam with no ulcerations.  1+ distal pulses.He has wound that is healed at distal end of great toe, but complains of pain. Black scab on top  Psychiatric:  grossly normal mood and affect, speech fluent and appropriate, AOx3 Neurologic:  CN 2-12 grossly intact, moves all extremities in coordinated fashion, sensation intact   Radiological Exams on Admission: Independently reviewed - see discussion in A/P where applicable  CT Head Wo Contrast Result Date: 01/22/2024 EXAM: CT HEAD WITHOUT CONTRAST 01/22/2024 06:34:20 PM TECHNIQUE: CT of the head was performed without the administration of intravenous contrast. Automated exposure control, iterative reconstruction, and/or weight based adjustment of the mA/kV was utilized to reduce the radiation dose to as low as reasonably achievable. COMPARISON: None available. CLINICAL HISTORY: Head trauma, minor (Age >= 65y); Mental status change, unknown cause. FINDINGS: BRAIN AND VENTRICLES: No acute hemorrhage. No evidence of acute infarct. No hydrocephalus. No extra-axial collection. No mass effect or midline shift. Parenchymal volume loss is commensurate with the patient's age. Periventricular white matter changes are present likely reflecting the sequela of small vessel ischemia. ORBITS: No acute abnormality. SINUSES: No acute abnormality. SOFT TISSUES AND SKULL: No acute soft tissue abnormality. No skull fracture. IMPRESSION: 1. No acute  intracranial abnormality. 2. Parenchymal volume loss commensurate with the patient's age. 3. Periventricular white matter changes likely reflecting the sequela of small vessel ischemia. Electronically signed by: Dorethia Molt MD MD 01/22/2024 06:39 PM EST RP Workstation: [id number]   DG Chest 2 View Result Date: 01/22/2024 EXAM: 2 VIEW(S) XRAY OF THE CHEST 01/22/2024 06:27:51 PM COMPARISON: 10/30/2023 CLINICAL HISTORY: AMS (altered mental status) FINDINGS: LINES, TUBES  AND DEVICES: Left subclavian cardiac rhythm maintenance device stable in position. LUNGS AND PLEURA: No focal pulmonary opacity. No pleural effusion. No pneumothorax. HEART AND MEDIASTINUM: Aortic atherosclerosis. Moderate hiatal hernia. No acute abnormality of the cardiac and mediastinal silhouettes. BONES AND SOFT TISSUES: Exaggerated thoracic kyphosis. No acute osseous abnormality. IMPRESSION: 1. No acute cardiopulmonary findings. 2. Stable left subclavian cardiac rhythm maintenance device. 3. Aortic atherosclerosis. 4. Moderate hiatal hernia. 5. Exaggerated thoracic kyphosis. Electronically signed by: Dorethia Molt MD MD 01/22/2024 06:36 PM EST RP Workstation: [id number]    EKG: Independently reviewed.  NSR with rate 87; nonspecific ST changes with no evidence of acute ischemia   Labs on Admission: I have personally reviewed the available labs and imaging studies at the time of the admission.  Pertinent labs:   AST: 58,  CK: 997,  troponin 80>59 UA: moderate hgb, 20 ketones, 100 protein  Assessment and Plan: Principal Problem:   Acute encephalopathy Active Problems:   Rhabdomyolysis   CAD with elevated troponin   Persistent atrial fibrillation (HCC)   Benign essential HTN   Type 2 diabetes mellitus without complication, without long-term current use of insulin  (HCC)   Venous stasis   SSS (sick sinus syndrome) (HCC)   Chronic kidney disease, stage 3a (HCC)    Assessment and Plan: * Acute encephalopathy 84 year old  male presenting to ED with acute encephalopathy after family found him in a chair at his house sitting in feces and with no clothes, unsure how long he had been there and having some hallucinations. He was last known well the day before.  -obs to tele at [facility] -work up so far insignificant except for mild rhabdo -he is back to A&O x4. Still reports people trespassing and setting up things in house which seems to likely not be true, but have not been able to confirm with family  -similar admit with no clear etiology for confusion at [facility] in 12/2023  -no signs of infection, no fever or SIRS criteria, will trend PCT. Lactic acid wnl  -head CT with no acute findings -troponin mildly bumped 80>59 with no chest pain or significant changes on EKG -interrogate PPM  -check ammonia, B12, TSH.  -denies syncope, but unsure since found in chair. Will check echo  -MRI brain and so admit to [facility] with PPM. Per [facility] states MRI ordered. Read in report normal.  -consider neurology consult vs. EEG. Also reports some memory decline   Rhabdomyolysis -Mild, CK of 997 -UA with hgb and mildly elevated AST  -continue IVF overnight -trend CK   CAD with elevated troponin Troponin flat 80>56 with no significant changes on EKG. He denies any chest pain or palpitations. Admits to some dizziness and light headedness at times  Denies any falls or passing out, but doesn't recall how long in chair and what happened Will interrogate PPM Check echo  Keep on tele  Continue medical management Hold statin with mild rhabdo   Benign essential HTN Unsure what he is taking Appears to be on lisinopril  and hydralazine  F/u on med rec   Persistent atrial fibrillation (HCC) In NSR Continue eliquis    Type 2 diabetes mellitus without complication, without long-term current use of insulin  (HCC) -A1C of 6.5 in may 2025  -hold metformin  -sensitive SSI and accuchecks QAC/HS   Venous stasis Compression hose Elevate legs  DVT  study negative for bilateral DVT 12/2023  C/o of pain in feet. Scab on left great toe, appears to be healing but will check xray  SSS (sick sinus syndrome) (HCC) S/p PPM   Chronic kidney disease, stage 3a (HCC) Creatinine wnl Continue to trend      Advance Care Planning:   Code Status: Full Code    Consultants: None    Procedures: CT head: 01/22/24   Antibiotics: None     DVT Prophylaxis: eliquis    Family Communication: left VM on daughters phone   Severity of Illness: The appropriate patient status for this patient is OBSERVATION. Observation status is judged to be reasonable and necessary in order to provide the required intensity of service to ensure the patient's safety. The patient's presenting symptoms, physical exam findings, and initial radiographic and laboratory data in the context of their medical condition is felt to place them at decreased risk for further clinical deterioration. Furthermore, it is anticipated that the patient will be medically stable for discharge from the hospital within 2 midnights of admission.   Author: Isaiah Geralds, MD 01/22/2024 11:05 PM  For on call review [email or web address].      [1] No Known Allergies  "

## 2024-01-22 NOTE — Assessment & Plan Note (Signed)
 S/p PPM

## 2024-01-22 NOTE — Assessment & Plan Note (Signed)
-  A1C of 6.5 in may 2025  -hold metformin  -sensitive SSI and accuchecks QAC/HS

## 2024-01-22 NOTE — Assessment & Plan Note (Signed)
 Creatinine wnl Continue to trend

## 2024-01-22 NOTE — Assessment & Plan Note (Addendum)
 Troponin flat 80>56 with no significant changes on EKG. He denies any chest pain or palpitations. Admits to some dizziness and light headedness at times  Denies any falls or passing out, but doesn't recall how long in chair and what happened Will interrogate PPM Check echo  Keep on tele  Continue medical management Hold statin with mild rhabdo

## 2024-01-22 NOTE — Assessment & Plan Note (Addendum)
 84 year old male presenting to ED with acute encephalopathy after family found him in a chair at his house sitting in feces and with no clothes, unsure how long he had been there and having some hallucinations. He was last known well the day before.  -obs to tele at [facility] -work up so far insignificant except for mild rhabdo -he is back to A&O x4. Still reports people trespassing and setting up things in house which seems to likely not be true, but have not been able to confirm with family  -similar admit with no clear etiology for confusion at [facility] in 12/2023  -no signs of infection, no fever or SIRS criteria, will trend PCT. Lactic acid wnl  -head CT with no acute findings -troponin mildly bumped 80>59 with no chest pain or significant changes on EKG -interrogate PPM  -check ammonia, B12, TSH.  -denies syncope, but unsure since found in chair. Will check echo  -MRI brain and so admit to [facility] with PPM. Per [facility] states MRI ordered. Read in report normal.  -consider neurology consult vs. EEG. Also reports some memory decline

## 2024-01-22 NOTE — ED Triage Notes (Addendum)
 BIBA for AMS, possible UTI, pressure wounds to bilateral feet and buttocks. Pt was found by family sitting on the floor, covered in feces, unaware of how long.Pt also reportedly was hallucinating, stating there were people trying to break into his house. Pt was last seen normal on 1/7. 20 g LAC, 800 cc LR given PTA. 180/80 bp 94 hr 160 cbg 14 etco2

## 2024-01-22 NOTE — Assessment & Plan Note (Signed)
 Unsure what he is taking Appears to be on lisinopril  and hydralazine  F/u on med rec

## 2024-01-22 NOTE — ED Provider Notes (Signed)
 " [place] EMERGENCY DEPARTMENT AT [facility] [address] Provider Note   CSN: [id number] Arrival date & time: 01/22/24  1729     History {Add pertinent medical, surgical, social history, OB history to HPI:1} Chief Complaint  Patient presents with   Altered Mental Status    Travis [name] is a 84 y.o. male with PMH as listed below who presents BIBA for AMS, possible UTI, pressure wounds to bilateral feet and buttocks. Pt was found by family sitting on the floor, covered in feces, unaware of how long. Pt also reportedly was hallucinating, stating there were people trying to break into his house. Pt was last seen normal on 1/7. 20 g LAC, 800 cc LR given PTA.  180/80 bp 94 hr 160 cbg 14 etco2.    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 Authorizing Provider  acetaminophen  (TYLENOL ) 325 MG tablet Take 2 tablets (650 mg total) by mouth every 6 (six) hours as needed for mild pain (pain score 1-3). 11/16/23   Angiulli, Toribio PARAS, PA-C  amLODipine  (NORVASC ) 5 MG tablet Take 1 tablet (5 mg total) by mouth daily. 11/19/23   Angiulli, Toribio PARAS, PA-C  apixaban  (ELIQUIS ) 5 MG TABS tablet Take 1 tablet (5 mg total) by mouth 2 (two) times daily. 12/30/23     ascorbic acid  (VITAMIN C ) 500 MG tablet Take 1 tablet (500 mg total) by mouth daily. 11/19/23   Angiulli, Toribio PARAS, PA-C  atorvastatin  (LIPITOR) 40 MG tablet Take 1 tablet (40 mg total) by mouth daily. 12/30/23     brimonidine -timolol  (COMBIGAN ) 0.2-0.5 % ophthalmic solution Place 1 drop into both eyes 2 (two) times  daily. 06/14/23     brimonidine -timolol  (COMBIGAN ) 0.2-0.5 % ophthalmic solution Place 1 drop into both eyes 2 (two) times daily. 12/30/23     vitamin D3 (CHOLECALCIFEROL ) 25 MCG tablet Take 1 tablet (1,000 Units total) by mouth daily. 11/19/23   Angiulli, Toribio PARAS, PA-C  clotrimazole -betamethasone  (LOTRISONE ) cream Apply to the affected area(s) topically 2 (two) times daily. 12/23/23     clotrimazole -betamethasone  (LOTRISONE ) cream Apply to the affected area(s) topically 2 (two) times daily. 12/30/23     collagenase  (SANTYL ) 250 UNIT/GM ointment Apply topically daily. Apply to affected areas as directed 11/19/23   Angiulli, Toribio PARAS, PA-C  diclofenac  Sodium (VOLTAREN ) 1 % GEL Apply 2 g topically 4 (four) times daily. 11/19/23   Angiulli, Toribio PARAS, PA-C  ezetimibe  (ZETIA ) 10 MG tablet Take 1 tablet (10 mg total) by mouth daily. 11/19/23   Angiulli, Toribio PARAS, PA-C  hydrALAZINE  (APRESOLINE ) 10 MG tablet Take 1 tablet (10 mg total) by mouth every 8 (eight) hours. 12/23/23     hydrALAZINE  (APRESOLINE ) 10 MG tablet Take 1 tablet (10 mg total) by mouth 3 (three) times daily. 12/30/23     HYDROcodone -acetaminophen  (NORCO/VICODIN) 5-325 MG tablet Take 1 tablet by mouth every 6 (six) hours as needed for moderate pain (pain score 4-6). 11/19/23   Angiulli, Toribio PARAS, PA-C  lisinopril  (ZESTRIL ) 20 MG  tablet Take 1 tablet (20 mg total) by mouth daily. 12/30/23     melatonin (FT MELATONIN) 3 MG TABS tablet Take 1 tablet (3 mg total) by mouth at bedtime. 12/30/23     metFORMIN  (GLUCOPHAGE ) 1000 MG tablet Take 1 tablet (1,000 mg total) by mouth 2 (two) times daily with meals. 12/30/23     metFORMIN  (GLUCOPHAGE ) 500 MG tablet Take 2 tablets (1,000 mg total) by mouth 2 (two) times daily. 12/24/23     methocarbamol  (ROBAXIN ) 500 MG tablet Take 0.5 tablets (250 mg total) by mouth 4 (four) times daily as needed. 12/23/23     pantoprazole  (PROTONIX ) 40 MG tablet Take 1 tablet (40 mg total) by mouth 2 (two) times daily before  meals. 12/30/23     polyethylene glycol (MIRALAX  / GLYCOLAX ) 17 g packet Take 17 g by mouth daily as needed for mild constipation. 11/16/23   Angiulli, Toribio PARAS, PA-C  Polyethylene Glycol 3350  (PEG 3350 ) 17 GM/SCOOP POWD Take 17 g by mouth daily as needed. 12/30/23     senna-docusate (SENOKOT-S) 8.6-50 MG tablet Take 2 tablets by mouth 2 (two) times daily. 11/16/23   Angiulli, Toribio PARAS, PA-C  Zinc  Sulfate 220 (50 Zn) MG TABS Take 1 tablet (220 mg total) by mouth daily. 11/19/23   Angiulli, Toribio PARAS, PA-C      Allergies    Patient has no known allergies.    Review of Systems   Review of Systems A 10 point review of systems was performed and is negative unless otherwise reported in HPI.  Physical Exam Updated Vital Signs BP (!) 193/82 (BP Location: Left Arm)   Pulse 89   Temp 97.9 F (36.6 C) (Oral)   Resp 16   SpO2 100%  Physical Exam General: Normal appearing {Desc; male/male:11659}, lying in bed.  HEENT: PERRLA, Sclera anicteric, MMM, trachea midline.  Cardiology: RRR, no murmurs/rubs/gallops. BL radial and DP pulses equal bilaterally.  Resp: Normal respiratory rate and effort. CTAB, no wheezes, rhonchi, crackles.  Abd: Soft, non-tender, non-distended. No rebound tenderness or guarding.  GU: Deferred. MSK: No peripheral edema or signs of trauma. Extremities without deformity or TTP. No cyanosis or clubbing. Skin: warm, dry. No rashes or lesions. Back: No CVA tenderness Neuro: A&Ox4, CNs II-XII grossly intact. MAEs. Sensation grossly intact.  Psych: Normal mood and affect.   ED Results / Procedures / Treatments   Labs (all labs ordered are listed, but only abnormal results are displayed) Labs Reviewed  COMPREHENSIVE METABOLIC PANEL WITH GFR  CBC WITH DIFFERENTIAL/PLATELET  URINALYSIS, W/ REFLEX TO CULTURE (INFECTION SUSPECTED)  LACTIC ACID, PLASMA  LACTIC ACID, PLASMA    EKG None  Radiology No results found.  Procedures Procedures  {Document cardiac monitor,  telemetry assessment procedure when appropriate:1}  Medications Ordered in ED Medications - No data to display  ED Course/ Medical Decision Making/ A&P                          Medical Decision Making Amount and/or Complexity of Data Reviewed Labs: ordered. Radiology: ordered.    This patient presents to the ED for concern of ***, this involves an extensive number of treatment options, and is a complaint that carries with it a high risk of complications and morbidity.  I considered the following differential and admission for this acute, potentially life threatening condition.   MDM:    ***     Labs: I Ordered, and personally interpreted labs.  The pertinent  results include:  ***  Imaging Studies ordered: I ordered imaging studies including *** I independently visualized and interpreted imaging. I agree with the radiologist interpretation  Additional history obtained from ***.  External records from outside source obtained and reviewed including ***  Cardiac Monitoring: The patient was maintained on a cardiac monitor.  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   I have reviewed the patients home medicines  and have made adjustments as needed  Problem List / ED Course: Problem List Items Addressed This Visit   None        {Document critical care time when appropriate:1} {Document review of labs and clinical decision tools ie heart score, Chads2Vasc2 etc:1}  {Document your independent review of radiology images, and any outside records:1} {Document your discussion with family members, caretakers, and with consultants:1} {Document social determinants of health affecting pt's care:1} {Document your decision making why or why not admission, treatments were needed:1}  This note was created using dictation software, which may contain spelling or grammatical errors.  "

## 2024-01-22 NOTE — Assessment & Plan Note (Addendum)
 Compression hose Elevate legs  DVT study negative for bilateral DVT 12/2023  C/o of pain in feet. Scab on left great toe, appears to be healing but will check xray

## 2024-01-22 NOTE — Assessment & Plan Note (Signed)
 In NSR Continue eliquis 

## 2024-01-22 NOTE — Assessment & Plan Note (Addendum)
-  Mild, CK of 997 -UA with hgb and mildly elevated AST  -continue IVF overnight, gentle rate  -trend CK

## 2024-01-23 ENCOUNTER — Observation Stay (HOSPITAL_COMMUNITY)

## 2024-01-23 ENCOUNTER — Encounter (HOSPITAL_COMMUNITY): Payer: Self-pay | Admitting: Family Medicine

## 2024-01-23 DIAGNOSIS — M7989 Other specified soft tissue disorders: Secondary | ICD-10-CM | POA: Diagnosis not present

## 2024-01-23 DIAGNOSIS — R7989 Other specified abnormal findings of blood chemistry: Secondary | ICD-10-CM

## 2024-01-23 DIAGNOSIS — I2510 Atherosclerotic heart disease of native coronary artery without angina pectoris: Secondary | ICD-10-CM

## 2024-01-23 LAB — MAGNESIUM: Magnesium: 2.1 mg/dL (ref 1.7–2.4)

## 2024-01-23 LAB — GLUCOSE, CAPILLARY
Glucose-Capillary: 96 mg/dL (ref 70–99)
Glucose-Capillary: 90 mg/dL (ref 70–99)
Glucose-Capillary: 121 mg/dL — ABNORMAL HIGH (ref 70–99)
Glucose-Capillary: 157 mg/dL — ABNORMAL HIGH (ref 70–99)

## 2024-01-23 LAB — COMPREHENSIVE METABOLIC PANEL WITH GFR
ALT: 21 U/L (ref 0–44)
AST: 42 U/L — ABNORMAL HIGH (ref 15–41)
Albumin: 3.1 g/dL — ABNORMAL LOW (ref 3.5–5.0)
Alkaline Phosphatase: 77 U/L (ref 38–126)
Anion gap: 11 (ref 5–15)
BUN: 18 mg/dL (ref 8–23)
CO2: 23 mmol/L (ref 22–32)
Calcium: 9.7 mg/dL (ref 8.9–10.3)
Chloride: 107 mmol/L (ref 98–111)
Creatinine, Ser: 1 mg/dL (ref 0.61–1.24)
GFR, Estimated: >60 mL/min
Glucose, Bld: 116 mg/dL — ABNORMAL HIGH (ref 70–99)
Potassium: 3.7 mmol/L (ref 3.5–5.1)
Sodium: 141 mmol/L (ref 135–145)
Total Bilirubin: 0.9 mg/dL (ref 0.0–1.2)
Total Protein: 5.5 g/dL — ABNORMAL LOW (ref 6.5–8.1)

## 2024-01-23 LAB — ECHOCARDIOGRAM COMPLETE
Area-P 1/2: 4.68 cm2
Calc EF: 58.6 %
Height: 71 in
MV M vel: 5.43 m/s
MV Peak grad: 117.9 mmHg
S' Lateral: 3.1 cm
Single Plane A2C EF: 61 %
Single Plane A4C EF: 54.5 %

## 2024-01-23 LAB — CBC
HCT: 30.2 % — ABNORMAL LOW (ref 39.0–52.0)
Hemoglobin: 10 g/dL — ABNORMAL LOW (ref 13.0–17.0)
MCH: 32.5 pg (ref 26.0–34.0)
MCHC: 33.1 g/dL (ref 30.0–36.0)
MCV: 98.1 fL (ref 80.0–100.0)
Platelets: 316 K/uL (ref 150–400)
RBC: 3.08 MIL/uL — ABNORMAL LOW (ref 4.22–5.81)
RDW: 13.7 % (ref 11.5–15.5)
WBC: 6.2 K/uL (ref 4.0–10.5)
nRBC: 0 % (ref 0.0–0.2)

## 2024-01-23 LAB — VITAMIN D 25 HYDROXY (VIT D DEFICIENCY, FRACTURES)
Vit D, 25-Hydroxy: 31.9 ng/mL (ref 30–100)
Vit D, 25-Hydroxy: 35.1 ng/mL (ref 30–100)

## 2024-01-23 LAB — CK
Total CK: 494 U/L — ABNORMAL HIGH (ref 49–397)
Total CK: 439 U/L — ABNORMAL HIGH (ref 49–397)

## 2024-01-23 LAB — PRO BRAIN NATRIURETIC PEPTIDE: Pro Brain Natriuretic Peptide: 1703 pg/mL — ABNORMAL HIGH

## 2024-01-23 LAB — SEDIMENTATION RATE: Sed Rate: 40 mm/h — ABNORMAL HIGH (ref 0–16)

## 2024-01-23 LAB — PROCALCITONIN: Procalcitonin: 0.17 ng/mL

## 2024-01-23 LAB — AMMONIA: Ammonia: 32 umol/L (ref 9–35)

## 2024-01-23 LAB — VITAMIN B12: Vitamin B-12: 552 pg/mL (ref 180–914)

## 2024-01-23 MED ORDER — APIXABAN 5 MG PO TABS
5.0000 mg | ORAL_TABLET | Freq: Two times a day (BID) | ORAL | Status: DC
  Administered 2024-01-23 – 2024-01-27 (×9): 5 mg via ORAL
  Filled 2024-01-23 (×9): qty 1

## 2024-01-23 MED ORDER — CEFADROXIL 500 MG PO CAPS
1000.0000 mg | ORAL_CAPSULE | Freq: Two times a day (BID) | ORAL | Status: DC
  Administered 2024-01-23 – 2024-01-26 (×7): 1000 mg via ORAL
  Filled 2024-01-23 (×7): qty 2

## 2024-01-23 MED ORDER — HYDROCODONE-ACETAMINOPHEN 5-325 MG PO TABS: 1.0000 | ORAL_TABLET | Freq: Four times a day (QID) | ORAL | Status: DC | PRN

## 2024-01-23 MED ORDER — BRIMONIDINE TARTRATE-TIMOLOL 0.2-0.5 % OP SOLN
1.0000 [drp] | Freq: Two times a day (BID) | OPHTHALMIC | Status: DC
  Filled 2024-01-23: qty 5

## 2024-01-23 MED ORDER — PANTOPRAZOLE SODIUM 40 MG PO TBEC
40.0000 mg | DELAYED_RELEASE_TABLET | Freq: Two times a day (BID) | ORAL | Status: DC
  Administered 2024-01-23 – 2024-01-27 (×8): 40 mg via ORAL
  Filled 2024-01-23 (×9): qty 1

## 2024-01-23 MED ORDER — METFORMIN HCL 500 MG PO TABS
1000.0000 mg | ORAL_TABLET | Freq: Two times a day (BID) | ORAL | Status: DC
  Administered 2024-01-23 – 2024-01-24 (×2): 1000 mg via ORAL
  Filled 2024-01-23 (×2): qty 2

## 2024-01-23 MED ORDER — VITAMIN C 500 MG PO TABS
500.0000 mg | ORAL_TABLET | Freq: Every day | ORAL | Status: DC
  Administered 2024-01-23 – 2024-01-27 (×5): 500 mg via ORAL
  Filled 2024-01-23 (×5): qty 1

## 2024-01-23 MED ORDER — EZETIMIBE 10 MG PO TABS
10.0000 mg | ORAL_TABLET | Freq: Every day | ORAL | Status: DC
  Administered 2024-01-23 – 2024-01-27 (×5): 10 mg via ORAL
  Filled 2024-01-23 (×5): qty 1

## 2024-01-23 MED ORDER — DORZOLAMIDE HCL-TIMOLOL MAL 2-0.5 % OP SOLN
1.0000 [drp] | Freq: Two times a day (BID) | OPHTHALMIC | Status: DC
  Administered 2024-01-23 – 2024-01-27 (×8): 1 [drp] via OPHTHALMIC
  Filled 2024-01-23: qty 10

## 2024-01-23 NOTE — Progress Notes (Signed)
 Inpatient Rehab Admissions Coordinator:   Per therapy recommendations, patient was screened for CIR candidacy by Leita Kleine, MS, CCC-SLP. Pt. Was recently admitted to [facility] and family was not able to provide the supervision recommended at d/c.  He does not appear to have increased support and I suspect he needs LTC placement. I will not    order for rehab consult at this time.  Please contact me any with questions.  Leita Kleine, MS, CCC-SLP Rehab Admissions Coordinator  [phone] (celll) [phone] (office)

## 2024-01-23 NOTE — Progress Notes (Signed)
" °  Echocardiogram 2D Echocardiogram has been performed.  Travis [name] 01/23/2024, 3:52 PM "

## 2024-01-23 NOTE — Evaluation (Signed)
 Physical Therapy Evaluation Patient Details Name: Travis [name] MRN: [id number] DOB: [birth year] Today's Date: 01/23/2024  History of Present Illness  84 y.o. male admitted 01/22/24 after being found down for unknown time by family, hallucinating, covered in feces. Workup for encephalopathy, rhabdomyolysis. PMH includes SSS s/p PPM, CAD, afib on Eliquis , CKD, DM2, OA, glaucoma, hearing loss; R hip fx during 10K race s/p THA 10/2023 with d/c to [facility].   Clinical Impression  Pt presents with an overall decrease in functional mobility secondary to above. PTA, pt independent, lives alone; pt reports that daughters live nearby and visit daily to assist with iADLs as needed. Today, pt's cognition and speech clarity improving once sitting upright at EOB. Pt requiring mod-maxA to stand, modA to take steps with RW. Pt limited by BLE pain/weakness/stiffness, generalized weakness, impaired balance strategies. Pt motivated to participate and regain PLOF. Would benefit from intensive post-acute rehab services (> 3 hrs/day) to maximize functional mobility and independence prior to d/c home.       If plan is discharge home, recommend the following: A lot of help with walking and/or transfers;A lot of help with bathing/dressing/bathroom;Assistance with cooking/housework;Assist for transportation;Help with stairs or ramp for entrance   Can travel by private vehicle   No    Equipment Recommendations  (TBD)  Recommendations for Other Services  Rehab consult;OT consult    Functional Status Assessment Patient has had a recent decline in their functional status and demonstrates the ability to make significant improvements in function in a reasonable and predictable amount of time.     Precautions / Restrictions Precautions Precautions: Fall Recall of Precautions/Restrictions: Impaired Restrictions Weight Bearing Restrictions Per Provider Order: No      Mobility  Bed Mobility Overal bed mobility: Needs  Assistance Bed Mobility: Supine to Sit, Sit to Supine     Supine to sit: Mod assist Sit to supine: Mod assist   General bed mobility comments: increased time and effort with cues for sequencing, heavy modA for BLE management and trunk elevation, use of bed rails; modA for BLE management and trunk repositioning return to supine    Transfers Overall transfer level: Needs assistance Equipment used: Rolling walker (2 wheels) Transfers: Sit to/from Stand Sit to Stand: Max assist, Mod assist, From elevated surface           General transfer comment: 3x sit<>stand from elevated bed to RW, cues for hand placement, initial maxA for trunk elevation and RW management, progressing to heavy modA on subsequent trials with improved movement initiation    Ambulation/Gait Ambulation/Gait assistance: Mod assist   Assistive device: Rolling walker (2 wheels)       Pre-gait activities: on 3rd standing trial, pt performing multiple side steps towards HOB with RW and min-modA for stability and walker management, able to take complete steps with decreased foot clearance; further distance limited by fatigue    Stairs            Wheelchair Mobility     Tilt Bed    Modified Rankin (Stroke Patients Only)       Balance Overall balance assessment: Needs assistance Sitting-balance support: No upper extremity supported, Feet supported Sitting balance-Leahy Scale: Fair     Standing balance support: Bilateral upper extremity supported, Reliant on assistive device for balance   Standing balance comment: reliant on BUE support and external assist  Pertinent Vitals/Pain Pain Assessment Pain Assessment: Faces Faces Pain Scale: Hurts little more Pain Location: BLEs especially R hip Pain Descriptors / Indicators: Discomfort, Sore Pain Intervention(s): Monitored during session, Limited activity within patient's tolerance, Repositioned    Home Living  Family/patient expects to be discharged to:: Private residence Living Arrangements: Alone Available Help at Discharge: Family;Available PRN/intermittently Type of Home: House Home Access: Stairs to enter Entrance Stairs-Rails: Right Entrance Stairs-Number of Steps: 2-3 Alternate Level Stairs-Number of Steps: stays on main level Home Layout: Two level;Able to live on main level with bedroom/bathroom;Full bath on main level Home Equipment: Rolling Walker (2 wheels);Rollator (4 wheels);Cane - single point      Prior Function Prior Level of Function : Independent/Modified Independent             Mobility Comments: typically indep ambulating without DME; reports BLEs getting weaker the past week. typically active, was running before he fell breaking hip at 10K race in 10/2023 ADLs Comments: independent; reports he is usually able to step over tub without issue. reports daughters visit near daily and bring meals he can microwave     Extremity/Trunk Assessment   Upper Extremity Assessment Upper Extremity Assessment: Generalized weakness    Lower Extremity Assessment Lower Extremity Assessment: RLE deficits/detail;LLE deficits/detail RLE Deficits / Details: notable bilateral lower leg swelling, redness and skin changes. gross hip and knee strength </ 3/5 likely exacerbated by painful AROM LLE Deficits / Details: notable bilateral lower leg swelling, redness and skin changes. gross hip and knee strength </ 3/5 likely exacerbated by painful AROM       Communication   Communication Communication: Impaired Factors Affecting Communication: Hearing impaired;Reduced clarity of speech    Cognition Arousal: Alert Behavior During Therapy: WFL for tasks assessed/performed   PT - Cognitive impairments: No family/caregiver present to determine baseline, Sequencing, Initiation, Problem solving                       PT - Cognition Comments: initially slow to answer with mumbled  speech (pt reports speech is baseline); improved alertness and speech clarity once sitting EOB. A&Ox4 but still seems slighly impaired, requiring increased time and cues for sequencing (pt states his mind is telling his body to move but his body is too stiff to move) Following commands: Impaired Following commands impaired: Only follows one step commands consistently, Follows one step commands with increased time     Cueing Cueing Techniques: Verbal cues, Gestural cues     General Comments      Exercises     Assessment/Plan    PT Assessment Patient needs continued PT services  PT Problem List Decreased strength;Decreased range of motion;Decreased activity tolerance;Decreased balance;Decreased mobility;Decreased cognition;Decreased knowledge of use of DME;Pain;Decreased skin integrity       PT Treatment Interventions DME instruction;Gait training;Stair training;Functional mobility training;Therapeutic activities;Therapeutic exercise;Balance training;Cognitive remediation;Patient/family education;Neuromuscular re-education    PT Goals (Current goals can be found in the Care Plan section)  Acute Rehab PT Goals Patient Stated Goal: regain independence, get back to running eventually PT Goal Formulation: With patient Time For Goal Achievement: 02/06/24 Potential to Achieve Goals: Good    Frequency Min 3X/week     Co-evaluation               AM-PAC PT 6 Clicks Mobility  Outcome Measure Help needed turning from your back to your side while in a flat bed without using bedrails?: A Lot Help needed moving from lying on your back to  sitting on the side of a flat bed without using bedrails?: A Lot Help needed moving to and from a bed to a chair (including a wheelchair)?: A Lot Help needed standing up from a chair using your arms (e.g., wheelchair or bedside chair)?: A Lot Help needed to walk in hospital room?: Total Help needed climbing 3-5 steps with a railing? : Total 6 Click  Score: 10    End of Session Equipment Utilized During Treatment: Gait belt Activity Tolerance: Patient tolerated treatment well Patient left: in bed;with call bell/phone within reach;with bed alarm set Nurse Communication: Mobility status;Need for lift equipment (recommend stedy for OOB transfers) PT Visit Diagnosis: Other abnormalities of gait and mobility (R26.89);Muscle weakness (generalized) (M62.81);Pain    Time: 8566-8541 PT Time Calculation (min) (ACUTE ONLY): 25 min   Charges:   PT Evaluation $PT Eval Moderate Complexity: 1 Mod PT Treatments $Therapeutic Activity: 8-22 mins PT General Charges $$ ACUTE PT VISIT: 1 Visit    Darice Almas, PT, DPT Acute Rehabilitation Services  Personal: Secure Chat Rehab Office: [phone]  Darice LITTIE Almas 01/23/2024, 3:28 PM

## 2024-01-23 NOTE — ED Notes (Signed)
[facility] arrived for pt 

## 2024-01-23 NOTE — Progress Notes (Signed)
 Venous duplex lower ext  has been completed. Refer to [facility] under chart review to view preliminary results.   01/23/2024  12:45 PM Tamyka Bezio, Ricka BIRCH

## 2024-01-23 NOTE — ED Notes (Signed)
 Report given to [facility] nurse

## 2024-01-23 NOTE — Hospital Course (Addendum)
 Travis [name] is a 84 y.o. male with PMH of HTN, HLD, SSS s/p PPM, T2DM, PAF on eliquis  who presented to ED for altered mental status after being found sitting on floor, covered in feces for unknown amount of time, and reportedly last seen normal on 01/19/2024. In the ZI:Jqzampoz hypertensive, not hypoxic  labs: AST: 58, CK: 997, troponin 80>59, UA: moderate hgb,20 ketones,100 protein.  CXR: no acute findings. Moderate hiatal  hernia.  CT head: no acute finding.Parenchymal volume loss commensurate with the patient's age. 3. Periventricular white matter changes likely reflecting the sequela of small vessel ischemia. He was given 1 L IV fluids and admitted, In the ED-patient was also hallucinating,thought people were trying to break into the house, but was oriented Of note -he was admitted at [facility] 12/17/23: for weakness/rule out stroke. CT head, CTA head/neck and DVT studies, MRA head MRI  c spine no acute finding, some neuroforaminal stenosis.  Assessment and plan:  Acute encephalopathy Recent admission for AMS/stroke rule out at [facility] 12/5 w/ negative workup Generalized weakness/deconditioning Lower leg edema/mild cellulitis and venous stasis: family found him in a chair at his house sitting in feces and with no clothes, unsure how long, also report of hallucination Workup shows mild rhabdomyolysis otherwise unremarkable B12 TSH and ammonia lactic acid procalcitonin CRP elevated 13.8 unclear etiology-?  Lower leg cellulitis/edema he is able to move extremities but painful and appears edematous with mild erythema.  No evidence of pneumonia or UTI. Pending MRI brain, with PPM in place.Interrogate PPM. Obtain x-ray of pelvis, vitamin d , ordered PT OT evaluation. Add empiric antibiotic for mild cellulitis. Checking duplex of legs,TTE along with BNP. similar admit with no clear etiology for confusion at [facility] in 12/2023  If workup unremarkable consider neurology consult vs. EEG,, has some memory  decline   Mild rhabdomyolysis: Recheck CK.  Caution with IVF Given Edematous Leg   CAD with elevated troponin Troponin flat 80>56 with no significant changes on EKG, no chest pain question demand ischemia Follow-up echocardiogram monitor in telemetry. Hold statin with mild rhabdo   Hypomagnesemia: Resolved   benign essential HTN Med rec pending.appears to be on lisinopril  and hydralazine .  Currently stable without meds.  Resume slowly   Persistent atrial fibrillation: in NSR,Continue eliquis     T2DM W/O omplication, without long-term current use of insulin  A1C of 6.5 in may 2025 . Cont to hold metformin  and cont ssi   SSS S/p PPM    CKD3A: Stable  Pressure injury stage I on the buttock:: continue offloading wound care Wound 01/22/24 1750 Pressure Injury Buttocks Left Stage 1 -  Intact skin with non-blanchable redness of a localized area usually over a bony prominence. (Active)

## 2024-01-23 NOTE — Progress Notes (Signed)
 " PROGRESS NOTE [name]  FMW:[id number] DOB: Aug 14, 1940 DOA: 01/22/2024 PCP: [name], MD  Brief Narrative/Hospital Course: Travis [name] is a 84 y.o. male with PMH of HTN, HLD, SSS s/p PPM, T2DM, PAF on eliquis  who presented to ED for altered mental status after being found sitting on floor, covered in feces for unknown amount of time, and reportedly last seen normal on 01/19/2024. In the ED: Afebrile hypertensive, not hypoxic  labs: AST: 58, CK: 997, troponin 80>59, UA: moderate hgb, 20 ketones, 100 protein,  CXR: no acute findings. Moderate hiatal  hernia.  CT head: no acute finding. Parenchymal volume loss commensurate with the patient's age. 3. Periventricular white matter changes likely reflecting the sequela of small vessel ischemia.  He was given 1 L IV fluids and admitted In the ED patient was also hallucinating,thought people were trying to break into the house, but was oriented Of note -he was admitted at [facility] 12/17/23: for weakness/rule out stroke. CT head, CTA head/neck and DVT studies, MRA head MRI  c spine no acute finding, some neuroforaminal stenosis.  Subjective: Seen and examined this am in ED as he is about to be transferred to [facility] He was still in sleep-started to wake up,able to answer some questions all right upper quadrant quadrant data.  addendum  but goes back to sleep. Patient otherwise denies any nausea, vomiting, chest pain, shortness of breath Voice muffled, dry mouth leg edematous, on RA, has painful movements of legs Overnight afebrile, VSS, Labs stable CMP, blood gas, ammonia lactic acid CRP B12 Pro-Cal  Assessment and plan:  Acute encephalopathy Recent admission for AMS/stroke rule out at [facility] 12/5 w/ negative workup Generalized weakness/deconditioning Lower leg edema/mild cellulitis and venous stasis: family found him in a chair at his house sitting in feces and with no clothes, unsure how long, also report of hallucination Workup shows mild  rhabdomyolysis otherwise unremarkable B12 TSH and ammonia lactic acid procalcitonin CRP elevated 13.8 unclear etiology-?  Lower leg cellulitis/edema he is able to move extremities but painful and appears edematous with mild erythema.  No evidence of pneumonia or UTI. Pending MRI brain, with PPM in place.  Interrogate PPM Obtain x-ray of pelvis, vitamin d , ordered PT OT evaluation Add empiric antibiotic for mild cellulitis, Checking duplex of legs, TTE along with BNP similar admit with no clear etiology for confusion at [facility] in 12/2023  If workup unremarkable consider neurology consult vs. EEG,, has some memory decline   Mild rhabdomyolysis: Recheck CK.  Caution with IVF Given Edematous Leg   CAD with elevated troponin Troponin flat 80>56 with no significant changes on EKG, no chest pain question demand ischemia Follow-up echocardiogram monitor in telemetry. Hold statin with mild rhabdo   Hypomagnesemia: Resolved   benign essential HTN Med rec pending.appears to be on lisinopril  and hydralazine .  Currently stable without meds.  Resume slowly   Persistent atrial fibrillation: in NSR,Continue eliquis     T2DM W/O omplication, without long-term current use of insulin  A1C of 6.5 in may 2025 . Cont to hold metformin  and cont ssi   SSS S/p PPM    CKD3A: Stable  Pressure injury stage I on the buttock:: continue offloading wound care Wound 01/22/24 1750 Pressure Injury Buttocks Left Stage 1 -  Intact skin with non-blanchable redness of a localized area usually over a bony prominence. (Active)    DVT prophylaxis: Place TED hose Start: 01/22/24 2249 Code Status:   Code Status: Full Code Family Communication: plan of care discussed  with patient at bedside. Patient status is: Remains hospitalized because of severity of illness Level of care: Telemetry   Dispo: The patient is from: home            Anticipated disposition: TBD Objective: Vitals last 24 hrs: Vitals:   01/22/24 2159  01/23/24 0150 01/23/24 0400 01/23/24 0604  BP:  (!) 146/78 (!) 141/82 (!) 152/76  Pulse:  84 81 82  Resp:  15 16 15   Temp: 98 F (36.7 C) 97.6 F (36.4 C)  97.7 F (36.5 C)  TempSrc:  Oral  Oral  SpO2:  97% 96% 99%    Physical Examination: General exam: Sleepy able to wake up and interact  HEENT:Oral mucosa DRY, Ear/Nose WNL grossly Respiratory system: Bilaterally clear BS,no use of accessory muscle Cardiovascular system: S1 & S2 +, No JVD. Gastrointestinal system: Abdomen soft,NT,ND, BS+ Nervous System: , moving all extremities but painful leg movement,and following commands. Extremities: extremities warm, leg edema ++ w/ erythema Skin: Warm, no rashes MSK: Normal muscle bulk,tone, power   Medications reviewed:  Scheduled Meds:  apixaban   5 mg Oral BID   ascorbic acid   500 mg Oral Daily   brimonidine -timolol   1 drop Both Eyes BID   cefadroxil   1,000 mg Oral BID   ezetimibe   10 mg Oral Daily   insulin  aspart  0-9 Units Subcutaneous TID WC   pantoprazole   40 mg Oral BID   Continuous Infusions:  lactated ringers  Stopped (01/22/24 2120)   Diet: Diet Order             Diet heart healthy/carb modified Room service appropriate? Yes; Fluid consistency: Thin  Diet effective now                  Data Reviewed: I have personally reviewed following labs and imaging studies ( see epic result tab) CBC: Recent Labs  Lab 01/22/24 1750 01/23/24 0611  WBC 9.8 6.2  NEUTROABS 8.5*  --   HGB 11.5* 10.0*  HCT 34.3* 30.2*  MCV 96.3 98.1  PLT 342 316   CMP: Recent Labs  Lab 01/22/24 1750 01/22/24 2249 01/23/24 0611  NA 139  --  141  K 3.8  --  3.7  CL 103  --  107  CO2 22  --  23  GLUCOSE 178*  --  116*  BUN 24*  --  18  CREATININE 1.10  --  1.00  CALCIUM  10.4*  --  9.7  MG  --  1.5* 2.1  PHOS  --  3.3  --    GFR: CrCl cannot be calculated (Unknown ideal weight.). Recent Labs  Lab 01/22/24 1750 01/23/24 0611  AST 58* 42*  ALT 26 21  ALKPHOS 103 77  BILITOT  1.3* 0.9  PROT 6.8 5.5*  ALBUMIN 3.6 3.1*   No results for input(s): LIPASE, AMYLASE in the last 168 hours.  Recent Labs  Lab 01/23/24 0258  AMMONIA 32   Coagulation Profile: No results for input(s): INR, PROTIME in the last 168 hours. Unresulted Labs (From admission, onward)     Start     Ordered   01/24/24 0500  Comprehensive metabolic panel with GFR  Tomorrow morning,   R        01/23/24 0728   01/24/24 0500  CBC  Tomorrow morning,   R        01/23/24 0728   01/24/24 0500  Magnesium   Tomorrow morning,   R        01/23/24  9271   01/23/24 0814  CK  Once,   R        01/23/24 0814   01/23/24 0813  VITAMIN D  25 Hydroxy (Vit-D Deficiency, Fractures)  Add-on,   AD        01/23/24 0812   01/22/24 2255  Protime-INR  Once,   R        01/22/24 2255   01/22/24 1806  Blood culture (routine x 2)  BLOOD CULTURE X 2,   R      01/22/24 1805   01/22/24 1753  Lactic acid, plasma  (Lactic Acid)  STAT Now then every 3 hours,   R      01/22/24 1752           Antimicrobials/Microbiology: Anti-infectives (From admission, onward)    Start     Dose/Rate Route Frequency Ordered Stop   01/23/24 1000  cefadroxil  (DURICEF) capsule 1,000 mg        1,000 mg Oral 2 times daily 01/23/24 0814 01/28/24 0959         Component Value Date/Time   SDES  01/22/2024 1750    RIGHT ANTECUBITAL BLOOD Performed at [facility], [address] [place], [place] [address]    SPECREQUEST  01/22/2024 1750    BOTTLES DRAWN AEROBIC AND ANAEROBIC Blood Culture adequate volume Performed at [facility], [address], [place], [place] [address]    CULT PENDING 01/22/2024 1750   REPTSTATUS PENDING 01/22/2024 1750    Procedures:    Mennie LAMY, MD [facility] 01/23/2024, 8:17 AM   "

## 2024-01-24 ENCOUNTER — Observation Stay (HOSPITAL_COMMUNITY)

## 2024-01-24 DIAGNOSIS — L89619 Pressure ulcer of right heel, unspecified stage: Secondary | ICD-10-CM | POA: Diagnosis present

## 2024-01-24 DIAGNOSIS — I878 Other specified disorders of veins: Secondary | ICD-10-CM | POA: Diagnosis present

## 2024-01-24 DIAGNOSIS — I2489 Other forms of acute ischemic heart disease: Secondary | ICD-10-CM | POA: Diagnosis present

## 2024-01-24 DIAGNOSIS — E785 Hyperlipidemia, unspecified: Secondary | ICD-10-CM | POA: Diagnosis present

## 2024-01-24 DIAGNOSIS — I4819 Other persistent atrial fibrillation: Secondary | ICD-10-CM | POA: Diagnosis present

## 2024-01-24 DIAGNOSIS — I129 Hypertensive chronic kidney disease with stage 1 through stage 4 chronic kidney disease, or unspecified chronic kidney disease: Secondary | ICD-10-CM | POA: Diagnosis present

## 2024-01-24 DIAGNOSIS — K219 Gastro-esophageal reflux disease without esophagitis: Secondary | ICD-10-CM | POA: Diagnosis present

## 2024-01-24 DIAGNOSIS — Z87891 Personal history of nicotine dependence: Secondary | ICD-10-CM | POA: Diagnosis not present

## 2024-01-24 DIAGNOSIS — E860 Dehydration: Secondary | ICD-10-CM | POA: Diagnosis present

## 2024-01-24 DIAGNOSIS — I495 Sick sinus syndrome: Secondary | ICD-10-CM | POA: Diagnosis present

## 2024-01-24 DIAGNOSIS — Z961 Presence of intraocular lens: Secondary | ICD-10-CM | POA: Diagnosis present

## 2024-01-24 DIAGNOSIS — I2510 Atherosclerotic heart disease of native coronary artery without angina pectoris: Secondary | ICD-10-CM | POA: Diagnosis present

## 2024-01-24 DIAGNOSIS — Z1152 Encounter for screening for COVID-19: Secondary | ICD-10-CM | POA: Diagnosis not present

## 2024-01-24 DIAGNOSIS — G9340 Encephalopathy, unspecified: Secondary | ICD-10-CM | POA: Diagnosis present

## 2024-01-24 DIAGNOSIS — Z7901 Long term (current) use of anticoagulants: Secondary | ICD-10-CM | POA: Diagnosis not present

## 2024-01-24 DIAGNOSIS — N1831 Chronic kidney disease, stage 3a: Secondary | ICD-10-CM | POA: Diagnosis present

## 2024-01-24 DIAGNOSIS — Z950 Presence of cardiac pacemaker: Secondary | ICD-10-CM | POA: Diagnosis not present

## 2024-01-24 DIAGNOSIS — E1140 Type 2 diabetes mellitus with diabetic neuropathy, unspecified: Secondary | ICD-10-CM | POA: Diagnosis present

## 2024-01-24 DIAGNOSIS — E1122 Type 2 diabetes mellitus with diabetic chronic kidney disease: Secondary | ICD-10-CM | POA: Diagnosis present

## 2024-01-24 DIAGNOSIS — M6282 Rhabdomyolysis: Secondary | ICD-10-CM | POA: Diagnosis present

## 2024-01-24 DIAGNOSIS — G9341 Metabolic encephalopathy: Secondary | ICD-10-CM | POA: Diagnosis present

## 2024-01-24 DIAGNOSIS — Z7984 Long term (current) use of oral hypoglycemic drugs: Secondary | ICD-10-CM | POA: Diagnosis not present

## 2024-01-24 DIAGNOSIS — L89629 Pressure ulcer of left heel, unspecified stage: Secondary | ICD-10-CM | POA: Diagnosis present

## 2024-01-24 DIAGNOSIS — Z96641 Presence of right artificial hip joint: Secondary | ICD-10-CM | POA: Diagnosis present

## 2024-01-24 LAB — CUP PACEART REMOTE DEVICE CHECK
Battery Remaining Longevity: 73 mo
Battery Voltage: 2.97 V
Brady Statistic AP VP Percent: 0.38 %
Brady Statistic AP VS Percent: 95.63 %
Brady Statistic AS VP Percent: 0.02 %
Brady Statistic AS VS Percent: 3.97 %
Brady Statistic RA Percent Paced: 95.98 %
Brady Statistic RV Percent Paced: 0.4 %
Date Time Interrogation Session: 20260110234249
Implantable Lead Connection Status: 753985
Implantable Lead Connection Status: 753985
Implantable Lead Implant Date: 20180530
Implantable Lead Implant Date: 20180530
Implantable Lead Location: 753859
Implantable Lead Location: 753860
Implantable Lead Model: 5076
Implantable Lead Model: 5076
Implantable Pulse Generator Implant Date: 20180530
Lead Channel Impedance Value: 285 Ohm
Lead Channel Impedance Value: 323 Ohm
Lead Channel Impedance Value: 380 Ohm
Lead Channel Impedance Value: 437 Ohm
Lead Channel Pacing Threshold Amplitude: 0.625 V
Lead Channel Pacing Threshold Amplitude: 0.75 V
Lead Channel Pacing Threshold Pulse Width: 0.4 ms
Lead Channel Pacing Threshold Pulse Width: 0.4 ms
Lead Channel Sensing Intrinsic Amplitude: 13.125 mV
Lead Channel Sensing Intrinsic Amplitude: 13.125 mV
Lead Channel Sensing Intrinsic Amplitude: 3.75 mV
Lead Channel Sensing Intrinsic Amplitude: 3.75 mV
Lead Channel Setting Pacing Amplitude: 1.5 V
Lead Channel Setting Pacing Amplitude: 2 V
Lead Channel Setting Pacing Pulse Width: 0.4 ms
Lead Channel Setting Sensing Sensitivity: 2 mV
Zone Setting Status: 755011

## 2024-01-24 LAB — COMPREHENSIVE METABOLIC PANEL WITH GFR
ALT: 22 U/L (ref 0–44)
AST: 37 U/L (ref 15–41)
Albumin: 2.6 g/dL — ABNORMAL LOW (ref 3.5–5.0)
Alkaline Phosphatase: 74 U/L (ref 38–126)
Anion gap: 10 (ref 5–15)
BUN: 15 mg/dL (ref 8–23)
CO2: 21 mmol/L — ABNORMAL LOW (ref 22–32)
Calcium: 9 mg/dL (ref 8.9–10.3)
Chloride: 106 mmol/L (ref 98–111)
Creatinine, Ser: 0.78 mg/dL (ref 0.61–1.24)
GFR, Estimated: >60 mL/min
Glucose, Bld: 101 mg/dL — ABNORMAL HIGH (ref 70–99)
Potassium: 4 mmol/L (ref 3.5–5.1)
Sodium: 137 mmol/L (ref 135–145)
Total Bilirubin: 0.5 mg/dL (ref 0.0–1.2)
Total Protein: 5.1 g/dL — ABNORMAL LOW (ref 6.5–8.1)

## 2024-01-24 LAB — CBC
HCT: 29.3 % — ABNORMAL LOW (ref 39.0–52.0)
Hemoglobin: 9.8 g/dL — ABNORMAL LOW (ref 13.0–17.0)
MCH: 32.5 pg (ref 26.0–34.0)
MCHC: 33.4 g/dL (ref 30.0–36.0)
MCV: 97 fL (ref 80.0–100.0)
Platelets: 317 K/uL (ref 150–400)
RBC: 3.02 MIL/uL — ABNORMAL LOW (ref 4.22–5.81)
RDW: 13.5 % (ref 11.5–15.5)
WBC: 4.8 K/uL (ref 4.0–10.5)
nRBC: 0 % (ref 0.0–0.2)

## 2024-01-24 LAB — HIV ANTIBODY (ROUTINE TESTING W REFLEX): HIV Screen 4th Generation wRfx: NONREACTIVE

## 2024-01-24 LAB — HEMOGLOBIN A1C
Hgb A1c MFr Bld: 6.8 % — ABNORMAL HIGH (ref 4.8–5.6)
Mean Plasma Glucose: 148.46 mg/dL

## 2024-01-24 LAB — C-REACTIVE PROTEIN: CRP: 6.2 mg/dL — ABNORMAL HIGH

## 2024-01-24 LAB — GLUCOSE, CAPILLARY
Glucose-Capillary: 98 mg/dL (ref 70–99)
Glucose-Capillary: 117 mg/dL — ABNORMAL HIGH (ref 70–99)
Glucose-Capillary: 173 mg/dL — ABNORMAL HIGH (ref 70–99)

## 2024-01-24 LAB — MAGNESIUM: Magnesium: 1.7 mg/dL (ref 1.7–2.4)

## 2024-01-24 MED ORDER — ORAL CARE MOUTH RINSE: 15.0000 mL | OROMUCOSAL | Status: DC | PRN

## 2024-01-24 NOTE — Progress Notes (Signed)
 OT Cancellation Note  Patient Details Name: Travis [name] MRN: [id number] DOB: 1940/09/09   Cancelled Treatment:    Reason Eval/Treat Not Completed: Patient at procedure or test/ unavailable (MRI) OT will continue to follow for evaluation.   Leita PARAS Eudelia Hiltunen 01/24/2024, 12:01 PM  Leita DEL OTR/L Acute Rehabilitation Services Office: [phone]

## 2024-01-24 NOTE — Evaluation (Signed)
 Speech Language Pathology Evaluation Patient Details Name: Travis [name] MRN: [id number] DOB: [birth year] Today's Date: 01/24/2024 Time: 9160-9145 SLP Time Calculation (min) (ACUTE ONLY): 15 min  Problem List:  Patient Active Problem List   Diagnosis Date Noted   Acute encephalopathy 01/22/2024   Rhabdomyolysis 01/22/2024   Venous stasis 01/22/2024   Benign essential HTN 01/22/2024   Type 2 diabetes mellitus without complication, without long-term current use of insulin  (HCC) 12/18/2023   Cognitive changes 11/16/2023   Malnutrition of moderate degree 11/12/2023   H/O total hip arthroplasty, right 11/09/2023   Fracture of femoral neck, right (HCC) 11/09/2023   Debility 11/09/2023   S/P total right hip arthroplasty 10/31/2023   Other fracture of right femur, initial encounter for closed fracture (HCC) 10/30/2023   Persistent atrial fibrillation (HCC) 10/01/2023   Chronic kidney disease, stage 3a (HCC) 07/06/2023   Elevated PSA 07/06/2023   Swelling of right hand 07/06/2023   Urticaria 07/06/2023   Closed fracture of nasal bones 12/27/2020   Impacted cerumen of left ear 12/27/2020   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   Proteinuria 10/13/2018   Encounter for general adult medical examination without abnormal findings 10/06/2018   Glaucoma 04/12/2018   Hearing loss 04/12/2018   History of urinary stone 04/12/2018   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   Osteoarthritis 04/12/2018   Pure hypercholesterolemia 04/12/2018   Left patella fracture 12/16/2017   CAD with elevated troponin 09/14/2016   Pacemaker 06/10/2016   SSS (sick sinus syndrome) (HCC) 06/07/2016   Paroxysmal atrial fibrillation (HCC) 06/07/2016   Sinus pause 06/07/2016   Bradycardia 06/04/2016   Groin pain 06/29/2014   Piriformis syndrome of left side 10/30/2013   Past Medical History:  Past Medical History:  Diagnosis Date   Heart murmur    an innocent one (06/10/2016)   History of blood  transfusion ~ 1956   while I was in hospital; don't remember why   History of kidney stones    Hyperlipidemia    Hypertension    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   SSS (sick sinus syndrome) (HCC) 06/10/2016   Type II diabetes mellitus (HCC)    Past Surgical History:  Past Surgical History:  Procedure Laterality Date   APPENDECTOMY     CARDIAC CATHETERIZATION  06/10/2016   CARDIOVERSION N/A 10/01/2023   Procedure: CARDIOVERSION;  Surgeon: Francyne Headland, MD;  Location: [facility] INVASIVE CV LAB;  Service: Cardiovascular;  Laterality: N/A;   CATARACT EXTRACTION W/ INTRAOCULAR LENS IMPLANT Right    CYSTOSCOPY W/ STONE MANIPULATION     INSERT / REPLACE / REMOVE PACEMAKER  06/10/2016   dual chamber permanent pacemaker   KNEE ARTHROSCOPY W/ MENISCECTOMY Bilateral    LAPAROSCOPIC CHOLECYSTECTOMY     LEFT HEART CATH AND CORONARY ANGIOGRAPHY N/A 06/10/2016   Procedure: Left Heart Cath and Coronary Angiography;  Surgeon: Cherrie Toribio SAUNDERS, MD;  Location: [facility] INVASIVE CV LAB;  Service: Cardiovascular;  Laterality: N/A;   LITHOTRIPSY     PACEMAKER IMPLANT N/A 06/10/2016   Procedure: Pacemaker Implant;  Surgeon: Francyne Headland, MD;  Location: [facility];  Service: Cardiovascular;  Laterality: N/A;   TONSILLECTOMY     TOTAL HIP ARTHROPLASTY Right 10/31/2023   Procedure: RIGHT TOTAL HIP ARTHROPLASTY,  ANTERIOR APPROACH;  Surgeon: Ernie Cough, MD;  Location: [facility] OR;  Service: Orthopedics;  Laterality: Right;   HPI:  Travis [name] is a 84 y.o. male with medical history significant of HTN, HLD, SSS s/p PPM, T2DM, PAF on  eliquis  who presented to ED for AMS and hallucinations after being found sitting on floor, covered in feces for unknown amount of time. Last seen normal on 01/19/24. He was admitted at [facility] 12/5 for weakness/rule out stroke. CT head, CTA head/neck and DVT studies wnl. MRI Pending.   Assessment / Plan / Recommendation Clinical Impression  Patient presents  with cognitive linguistic function that is likely near baseline based on patient reports, help received prior to admisison, and results gleaned from structured and unstructured testing. Patient endorses that at baseline, daughters assist with all iADL tasks including medication management, cooking, cleaning, and armed forces training and education officer. SLP administered various items from SLUMS examination where patient demonstrated reduced immediate and short term recall as well as reduced problem solving and processing speed. Patient was aware of deficits and also demonstrate accurate historical recall of detailed events leading up to hospitalization. Receptive/expressive language and motor speech appear to be [facility]. No further SLP services indicated in the acute venue at this time.    SLP Assessment  SLP Recommendation/Assessment: Patient does not need any further Speech Language Pathology Services SLP Visit Diagnosis: Cognitive communication deficit (R41.841)     Assistance Recommended at Discharge  PRN  Functional Status Assessment Patient has not had a recent decline in their functional status  Frequency and Duration           SLP Evaluation Cognition  Overall Cognitive Status: No family/caregiver present to determine baseline cognitive functioning Arousal/Alertness: Awake/alert Orientation Level: Oriented X4 Year: 2026 Month: January Day of Week: Correct Attention: Sustained Sustained Attention: Appears intact Memory: Impaired Memory Impairment: Retrieval deficit;Storage deficit Awareness: Appears intact Problem Solving: Impaired Problem Solving Impairment: Verbal complex;Functional complex Safety/Judgment: Appears intact       Comprehension  Auditory Comprehension Overall Auditory Comprehension: Appears within functional limits for tasks assessed Visual Recognition/Discrimination Discrimination: Within Function Limits    Expression Expression Primary Mode of Expression: Verbal Verbal  Expression Overall Verbal Expression: Appears within functional limits for tasks assessed Initiation: No impairment   Oral / Motor  Oral Motor/Sensory Function Overall Oral Motor/Sensory Function: Within functional limits Motor Speech Overall Motor Speech: Appears within functional limits for tasks assessed Intelligibility: Intelligible            Rosina DELENA Downy 01/24/2024, 11:39 AM

## 2024-01-24 NOTE — NC FL2 (Cosign Needed)
 " [place]  MEDICAID FL2 LEVEL OF CARE FORM     IDENTIFICATION  Patient Name: Travis [name] Birthdate: November 22, 1940 Sex: male Admission Date (Current Location): 01/22/2024  [facility] and [place] Number:  Producer, Television/film/video and Address:  [facility], [address], [place], [place] [address]      Provider Number: [id number]  Attending Physician Name and Address:  Perri DELENA Meliton Mickey., *  Relative Name and Phone Number:  Remo Kirschenmann, daughter - [phone]    Current Level of Care: Hospital Recommended Level of Care: Skilled Nursing Facility Prior Approval Number:    Date Approved/Denied:   PASRR Number: [id number]  Discharge Plan: SNF    Current Diagnoses: Patient Active Problem List   Diagnosis Date Noted   Acute encephalopathy 01/22/2024   Rhabdomyolysis 01/22/2024   Venous stasis 01/22/2024   Benign essential HTN 01/22/2024   Type 2 diabetes mellitus without complication, without long-term current use of insulin  (HCC) 12/18/2023   Cognitive changes 11/16/2023   Malnutrition of moderate degree 11/12/2023   H/O total hip arthroplasty, right 11/09/2023   Fracture of femoral neck, right (HCC) 11/09/2023   Debility 11/09/2023   S/P total right hip arthroplasty 10/31/2023   Other fracture of right femur, initial encounter for closed fracture (HCC) 10/30/2023   Persistent atrial fibrillation (HCC) 10/01/2023   Chronic kidney disease, stage 3a (HCC) 07/06/2023   Elevated PSA 07/06/2023   Swelling of right hand 07/06/2023   Urticaria 07/06/2023   Closed fracture of nasal bones 12/27/2020   Impacted cerumen of left ear 12/27/2020   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   Proteinuria 10/13/2018   Encounter for general adult medical examination without abnormal findings 10/06/2018   Glaucoma 04/12/2018   Hearing loss 04/12/2018   History of urinary stone 04/12/2018   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   Osteoarthritis  04/12/2018   Pure hypercholesterolemia 04/12/2018   Left patella fracture 12/16/2017   CAD with elevated troponin 09/14/2016   Pacemaker 06/10/2016   SSS (sick sinus syndrome) (HCC) 06/07/2016   Paroxysmal atrial fibrillation (HCC) 06/07/2016   Sinus pause 06/07/2016   Bradycardia 06/04/2016   Groin pain 06/29/2014   Piriformis syndrome of left side 10/30/2013    Orientation RESPIRATION BLADDER Height & Weight     Self, Time, Situation, Place  Normal External catheter Weight: 69.4 kg Height:  5' 11 (180.3 cm)  BEHAVIORAL SYMPTOMS/MOOD NEUROLOGICAL BOWEL NUTRITION STATUS      Continent Diet (See discharge summary)  AMBULATORY STATUS COMMUNICATION OF NEEDS Skin   Extensive Assist   Skin abrasions                       Personal Care Assistance Level of Assistance  Bathing, Feeding, Dressing Bathing Assistance: Limited assistance Feeding assistance: Independent Dressing Assistance: Limited assistance     Functional Limitations Info  Sight, Hearing, Speech Sight Info: Adequate Hearing Info: Adequate Speech Info: Adequate    SPECIAL CARE FACTORS FREQUENCY  PT (By licensed PT), OT (By licensed OT)     PT Frequency: 5 x per week OT Frequency: 5 x per week            Contractures Contractures Info: Not present    Additional Factors Info  Code Status, Allergies, Insulin  Sliding Scale Code Status Info: Full Code Allergies Info: NKDA   Insulin  Sliding Scale Info: Novolog  Insulin  - sensitive sliding scale       Current Medications (01/24/2024):  This  is the current hospital active medication list Current Facility-Administered Medications  Medication Dose Route Frequency Provider Last Rate Last Admin   acetaminophen  (TYLENOL ) tablet 650 mg  650 mg Oral Q6H PRN Waddell Rake, MD   650 mg at 01/22/24 2333   Or   acetaminophen  (TYLENOL ) suppository 650 mg  650 mg Rectal Q6H PRN Waddell Rake, MD       apixaban  (ELIQUIS ) tablet 5 mg  5 mg Oral BID Kc, Ramesh, MD    5 mg at 01/24/24 0819   ascorbic acid  (VITAMIN C ) tablet 500 mg  500 mg Oral Daily Kc, Mennie, MD   500 mg at 01/24/24 0819   cefadroxil  (DURICEF) capsule 1,000 mg  1,000 mg Oral BID Kc, Mennie, MD   1,000 mg at 01/24/24 0819   dorzolamide -timolol  (COSOPT ) 2-0.5 % ophthalmic solution 1 drop  1 drop Both Eyes BID Perri DELENA Meliton Mickey., MD   1 drop at 01/24/24 9180   ezetimibe  (ZETIA ) tablet 10 mg  10 mg Oral Daily Kc, Mennie, MD   10 mg at 01/24/24 9180   insulin  aspart (novoLOG ) injection 0-9 Units  0-9 Units Subcutaneous TID WC Waddell Rake, MD       lactated ringers  bolus 1,000 mL  1,000 mL Intravenous Once Franklyn Sid SAILOR, MD   Held at 01/22/24 2120   ondansetron  (ZOFRAN ) tablet 4 mg  4 mg Oral Q6H PRN Waddell Rake, MD       Or   ondansetron  (ZOFRAN ) injection 4 mg  4 mg Intravenous Q6H PRN Waddell Rake, MD       Oral care mouth rinse  15 mL Mouth Rinse PRN Perri DELENA Meliton Mickey., MD       pantoprazole  (PROTONIX ) EC tablet 40 mg  40 mg Oral BID Christobal Mennie, MD   40 mg at 01/24/24 9180     Discharge Medications: Please see discharge summary for a list of discharge medications.  Relevant Imaging Results:  Relevant Lab Results:   Additional Information SS# [id number]  Rosaline JONELLE Joe, RN     "

## 2024-01-24 NOTE — Progress Notes (Signed)
 Transition of Care [facility]) - Inpatient Brief Assessment   Patient Details  Name: Travis [name] MRN: [id number] Date of Birth: 12-30-1940  Transition of Care [facility]) CM/SW Contact:    Rosaline JONELLE Joe, RN Phone Number: 01/24/2024, 4:02 PM   Clinical Narrative: Patient admitted from home with acute encephalopathy.  Patient was living at home alone.  Patient was declined by [facility] at this time.  Patient was agreeable to SNF placement.  Patient also states that his daughter would like help with HCPOA - Chaplain consult placed for HCPOA paperwork to be completed.  Patient has RW, Rolator and SPC at home.  Patient was faxed out in the hub for bed offers.  MSW is aware and will follow up with the patient and daughter for bed offers.   Transition of Care Asessment: Insurance and Status: (P) Insurance coverage has been reviewed Patient has primary care physician: (P) Yes Home environment has been reviewed: (P) from home alone Prior level of function:: (P) self Prior/Current Home Services: (P) No current home services Social Drivers of Health Review: (P) SDOH reviewed needs interventions Readmission risk has been reviewed: (P) Yes Transition of care needs: (P) transition of care needs identified, TOC will continue to follow

## 2024-01-24 NOTE — Plan of Care (Signed)
" °  Problem: Education: Goal: Ability to describe self-care measures that may prevent or decrease complications (Diabetes Survival Skills Education) will improve Outcome: Progressing   Problem: Health Behavior/Discharge Planning: Goal: Ability to identify and utilize available resources and services will improve Outcome: Progressing   Problem: Metabolic: Goal: Ability to maintain appropriate glucose levels will improve Outcome: Progressing   Problem: Nutritional: Goal: Maintenance of adequate nutrition will improve Outcome: Progressing   Problem: Tissue Perfusion: Goal: Adequacy of tissue perfusion will improve Outcome: Progressing   Problem: Education: Goal: Knowledge of General Education information will improve Description: Including pain rating scale, medication(s)/side effects and non-pharmacologic comfort measures Outcome: Progressing   Problem: Clinical Measurements: Goal: Ability to maintain clinical measurements within normal limits will improve Outcome: Progressing   "

## 2024-01-24 NOTE — Progress Notes (Addendum)
 " PROGRESS NOTE    Travis [name]  FMW:[id number] DOB: 08/27/1940 DOA: 01/22/2024 PCP: [name], MD  Chief Complaint  Patient presents with   Altered Mental Status    Brief Narrative:   Travis [name] is Travis [name] 84 y.o. male with medical history significant of HTN, HLD, SSS s/p PPM, T2DM, PAF on eliquis  who presented to ED for AMS after being found sitting on floor, covered in feces for unknown amount of time.   He was admitted for AMS.  Assessment & Plan:   Principal Problem:   Acute encephalopathy Active Problems:   Rhabdomyolysis   CAD with elevated troponin   Persistent atrial fibrillation (HCC)   Benign essential HTN   Type 2 diabetes mellitus without complication, without long-term current use of insulin  (HCC)   Venous stasis   SSS (sick sinus syndrome) (HCC)   Chronic kidney disease, stage 3a (HCC)  Acute Metabolic Encephalopathy Found at home sitting in feces, naked.  Hallucinating.  Has declined since 10/2023 fall with R total hip replacement.  For years, his daughter has noticed that he's moving more slowly.  Shuffling gait.  1-2 hrs to get ready.  Everything taking Travis [name] lot longer.   Suspect underlying cognitive impairment, dementia (parkinson's, lewy body?) Currently Travis [name]&O, but question underlying chronic cognitive deficit (dementia?) - will need more hx from family MRI brain pending Head CT without acute abnormality CXR without acute cardiopulm findings UA not c/w UTI  Blood culture pending CRP elevated, unclear cause B12 wnl, ammonia wnl, VBG without hypercarbia, TSH wnl B1, RPR, HIV pending Recent admission 12/24/2023 for encephalopathy - at that time his opiate medicines were discontinued. Discussed with daughter, concern for dementia not generally an indication for inpatient neurology consult.  Typically is Travis [name] outpatient neurology workup.  Holding off for now, plan for outpatient referral.    Elevated Inflammatory Marker CRP elevated, being treated for  possible cellulitis L toe wound, plain film without acute findings Trend CRP   Bilateral LE Edema LE US  without DVT Echo with preserved EF.  Albumin 2.5.   UA with 100 mg/dl protein - follow UP/C Being treated empirically for cellulitis, will plan for short course - 5 days duricef  Toe Wound Palpable DP pulses bilaterally No acute findings to L great toe on plain films  CAD Elevated Troponin Suspected demand ischemia No RWMA on echo  Hypertension BP in 160's today Doesn't seem to be taking any meds  Atrial Fibrillation SSS s/p PPM Eliquis  Pacemaker interrogation pending  Peripheral Neuropathy  B12 wnl TSH, A1c  Sounds like he first noticed this after his fall?  Right Hip Replacement Deconditioning Living independently as of 2 weeks ago  In 2025 Has declined since his surgery Continue therapy  GERD PPI  T2DM Hold metformin  SSI for now  Elevated CK mild    DVT prophylaxis: eliquis  Code Status: full Family Communication: daughter 1/12 over phone Disposition:   Status is: Inpatient Remains inpatient appropriate because: need for continued inpatient carae   Consultants:  none  Procedures:    Antimicrobials:  Anti-infectives (From admission, onward)    Start     Dose/Rate Route Frequency Ordered Stop   01/23/24 1000  cefadroxil  (DURICEF) capsule 1,000 mg        1,000 mg Oral 2 times daily 01/23/24 0814 01/28/24 0959       Subjective: No new complaints Notes confusion prior to hospitalization - increasing in frequency  Objective: Vitals:   01/23/24 2015 01/24/24 0031 01/24/24 0356  01/24/24 0736  BP: (!) 168/76 (!) 167/77 (!) 149/76 (!) 162/71  Pulse: 70 68 64 67  Resp: 20 20 20    Temp: 97.8 F (36.6 C) 97.6 F (36.4 C) (!) 97.5 F (36.4 C) (!) 97.5 F (36.4 C)  TempSrc:      SpO2: 100% 100% 99% 100%  Weight:      Height:        Intake/Output Summary (Last 24 hours) at 01/24/2024 1406 Last data filed at 01/23/2024 2300 Gross per  24 hour  Intake 476 ml  Output 400 ml  Net 76 ml   Filed Weights   01/23/24 0900  Weight: 69.4 kg    Examination:  General exam: Appears calm and comfortable  Respiratory system: Clear to auscultation. Respiratory effort normal. Cardiovascular system: RRR Gastrointestinal system: Abdomen is nondistended, soft and nontender.  Central nervous system: Alert and oriented. CN 2-12 intact.  5/5 strength.  Extremities: bilateral LE edema    Data Reviewed: I have personally reviewed following labs and imaging studies  CBC: Recent Labs  Lab 01/22/24 1750 01/23/24 0611 01/24/24 0432  WBC 9.8 6.2 4.8  NEUTROABS 8.5*  --   --   HGB 11.5* 10.0* 9.8*  HCT 34.3* 30.2* 29.3*  MCV 96.3 98.1 97.0  PLT 342 316 317    Basic Metabolic Panel: Recent Labs  Lab 01/22/24 1750 01/22/24 2249 01/23/24 0611 01/24/24 0432  NA 139  --  141 137  K 3.8  --  3.7 4.0  CL 103  --  107 106  CO2 22  --  23 21*  GLUCOSE 178*  --  116* 101*  BUN 24*  --  18 15  CREATININE 1.10  --  1.00 0.78  CALCIUM  10.4*  --  9.7 9.0  MG  --  1.5* 2.1 1.7  PHOS  --  3.3  --   --     GFR: Estimated Creatinine Clearance: 67.5 mL/min (by C-G formula based on SCr of 0.78 mg/dL).  Liver Function Tests: Recent Labs  Lab 01/22/24 1750 01/23/24 0611 01/24/24 0432  AST 58* 42* 37  ALT 26 21 22   ALKPHOS 103 77 74  BILITOT 1.3* 0.9 0.5  PROT 6.8 5.5* 5.1*  ALBUMIN 3.6 3.1* 2.6*    CBG: Recent Labs  Lab 01/23/24 0840 01/23/24 1144 01/23/24 1621 01/23/24 2016 01/24/24 0735  GLUCAP 96 90 121* 157* 98     Recent Results (from the past 240 hours)  Blood culture (routine x 2)     Status: None (Preliminary result)   Collection Time: 01/22/24  5:50 PM   Specimen: Right Antecubital; Blood  Result Value Ref Range Status   Specimen Description   Final    RIGHT ANTECUBITAL BLOOD Performed at [facility], [address] [place], [place] [address]    Special Requests   Final    BOTTLES DRAWN  AEROBIC AND ANAEROBIC Blood Culture adequate volume Performed at [facility], [address] [place], [place] [address]    Culture   Final    NO GROWTH 2 DAYS Performed at [facility], [address] [place], [place] [address]    Report Status PENDING  Incomplete  Resp panel by RT-PCR (RSV, Flu Niki Cosman&B, Covid) Anterior Nasal Swab     Status: None   Collection Time: 01/22/24  7:02 PM   Specimen: Anterior Nasal Swab  Result Value Ref Range Status   SARS Coronavirus 2 by RT PCR NEGATIVE NEGATIVE  Final    Comment: (NOTE) SARS-CoV-2 target nucleic acids are NOT DETECTED.  The SARS-CoV-2 RNA is generally detectable in upper respiratory specimens during the acute phase of infection. The lowest concentration of SARS-CoV-2 viral copies this assay can detect is 138 copies/mL. Graciana Sessa negative result does not preclude SARS-Cov-2 infection and should not be used as the sole basis for treatment or other patient management decisions. Chauntel Windsor negative result may occur with  improper specimen collection/handling, submission of specimen other than nasopharyngeal swab, presence of viral mutation(s) within the areas targeted by this assay, and inadequate number of viral copies(<138 copies/mL). Mahlani Berninger negative result must be combined with clinical observations, patient history, and epidemiological information. The expected result is Negative.  Fact Sheet for Patients:  bloggercourse.com  Fact Sheet for Healthcare Providers:  seriousbroker.it  This test is no t yet approved or cleared by the [place]  FDA and  has been authorized for detection and/or diagnosis of SARS-CoV-2 by FDA under an Emergency Use Authorization (EUA). This EUA will remain  in effect (meaning this test can be used) for the duration of the COVID-19 declaration under Section 564(b)(1) of the Act, 21 U.S.C.section 360bbb-3(b)(1), unless the authorization is terminated   or revoked sooner.       Influenza Overton Boggus by PCR NEGATIVE NEGATIVE Final   Influenza B by PCR NEGATIVE NEGATIVE Final    Comment: (NOTE) The Xpert Xpress SARS-CoV-2/FLU/RSV plus assay is intended as an aid in the diagnosis of influenza from Nasopharyngeal swab specimens and should not be used as Makalynn Berwanger sole basis for treatment. Nasal washings and aspirates are unacceptable for Xpert Xpress SARS-CoV-2/FLU/RSV testing.  Fact Sheet for Patients: bloggercourse.com  Fact Sheet for Healthcare Providers: seriousbroker.it  This test is not yet approved or cleared by the [place]  FDA and has been authorized for detection and/or diagnosis of SARS-CoV-2 by FDA under an Emergency Use Authorization (EUA). This EUA will remain in effect (meaning this test can be used) for the duration of the COVID-19 declaration under Section 564(b)(1) of the Act, 21 U.S.C. section 360bbb-3(b)(1), unless the authorization is terminated or revoked.     Resp Syncytial Virus by PCR NEGATIVE NEGATIVE Final    Comment: (NOTE) Fact Sheet for Patients: bloggercourse.com  Fact Sheet for Healthcare Providers: seriousbroker.it  This test is not yet approved or cleared by the [place]  FDA and has been authorized for detection and/or diagnosis of SARS-CoV-2 by FDA under an Emergency Use Authorization (EUA). This EUA will remain in effect (meaning this test can be used) for the duration of the COVID-19 declaration under Section 564(b)(1) of the Act, 21 U.S.C. section 360bbb-3(b)(1), unless the authorization is terminated or revoked.  Performed at [facility], [address], [place], [place] [address]   Blood culture (routine x 2)     Status: None (Preliminary result)   Collection Time: 01/23/24 10:27 AM   Specimen: BLOOD LEFT ARM  Result Value Ref Range Status   Specimen Description BLOOD  LEFT ARM  Final   Special Requests   Final    BOTTLES DRAWN AEROBIC AND ANAEROBIC Blood Culture adequate volume   Culture   Final    NO GROWTH < 24 HOURS Performed at [facility], [address] [place], [place] [address]    Report Status PENDING  Incomplete         Radiology Studies: VAS US  LOWER EXTREMITY VENOUS (DVT) Result Date: 01/24/2024  Lower Venous DVT Study Patient Name:  Travis [name] [facility]  Date of Exam:   01/23/2024 Medical Rec #: [id number]      Accession #:    [id number] Date of Birth: 1940/09/27      Patient Gender: M Patient Age:   36 years Exam Location:  [facility] Procedure:      VAS US  LOWER EXTREMITY VENOUS (DVT) Referring Phys: RAMESH KC --------------------------------------------------------------------------------  Indications: Swelling, and Edema.  Anticoagulation: Eliquis . Comparison Study: 10/21/23 - Negative Performing Technologist: Ricka Sturdivant-Jones RDMS, RVT  Examination Guidelines: Aoife Bold complete evaluation includes B-mode imaging, spectral Doppler, color Doppler, and power Doppler as needed of all accessible portions of each vessel. Bilateral testing is considered an integral part of Brandin Stetzer complete examination. Limited examinations for reoccurring indications may be performed as noted. The reflux portion of the exam is performed with the patient in reverse Trendelenburg.  +---------+---------------+---------+-----------+----------+--------------+ RIGHT    CompressibilityPhasicitySpontaneityPropertiesThrombus Aging +---------+---------------+---------+-----------+----------+--------------+ CFV      Full           Yes      Yes                                 +---------+---------------+---------+-----------+----------+--------------+ SFJ      Full                                                        +---------+---------------+---------+-----------+----------+--------------+ FV Prox  Full                                                         +---------+---------------+---------+-----------+----------+--------------+ FV Mid   Full           Yes      Yes                                 +---------+---------------+---------+-----------+----------+--------------+ FV DistalFull                                                        +---------+---------------+---------+-----------+----------+--------------+ PFV      Full                                                        +---------+---------------+---------+-----------+----------+--------------+ POP      Full           Yes      Yes                                 +---------+---------------+---------+-----------+----------+--------------+ PTV      Full                                                        +---------+---------------+---------+-----------+----------+--------------+  PERO     Full                                                        +---------+---------------+---------+-----------+----------+--------------+   +---------+---------------+---------+-----------+----------+--------------+ LEFT     CompressibilityPhasicitySpontaneityPropertiesThrombus Aging +---------+---------------+---------+-----------+----------+--------------+ CFV      Full           Yes      Yes                                 +---------+---------------+---------+-----------+----------+--------------+ SFJ      Full                                                        +---------+---------------+---------+-----------+----------+--------------+ FV Prox  Full                                                        +---------+---------------+---------+-----------+----------+--------------+ FV Mid   Full           Yes      Yes                                 +---------+---------------+---------+-----------+----------+--------------+ FV DistalFull                                                         +---------+---------------+---------+-----------+----------+--------------+ PFV      Full                                                        +---------+---------------+---------+-----------+----------+--------------+ POP      Full           Yes      Yes                                 +---------+---------------+---------+-----------+----------+--------------+ PTV      Full                                                        +---------+---------------+---------+-----------+----------+--------------+ PERO     Full                                                        +---------+---------------+---------+-----------+----------+--------------+  Summary: BILATERAL: - No evidence of deep vein thrombosis seen in the lower extremities, bilaterally. -No evidence of popliteal cyst, bilaterally.   *See table(s) above for measurements and observations. Electronically signed by Gaile New MD on 01/24/2024 at 8:34:19 AM.    Final    ECHOCARDIOGRAM COMPLETE Result Date: 01/23/2024    ECHOCARDIOGRAM REPORT   Patient Name:   Travis [name] Date of Exam: 01/23/2024 Medical Rec #:  [id number]     Height:       71.0 in Accession #:    [id number]    Weight:       150.0 lb Date of Birth:  04/04/1940     BSA:          1.866 m Patient Age:    83 years      BP:           154/81 mmHg Patient Gender: M             HR:           73 bpm. Exam Location:  Inpatient Procedure: 2D Echo (Both Spectral and Color Flow Doppler were utilized during            procedure). Indications:    elevated troponin  History:        Patient has prior history of Echocardiogram examinations, most                 recent 12/26/2021. CAD, Pacemaker, chronic kidney disease,                 Arrythmias:Paroxysmal Lakeria Starkman-fib; Risk Factors:Hypertension and                 Diabetes.  Sonographer:    Tinnie Barefoot RDCS Referring Phys: [id number] ALLISON WOLFE IMPRESSIONS  1. Left ventricular ejection fraction, by estimation, is 55 to 60%.  Left ventricular ejection fraction by 2D MOD biplane is 58.6 %. The left ventricle has normal function. The left ventricle has no regional wall motion abnormalities. Left ventricular diastolic parameters were normal.  2. Lead in RV. Right ventricular systolic function is normal. The right ventricular size is normal. There is normal pulmonary artery systolic pressure.  3. Left atrial size was mildly dilated.  4. Lead in RA.  5. The mitral valve is normal in structure. Mild mitral valve regurgitation. No evidence of mitral stenosis.  6. The aortic valve is normal in structure. Aortic valve regurgitation is not visualized. No aortic stenosis is present.  7. The inferior vena cava is normal in size with <50% respiratory variability, suggesting right atrial pressure of 8 mmHg. Comparison(s): No significant change from prior study. FINDINGS  Left Ventricle: Left ventricular ejection fraction, by estimation, is 55 to 60%. Left ventricular ejection fraction by 2D MOD biplane is 58.6 %. The left ventricle has normal function. The left ventricle has no regional wall motion abnormalities. The left ventricular internal cavity size was normal in size. There is no left ventricular hypertrophy. Left ventricular diastolic parameters were normal. Right Ventricle: Lead in RV. The right ventricular size is normal. No increase in right ventricular wall thickness. Right ventricular systolic function is normal. There is normal pulmonary artery systolic pressure. The tricuspid regurgitant velocity is 2.65 m/s, and with an assumed right atrial pressure of 3 mmHg, the estimated right ventricular systolic pressure is 31.1 mmHg. Left Atrium: Left atrial size was mildly dilated. Right Atrium: Lead in RA. Right atrial size was normal in size. Pericardium: There is no evidence of  pericardial effusion. Mitral Valve: The mitral valve is normal in structure. Mild mitral valve regurgitation. No evidence of mitral valve stenosis. Tricuspid Valve: The  tricuspid valve is normal in structure. Tricuspid valve regurgitation is mild . No evidence of tricuspid stenosis. Aortic Valve: The aortic valve is normal in structure. Aortic valve regurgitation is not visualized. No aortic stenosis is present. Pulmonic Valve: The pulmonic valve was normal in structure. Pulmonic valve regurgitation is not visualized. No evidence of pulmonic stenosis. Aorta: The aortic root is normal in size and structure. Venous: The inferior vena cava is normal in size with less than 50% respiratory variability, suggesting right atrial pressure of 8 mmHg. IAS/Shunts: No atrial level shunt detected by color flow Doppler.  LEFT VENTRICLE PLAX 2D                        Biplane EF (MOD) LVIDd:         4.40 cm         LV Biplane EF:   Left LVIDs:         3.10 cm                          ventricular LV PW:         1.00 cm                          ejection LV IVS:        1.00 cm                          fraction by LVOT diam:     1.90 cm                          2D MOD LV SV:         44                               biplane is LV SV Index:   23                               58.6 %. LVOT Area:     2.84 cm LV IVRT:       130 msec        Diastology                                LV e' medial:    6.53 cm/s                                LV E/e' medial:  12.2 LV Volumes (MOD)               LV e' lateral:   7.18 cm/s LV vol d, MOD    56.9 ml       LV E/e' lateral: 11.1 A2C: LV vol d, MOD    75.2 ml A4C: LV vol s, MOD    22.2 ml A2C: LV vol s, MOD    34.2 ml A4C: LV SV MOD A2C:   34.7 ml LV SV MOD A4C:   75.2 ml  LV SV MOD BP:    38.8 ml RIGHT VENTRICLE             IVC RV Basal diam:  2.80 cm     IVC diam: 1.60 cm RV S prime:     17.60 cm/s TAPSE (M-mode): 1.8 cm      PULMONARY VEINS                             Diastolic Velocity: 54.40 cm/s                             S/D Velocity:       1.50                             Systolic Velocity:  80.60 cm/s LEFT ATRIUM             Index        RIGHT ATRIUM            Index LA diam:        3.80 cm 2.04 cm/m   RA Area:     15.90 cm LA Vol (A2C):   56.0 ml 30.01 ml/m  RA Volume:   40.40 ml  21.65 ml/m LA Vol (A4C):   72.0 ml 38.59 ml/m LA Biplane Vol: 63.5 ml 34.03 ml/m  AORTIC VALVE LVOT Vmax:   76.40 cm/s LVOT Vmean:  49.400 cm/s LVOT VTI:    0.154 m  AORTA Ao Root diam: 3.20 cm MITRAL VALVE               TRICUSPID VALVE MV Area (PHT): 4.68 cm    TR Peak grad:   28.1 mmHg MV Decel Time: 162 msec    TR Vmax:        265.00 cm/s MR Peak grad: 117.9 mmHg MR Vmax:      543.00 cm/s  SHUNTS MV E velocity: 79.80 cm/s  Systemic VTI:  0.15 m MV Woodley Petzold velocity: 64.30 cm/s  Systemic Diam: 1.90 cm MV E/Kilynn Fitzsimmons ratio:  1.24 Franck Azobou Tonleu Electronically signed by Joelle Cedars Tonleu Signature Date/Time: 01/23/2024/4:10:59 PM    Final    DG Pelvis 1-2 Views Result Date: 01/23/2024 CLINICAL DATA:  Pain. EXAM: PELVIS - 1-2 VIEW COMPARISON:  10/31/2023 FINDINGS: No evidence for an acute fracture. SI joints and symphysis pubis unremarkable. Status post right total hip replacement. IMPRESSION: 1. No acute bony findings. 2. Status post right total hip replacement. Electronically Signed   By: Camellia Candle M.D.   On: 01/23/2024 09:15   DG Toe Great Left Result Date: 01/22/2024 EXAM: 3 VIEW(S) XRAY OF THE LEFT TOES 01/22/2024 11:15:00 PM COMPARISON: None available. CLINICAL HISTORY: Pain FINDINGS: BONES AND JOINTS: No acute fracture. No malalignment. SOFT TISSUES: Soft tissue edema. IMPRESSION: 1. No acute findings. Electronically signed by: Pinkie Pebbles MD MD 01/22/2024 11:32 PM EST RP Workstation: [id number]   CT Head Wo Contrast Result Date: 01/22/2024 EXAM: CT HEAD WITHOUT CONTRAST 01/22/2024 06:34:20 PM TECHNIQUE: CT of the head was performed without the administration of intravenous contrast. Automated exposure control, iterative reconstruction, and/or weight based adjustment of the mA/kV was utilized to reduce the radiation dose to as low as reasonably achievable. COMPARISON:  None available. CLINICAL HISTORY: Head trauma, minor (Age >= 65y); Mental status change, unknown cause. FINDINGS: BRAIN AND VENTRICLES: No acute hemorrhage. No  evidence of acute infarct. No hydrocephalus. No extra-axial collection. No mass effect or midline shift. Parenchymal volume loss is commensurate with the patient's age. Periventricular white matter changes are present likely reflecting the sequela of small vessel ischemia. ORBITS: No acute abnormality. SINUSES: No acute abnormality. SOFT TISSUES AND SKULL: No acute soft tissue abnormality. No skull fracture. IMPRESSION: 1. No acute intracranial abnormality. 2. Parenchymal volume loss commensurate with the patient's age. 3. Periventricular white matter changes likely reflecting the sequela of small vessel ischemia. Electronically signed by: Dorethia Molt MD MD 01/22/2024 06:39 PM EST RP Workstation: [id number]   DG Chest 2 View Result Date: 01/22/2024 EXAM: 2 VIEW(S) XRAY OF THE CHEST 01/22/2024 06:27:51 PM COMPARISON: 10/30/2023 CLINICAL HISTORY: AMS (altered mental status) FINDINGS: LINES, TUBES AND DEVICES: Left subclavian cardiac rhythm maintenance device stable in position. LUNGS AND PLEURA: No focal pulmonary opacity. No pleural effusion. No pneumothorax. HEART AND MEDIASTINUM: Aortic atherosclerosis. Moderate hiatal hernia. No acute abnormality of the cardiac and mediastinal silhouettes. BONES AND SOFT TISSUES: Exaggerated thoracic kyphosis. No acute osseous abnormality. IMPRESSION: 1. No acute cardiopulmonary findings. 2. Stable left subclavian cardiac rhythm maintenance device. 3. Aortic atherosclerosis. 4. Moderate hiatal hernia. 5. Exaggerated thoracic kyphosis. Electronically signed by: Dorethia Molt MD MD 01/22/2024 06:36 PM EST RP Workstation: [id number]        Scheduled Meds:  apixaban   5 mg Oral BID   ascorbic acid   500 mg Oral Daily   cefadroxil   1,000 mg Oral BID   dorzolamide -timolol   1 drop Both Eyes BID   ezetimibe   10 mg  Oral Daily   insulin  aspart  0-9 Units Subcutaneous TID WC   metFORMIN   1,000 mg Oral BID WC   pantoprazole   40 mg Oral BID   Continuous Infusions:  lactated ringers  Stopped (01/22/24 2120)     LOS: 0 days    Time spent: over 30 min     Meliton Monte, MD [facility]   To contact the attending provider between 7A-7P or the covering provider during after hours 7P-7A, please log into the web site [email or web address] and access using universal [place] password for that web site. If you do not have the password, please call the hospital operator.  01/24/2024, 2:06 PM    "

## 2024-01-25 ENCOUNTER — Ambulatory Visit: Payer: Self-pay | Admitting: Cardiovascular Disease

## 2024-01-25 LAB — COMPREHENSIVE METABOLIC PANEL WITH GFR
ALT: 23 U/L (ref 0–44)
AST: 26 U/L (ref 15–41)
Albumin: 2.8 g/dL — ABNORMAL LOW (ref 3.5–5.0)
Alkaline Phosphatase: 74 U/L (ref 38–126)
Anion gap: 9 (ref 5–15)
BUN: 16 mg/dL (ref 8–23)
CO2: 24 mmol/L (ref 22–32)
Calcium: 9.3 mg/dL (ref 8.9–10.3)
Chloride: 107 mmol/L (ref 98–111)
Creatinine, Ser: 0.83 mg/dL (ref 0.61–1.24)
GFR, Estimated: >60 mL/min
Glucose, Bld: 114 mg/dL — ABNORMAL HIGH (ref 70–99)
Potassium: 3.8 mmol/L (ref 3.5–5.1)
Sodium: 139 mmol/L (ref 135–145)
Total Bilirubin: 0.4 mg/dL (ref 0.0–1.2)
Total Protein: 5.2 g/dL — ABNORMAL LOW (ref 6.5–8.1)

## 2024-01-25 LAB — CBC WITH DIFFERENTIAL/PLATELET
Abs Immature Granulocytes: 0.02 K/uL (ref 0.00–0.07)
Basophils Absolute: 0 K/uL (ref 0.0–0.1)
Basophils Relative: 0 %
Eosinophils Absolute: 0.1 K/uL (ref 0.0–0.5)
Eosinophils Relative: 2 %
HCT: 29.9 % — ABNORMAL LOW (ref 39.0–52.0)
Hemoglobin: 10.1 g/dL — ABNORMAL LOW (ref 13.0–17.0)
Immature Granulocytes: 0 %
Lymphocytes Relative: 24 %
Lymphs Abs: 1.2 K/uL (ref 0.7–4.0)
MCH: 32.5 pg (ref 26.0–34.0)
MCHC: 33.8 g/dL (ref 30.0–36.0)
MCV: 96.1 fL (ref 80.0–100.0)
Monocytes Absolute: 0.4 K/uL (ref 0.1–1.0)
Monocytes Relative: 9 %
Neutro Abs: 3 K/uL (ref 1.7–7.7)
Neutrophils Relative %: 65 %
Platelets: 343 K/uL (ref 150–400)
RBC: 3.11 MIL/uL — ABNORMAL LOW (ref 4.22–5.81)
RDW: 13.2 % (ref 11.5–15.5)
WBC: 4.7 K/uL (ref 4.0–10.5)
nRBC: 0 % (ref 0.0–0.2)

## 2024-01-25 LAB — SYPHILIS: RPR W/REFLEX TO RPR TITER AND TREPONEMAL ANTIBODIES, TRADITIONAL SCREENING AND DIAGNOSIS ALGORITHM: RPR Ser Ql: NONREACTIVE

## 2024-01-25 LAB — PHOSPHORUS: Phosphorus: 2.4 mg/dL — ABNORMAL LOW (ref 2.5–4.6)

## 2024-01-25 LAB — GLUCOSE, CAPILLARY
Glucose-Capillary: 107 mg/dL — ABNORMAL HIGH (ref 70–99)
Glucose-Capillary: 185 mg/dL — ABNORMAL HIGH (ref 70–99)
Glucose-Capillary: 77 mg/dL (ref 70–99)
Glucose-Capillary: 164 mg/dL — ABNORMAL HIGH (ref 70–99)

## 2024-01-25 LAB — MAGNESIUM: Magnesium: 1.6 mg/dL — ABNORMAL LOW (ref 1.7–2.4)

## 2024-01-25 LAB — C-REACTIVE PROTEIN: CRP: 4.3 mg/dL — ABNORMAL HIGH

## 2024-01-25 MED ORDER — MAGNESIUM SULFATE 2 GM/50ML IV SOLN
2.0000 g | Freq: Once | INTRAVENOUS | Status: AC
  Administered 2024-01-25: 2 g via INTRAVENOUS
  Filled 2024-01-25: qty 50

## 2024-01-25 MED ORDER — AMLODIPINE BESYLATE 5 MG PO TABS
2.5000 mg | ORAL_TABLET | Freq: Every day | ORAL | Status: DC
  Administered 2024-01-25 – 2024-01-27 (×3): 2.5 mg via ORAL
  Filled 2024-01-25 (×3): qty 1

## 2024-01-25 NOTE — Progress Notes (Addendum)
 " PROGRESS NOTE    Travis [name]  FMW:[id number] DOB: 08/29/1940 DOA: 01/22/2024 PCP: [name], MD  Chief Complaint  Patient presents with   Altered Mental Status    Brief Narrative:   Travis [name] is Travis [name] 84 y.o. male with medical history significant of HTN, HLD, SSS s/p PPM, T2DM, PAF on eliquis  who presented to ED for AMS after being found sitting on floor, covered in feces for unknown amount of time.   He was admitted for AMS.  Now Travis [name]&O, back to baseline.  Working on SNF.  Neurology referral at discharge given his recent decline.    Assessment & Plan:   Principal Problem:   Acute encephalopathy Active Problems:   Rhabdomyolysis   CAD with elevated troponin   Persistent atrial fibrillation (HCC)   Benign essential HTN   Type 2 diabetes mellitus without complication, without long-term current use of insulin  (HCC)   Venous stasis   SSS (sick sinus syndrome) (HCC)   Chronic kidney disease, stage 3a (HCC)  Acute Metabolic Encephalopathy Found at home sitting in feces, naked.  Hallucinating.  Has declined since 10/2023 fall with R total hip replacement.  For years, his daughter has noticed that he's moving more slowly.  Shuffling gait.  1-2 hrs to get ready.  Everything taking Travis [name] lot longer.  Cognitively she hadn't noticed any particular concerns. Suspect underlying cognitive impairment, dementia (parkinson's, lewy body?) Currently Travis [name]&O, but question underlying chronic cognitive deficit (dementia?) - will need more hx from family MRI brain pending Head CT without acute abnormality CXR without acute cardiopulm findings UA not c/w UTI  Blood culture pending CRP elevated, unclear cause B12 wnl, ammonia wnl, VBG without hypercarbia, TSH wnl B1, RPR non reactive, HIV non reactive Recent admission 12/24/2023 for encephalopathy - at that time his opiate medicines were discontinued. Discussed with daughter on 1/12, concern for dementia not generally an indication for inpatient  neurology consult.  Typically is [facility] Rocque outpatient neurology workup.  Holding off for now, plan for outpatient referral.    Elevated Inflammatory Marker CRP elevated, being treated for possible cellulitis L toe wound, plain film without acute findings Trend CRP   Bilateral LE Edema LE US  without DVT Echo with preserved EF.  Albumin 2.5.   UA with 100 mg/dl protein - follow UP/C Being treated empirically for cellulitis, will plan for short course - 5 days duricef  Toe Wound Palpable DP pulses bilaterally No acute findings to L great toe on plain films  CAD Elevated Troponin Suspected demand ischemia No RWMA on echo  Hypertension BP in 150's today Doesn't seem to be taking any meds - will start low dose amlodipine  given his age  Atrial Fibrillation SSS s/p PPM Eliquis  Pacemaker interrogation pending  Peripheral Neuropathy  B12 wnl TSH wnl, A1c 6.8 Sounds like he first noticed this after his fall? Will hold on gabapentin with concern for intermittent encephalopathy above, but if it's bothersome enough, consider low dose  Right Hip Replacement Deconditioning Living independently as of 2 weeks ago  In 2025 Has declined since his surgery Continue therapy Planning for SNF  GERD PPI  T2DM Hold metformin  SSI for now  Elevated CK mild    DVT prophylaxis: eliquis  Code Status: full Family Communication: daughter 1/12 over phone.  Called 1/13, no answer. Disposition:   Status is: Inpatient Remains inpatient appropriate because: need for continued inpatient carae   Consultants:  none  Procedures:    Antimicrobials:  Anti-infectives (From admission, onward)  Start     Dose/Rate Route Frequency Ordered Stop   01/23/24 1000  cefadroxil  (DURICEF) capsule 1,000 mg        1,000 mg Oral 2 times daily 01/23/24 0814 01/28/24 0959       Subjective: No new complaints Notes confusion prior to hospitalization - increasing in frequency  Objective: Vitals:    01/25/24 0024 01/25/24 0401 01/25/24 0802 01/25/24 1128  BP: (!) 159/77 (!) 155/73 (!) 175/77 (!) 159/70  Pulse: 65 62 67 68  Resp: 16 17    Temp: 98.1 F (36.7 C) 97.9 F (36.6 C) 98 F (36.7 C) 98.9 F (37.2 C)  TempSrc:   Oral Oral  SpO2: 96% 98% 100% 100%  Weight:      Height:        Intake/Output Summary (Last 24 hours) at 01/25/2024 1608 Last data filed at 01/25/2024 1000 Gross per 24 hour  Intake --  Output 900 ml  Net -900 ml   Filed Weights   01/23/24 0900  Weight: 69.4 kg    Examination:  General: No acute distress. Cardiovascular: RRR Lungs: unlabored Neurological: Alert and oriented 3. Moves all extremities 4 with equal strength. Cranial nerves II through XII grossly intact. Extremities: No clubbing or cyanosis. No edema.   Data Reviewed: I have personally reviewed following labs and imaging studies  CBC: Recent Labs  Lab 01/22/24 1750 01/23/24 0611 01/24/24 0432 01/25/24 0429  WBC 9.8 6.2 4.8 4.7  NEUTROABS 8.5*  --   --  3.0  HGB 11.5* 10.0* 9.8* 10.1*  HCT 34.3* 30.2* 29.3* 29.9*  MCV 96.3 98.1 97.0 96.1  PLT 342 316 317 343    Basic Metabolic Panel: Recent Labs  Lab 01/22/24 1750 01/22/24 2249 01/23/24 0611 01/24/24 0432 01/25/24 0429  NA 139  --  141 137 139  K 3.8  --  3.7 4.0 3.8  CL 103  --  107 106 107  CO2 22  --  23 21* 24  GLUCOSE 178*  --  116* 101* 114*  BUN 24*  --  18 15 16   CREATININE 1.10  --  1.00 0.78 0.83  CALCIUM  10.4*  --  9.7 9.0 9.3  MG  --  1.5* 2.1 1.7 1.6*  PHOS  --  3.3  --   --  2.4*    GFR: Estimated Creatinine Clearance: 65 mL/min (by C-G formula based on SCr of 0.83 mg/dL).  Liver Function Tests: Recent Labs  Lab 01/22/24 1750 01/23/24 0611 01/24/24 0432 01/25/24 0429  AST 58* 42* 37 26  ALT 26 21 22 23   ALKPHOS 103 77 74 74  BILITOT 1.3* 0.9 0.5 0.4  PROT 6.8 5.5* 5.1* 5.2*  ALBUMIN 3.6 3.1* 2.6* 2.8*    CBG: Recent Labs  Lab 01/24/24 0735 01/24/24 1553 01/24/24 2136  01/25/24 0803 01/25/24 1130  GLUCAP 98 117* 173* 107* 185*     Recent Results (from the past 240 hours)  Blood culture (routine x 2)     Status: None (Preliminary result)   Collection Time: 01/22/24  5:50 PM   Specimen: Right Antecubital; Blood  Result Value Ref Range Status   Specimen Description   Final    RIGHT ANTECUBITAL BLOOD Performed at [facility], [address] [place], [place] [address]    Special Requests   Final    BOTTLES DRAWN AEROBIC AND ANAEROBIC Blood Culture adequate volume Performed at [facility], [address], [place], [place] [address]  Culture   Final    NO GROWTH 3 DAYS Performed at [facility], [address] [place], [place] [address]    Report Status PENDING  Incomplete  Resp panel by RT-PCR (RSV, Flu Blanche Gallien&B, Covid) Anterior Nasal Swab     Status: None   Collection Time: 01/22/24  7:02 PM   Specimen: Anterior Nasal Swab  Result Value Ref Range Status   SARS Coronavirus 2 by RT PCR NEGATIVE NEGATIVE Final    Comment: (NOTE) SARS-CoV-2 target nucleic acids are NOT DETECTED.  The SARS-CoV-2 RNA is generally detectable in upper respiratory specimens during the acute phase of infection. The lowest concentration of SARS-CoV-2 viral copies this assay can detect is 138 copies/mL. Oaklee Esther negative result does not preclude SARS-Cov-2 infection and should not be used as the sole basis for treatment or other patient management decisions. Augusta Hilbert negative result may occur with  improper specimen collection/handling, submission of specimen other than nasopharyngeal swab, presence of viral mutation(s) within the areas targeted by this assay, and inadequate number of viral copies(<138 copies/mL). Lemoine Goyne negative result must be combined with clinical observations, patient history, and epidemiological information. The expected result is Negative.  Fact Sheet for Patients:  bloggercourse.com  Fact Sheet for  Healthcare Providers:  seriousbroker.it  This test is no t yet approved or cleared by the [place]  FDA and  has been authorized for detection and/or diagnosis of SARS-CoV-2 by FDA under an Emergency Use Authorization (EUA). This EUA will remain  in effect (meaning this test can be used) for the duration of the COVID-19 declaration under Section 564(b)(1) of the Act, 21 U.S.C.section 360bbb-3(b)(1), unless the authorization is terminated  or revoked sooner.       Influenza Dagoberto Nealy by PCR NEGATIVE NEGATIVE Final   Influenza B by PCR NEGATIVE NEGATIVE Final    Comment: (NOTE) The Xpert Xpress SARS-CoV-2/FLU/RSV plus assay is intended as an aid in the diagnosis of influenza from Nasopharyngeal swab specimens and should not be used as Erbie Arment sole basis for treatment. Nasal washings and aspirates are unacceptable for Xpert Xpress SARS-CoV-2/FLU/RSV testing.  Fact Sheet for Patients: bloggercourse.com  Fact Sheet for Healthcare Providers: seriousbroker.it  This test is not yet approved or cleared by the [place]  FDA and has been authorized for detection and/or diagnosis of SARS-CoV-2 by FDA under an Emergency Use Authorization (EUA). This EUA will remain in effect (meaning this test can be used) for the duration of the COVID-19 declaration under Section 564(b)(1) of the Act, 21 U.S.C. section 360bbb-3(b)(1), unless the authorization is terminated or revoked.     Resp Syncytial Virus by PCR NEGATIVE NEGATIVE Final    Comment: (NOTE) Fact Sheet for Patients: bloggercourse.com  Fact Sheet for Healthcare Providers: seriousbroker.it  This test is not yet approved or cleared by the [place]  FDA and has been authorized for detection and/or diagnosis of SARS-CoV-2 by FDA under an Emergency Use Authorization (EUA). This EUA will remain in effect (meaning this  test can be used) for the duration of the COVID-19 declaration under Section 564(b)(1) of the Act, 21 U.S.C. section 360bbb-3(b)(1), unless the authorization is terminated or revoked.  Performed at [facility], [address] [place], [place] [address]   Blood culture (routine x 2)     Status: None (Preliminary result)   Collection Time: 01/23/24 10:27 AM   Specimen: BLOOD LEFT ARM  Result Value Ref Range Status   Specimen Description BLOOD LEFT ARM  Final   Special Requests  Final    BOTTLES DRAWN AEROBIC AND ANAEROBIC Blood Culture adequate volume   Culture   Final    NO GROWTH 2 DAYS Performed at [facility], [address], [place], [place] [address]    Report Status PENDING  Incomplete         Radiology Studies: MR BRAIN WO CONTRAST Result Date: 01/24/2024 EXAM: MRI BRAIN WITHOUT CONTRAST 01/24/2024 12:52:54 PM TECHNIQUE: Multiplanar multisequence MRI of the head/brain was performed without the administration of intravenous contrast. COMPARISON: Head CT 01/22/2024. CLINICAL HISTORY: Mental status change, unknown cause. FINDINGS: BRAIN AND VENTRICLES: There is no evidence of an acute infarct, intracranial hemorrhage, mass, midline shift, hydrocephalus, or extra-axial fluid collection. Cerebral white matter T2 hyperintensities are nonspecific but compatible with mild chronic small vessel ischemic disease. Numerous small T2 hyperintensities in the basal ganglia bilaterally appeared to predominantly reflect dilated perivascular spaces, possibly with some interspersed chronic lacunar infarcts as well. There is moderate generalized cerebral atrophy. Small and poorly visualized/hypoplastic distal left vertebral artery. Other major intracranial arterial flow voids are preserved. ORBITS: Right cataract extraction. SINUSES AND MASTOIDS: Trace right mastoid effusion. Minimal mucosal thickening in the paranasal sinuses. BONES AND SOFT TISSUES: Normal marrow signal.  No significant soft tissue abnormality. IMPRESSION: 1. No acute intracranial abnormality. 2. Mild chronic small vessel ischemic disease. Electronically signed by: Dasie Hamburg MD MD 01/24/2024 02:39 PM EST RP Workstation: [id number]        Scheduled Meds:  apixaban   5 mg Oral BID   ascorbic acid   500 mg Oral Daily   cefadroxil   1,000 mg Oral BID   dorzolamide -timolol   1 drop Both Eyes BID   ezetimibe   10 mg Oral Daily   insulin  aspart  0-9 Units Subcutaneous TID WC   pantoprazole   40 mg Oral BID   Continuous Infusions:  lactated ringers  Stopped (01/22/24 2120)     LOS: 1 day    Time spent: over 30 min     Meliton Monte, MD [facility]   To contact the attending provider between 7A-7P or the covering provider during after hours 7P-7A, please log into the web site [email or web address] and access using universal [place] password for that web site. If you do not have the password, please call the hospital operator.  01/25/2024, 4:08 PM    "

## 2024-01-25 NOTE — Progress Notes (Signed)
 Physical Therapy Treatment Patient Details Name: Travis [name] MRN: [id number] DOB: 03/01/1940 Today's Date: 01/25/2024   History of Present Illness 84 yo M adm 1/10 AMS found down on ground hallunicating. With currne workup for acute metabolic encephalopathy, PMH 10/25 fall in 10k race with R THA, sick sinus syndrome s/p PPM/paroxysmal A-fib on Eliquis , CAD, CKD stage IIIa, DM2, glaucoma, hearing loss, OA. `    PT Comments  Patient progressing to short distance ambulation with +2 A for safety with chair follow.  Remains weak and tolerates limited amount of mobility due to fatigue and pain in his feet.  Feel he is appropriate for post-acute inpatient rehab (<3 hours.day) prior to d/c home.     If plan is discharge home, recommend the following: A lot of help with walking and/or transfers;A lot of help with bathing/dressing/bathroom;Assistance with cooking/housework;Assist for transportation;Help with stairs or ramp for entrance   Can travel by private vehicle        Equipment Recommendations  None recommended by PT    Recommendations for Other Services       Precautions / Restrictions Precautions Precautions: Fall Recall of Precautions/Restrictions: Impaired     Mobility  Bed Mobility Overal bed mobility: Needs Assistance Bed Mobility: Rolling, Supine to Sit Rolling: Mod assist, Used rails   Supine to sit: Mod assist, HOB elevated, Used rails     General bed mobility comments: pt needs increased time. pt unable to slide RLE toward R side ( abduct) without physical (A) . pt increased time to motor plan LLE    Transfers Overall transfer level: Needs assistance Equipment used: Rolling walker (2 wheels) Transfers: Sit to/from Stand Sit to Stand: +2 physical assistance, Mod assist, From elevated surface           General transfer comment: pt needs increased time and rocking momentum to come to standing    Ambulation/Gait Ambulation/Gait assistance: Mod assist, +2  safety/equipment Gait Distance (Feet): 6 Feet Assistive device: Rolling walker (2 wheels) Gait Pattern/deviations: Step-to pattern, Decreased stride length, Shuffle, Trunk flexed       General Gait Details: cues for posture, +2 progressing to +1 with chair follow to walk to closet in room A to move walker at times   Stairs             Wheelchair Mobility     Tilt Bed    Modified Rankin (Stroke Patients Only)       Balance Overall balance assessment: Needs assistance Sitting-balance support: Feet supported Sitting balance-Leahy Scale: Fair     Standing balance support: Bilateral upper extremity supported, Reliant on assistive device for balance Standing balance-Leahy Scale: Poor                              Communication Communication Communication: Impaired Factors Affecting Communication: Hearing impaired (donned hearing aides)  Cognition Arousal: Alert Behavior During Therapy: WFL for tasks assessed/performed   PT - Cognitive impairments: No apparent impairments                       PT - Cognition Comments: follows commands well, directing care for compression socks and realizing he cannot put them on himself, requesting his paperwork from home within reach Following commands: Intact      Cueing    Exercises      General Comments General comments (skin integrity, edema, etc.): B heels with heel dressings, noted breakdown on R  buttock skin fold; daughter present initially and supportive.      Pertinent Vitals/Pain Pain Assessment Pain Assessment: Faces Faces Pain Scale: Hurts little more Pain Location: heels of bil LE Pain Descriptors / Indicators: Discomfort, Sore Pain Intervention(s): Monitored during session, Repositioned    Home Living Family/patient expects to be discharged to:: Skilled nursing facility                   Additional Comments: pt recent admission to [facility] with d/c home. pt lives alone at baseline.  Pt at this time requesting and seeking increase level of care    Prior Function            PT Goals (current goals can now be found in the care plan section) Progress towards PT goals: Progressing toward goals    Frequency    Min 2X/week      PT Plan      Co-evaluation PT/OT/SLP Co-Evaluation/Treatment: Yes Reason for Co-Treatment: For patient/therapist safety;To address functional/ADL transfers PT goals addressed during session: Mobility/safety with mobility;Balance;Proper use of DME OT goals addressed during session: ADL's and self-care      AM-PAC PT 6 Clicks Mobility   Outcome Measure  Help needed turning from your back to your side while in a flat bed without using bedrails?: A Lot Help needed moving from lying on your back to sitting on the side of a flat bed without using bedrails?: Total Help needed moving to and from a bed to a chair (including a wheelchair)?: Total Help needed standing up from a chair using your arms (e.g., wheelchair or bedside chair)?: Total Help needed to walk in hospital room?: Total Help needed climbing 3-5 steps with a railing? : Total 6 Click Score: 7    End of Session Equipment Utilized During Treatment: Gait belt Activity Tolerance: Patient limited by fatigue Patient left: in chair;with call bell/phone within reach Nurse Communication: Mobility status PT Visit Diagnosis: Other abnormalities of gait and mobility (R26.89);Muscle weakness (generalized) (M62.81)     Time: 1030-1058 PT Time Calculation (min) (ACUTE ONLY): 28 min  Charges:    $Therapeutic Activity: 8-22 mins PT General Charges $$ ACUTE PT VISIT: 1 Visit                     Micheline Portal, PT Acute Rehabilitation Services [phone] 01/25/2024    [name] Portal 01/25/2024, 2:16 PM

## 2024-01-25 NOTE — Evaluation (Signed)
 Occupational Therapy Evaluation Patient Details Name: Travis [name] MRN: [id number] DOB: 29-Nov-1940 Today's Date: 01/25/2024   History of Present Illness   84 yo M adm 1/10 AMS found down on ground hallunicating. With currne workup for acute metabolic encephalopathy, PMH 10/25 fall in 10k race with R THA, sick sinus syndrome s/p PPM/paroxysmal A-fib on Eliquis , CAD, CKD stage IIIa, DM2, glaucoma, hearing loss, OA. `     Clinical Impressions PT admitted with acute encephalopathy. Pt currently with functional limitiations due to the deficits listed below (see OT problem list). Pt at this time requires increased surfaces and two person (A) for transfers. Pt noted to have skin break down on buttock. Pt motivated to participate and regain strength.  Pt will benefit from skilled OT to increase their independence and safety with adls and balance to allow discharge intensive inpatient follow-up therapy, >3 hours/day. If patient is denied will need community based rehab.      If plan is discharge home, recommend the following:   Two people to help with walking and/or transfers;Two people to help with bathing/dressing/bathroom     Functional Status Assessment   Patient has had a recent decline in their functional status and demonstrates the ability to make significant improvements in function in a reasonable and predictable amount of time.     Equipment Recommendations   None recommended by OT     Recommendations for Other Services   Rehab consult (currently denial in chart so will need higher level of care)     Precautions/Restrictions   Precautions Precautions: Fall Restrictions Weight Bearing Restrictions Per Provider Order: No     Mobility Bed Mobility Overal bed mobility: Needs Assistance Bed Mobility: Rolling, Supine to Sit Rolling: Mod assist, Used rails   Supine to sit: Mod assist, HOB elevated, Used rails (HOB 50 degrees) Sit to supine: +2 for physical  assistance, Mod assist, HOB elevated (very elevated with hips higher than knees)   General bed mobility comments: pt needs increased time. pt unable to slide RLE toward R side ( abduct) without physical (A) . pt increased time to motor plan LLE    Transfers Overall transfer level: Needs assistance Equipment used: Rolling walker (2 wheels) Transfers: Sit to/from Stand Sit to Stand: +2 physical assistance, Mod assist, From elevated surface           General transfer comment: pt needs increased time and rocking momentum to come to standing      Balance   Sitting-balance support: Bilateral upper extremity supported, Feet supported Sitting balance-Leahy Scale: Fair     Standing balance support: Bilateral upper extremity supported, During functional activity, Reliant on assistive device for balance                               ADL either performed or assessed with clinical judgement   ADL Overall ADL's : Needs assistance/impaired Eating/Feeding: Set up;Sitting   Grooming: Set up;Sitting   Upper Body Bathing: Set up;Sitting   Lower Body Bathing: Maximal assistance;Sit to/from stand           Toilet Transfer: +2 for physical assistance;Moderate assistance;BSC/3in1;Rolling walker (2 wheels) (elevated surface)           Functional mobility during ADLs: +2 for physical assistance;Minimal assistance;Rolling walker (2 wheels)       Vision Baseline Vision/History: 3 Glaucoma Patient Visual Report: No change from baseline       Perception  Praxis         Pertinent Vitals/Pain Pain Assessment Pain Assessment: Faces Faces Pain Scale: Hurts little more Pain Location: heels of bil LE Pain Descriptors / Indicators: Discomfort, Sore Pain Intervention(s): Monitored during session, Repositioned     Extremity/Trunk Assessment Upper Extremity Assessment Upper Extremity Assessment: Generalized weakness;Right hand dominant;LUE deficits/detail LUE  Deficits / Details: noted to have some edema at the forearm and prior IV removed on this arm   Lower Extremity Assessment Lower Extremity Assessment: Defer to PT evaluation RLE Deficits / Details: reports increased pain on this LE compared to LLE   Cervical / Trunk Assessment Cervical / Trunk Assessment: Kyphotic   Communication Communication Communication: Impaired Factors Affecting Communication: Hearing impaired;Reduced clarity of speech (hearing aids present and pt able to don indep)   Cognition Arousal: Alert Behavior During Therapy: WFL for tasks assessed/performed               OT - Cognition Comments: oriented to place time and self at this time. pt following 2 step commands. pt making request of needs and wants                 Following commands: Intact       Cueing  General Comments   Cueing Techniques: Verbal cues;Gestural cues  pt noted to have break down at R skin fold of R buttock. Wound appears to be partly moisture related in nature   Exercises     Shoulder Instructions      Home Living Family/patient expects to be discharged to:: Skilled nursing facility                                 Additional Comments: pt recent admission to [facility] with d/c home. pt lives alone at baseline. Pt at this time requesting and seeking increase level of care  Lives With: Alone    Prior Functioning/Environment Prior Level of Function : Independent/Modified Independent             Mobility Comments: using walker to get get around the house since d/c ADLs Comments: indep    OT Problem List: Decreased strength;Decreased activity tolerance;Impaired balance (sitting and/or standing);Decreased safety awareness;Decreased knowledge of use of DME or AE;Decreased knowledge of precautions;Pain   OT Treatment/Interventions: Self-care/ADL training;Therapeutic exercise;Energy conservation;DME and/or AE instruction;Manual therapy;Modalities;Therapeutic  activities;Balance training;Patient/family education      OT Goals(Current goals can be found in the care plan section)   Acute Rehab OT Goals Patient Stated Goal: to get his strength back OT Goal Formulation: With patient/family Time For Goal Achievement: 02/08/24 Potential to Achieve Goals: Good   OT Frequency:  Min 2X/week    Co-evaluation PT/OT/SLP Co-Evaluation/Treatment: Yes Reason for Co-Treatment: For patient/therapist safety;To address functional/ADL transfers   OT goals addressed during session: ADL's and self-care      AM-PAC OT 6 Clicks Daily Activity     Outcome Measure Help from another person eating meals?: None Help from another person taking care of personal grooming?: A Little Help from another person toileting, which includes using toliet, bedpan, or urinal?: A Lot Help from another person bathing (including washing, rinsing, drying)?: A Lot Help from another person to put on and taking off regular upper body clothing?: A Little Help from another person to put on and taking off regular lower body clothing?: Total 6 Click Score: 15   End of Session Equipment Utilized During Treatment: Rolling walker (2 wheels);Gait  belt Nurse Communication: Mobility status;Precautions  Activity Tolerance: Patient tolerated treatment well Patient left: in chair;with call bell/phone within reach;with family/visitor present  OT Visit Diagnosis: Unsteadiness on feet (R26.81);Muscle weakness (generalized) (M62.81)                Time: 8967-8944 OT Time Calculation (min): 23 min Charges:  OT General Charges $OT Visit: 1 Visit OT Evaluation $OT Eval Moderate Complexity: 1 Mod   Brynn, OTR/L  Acute Rehabilitation Services Office: [phone] .   Ely Molt 01/25/2024, 11:14 AM

## 2024-01-25 NOTE — Plan of Care (Signed)
" °  Problem: Education: Goal: Ability to describe self-care measures that may prevent or decrease complications (Diabetes Survival Skills Education) will improve Outcome: Progressing Goal: Individualized Educational Video(s) Outcome: Progressing   Problem: Coping: Goal: Ability to adjust to condition or change in health will improve Outcome: Progressing   Problem: Metabolic: Goal: Ability to maintain appropriate glucose levels will improve Outcome: Progressing   Problem: Nutritional: Goal: Maintenance of adequate nutrition will improve Outcome: Progressing Goal: Progress toward achieving an optimal weight will improve Outcome: Progressing   Problem: Skin Integrity: Goal: Risk for impaired skin integrity will decrease Outcome: Progressing   Problem: Clinical Measurements: Goal: Will remain free from infection Outcome: Progressing   Problem: Clinical Measurements: Goal: Ability to maintain clinical measurements within normal limits will improve Outcome: Progressing Goal: Will remain free from infection Outcome: Progressing Goal: Diagnostic test results will improve Outcome: Progressing Goal: Respiratory complications will improve Outcome: Progressing Goal: Cardiovascular complication will be avoided Outcome: Progressing   Problem: Coping: Goal: Level of anxiety will decrease Outcome: Progressing   Problem: Elimination: Goal: Will not experience complications related to bowel motility Outcome: Progressing Goal: Will not experience complications related to urinary retention Outcome: Progressing   "

## 2024-01-25 NOTE — TOC Initial Note (Signed)
 Transition of Care [facility]) - Initial/Assessment Note    Patient Details  Name: Travis [name] MRN: [id number] Date of Birth: 01/02/1941  Transition of Care [facility]) CM/SW Contact:    Sherline Clack, LCSWA Phone Number: 01/25/2024, 3:39 PM  Clinical Narrative:                  CSW spoke with patient's daughter regarding SNF placement. Daughter requested patient be faxed out to [facility] and [place]. CSW reached out to [facility] to ask about bed availability; unfortunately, [facility] does not anticipate beds opening up soon. CSW reached out to [facility] to ask about bed availability and is waiting to hear back from admissions person. CSW will continue to follow and update DC plan.    Expected Discharge Plan: Skilled Nursing Facility Barriers to Discharge: SNF Pending bed offer   Patient Goals and CMS Choice     Choice offered to / list presented to : Adult Children      Expected Discharge Plan and Services       Living arrangements for the past 2 months: Single Family Home                                      Prior Living Arrangements/Services Living arrangements for the past 2 months: Single Family Home Lives with:: Adult Children Patient language and need for interpreter reviewed:: Yes                 Activities of Daily Living   ADL Screening (condition at time of admission) Independently performs ADLs?: Yes (appropriate for developmental age) Is the patient deaf or have difficulty hearing?: Yes (has bilat hearing aides) Does the patient have difficulty seeing, even when wearing glasses/contacts?: No Does the patient have difficulty concentrating, remembering, or making decisions?: Yes  Permission Sought/Granted                  Emotional Assessment Appearance:: Appears stated age Attitude/Demeanor/Rapport: Unable to Assess Affect (typically observed): Unable to Assess Orientation: : Oriented to Self, Oriented to  Place, Oriented to  Time, Oriented to Situation      Admission diagnosis:  Acute encephalopathy [G93.40] Generalized weakness [R53.1] Altered mental status, unspecified altered mental status type [R41.82] Patient Active Problem List   Diagnosis Date Noted   Acute encephalopathy 01/22/2024   Rhabdomyolysis 01/22/2024   Venous stasis 01/22/2024   Benign essential HTN 01/22/2024   Type 2 diabetes mellitus without complication, without long-term current use of insulin  (HCC) 12/18/2023   Cognitive changes 11/16/2023   Malnutrition of moderate degree 11/12/2023   H/O total hip arthroplasty, right 11/09/2023   Fracture of femoral neck, right (HCC) 11/09/2023   Debility 11/09/2023   S/P total right hip arthroplasty 10/31/2023   Other fracture of right femur, initial encounter for closed fracture (HCC) 10/30/2023   Persistent atrial fibrillation (HCC) 10/01/2023   Chronic kidney disease, stage 3a (HCC) 07/06/2023   Elevated PSA 07/06/2023   Swelling of right hand 07/06/2023   Urticaria 07/06/2023   Closed fracture of nasal bones 12/27/2020   Impacted cerumen of left ear 12/27/2020   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   Proteinuria 10/13/2018   Encounter for general adult medical examination without abnormal findings 10/06/2018   Glaucoma 04/12/2018   Hearing loss 04/12/2018   History of urinary stone 04/12/2018   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   Osteoarthritis  04/12/2018   Pure hypercholesterolemia 04/12/2018   Left patella fracture 12/16/2017   CAD with elevated troponin 09/14/2016   Pacemaker 06/10/2016   SSS (sick sinus syndrome) (HCC) 06/07/2016   Paroxysmal atrial fibrillation (HCC) 06/07/2016   Sinus pause 06/07/2016   Bradycardia 06/04/2016   Groin pain 06/29/2014   Piriformis syndrome of left side 10/30/2013   PCP:  Tisovec, Charlie ORN, MD Pharmacy:   [facility] [address] [place]  [address] Phone: [phone] Fax: [phone]  [facility] [address] [place] [address] Phone: [phone] Fax: [phone]     Social Drivers of Health (SDOH) Social History: SDOH Screenings   Food Insecurity: No Food Insecurity (01/23/2024)  Housing: Low Risk (01/23/2024)  Transportation Needs: No Transportation Needs (01/23/2024)  Utilities: Not At Risk (01/23/2024)  Social Connections: Moderately Isolated (01/23/2024)  Stress: No Stress Concern Present (12/21/2023)   [facility] from [facility]  Tobacco Use: Medium Risk (01/23/2024)   SDOH Interventions:     Readmission Risk Interventions     No data to display

## 2024-01-25 NOTE — Plan of Care (Signed)
  Problem: Education: Goal: Ability to describe self-care measures that may prevent or decrease complications (Diabetes Survival Skills Education) will improve Outcome: Progressing Goal: Individualized Educational Video(s) Outcome: Progressing   Problem: Coping: Goal: Ability to adjust to condition or change in health will improve Outcome: Progressing   Problem: Fluid Volume: Goal: Ability to maintain a balanced intake and output will improve Outcome: Progressing   Problem: Nutritional: Goal: Maintenance of adequate nutrition will improve Outcome: Progressing Goal: Progress toward achieving an optimal weight will improve Outcome: Progressing

## 2024-01-25 NOTE — Progress Notes (Signed)
 Remote PPM Transmission

## 2024-01-25 NOTE — Progress Notes (Signed)
" °  Chaplain responded to [facility] consult for Advance Directives. Chaplain introduced spiritual care and offered support in the setting of inpatient admission.  Chaplain inquired about pt knowledge of Advance Directives and whether he would like further education and he declined. He politely stated, I've been asked that, actually several times, and I've said no. Chaplain apologized for the redundancy and inquired if pt would be okay with chaplain leaving the documents and education at his bedside, which he accepted.   Alan HERO. Davee Lomax, M.Div. [facility] Chaplain Pager [phone] Office [phone]    01/25/24 1116  Spiritual Encounters  Type of Visit Initial  Care provided to: Patient  Reason for visit Advance directives  Advance Directives (For Healthcare)  Does Patient Have a Medical Advance Directive? No  Would patient like information on creating a medical advance directive? No - Patient declined     "

## 2024-01-26 LAB — MAGNESIUM: Magnesium: 1.8 mg/dL (ref 1.7–2.4)

## 2024-01-26 LAB — GLUCOSE, CAPILLARY
Glucose-Capillary: 127 mg/dL — ABNORMAL HIGH (ref 70–99)
Glucose-Capillary: 209 mg/dL — ABNORMAL HIGH (ref 70–99)
Glucose-Capillary: 135 mg/dL — ABNORMAL HIGH (ref 70–99)

## 2024-01-26 LAB — COMPREHENSIVE METABOLIC PANEL WITH GFR
ALT: 20 U/L (ref 0–44)
AST: 19 U/L (ref 15–41)
Albumin: 2.9 g/dL — ABNORMAL LOW (ref 3.5–5.0)
Alkaline Phosphatase: 71 U/L (ref 38–126)
Anion gap: 9 (ref 5–15)
BUN: 22 mg/dL (ref 8–23)
CO2: 24 mmol/L (ref 22–32)
Calcium: 9.3 mg/dL (ref 8.9–10.3)
Chloride: 108 mmol/L (ref 98–111)
Creatinine, Ser: 0.92 mg/dL (ref 0.61–1.24)
GFR, Estimated: >60 mL/min
Glucose, Bld: 139 mg/dL — ABNORMAL HIGH (ref 70–99)
Potassium: 4 mmol/L (ref 3.5–5.1)
Sodium: 140 mmol/L (ref 135–145)
Total Bilirubin: 0.3 mg/dL (ref 0.0–1.2)
Total Protein: 5.2 g/dL — ABNORMAL LOW (ref 6.5–8.1)

## 2024-01-26 LAB — CBC WITH DIFFERENTIAL/PLATELET
Abs Immature Granulocytes: 0.02 K/uL (ref 0.00–0.07)
Basophils Absolute: 0 K/uL (ref 0.0–0.1)
Basophils Relative: 0 %
Eosinophils Absolute: 0.1 K/uL (ref 0.0–0.5)
Eosinophils Relative: 2 %
HCT: 28.5 % — ABNORMAL LOW (ref 39.0–52.0)
Hemoglobin: 9.7 g/dL — ABNORMAL LOW (ref 13.0–17.0)
Immature Granulocytes: 0 %
Lymphocytes Relative: 19 %
Lymphs Abs: 1 K/uL (ref 0.7–4.0)
MCH: 32.6 pg (ref 26.0–34.0)
MCHC: 34 g/dL (ref 30.0–36.0)
MCV: 95.6 fL (ref 80.0–100.0)
Monocytes Absolute: 0.5 K/uL (ref 0.1–1.0)
Monocytes Relative: 9 %
Neutro Abs: 3.6 K/uL (ref 1.7–7.7)
Neutrophils Relative %: 70 %
Platelets: 367 K/uL (ref 150–400)
RBC: 2.98 MIL/uL — ABNORMAL LOW (ref 4.22–5.81)
RDW: 13.2 % (ref 11.5–15.5)
WBC: 5.3 K/uL (ref 4.0–10.5)
nRBC: 0 % (ref 0.0–0.2)

## 2024-01-26 LAB — C-REACTIVE PROTEIN: CRP: 3.3 mg/dL — ABNORMAL HIGH

## 2024-01-26 LAB — PHOSPHORUS: Phosphorus: 3 mg/dL (ref 2.5–4.6)

## 2024-01-26 MED ORDER — MELATONIN 3 MG PO TABS
3.0000 mg | ORAL_TABLET | Freq: Once | ORAL | Status: AC
  Administered 2024-01-26: 3 mg via ORAL
  Filled 2024-01-26: qty 1

## 2024-01-26 MED ORDER — ENSURE PLUS HIGH PROTEIN PO LIQD: 237.0000 mL | Freq: Two times a day (BID) | ORAL | Status: DC

## 2024-01-26 NOTE — Plan of Care (Signed)
  Problem: Education: Goal: Ability to describe self-care measures that may prevent or decrease complications (Diabetes Survival Skills Education) will improve Outcome: Progressing Goal: Individualized Educational Video(s) Outcome: Progressing   Problem: Nutritional: Goal: Maintenance of adequate nutrition will improve Outcome: Progressing Goal: Progress toward achieving an optimal weight will improve Outcome: Progressing   Problem: Education: Goal: Knowledge of General Education information will improve Description: Including pain rating scale, medication(s)/side effects and non-pharmacologic comfort measures Outcome: Progressing

## 2024-01-26 NOTE — TOC Progression Note (Signed)
 Transition of Care [facility]) - Progression Note    Patient Details  Name: Travis [name] MRN: [id number] Date of Birth: 20-May-1940  [facility]) CM/SW Contact  Sherline Clack, [place] Phone Number: 01/26/2024, 11:53 AM  Clinical Narrative:     CSW spoke with daughter over the phone regarding placement. Daughter requested patient referral be sent out to [facility] and [facility]. Referral was sent through the [facility]. CSW encouraged daughter to visit [place] and circle back with CSW. CSW informed daughter patient would complete qualifying inpatient stay tomorrow. CSW will continue to follow and update dc plan.   Expected Discharge Plan: Skilled Nursing Facility Barriers to Discharge: SNF Pending bed offer               Expected Discharge Plan and Services       Living arrangements for the past 2 months: Single Family Home                                       Social Drivers of Health (SDOH) Interventions SDOH Screenings   Food Insecurity: No Food Insecurity (01/23/2024)  Housing: Low Risk (01/23/2024)  Transportation Needs: No Transportation Needs (01/23/2024)  Utilities: Not At Risk (01/23/2024)  Social Connections: Moderately Isolated (01/23/2024)  Stress: No Stress Concern Present (12/21/2023)   Received from [facility]  Tobacco Use: Medium Risk (01/23/2024)    Readmission Risk Interventions     No data to display

## 2024-01-26 NOTE — Plan of Care (Signed)
" °  Problem: Education: Goal: Ability to describe self-care measures that may prevent or decrease complications (Diabetes Survival Skills Education) will improve Outcome: Progressing Goal: Individualized Educational Video(s) Outcome: Progressing   Problem: Coping: Goal: Ability to adjust to condition or change in health will improve Outcome: Progressing   Problem: Fluid Volume: Goal: Ability to maintain a balanced intake and output will improve Outcome: Progressing   Problem: Health Behavior/Discharge Planning: Goal: Ability to identify and utilize available resources and services will improve Outcome: Progressing Goal: Ability to manage health-related needs will improve Outcome: Progressing   Problem: Health Behavior/Discharge Planning: Goal: Ability to manage health-related needs will improve Outcome: Progressing   Problem: Clinical Measurements: Goal: Ability to maintain clinical measurements within normal limits will improve Outcome: Progressing Goal: Will remain free from infection Outcome: Progressing Goal: Diagnostic test results will improve Outcome: Progressing Goal: Respiratory complications will improve Outcome: Progressing Goal: Cardiovascular complication will be avoided Outcome: Progressing   Problem: Pain Managment: Goal: General experience of comfort will improve and/or be controlled Outcome: Progressing   Problem: Safety: Goal: Ability to remain free from injury will improve Outcome: Progressing   Problem: Skin Integrity: Goal: Risk for impaired skin integrity will decrease Outcome: Progressing   "

## 2024-01-26 NOTE — Progress Notes (Signed)
 " PROGRESS NOTE    [name]  FMW:[id number] DOB: 1940-09-30 DOA: 01/22/2024 PCP: [name], MD   Brief Narrative:  Travis [name] is a 84 y.o. male with medical history significant of HTN, HLD, SSS s/p PPM, T2DM, PAF on eliquis  who presented to ED for AMS after being found sitting on floor, covered in feces for unknown amount of time.    He was admitted for AMS.   Now A&O, back to baseline.  Working on SNF.  Neurology referral at discharge given his recent decline.   Assessment & Plan:   Principal Problem:   Acute encephalopathy Active Problems:   Rhabdomyolysis   CAD with elevated troponin   Persistent atrial fibrillation (HCC)   Benign essential HTN   Type 2 diabetes mellitus without complication, without long-term current use of insulin  (HCC)   Venous stasis   SSS (sick sinus syndrome) (HCC)   Chronic kidney disease, stage 3a (HCC)  Acute Metabolic Encephalopathy Found at home sitting in feces, naked.  Hallucinating.  Has declined since 10/2023 fall with R total hip replacement.  For years, his daughter has noticed that he's moving more slowly.  Shuffling gait.  1-2 hrs to get ready.  Everything taking a lot longer.  Cognitively she hadn't noticed any particular concerns. Suspect underlying cognitive impairment, dementia (parkinson's, lewy body?) Currently A&O, but question underlying chronic cognitive deficit (dementia?) -MRI brain unremarkable. B12 wnl, ammonia wnl, VBG without hypercarbia, TSH wnl B1, RPR non reactive, HIV non reactive Recent admission 12/24/2023 for encephalopathy - at that time his opiate medicines were discontinued. Previous hospitalist Dr. Perri discussed with daughter on 1/12, concern for dementia not generally an indication for inpatient neurology consult.  Typically is a outpatient neurology workup.  Holding off for now, plan for outpatient referral.    Elevated Inflammatory Marker CRP elevated, being treated for possible cellulitis L  toe wound, plain film without acute findings.  However on my examination, I did not find any evidence of cellulitis.  Discontinuing antibiotics today.   Bilateral LE Edema LE US  without DVT Echo with preserved EF.  Albumin 2.5.     Toe Wound Palpable DP pulses bilaterally No acute findings to L great toe on plain films, no signs of cellulitis clinically as well.   CAD Elevated Troponin Suspected demand ischemia No RWMA on echo  Hypertension BP in 150's today Doesn't seem to be taking any meds -started on low-dose amlodipine  by previous hospitalist.   Atrial Fibrillation SSS s/p PPM Eliquis  Pacemaker interrogation pending   Peripheral Neuropathy  B12 wnl TSH wnl, A1c 6.8 Sounds like he first noticed this after his fall? Will hold on gabapentin with concern for intermittent encephalopathy above, but if it's bothersome enough, consider low dose   Right Hip Replacement Deconditioning Living independently as of 2 weeks ago  In 2025 Has declined since his surgery Continue therapy Planning for SNF  GERD PPI   T2DM Hold metformin  SSI for now   Elevated CK mild  DVT prophylaxis: Place TED hose Start: 01/22/24 2249   Code Status: Full Code  Family Communication:  None present at bedside.  Plan of care discussed with patient in length and he/she verbalized understanding and agreed with it.  Status is: Inpatient Remains inpatient appropriate because: Medically stable, pending placement.   Estimated body mass index is 21.34 kg/m as calculated from the following:   Height as of this encounter: 5' 11 (1.803 m).   Weight as of this encounter: 69.4 kg.  Wound 01/22/24 1750 Pressure Injury Buttocks Left Stage 1 -  Intact skin with non-blanchable redness of a localized area usually over a bony prominence. (Active)   Nutritional Assessment: Body mass index is 21.34 kg/m.SABRA Seen by dietician.  I agree with the assessment and plan as outlined below: Nutrition Status:         . Skin Assessment: I have examined the patient's skin and I agree with the wound assessment as performed by the wound care RN as outlined below: Wound 01/22/24 1750 Pressure Injury Buttocks Left Stage 1 -  Intact skin with non-blanchable redness of a localized area usually over a bony prominence. (Active)    Consultants:  None  Procedures:  None  Antimicrobials:  Anti-infectives (From admission, onward)    Start     Dose/Rate Route Frequency Ordered Stop   01/23/24 1000  cefadroxil  (DURICEF) capsule 1,000 mg        1,000 mg Oral 2 times daily 01/23/24 0814 01/28/24 0959         Subjective: Patient seen and examined, he has no complaints at all.  Objective: Vitals:   01/25/24 1956 01/26/24 0023 01/26/24 0455 01/26/24 0753  BP: (!) 154/69 120/61 (!) 151/75 (!) 169/76  Pulse: 73 71 70 66  Resp: 16 18 16    Temp: 98.8 F (37.1 C) (!) 97.4 F (36.3 C) 99 F (37.2 C) 98.3 F (36.8 C)  TempSrc:    Oral  SpO2: 97% 94% 98% 98%  Weight:      Height:        Intake/Output Summary (Last 24 hours) at 01/26/2024 1018 Last data filed at 01/26/2024 0600 Gross per 24 hour  Intake --  Output 300 ml  Net -300 ml   Filed Weights   01/23/24 0900  Weight: 69.4 kg    Examination:  General exam: Appears calm and comfortable  Respiratory system: Clear to auscultation. Respiratory effort normal. Cardiovascular system: S1 & S2 heard, RRR. No JVD, murmurs, rubs, gallops or clicks. No pedal edema. Gastrointestinal system: Abdomen is nondistended, soft and nontender. No organomegaly or masses felt. Normal bowel sounds heard. Central nervous system: Alert and oriented. No focal neurological deficits. Extremities: Symmetric 5 x 5 power. Skin: No rashes, lesions or ulcers Psychiatry: Judgement and insight appear normal. Mood & affect appropriate.    Data Reviewed: I have personally reviewed following labs and imaging studies  CBC: Recent Labs  Lab 01/22/24 1750  01/23/24 0611 01/24/24 0432 01/25/24 0429 01/26/24 0210  WBC 9.8 6.2 4.8 4.7 5.3  NEUTROABS 8.5*  --   --  3.0 3.6  HGB 11.5* 10.0* 9.8* 10.1* 9.7*  HCT 34.3* 30.2* 29.3* 29.9* 28.5*  MCV 96.3 98.1 97.0 96.1 95.6  PLT 342 316 317 343 367   Basic Metabolic Panel: Recent Labs  Lab 01/22/24 1750 01/22/24 2249 01/23/24 0611 01/24/24 0432 01/25/24 0429 01/26/24 0210  NA 139  --  141 137 139 140  K 3.8  --  3.7 4.0 3.8 4.0  CL 103  --  107 106 107 108  CO2 22  --  23 21* 24 24  GLUCOSE 178*  --  116* 101* 114* 139*  BUN 24*  --  18 15 16 22   CREATININE 1.10  --  1.00 0.78 0.83 0.92  CALCIUM  10.4*  --  9.7 9.0 9.3 9.3  MG  --  1.5* 2.1 1.7 1.6* 1.8  PHOS  --  3.3  --   --  2.4* 3.0   GFR:  Estimated Creatinine Clearance: 58.7 mL/min (by C-G formula based on SCr of 0.92 mg/dL). Liver Function Tests: Recent Labs  Lab 01/22/24 1750 01/23/24 0611 01/24/24 0432 01/25/24 0429 01/26/24 0210  AST 58* 42* 37 26 19  ALT 26 21 22 23 20   ALKPHOS 103 77 74 74 71  BILITOT 1.3* 0.9 0.5 0.4 0.3  PROT 6.8 5.5* 5.1* 5.2* 5.2*  ALBUMIN 3.6 3.1* 2.6* 2.8* 2.9*   No results for input(s): LIPASE, AMYLASE in the last 168 hours. Recent Labs  Lab 01/23/24 0258  AMMONIA 32   Coagulation Profile: No results for input(s): INR, PROTIME in the last 168 hours. Cardiac Enzymes: Recent Labs  Lab 01/22/24 1750 01/23/24 0611 01/23/24 1027  CKTOTAL 997* 494* 439*   BNP (last 3 results) Recent Labs    01/22/24 2249 01/23/24 0611  PROBNP 1,829.0* 1,703.0*   HbA1C: Recent Labs    01/24/24 1518  HGBA1C 6.8*   CBG: Recent Labs  Lab 01/25/24 0803 01/25/24 1130 01/25/24 1612 01/25/24 2129 01/26/24 0756  GLUCAP 107* 185* 77 164* 127*   Lipid Profile: No results for input(s): CHOL, HDL, LDLCALC, TRIG, CHOLHDL, LDLDIRECT in the last 72 hours. Thyroid Function Tests: No results for input(s): TSH, T4TOTAL, FREET4, T3FREE, THYROIDAB in the last 72  hours. Anemia Panel: No results for input(s): VITAMINB12, FOLATE, FERRITIN, TIBC, IRON, RETICCTPCT in the last 72 hours. Sepsis Labs: Recent Labs  Lab 01/22/24 1750 01/22/24 2250 01/23/24 0611  PROCALCITON  --  0.18 0.17  LATICACIDVEN 1.9  --   --     Recent Results (from the past 240 hours)  Blood culture (routine x 2)     Status: None (Preliminary result)   Collection Time: 01/22/24  5:50 PM   Specimen: Right Antecubital; Blood  Result Value Ref Range Status   Specimen Description   Final    RIGHT ANTECUBITAL BLOOD Performed at [facility], [address], [place], [place] [address]    Special Requests   Final    BOTTLES DRAWN AEROBIC AND ANAEROBIC Blood Culture adequate volume Performed at [facility], [address], [place], [place] [address]    Culture   Final    NO GROWTH 4 DAYS Performed at [facility], [address] [place], [place] [address]    Report Status PENDING  Incomplete  Resp panel by RT-PCR (RSV, Flu A&B, Covid) Anterior Nasal Swab     Status: None   Collection Time: 01/22/24  7:02 PM   Specimen: Anterior Nasal Swab  Result Value Ref Range Status   SARS Coronavirus 2 by RT PCR NEGATIVE NEGATIVE Final    Comment: (NOTE) SARS-CoV-2 target nucleic acids are NOT DETECTED.  The SARS-CoV-2 RNA is generally detectable in upper respiratory specimens during the acute phase of infection. The lowest concentration of SARS-CoV-2 viral copies this assay can detect is 138 copies/mL. A negative result does not preclude SARS-Cov-2 infection and should not be used as the sole basis for treatment or other patient management decisions. A negative result may occur with  improper specimen collection/handling, submission of specimen other than nasopharyngeal swab, presence of viral mutation(s) within the areas targeted by this assay, and inadequate number of viral copies(<138 copies/mL). A negative result must be combined  with clinical observations, patient history, and epidemiological information. The expected result is Negative.  Fact Sheet for Patients:  bloggercourse.com  Fact Sheet for Healthcare Providers:  seriousbroker.it  This test is no t yet approved or cleared by  the [place]  FDA and  has been authorized for detection and/or diagnosis of SARS-CoV-2 by FDA under an Emergency Use Authorization (EUA). This EUA will remain  in effect (meaning this test can be used) for the duration of the COVID-19 declaration under Section 564(b)(1) of the Act, 21 U.S.C.section 360bbb-3(b)(1), unless the authorization is terminated  or revoked sooner.       Influenza A by PCR NEGATIVE NEGATIVE Final   Influenza B by PCR NEGATIVE NEGATIVE Final    Comment: (NOTE) The Xpert Xpress SARS-CoV-2/FLU/RSV plus assay is intended as an aid in the diagnosis of influenza from Nasopharyngeal swab specimens and should not be used as a sole basis for treatment. Nasal washings and aspirates are unacceptable for Xpert Xpress SARS-CoV-2/FLU/RSV testing.  Fact Sheet for Patients: bloggercourse.com  Fact Sheet for Healthcare Providers: seriousbroker.it  This test is not yet approved or cleared by the [place]  FDA and has been authorized for detection and/or diagnosis of SARS-CoV-2 by FDA under an Emergency Use Authorization (EUA). This EUA will remain in effect (meaning this test can be used) for the duration of the COVID-19 declaration under Section 564(b)(1) of the Act, 21 U.S.C. section 360bbb-3(b)(1), unless the authorization is terminated or revoked.     Resp Syncytial Virus by PCR NEGATIVE NEGATIVE Final    Comment: (NOTE) Fact Sheet for Patients: bloggercourse.com  Fact Sheet for Healthcare Providers: seriousbroker.it  This test is not yet approved  or cleared by the [place]  FDA and has been authorized for detection and/or diagnosis of SARS-CoV-2 by FDA under an Emergency Use Authorization (EUA). This EUA will remain in effect (meaning this test can be used) for the duration of the COVID-19 declaration under Section 564(b)(1) of the Act, 21 U.S.C. section 360bbb-3(b)(1), unless the authorization is terminated or revoked.  Performed at [facility], [address], [place], [place] [address]   Blood culture (routine x 2)     Status: None (Preliminary result)   Collection Time: 01/23/24 10:27 AM   Specimen: BLOOD LEFT ARM  Result Value Ref Range Status   Specimen Description BLOOD LEFT ARM  Final   Special Requests   Final    BOTTLES DRAWN AEROBIC AND ANAEROBIC Blood Culture adequate volume   Culture   Final    NO GROWTH 3 DAYS Performed at [facility], [address] [place], [place] [address]    Report Status PENDING  Incomplete     Radiology Studies: MR BRAIN WO CONTRAST Result Date: 01/24/2024 EXAM: MRI BRAIN WITHOUT CONTRAST 01/24/2024 12:52:54 PM TECHNIQUE: Multiplanar multisequence MRI of the head/brain was performed without the administration of intravenous contrast. COMPARISON: Head CT 01/22/2024. CLINICAL HISTORY: Mental status change, unknown cause. FINDINGS: BRAIN AND VENTRICLES: There is no evidence of an acute infarct, intracranial hemorrhage, mass, midline shift, hydrocephalus, or extra-axial fluid collection. Cerebral white matter T2 hyperintensities are nonspecific but compatible with mild chronic small vessel ischemic disease. Numerous small T2 hyperintensities in the basal ganglia bilaterally appeared to predominantly reflect dilated perivascular spaces, possibly with some interspersed chronic lacunar infarcts as well. There is moderate generalized cerebral atrophy. Small and poorly visualized/hypoplastic distal left vertebral artery. Other major intracranial arterial flow voids are  preserved. ORBITS: Right cataract extraction. SINUSES AND MASTOIDS: Trace right mastoid effusion. Minimal mucosal thickening in the paranasal sinuses. BONES AND SOFT TISSUES: Normal marrow signal. No significant soft tissue abnormality. IMPRESSION: 1. No acute intracranial abnormality. 2. Mild chronic small vessel ischemic disease. Electronically signed by: Dasie Hamburg  MD MD 01/24/2024 02:39 PM EST RP Workstation: [id number]    Scheduled Meds:  amLODipine   2.5 mg Oral Daily   apixaban   5 mg Oral BID   ascorbic acid   500 mg Oral Daily   cefadroxil   1,000 mg Oral BID   dorzolamide -timolol   1 drop Both Eyes BID   ezetimibe   10 mg Oral Daily   insulin  aspart  0-9 Units Subcutaneous TID WC   pantoprazole   40 mg Oral BID   Continuous Infusions:  lactated ringers  Stopped (01/22/24 2120)     LOS: 2 days   Fredia Skeeter, MD [facility]  01/26/2024, 10:18 AM   *Please note that this is a verbal dictation therefore any spelling or grammatical errors are due to the Dragon Medical One system interpretation.  Please page via Amion and do not message via secure chat for urgent patient care matters. Secure chat can be used for non urgent patient care matters.  How to contact the [facility] Attending or Consulting provider 7A - 7P or covering provider during after hours 7P -7A, for this patient?  Check the care team in [facility] and look for a) attending/consulting [facility] provider listed and b) the [facility] team listed. Page or secure chat 7A-7P. Log into [email or web address] and use [place]'s universal password to access. If you do not have the password, please contact the hospital operator. Locate the [facility] provider you are looking for under [facility] and page to a number that you can be directly reached. If you still have difficulty reaching the provider, please page the [facility] (Director on Call) for the Hospitalists listed on amion for assistance.  "

## 2024-01-27 LAB — VITAMIN B1: Vitamin B1 (Thiamine): 73.7 nmol/L (ref 66.5–200.0)

## 2024-01-27 LAB — CULTURE, BLOOD (ROUTINE X 2)
Culture: NO GROWTH
Special Requests: ADEQUATE

## 2024-01-27 LAB — GLUCOSE, CAPILLARY
Glucose-Capillary: 131 mg/dL — ABNORMAL HIGH (ref 70–99)
Glucose-Capillary: 138 mg/dL — ABNORMAL HIGH (ref 70–99)
Glucose-Capillary: 191 mg/dL — ABNORMAL HIGH (ref 70–99)

## 2024-01-27 MED ORDER — METHOCARBAMOL 500 MG PO TABS: 250.0000 mg | ORAL_TABLET | Freq: Four times a day (QID) | ORAL | 0 refills | Status: AC | PRN

## 2024-01-27 NOTE — TOC Transition Note (Signed)
 Transition of Care [facility]) - Discharge Note   Patient Details  Name: Travis [name] MRN: [id number] Date of Birth: [birth year]  [facility] CM/SW Contact:  Sherline Clack, LCSWA Phone Number: 01/27/2024, 1:17 PM   Clinical Narrative:     Patient will DC to: [facility] Anticipated DC date: 01/27/24  Family notified: Cynthia/daughter Transport by: [facility]   Per MD patient ready for DC to [facility]. RN to call report prior to discharge [phone], room 808p). RN, patient, patient's family, and facility notified of DC. Discharge Summary and FL2 sent to facility. DC packet on chart. Ambulance transport requested for patient.   CSW will sign off for now as social work intervention is no longer needed. Please consult us  again if new needs arise.    Final next level of care: Skilled Nursing Facility Barriers to Discharge: Barriers Resolved   Patient Goals and CMS Choice     Choice offered to / list presented to : Adult Children      Discharge Placement              Patient chooses bed at: [facility] Patient to be transferred to facility by: [facility] Name of family member notified: Cynthia/daughter Patient and family notified of of transfer: 01/27/24  Discharge Plan and Services Additional resources added to the After Visit Summary for                                       Social Drivers of Health (SDOH) Interventions SDOH Screenings   Food Insecurity: No Food Insecurity (01/23/2024)  Housing: Low Risk (01/23/2024)  Transportation Needs: No Transportation Needs (01/23/2024)  Utilities: Not At Risk (01/23/2024)  Social Connections: Moderately Isolated (01/23/2024)  Stress: No Stress Concern Present (12/21/2023)   Received from [facility]  Tobacco Use: Medium Risk (01/23/2024)     Readmission Risk Interventions     No data to display

## 2024-01-27 NOTE — Progress Notes (Signed)
 Physical Therapy Treatment Patient Details Name: Travis [name] MRN: [id number] DOB: [birth year] Today's Date: 01/27/2024   History of Present Illness 84 yo M adm 1/10 AMS found down on ground hallunicating. With currne workup for acute metabolic encephalopathy, PMH 10/25 fall in 10k race with R THA, sick sinus syndrome s/p PPM/paroxysmal A-fib on Eliquis , CAD, CKD stage IIIa, DM2, glaucoma, hearing loss, OA. `    PT Comments  Patient progressing some with ambulation distance in the room and less assist for sit to stand from taller surface.  Still high fall risk and limited ambulation due to weakness.  Patient remains appropriate for post-acute inpatient rehab prior to d/c home.    If plan is discharge home, recommend the following: A lot of help with walking and/or transfers;A lot of help with bathing/dressing/bathroom;Assistance with cooking/housework;Assist for transportation;Help with stairs or ramp for entrance   Can travel by private vehicle        Equipment Recommendations  None recommended by PT    Recommendations for Other Services       Precautions / Restrictions Precautions Precautions: Fall Recall of Precautions/Restrictions: Impaired     Mobility  Bed Mobility Overal bed mobility: Needs Assistance Bed Mobility: Supine to Sit     Supine to sit: Mod assist, HOB elevated, Used rails     General bed mobility comments: assist for moving legs off EOB, heavy lift for trunk and A for scooting to EOB    Transfers Overall transfer level: Needs assistance Equipment used: Rolling walker (2 wheels) Transfers: Sit to/from Stand Sit to Stand: Mod assist, From elevated surface, +2 safety/equipment           General transfer comment: up with lifting help to stand and cues for hand placement    Ambulation/Gait Ambulation/Gait assistance: Mod assist, +2 safety/equipment, Min assist Gait Distance (Feet): 10 Feet Assistive device: Rolling walker (2 wheels) Gait  Pattern/deviations: Step-to pattern, Decreased stride length, Shuffle, Trunk flexed       General Gait Details: assist for balance, cues for forward gaze, another persom for chair follow, slower and shorter steps with fatigue, A for moving walker around corner of EOB   Stairs             Wheelchair Mobility     Tilt Bed    Modified Rankin (Stroke Patients Only)       Balance Overall balance assessment: Needs assistance Sitting-balance support: Feet supported Sitting balance-Leahy Scale: Fair     Standing balance support: Bilateral upper extremity supported, Reliant on assistive device for balance Standing balance-Leahy Scale: Poor Standing balance comment: reliant on BUE support and external assist                            Communication Communication Communication: Impaired Factors Affecting Communication: Hearing impaired  Cognition Arousal: Alert Behavior During Therapy: WFL for tasks assessed/performed   PT - Cognitive impairments: No apparent impairments                                Cueing Cueing Techniques: Verbal cues  Exercises General Exercises - Lower Extremity Long Arc Quad: AROM, Both, 10 reps, Seated Hip Flexion/Marching: AAROM, AROM, 10 reps, Seated    General Comments General comments (skin integrity, edema, etc.): applied compression socks prior to standing, denies dizziness      Pertinent Vitals/Pain Pain Assessment Pain Assessment: Faces Faces Pain Scale:  Hurts little more Pain Location: heels of bil LE with donning compression socks Pain Descriptors / Indicators: Discomfort, Sore Pain Intervention(s): Monitored during session, Repositioned, Limited activity within patient's tolerance    Home Living                          Prior Function            PT Goals (current goals can now be found in the care plan section) Progress towards PT goals: Progressing toward goals    Frequency    Min  2X/week      PT Plan      Co-evaluation              AM-PAC PT 6 Clicks Mobility   Outcome Measure  Help needed turning from your back to your side while in a flat bed without using bedrails?: A Lot Help needed moving from lying on your back to sitting on the side of a flat bed without using bedrails?: A Lot Help needed moving to and from a bed to a chair (including a wheelchair)?: Total Help needed standing up from a chair using your arms (e.g., wheelchair or bedside chair)?: Total Help needed to walk in hospital room?: Total Help needed climbing 3-5 steps with a railing? : Total 6 Click Score: 8    End of Session Equipment Utilized During Treatment: Gait belt Activity Tolerance: Patient limited by fatigue Patient left: in chair;with chair alarm set;with nursing/sitter in room;with call bell/phone within reach Nurse Communication: Mobility status PT Visit Diagnosis: Other abnormalities of gait and mobility (R26.89);Muscle weakness (generalized) (M62.81)     Time: 1202-1225 PT Time Calculation (min) (ACUTE ONLY): 23 min  Charges:    $Gait Training: 8-22 mins $Therapeutic Activity: 8-22 mins PT General Charges $$ ACUTE PT VISIT: 1 Visit                     Micheline [name], PT Acute Rehabilitation Services Office:[phone] 01/27/2024    Travis [name] 01/27/2024, 4:02 PM

## 2024-01-27 NOTE — Care Management Important Message (Signed)
 Important Message  Patient Details  Name: Travis [name] MRN: [id number] Date of Birth: 1940/10/15   Important Message Given:  Yes - Medicare IM     Claretta Deed 01/27/2024, 3:14 PM

## 2024-01-27 NOTE — Progress Notes (Signed)
 Report was called to [facility]

## 2024-01-27 NOTE — Discharge Summary (Signed)
 Physician Discharge Summary  Travis [name] FMW:[id number] DOB: May 26, 1940 DOA: 01/22/2024  PCP: Tisovec, Richard W, MD  Admit date: 01/22/2024 Discharge date: 01/27/2024 30 Day Unplanned Readmission Risk Score    Flowsheet Row ED to Hosp-Admission (Current) from 01/22/2024 in [facility]  30 Day Unplanned Readmission Risk Score (%) 18.04 Filed at 01/27/2024 0801    This score is the patient's risk of an unplanned readmission within 30 days of being discharged (0 -100%). The score is based on dignosis, age, lab data, medications, orders, and past utilization.   Low:  0-14.9   Medium: 15-21.9   High: 22-29.9   Extreme: 30 and above          Admitted From: Home Disposition: SNF-[facility]  Recommendations for Outpatient Follow-up:  Follow up with PCP in 1-2 weeks Please obtain BMP/CBC in one week Please follow up with your PCP on the following pending results: Unresulted Labs (From admission, onward)     Start     Ordered   01/25/24 0500  CBC with Differential/Platelet  Tomorrow morning,   R        01/24/24 1515   01/24/24 1429  Protein / creatinine ratio, urine  Once,   R        01/24/24 1428   01/24/24 1418  Vitamin B1  Once,   R        01/24/24 1417              Home Health: None Equipment/Devices: None  Discharge Condition: Stable CODE STATUS: Full code Diet recommendation:  Diet Order             Diet Carb Modified Room service appropriate? Yes  Diet effective now                   Subjective: Seen and examined.  Doing well.  No complaints.  He is looking forward to discharge to SNF today.  Brief/Interim Summary: Travis [name] is a 84 y.o. male with medical history significant of HTN, HLD, SSS s/p PPM, T2DM, PAF on eliquis  who presented to ED for AMS after being found sitting on floor, covered in feces for unknown amount of time.  Upon arrival to ED, he was fairly hemodynamically stable.  He was admitted for acute encephalopathy.   Details below.   Acute Metabolic Encephalopathy Found at home sitting in feces, naked.  Hallucinating.  Has declined since 10/2023 fall with R total hip replacement.  For years, his daughter has noticed that he's moving more slowly.  Shuffling gait.  1-2 hrs to get ready.  Everything taking a lot longer.  Cognitively she hadn't noticed any particular concerns. Suspect underlying cognitive impairment, dementia (parkinson's, lewy body?) Currently A&O, but question underlying chronic cognitive deficit (dementia?) -MRI brain unremarkable. B12 wnl, ammonia wnl, VBG without hypercarbia, TSH wnl B1, RPR non reactive, HIV non reactive Recent admission 12/24/2023 for encephalopathy - at that time his opiate medicines were discontinued. Previous hospitalist Dr. Perri discussed with daughter on 1/12, concern for dementia not generally an indication for inpatient neurology consult.  Typically is a outpatient neurology workup.  Recommend follow-up with neurology as outpatient.  Elevated Inflammatory Marker: Has bilateral pressure ulcers in the heels, old left great toe wound which does not have any signs of cellulitis or any pain or tenderness, plain film without acute findings. CRP elevated, was being treated for possible cellulitis by previous hospitalist.  However on my examination, I did not find  any evidence of cellulitis.  Patient received antibiotics for almost 4 days, I discontinued them on 01/26/2024.   Bilateral LE Edema LE US  without DVT Echo with preserved EF.  Albumin 2.5.     CAD Elevated Troponin Suspected demand ischemia No RWMA on echo  Hypertension BP in 150's today Doesn't seem to be taking any meds -started on low-dose amlodipine  2.5 mg by previous hospitalist, blood pressure still elevated, discharging on 5 mg of amlodipine .   Atrial Fibrillation SSS s/p PPM Eliquis    Right Hip Replacement/deconditioning: Living independently as of 2 weeks ago, has declined since surgery.   Seen by PT OT, SNF recommended.  He is being discharged to SNF today.  GERD: PPI   T2DM: Resume metformin .  Discharge Diagnoses:  Principal Problem:   Acute encephalopathy Active Problems:   Rhabdomyolysis   CAD with elevated troponin   Persistent atrial fibrillation (HCC)   Benign essential HTN   Type 2 diabetes mellitus without complication, without long-term current use of insulin  (HCC)   Venous stasis   SSS (sick sinus syndrome) (HCC)   Chronic kidney disease, stage 3a [facility])    Discharge Instructions  Discharge Instructions     Ambulatory referral to Neurology   Complete by: As directed    An appointment is requested in approximately: 4 weeks      Allergies as of 01/27/2024   No Known Allergies      Medication List     STOP taking these medications    diclofenac  Sodium 1 % Gel Commonly known as: VOLTAREN    hydrALAZINE  10 MG tablet Commonly known as: APRESOLINE    HYDROcodone -acetaminophen  5-325 MG tablet Commonly known as: NORCO/VICODIN   lisinopril  20 MG tablet Commonly known as: ZESTRIL    senna-docusate 8.6-50 MG tablet Commonly known as: Senokot-S   Zinc  Sulfate 220 (50 Zn) MG Tabs       TAKE these medications    acetaminophen  325 MG tablet Commonly known as: TYLENOL  Take 2 tablets (650 mg total) by mouth every 6 (six) hours as needed for mild pain (pain score 1-3).   amLODipine  5 MG tablet Commonly known as: NORVASC  Take 1 tablet (5 mg total) by mouth daily.   ascorbic acid  500 MG tablet Commonly known as: VITAMIN C  Take 1 tablet (500 mg total) by mouth daily.   atorvastatin  40 MG tablet Commonly known as: LIPITOR Take 1 tablet (40 mg total) by mouth daily.   brimonidine -timolol  0.2-0.5 % ophthalmic solution Commonly known as: COMBIGAN  Place 1 drop into both eyes 2 (two) times daily.   brimonidine -timolol  0.2-0.5 % ophthalmic solution Commonly known as: COMBIGAN  Place 1 drop into both eyes 2 (two) times daily.    clotrimazole -betamethasone  cream Commonly known as: LOTRISONE  Apply to the affected area(s) topically 2 (two) times daily. What changed: Another medication with the same name was removed. Continue taking this medication, and follow the directions you see here.   Eliquis  5 MG Tabs tablet Generic drug: apixaban  Take 1 tablet (5 mg total) by mouth 2 (two) times daily.   ezetimibe  10 MG tablet Commonly known as: ZETIA  Take 1 tablet (10 mg total) by mouth daily.   melatonin 3 MG Tabs tablet Commonly known as: FT Melatonin Take 1 tablet (3 mg total) by mouth at bedtime.   metFORMIN  500 MG tablet Commonly known as: GLUCOPHAGE  Take 2 tablets (1,000 mg total) by mouth 2 (two) times daily. What changed: Another medication with the same name was removed. Continue taking this medication, and follow the directions  you see here.   methocarbamol  500 MG tablet Commonly known as: ROBAXIN  Take 0.5 tablets (250 mg total) by mouth 4 (four) times daily as needed.   pantoprazole  40 MG tablet Commonly known as: PROTONIX  Take 1 tablet (40 mg total) by mouth 2 (two) times daily before meals.   PEG 3350  17 GM/SCOOP Powd Take 17 g by mouth daily as needed.   Santyl  250 UNIT/GM ointment Generic drug: collagenase  Apply topically daily. Apply to affected areas as directed   vitamin D3 25 MCG tablet Commonly known as: CHOLECALCIFEROL  Take 1 tablet (1,000 Units total) by mouth daily.        Follow-up Information     Tisovec, Charlie ORN, MD Follow up in 1 week(s).   Specialty: Internal Medicine Contact information: [address] [place] [address] [phone]                Allergies[1]  Consultations: None   Procedures/Studies: CUP PACEART REMOTE DEVICE CHECK Result Date: 01/24/2024 Pacemaker: Scheduled remote reviewed. Normal device function.  Presenting rhythm: AP/VS 6 NSVT, 4 show 1:1 conduction, 1 NSVT, 1 VT monitor only zone, V>A, >20 beats (01/03/2024) V-rate 182  bpm, sent to triage 1 Fast A&V, 1:1 conduction, V-rate 171 bpm  Next remote transmission per protocol. ML, CVRS  MR BRAIN WO CONTRAST Result Date: 01/24/2024 EXAM: MRI BRAIN WITHOUT CONTRAST 01/24/2024 12:52:54 PM TECHNIQUE: Multiplanar multisequence MRI of the head/brain was performed without the administration of intravenous contrast. COMPARISON: Head CT 01/22/2024. CLINICAL HISTORY: Mental status change, unknown cause. FINDINGS: BRAIN AND VENTRICLES: There is no evidence of an acute infarct, intracranial hemorrhage, mass, midline shift, hydrocephalus, or extra-axial fluid collection. Cerebral white matter T2 hyperintensities are nonspecific but compatible with mild chronic small vessel ischemic disease. Numerous small T2 hyperintensities in the basal ganglia bilaterally appeared to predominantly reflect dilated perivascular spaces, possibly with some interspersed chronic lacunar infarcts as well. There is moderate generalized cerebral atrophy. Small and poorly visualized/hypoplastic distal left vertebral artery. Other major intracranial arterial flow voids are preserved. ORBITS: Right cataract extraction. SINUSES AND MASTOIDS: Trace right mastoid effusion. Minimal mucosal thickening in the paranasal sinuses. BONES AND SOFT TISSUES: Normal marrow signal. No significant soft tissue abnormality. IMPRESSION: 1. No acute intracranial abnormality. 2. Mild chronic small vessel ischemic disease. Electronically signed by: Dasie Hamburg MD MD 01/24/2024 02:39 PM EST RP Workstation: [id number]   VAS US  LOWER EXTREMITY VENOUS (DVT) Result Date: 01/24/2024  Lower Venous DVT Study Patient Name:  LATASHA PUSKAS  Date of Exam:   01/23/2024 Medical Rec #: [id number]      Accession #:    [id number] Date of Birth: [birth year]      Patient Gender: M Patient Age:   59 years Exam Location:  [facility] Procedure:      VAS US  LOWER EXTREMITY VENOUS (DVT) Referring Phys: RAMESH KC  --------------------------------------------------------------------------------  Indications: Swelling, and Edema.  Anticoagulation: Eliquis . Comparison Study: 10/21/23 - Negative Performing Technologist: Ricka Sturdivant-Jones RDMS, RVT  Examination Guidelines: A complete evaluation includes B-mode imaging, spectral Doppler, color Doppler, and power Doppler as needed of all accessible portions of each vessel. Bilateral testing is considered an integral part of a complete examination. Limited examinations for reoccurring indications may be performed as noted. The reflux portion of the exam is performed with the patient in reverse Trendelenburg.  +---------+---------------+---------+-----------+----------+--------------+ RIGHT    CompressibilityPhasicitySpontaneityPropertiesThrombus Aging +---------+---------------+---------+-----------+----------+--------------+ CFV      Full           Yes  Yes                                 +---------+---------------+---------+-----------+----------+--------------+ SFJ      Full                                                        +---------+---------------+---------+-----------+----------+--------------+ FV Prox  Full                                                        +---------+---------------+---------+-----------+----------+--------------+ FV Mid   Full           Yes      Yes                                 +---------+---------------+---------+-----------+----------+--------------+ FV DistalFull                                                        +---------+---------------+---------+-----------+----------+--------------+ PFV      Full                                                        +---------+---------------+---------+-----------+----------+--------------+ POP      Full           Yes      Yes                                 +---------+---------------+---------+-----------+----------+--------------+ PTV       Full                                                        +---------+---------------+---------+-----------+----------+--------------+ PERO     Full                                                        +---------+---------------+---------+-----------+----------+--------------+   +---------+---------------+---------+-----------+----------+--------------+ LEFT     CompressibilityPhasicitySpontaneityPropertiesThrombus Aging +---------+---------------+---------+-----------+----------+--------------+ CFV      Full           Yes      Yes                                 +---------+---------------+---------+-----------+----------+--------------+ SFJ      Full                                                        +---------+---------------+---------+-----------+----------+--------------+  FV Prox  Full                                                        +---------+---------------+---------+-----------+----------+--------------+ FV Mid   Full           Yes      Yes                                 +---------+---------------+---------+-----------+----------+--------------+ FV DistalFull                                                        +---------+---------------+---------+-----------+----------+--------------+ PFV      Full                                                        +---------+---------------+---------+-----------+----------+--------------+ POP      Full           Yes      Yes                                 +---------+---------------+---------+-----------+----------+--------------+ PTV      Full                                                        +---------+---------------+---------+-----------+----------+--------------+ PERO     Full                                                        +---------+---------------+---------+-----------+----------+--------------+     Summary: BILATERAL: - No evidence of deep vein  thrombosis seen in the lower extremities, bilaterally. -No evidence of popliteal cyst, bilaterally.   *See table(s) above for measurements and observations. Electronically signed by Gaile New MD on 01/24/2024 at 8:34:19 AM.    Final    ECHOCARDIOGRAM COMPLETE Result Date: 01/23/2024    ECHOCARDIOGRAM REPORT   Patient Name:   FARHAD BURLESON Date of Exam: 01/23/2024 Medical Rec #:  [id number]     Height:       71.0 in Accession #:    [id number]    Weight:       150.0 lb Date of Birth:  [birth year]     BSA:          1.866 m Patient Age:    83 years      BP:           154/81 mmHg Patient Gender: M             HR:  73 bpm. Exam Location:  Inpatient Procedure: 2D Echo (Both Spectral and Color Flow Doppler were utilized during            procedure). Indications:    elevated troponin  History:        Patient has prior history of Echocardiogram examinations, most                 recent 12/26/2021. CAD, Pacemaker, chronic kidney disease,                 Arrythmias:Paroxysmal A-fib; Risk Factors:Hypertension and                 Diabetes.  Sonographer:    Tinnie Barefoot RDCS Referring Phys: [id number] ALLISON WOLFE IMPRESSIONS  1. Left ventricular ejection fraction, by estimation, is 55 to 60%. Left ventricular ejection fraction by 2D MOD biplane is 58.6 %. The left ventricle has normal function. The left ventricle has no regional wall motion abnormalities. Left ventricular diastolic parameters were normal.  2. Lead in RV. Right ventricular systolic function is normal. The right ventricular size is normal. There is normal pulmonary artery systolic pressure.  3. Left atrial size was mildly dilated.  4. Lead in RA.  5. The mitral valve is normal in structure. Mild mitral valve regurgitation. No evidence of mitral stenosis.  6. The aortic valve is normal in structure. Aortic valve regurgitation is not visualized. No aortic stenosis is present.  7. The inferior vena cava is normal in size with <50% respiratory  variability, suggesting right atrial pressure of 8 mmHg. Comparison(s): No significant change from prior study. FINDINGS  Left Ventricle: Left ventricular ejection fraction, by estimation, is 55 to 60%. Left ventricular ejection fraction by 2D MOD biplane is 58.6 %. The left ventricle has normal function. The left ventricle has no regional wall motion abnormalities. The left ventricular internal cavity size was normal in size. There is no left ventricular hypertrophy. Left ventricular diastolic parameters were normal. Right Ventricle: Lead in RV. The right ventricular size is normal. No increase in right ventricular wall thickness. Right ventricular systolic function is normal. There is normal pulmonary artery systolic pressure. The tricuspid regurgitant velocity is 2.65 m/s, and with an assumed right atrial pressure of 3 mmHg, the estimated right ventricular systolic pressure is 31.1 mmHg. Left Atrium: Left atrial size was mildly dilated. Right Atrium: Lead in RA. Right atrial size was normal in size. Pericardium: There is no evidence of pericardial effusion. Mitral Valve: The mitral valve is normal in structure. Mild mitral valve regurgitation. No evidence of mitral valve stenosis. Tricuspid Valve: The tricuspid valve is normal in structure. Tricuspid valve regurgitation is mild . No evidence of tricuspid stenosis. Aortic Valve: The aortic valve is normal in structure. Aortic valve regurgitation is not visualized. No aortic stenosis is present. Pulmonic Valve: The pulmonic valve was normal in structure. Pulmonic valve regurgitation is not visualized. No evidence of pulmonic stenosis. Aorta: The aortic root is normal in size and structure. Venous: The inferior vena cava is normal in size with less than 50% respiratory variability, suggesting right atrial pressure of 8 mmHg. IAS/Shunts: No atrial level shunt detected by color flow Doppler.  LEFT VENTRICLE PLAX 2D                        Biplane EF (MOD) LVIDd:          4.40 cm         LV Biplane EF:  Left LVIDs:         3.10 cm                          ventricular LV PW:         1.00 cm                          ejection LV IVS:        1.00 cm                          fraction by LVOT diam:     1.90 cm                          2D MOD LV SV:         44                               biplane is LV SV Index:   23                               58.6 %. LVOT Area:     2.84 cm LV IVRT:       130 msec        Diastology                                LV e' medial:    6.53 cm/s                                LV E/e' medial:  12.2 LV Volumes (MOD)               LV e' lateral:   7.18 cm/s LV vol d, MOD    56.9 ml       LV E/e' lateral: 11.1 A2C: LV vol d, MOD    75.2 ml A4C: LV vol s, MOD    22.2 ml A2C: LV vol s, MOD    34.2 ml A4C: LV SV MOD A2C:   34.7 ml LV SV MOD A4C:   75.2 ml LV SV MOD BP:    38.8 ml RIGHT VENTRICLE             IVC RV Basal diam:  2.80 cm     IVC diam: 1.60 cm RV S prime:     17.60 cm/s TAPSE (M-mode): 1.8 cm      PULMONARY VEINS                             Diastolic Velocity: 54.40 cm/s                             S/D Velocity:       1.50                             Systolic Velocity:  80.60 cm/s LEFT ATRIUM             Index  RIGHT ATRIUM           Index LA diam:        3.80 cm 2.04 cm/m   RA Area:     15.90 cm LA Vol (A2C):   56.0 ml 30.01 ml/m  RA Volume:   40.40 ml  21.65 ml/m LA Vol (A4C):   72.0 ml 38.59 ml/m LA Biplane Vol: 63.5 ml 34.03 ml/m  AORTIC VALVE LVOT Vmax:   76.40 cm/s LVOT Vmean:  49.400 cm/s LVOT VTI:    0.154 m  AORTA Ao Root diam: 3.20 cm MITRAL VALVE               TRICUSPID VALVE MV Area (PHT): 4.68 cm    TR Peak grad:   28.1 mmHg MV Decel Time: 162 msec    TR Vmax:        265.00 cm/s MR Peak grad: 117.9 mmHg MR Vmax:      543.00 cm/s  SHUNTS MV E velocity: 79.80 cm/s  Systemic VTI:  0.15 m MV A velocity: 64.30 cm/s  Systemic Diam: 1.90 cm MV E/A ratio:  1.24 Franck Azobou Tonleu Electronically signed by Joelle Cedars Tonleu  Signature Date/Time: 01/23/2024/4:10:59 PM    Final    DG Pelvis 1-2 Views Result Date: 01/23/2024 CLINICAL DATA:  Pain. EXAM: PELVIS - 1-2 VIEW COMPARISON:  10/31/2023 FINDINGS: No evidence for an acute fracture. SI joints and symphysis pubis unremarkable. Status post right total hip replacement. IMPRESSION: 1. No acute bony findings. 2. Status post right total hip replacement. Electronically Signed   By: Camellia Candle M.D.   On: 01/23/2024 09:15   DG Toe Great Left Result Date: 01/22/2024 EXAM: 3 VIEW(S) XRAY OF THE LEFT TOES 01/22/2024 11:15:00 PM COMPARISON: None available. CLINICAL HISTORY: Pain FINDINGS: BONES AND JOINTS: No acute fracture. No malalignment. SOFT TISSUES: Soft tissue edema. IMPRESSION: 1. No acute findings. Electronically signed by: Pinkie Pebbles MD MD 01/22/2024 11:32 PM EST RP Workstation: [id number]   CT Head Wo Contrast Result Date: 01/22/2024 EXAM: CT HEAD WITHOUT CONTRAST 01/22/2024 06:34:20 PM TECHNIQUE: CT of the head was performed without the administration of intravenous contrast. Automated exposure control, iterative reconstruction, and/or weight based adjustment of the mA/kV was utilized to reduce the radiation dose to as low as reasonably achievable. COMPARISON: None available. CLINICAL HISTORY: Head trauma, minor (Age >= 65y); Mental status change, unknown cause. FINDINGS: BRAIN AND VENTRICLES: No acute hemorrhage. No evidence of acute infarct. No hydrocephalus. No extra-axial collection. No mass effect or midline shift. Parenchymal volume loss is commensurate with the patient's age. Periventricular white matter changes are present likely reflecting the sequela of small vessel ischemia. ORBITS: No acute abnormality. SINUSES: No acute abnormality. SOFT TISSUES AND SKULL: No acute soft tissue abnormality. No skull fracture. IMPRESSION: 1. No acute intracranial abnormality. 2. Parenchymal volume loss commensurate with the patient's age. 3. Periventricular white matter  changes likely reflecting the sequela of small vessel ischemia. Electronically signed by: Dorethia Molt MD MD 01/22/2024 06:39 PM EST RP Workstation: [id number]   DG Chest 2 View Result Date: 01/22/2024 EXAM: 2 VIEW(S) XRAY OF THE CHEST 01/22/2024 06:27:51 PM COMPARISON: 10/30/2023 CLINICAL HISTORY: AMS (altered mental status) FINDINGS: LINES, TUBES AND DEVICES: Left subclavian cardiac rhythm maintenance device stable in position. LUNGS AND PLEURA: No focal pulmonary opacity. No pleural effusion. No pneumothorax. HEART AND MEDIASTINUM: Aortic atherosclerosis. Moderate hiatal hernia. No acute abnormality of the cardiac and mediastinal silhouettes. BONES AND SOFT TISSUES: Exaggerated thoracic kyphosis. No acute osseous abnormality. IMPRESSION:  1. No acute cardiopulmonary findings. 2. Stable left subclavian cardiac rhythm maintenance device. 3. Aortic atherosclerosis. 4. Moderate hiatal hernia. 5. Exaggerated thoracic kyphosis. Electronically signed by: Dorethia Molt MD MD 01/22/2024 06:36 PM EST RP Workstation: [id number]     Discharge Exam: Vitals:   01/27/24 0028 01/27/24 0741  BP: (!) 152/74 (!) 164/94  Pulse: 65 68  Resp: 20 18  Temp: 97.8 F (36.6 C) 98 F (36.7 C)  SpO2: 98% 99%   Vitals:   01/26/24 1600 01/26/24 1957 01/27/24 0028 01/27/24 0741  BP: (!) 148/72 (!) 142/64 (!) 152/74 (!) 164/94  Pulse: 68 70 65 68  Resp:  20 20 18   Temp: 98.2 F (36.8 C) 98.4 F (36.9 C) 97.8 F (36.6 C) 98 F (36.7 C)  TempSrc: Oral Oral Oral Oral  SpO2: 99% 97% 98% 99%  Weight:      Height:        General: Pt is alert, awake, not in acute distress Cardiovascular: RRR, S1/S2 +, no rubs, no gallops Respiratory: CTA bilaterally, no wheezing, no rhonchi Abdominal: Soft, NT, ND, bowel sounds + Extremities: no edema, no cyanosis    The results of significant diagnostics from this hospitalization (including imaging, microbiology, ancillary and laboratory) are listed below for reference.      Microbiology: Recent Results (from the past 240 hours)  Blood culture (routine x 2)     Status: None   Collection Time: 01/22/24  5:50 PM   Specimen: Right Antecubital; Blood  Result Value Ref Range Status   Specimen Description   Final    RIGHT ANTECUBITAL BLOOD Performed at [facility], [address] [place], [place] [address]    Special Requests   Final    BOTTLES DRAWN AEROBIC AND ANAEROBIC Blood Culture adequate volume Performed at [facility], [address] [place], [place] [address]    Culture   Final    NO GROWTH 5 DAYS Performed at [facility], [address], [place], [place] [address]    Report Status 01/27/2024 FINAL  Final  Resp panel by RT-PCR (RSV, Flu A&B, Covid) Anterior Nasal Swab     Status: None   Collection Time: 01/22/24  7:02 PM   Specimen: Anterior Nasal Swab  Result Value Ref Range Status   SARS Coronavirus 2 by RT PCR NEGATIVE NEGATIVE Final    Comment: (NOTE) SARS-CoV-2 target nucleic acids are NOT DETECTED.  The SARS-CoV-2 RNA is generally detectable in upper respiratory specimens during the acute phase of infection. The lowest concentration of SARS-CoV-2 viral copies this assay can detect is 138 copies/mL. A negative result does not preclude SARS-Cov-2 infection and should not be used as the sole basis for treatment or other patient management decisions. A negative result may occur with  improper specimen collection/handling, submission of specimen other than nasopharyngeal swab, presence of viral mutation(s) within the areas targeted by this assay, and inadequate number of viral copies(<138 copies/mL). A negative result must be combined with clinical observations, patient history, and epidemiological information. The expected result is Negative.  Fact Sheet for Patients:  bloggercourse.com  Fact Sheet for Healthcare Providers:  seriousbroker.it  This  test is no t yet approved or cleared by the [place]  FDA and  has been authorized for detection and/or diagnosis of SARS-CoV-2 by FDA under an Emergency Use Authorization (EUA). This EUA will remain  in effect (meaning this test can be used) for the duration of the COVID-19 declaration under Section 564(b)(1)  of the Act, 21 U.S.C.section 360bbb-3(b)(1), unless the authorization is terminated  or revoked sooner.       Influenza A by PCR NEGATIVE NEGATIVE Final   Influenza B by PCR NEGATIVE NEGATIVE Final    Comment: (NOTE) The Xpert Xpress SARS-CoV-2/FLU/RSV plus assay is intended as an aid in the diagnosis of influenza from Nasopharyngeal swab specimens and should not be used as a sole basis for treatment. Nasal washings and aspirates are unacceptable for Xpert Xpress SARS-CoV-2/FLU/RSV testing.  Fact Sheet for Patients: bloggercourse.com  Fact Sheet for Healthcare Providers: seriousbroker.it  This test is not yet approved or cleared by the [place]  FDA and has been authorized for detection and/or diagnosis of SARS-CoV-2 by FDA under an Emergency Use Authorization (EUA). This EUA will remain in effect (meaning this test can be used) for the duration of the COVID-19 declaration under Section 564(b)(1) of the Act, 21 U.S.C. section 360bbb-3(b)(1), unless the authorization is terminated or revoked.     Resp Syncytial Virus by PCR NEGATIVE NEGATIVE Final    Comment: (NOTE) Fact Sheet for Patients: bloggercourse.com  Fact Sheet for Healthcare Providers: seriousbroker.it  This test is not yet approved or cleared by the [place]  FDA and has been authorized for detection and/or diagnosis of SARS-CoV-2 by FDA under an Emergency Use Authorization (EUA). This EUA will remain in effect (meaning this test can be used) for the duration of the COVID-19 declaration under  Section 564(b)(1) of the Act, 21 U.S.C. section 360bbb-3(b)(1), unless the authorization is terminated or revoked.  Performed at [facility], [address] [place], [place] [address]   Blood culture (routine x 2)     Status: None (Preliminary result)   Collection Time: 01/23/24 10:27 AM   Specimen: BLOOD LEFT ARM  Result Value Ref Range Status   Specimen Description BLOOD LEFT ARM  Final   Special Requests   Final    BOTTLES DRAWN AEROBIC AND ANAEROBIC Blood Culture adequate volume   Culture   Final    NO GROWTH 4 DAYS Performed at [facility], [address] [place], [place] [address]    Report Status PENDING  Incomplete     Labs: BNP (last 3 results) No results for input(s): BNP in the last 8760 hours. Basic Metabolic Panel: Recent Labs  Lab 01/22/24 1750 01/22/24 2249 01/23/24 0611 01/24/24 0432 01/25/24 0429 01/26/24 0210  NA 139  --  141 137 139 140  K 3.8  --  3.7 4.0 3.8 4.0  CL 103  --  107 106 107 108  CO2 22  --  23 21* 24 24  GLUCOSE 178*  --  116* 101* 114* 139*  BUN 24*  --  18 15 16 22   CREATININE 1.10  --  1.00 0.78 0.83 0.92  CALCIUM  10.4*  --  9.7 9.0 9.3 9.3  MG  --  1.5* 2.1 1.7 1.6* 1.8  PHOS  --  3.3  --   --  2.4* 3.0   Liver Function Tests: Recent Labs  Lab 01/22/24 1750 01/23/24 0611 01/24/24 0432 01/25/24 0429 01/26/24 0210  AST 58* 42* 37 26 19  ALT 26 21 22 23 20   ALKPHOS 103 77 74 74 71  BILITOT 1.3* 0.9 0.5 0.4 0.3  PROT 6.8 5.5* 5.1* 5.2* 5.2*  ALBUMIN 3.6 3.1* 2.6* 2.8* 2.9*   No results for input(s): LIPASE, AMYLASE in the last 168 hours. Recent Labs  Lab 01/23/24 0258  AMMONIA 32  CBC: Recent Labs  Lab 01/22/24 1750 01/23/24 0611 01/24/24 0432 01/25/24 0429 01/26/24 0210  WBC 9.8 6.2 4.8 4.7 5.3  NEUTROABS 8.5*  --   --  3.0 3.6  HGB 11.5* 10.0* 9.8* 10.1* 9.7*  HCT 34.3* 30.2* 29.3* 29.9* 28.5*  MCV 96.3 98.1 97.0 96.1 95.6  PLT 342 316 317 343 367   Cardiac  Enzymes: Recent Labs  Lab 01/22/24 1750 01/23/24 0611 01/23/24 1027  CKTOTAL 997* 494* 439*   BNP: Invalid input(s): POCBNP CBG: Recent Labs  Lab 01/26/24 0756 01/26/24 1159 01/26/24 1601 01/27/24 0029 01/27/24 0741  GLUCAP 127* 209* 135* 138* 131*   D-Dimer No results for input(s): DDIMER in the last 72 hours. Hgb A1c Recent Labs    01/24/24 1518  HGBA1C 6.8*   Lipid Profile No results for input(s): CHOL, HDL, LDLCALC, TRIG, CHOLHDL, LDLDIRECT in the last 72 hours. Thyroid function studies No results for input(s): TSH, T4TOTAL, T3FREE, THYROIDAB in the last 72 hours.  Invalid input(s): FREET3 Anemia work up No results for input(s): VITAMINB12, FOLATE, FERRITIN, TIBC, IRON, RETICCTPCT in the last 72 hours. Urinalysis    Component Value Date/Time   COLORURINE YELLOW 01/22/2024 1752   APPEARANCEUR CLEAR 01/22/2024 1752   LABSPEC 1.017 01/22/2024 1752   PHURINE 5.0 01/22/2024 1752   GLUCOSEU NEGATIVE 01/22/2024 1752   HGBUR MODERATE (A) 01/22/2024 1752   BILIRUBINUR NEGATIVE 01/22/2024 1752   KETONESUR 20 (A) 01/22/2024 1752   PROTEINUR 100 (A) 01/22/2024 1752   NITRITE NEGATIVE 01/22/2024 1752   LEUKOCYTESUR NEGATIVE 01/22/2024 1752   Sepsis Labs Recent Labs  Lab 01/23/24 0611 01/24/24 0432 01/25/24 0429 01/26/24 0210  WBC 6.2 4.8 4.7 5.3   Microbiology Recent Results (from the past 240 hours)  Blood culture (routine x 2)     Status: None   Collection Time: 01/22/24  5:50 PM   Specimen: Right Antecubital; Blood  Result Value Ref Range Status   Specimen Description   Final    RIGHT ANTECUBITAL BLOOD Performed at [facility], [address], [place], [place] [address]    Special Requests   Final    BOTTLES DRAWN AEROBIC AND ANAEROBIC Blood Culture adequate volume Performed at [facility], [address] [place], [place] [address]    Culture   Final    NO GROWTH 5 DAYS Performed at  [facility], [address] [place], [place] [address]    Report Status 01/27/2024 FINAL  Final  Resp panel by RT-PCR (RSV, Flu A&B, Covid) Anterior Nasal Swab     Status: None   Collection Time: 01/22/24  7:02 PM   Specimen: Anterior Nasal Swab  Result Value Ref Range Status   SARS Coronavirus 2 by RT PCR NEGATIVE NEGATIVE Final    Comment: (NOTE) SARS-CoV-2 target nucleic acids are NOT DETECTED.  The SARS-CoV-2 RNA is generally detectable in upper respiratory specimens during the acute phase of infection. The lowest concentration of SARS-CoV-2 viral copies this assay can detect is 138 copies/mL. A negative result does not preclude SARS-Cov-2 infection and should not be used as the sole basis for treatment or other patient management decisions. A negative result may occur with  improper specimen collection/handling, submission of specimen other than nasopharyngeal swab, presence of viral mutation(s) within the areas targeted by this assay, and inadequate number of viral copies(<138 copies/mL). A negative result must be combined with clinical observations, patient history, and epidemiological information. The expected result is Negative.  Fact Sheet  for Patients:  bloggercourse.com  Fact Sheet for Healthcare Providers:  seriousbroker.it  This test is no t yet approved or cleared by the [place]  FDA and  has been authorized for detection and/or diagnosis of SARS-CoV-2 by FDA under an Emergency Use Authorization (EUA). This EUA will remain  in effect (meaning this test can be used) for the duration of the COVID-19 declaration under Section 564(b)(1) of the Act, 21 U.S.C.section 360bbb-3(b)(1), unless the authorization is terminated  or revoked sooner.       Influenza A by PCR NEGATIVE NEGATIVE Final   Influenza B by PCR NEGATIVE NEGATIVE Final    Comment: (NOTE) The Xpert Xpress SARS-CoV-2/FLU/RSV plus assay is  intended as an aid in the diagnosis of influenza from Nasopharyngeal swab specimens and should not be used as a sole basis for treatment. Nasal washings and aspirates are unacceptable for Xpert Xpress SARS-CoV-2/FLU/RSV testing.  Fact Sheet for Patients: bloggercourse.com  Fact Sheet for Healthcare Providers: seriousbroker.it  This test is not yet approved or cleared by the [place]  FDA and has been authorized for detection and/or diagnosis of SARS-CoV-2 by FDA under an Emergency Use Authorization (EUA). This EUA will remain in effect (meaning this test can be used) for the duration of the COVID-19 declaration under Section 564(b)(1) of the Act, 21 U.S.C. section 360bbb-3(b)(1), unless the authorization is terminated or revoked.     Resp Syncytial Virus by PCR NEGATIVE NEGATIVE Final    Comment: (NOTE) Fact Sheet for Patients: bloggercourse.com  Fact Sheet for Healthcare Providers: seriousbroker.it  This test is not yet approved or cleared by the [place]  FDA and has been authorized for detection and/or diagnosis of SARS-CoV-2 by FDA under an Emergency Use Authorization (EUA). This EUA will remain in effect (meaning this test can be used) for the duration of the COVID-19 declaration under Section 564(b)(1) of the Act, 21 U.S.C. section 360bbb-3(b)(1), unless the authorization is terminated or revoked.  Performed at [facility], [address] [place], [place] [address]   Blood culture (routine x 2)     Status: None (Preliminary result)   Collection Time: 01/23/24 10:27 AM   Specimen: BLOOD LEFT ARM  Result Value Ref Range Status   Specimen Description BLOOD LEFT ARM  Final   Special Requests   Final    BOTTLES DRAWN AEROBIC AND ANAEROBIC Blood Culture adequate volume   Culture   Final    NO GROWTH 4 DAYS Performed at [facility],  [address] [place], [place] [address]    Report Status PENDING  Incomplete    FURTHER DISCHARGE INSTRUCTIONS:   Get Medicines reviewed and adjusted: Please take all your medications with you for your next visit with your Primary MD   Laboratory/radiological data: Please request your Primary MD to go over all hospital tests and procedure/radiological results at the follow up, please ask your Primary MD to get all Hospital records sent to his/her office.   In some cases, they will be blood work, cultures and biopsy results pending at the time of your discharge. Please request that your primary care M.D. goes through all the records of your hospital data and follows up on these results.   Also Note the following: If you experience worsening of your admission symptoms, develop shortness of breath, life threatening emergency, suicidal or homicidal thoughts you must seek medical attention immediately by calling 911 or calling your MD immediately  if symptoms less severe.   You must read complete  instructions/literature along with all the possible adverse reactions/side effects for all the Medicines you take and that have been prescribed to you. Take any new Medicines after you have completely understood and accpet all the possible adverse reactions/side effects.    patient was instructed, not to drive, operate heavy machinery, perform activities at heights, swimming or participation in water activities or provide baby-sitting services while on Pain, Sleep and Anxiety Medications; until their outpatient Physician has advised to do so again. Also recommended to not to take more than prescribed Pain, Sleep and Anxiety Medications.  It is not advisable to combine anxiety, sleep and pain medications without talking with your primary care provider.     Wear Seat belts while driving.   Please note: You were cared for by a hospitalist during your hospital stay. Once you are discharged, your primary care  physician will handle any further medical issues. Please note that NO REFILLS for any discharge medications will be authorized once you are discharged, as it is imperative that you return to your primary care physician (or establish a relationship with a primary care physician if you do not have one) for your post hospital discharge needs so that they can reassess your need for medications and monitor your lab values  Time coordinating discharge: Over 30 minutes  SIGNED:   Fredia Skeeter, MD  [facility] 01/27/2024, 11:33 AM *Please note that this is a verbal dictation therefore any spelling or grammatical errors are due to the Dragon Medical One system interpretation. If 7PM-7AM, please contact night-coverage [email or web address]     [1] No Known Allergies

## 2024-01-28 ENCOUNTER — Telehealth: Payer: Self-pay | Admitting: Emergency Medicine

## 2024-01-28 LAB — CULTURE, BLOOD (ROUTINE X 2)
Culture: NO GROWTH
Special Requests: ADEQUATE

## 2024-01-28 NOTE — Telephone Encounter (Signed)
 [name], [name], MD  Travis Comer CROME, Travis [name]; [place], [place] M Can we please book him a slot on Thursday 29th for a device check and Hospital follow up. Thank you.  02/10/24 at 10:40  Tried the pt's cell phone and it went straight to voicemail. Called Travis [name] [facility]) She confirmed the appointment. Informed of location and asked to please fill out/update DPR when registering for appointment. She verbalized understanding of all information.

## 2024-01-31 ENCOUNTER — Encounter: Payer: Self-pay | Admitting: Physician Assistant

## 2024-02-02 ENCOUNTER — Ambulatory Visit: Admitting: Podiatry

## 2024-02-02 DIAGNOSIS — L89890 Pressure ulcer of other site, unstageable: Secondary | ICD-10-CM

## 2024-02-02 DIAGNOSIS — E1140 Type 2 diabetes mellitus with diabetic neuropathy, unspecified: Secondary | ICD-10-CM

## 2024-02-02 DIAGNOSIS — B351 Tinea unguium: Secondary | ICD-10-CM

## 2024-02-02 DIAGNOSIS — E1149 Type 2 diabetes mellitus with other diabetic neurological complication: Secondary | ICD-10-CM

## 2024-02-02 DIAGNOSIS — M79674 Pain in right toe(s): Secondary | ICD-10-CM | POA: Diagnosis not present

## 2024-02-02 DIAGNOSIS — M79675 Pain in left toe(s): Secondary | ICD-10-CM

## 2024-02-02 NOTE — Progress Notes (Signed)
"  °  Subjective:  Patient ID: Travis [name], male    DOB: 12-Dec-1940,  MRN: [id number]  Travis [name] presents to clinic today for preventative diabetic foot care for painful thick toenails that are difficult to trim. Pain interferes with ambulation. Aggravating factors include wearing enclosed shoe gear. Pain is relieved with periodic professional debridement.  Patient ran a 10K in October and sustained a fall fracturing his right hip at the finish line. He has been in rehab for the past several months. He is a current resident at [facility] and [address].  New problem(s): None.   PCP is Tisovec, Charlie ORN, MD.  Allergies[1]  Review of Systems: Negative except as noted in the HPI.  Objective:  There were no vitals filed for this visit. Travis [name] is a pleasant 84 y.o. male WD, WN in NAD. AAO x 3.  Normal sensation of 10g monofilament Intact posterior tibialis and dorsalis pedis pulses Vascular Examination: Capillary refill time immediate b/l. Palpable pedal pulses. Pedal hair sparse b/l. No pain with calf compression b/l. Skin temperature gradient WNL b/l. No cyanosis or clubbing b/l. No ischemia or gangrene noted b/l.   Neurological Examination: Pt has subjective symptoms of neuropathy. Sensation grossly intact b/l with 10 gram monofilament. Vibratory sensation intact b/l.   Dermatological Examination:     Dime sized unstageable pressure injury distal tip of left great toe. Stable, no erythema, no edema, no drainage, no fluctuance, no ischemia, no gangrene.   No open wounds. No interdigital macerations.   Toenails 1-5 b/l thick, discolored, elongated with subungual debris and pain on dorsal palpation.   Hyperkeratotic lesion(s) submet head 2 b/l and submet head 3 b/l.  No erythema, no edema, no drainage, no fluctuance.  Musculoskeletal Examination: Muscle strength 5/5 to all lower extremity muscle groups bilaterally. Pes planus deformity noted bilateral LE.SABRA No pain, crepitus  or joint limitation noted with ROM b/l LE.  Patient ambulates independently without assistive aids.  Radiographs: None  Assessment/Plan: 1. Pain due to onychomycosis of toenails of both feet   2. Pressure injury of toe of left foot, unstageable (HCC)   3. Diabetic neuropathy with neurologic complication [facility])   Patient was evaluated and treated. All patient's and/or POA's questions/concerns addressed on today's visit. Toenails 1-5 b/l debrided in length and girth without incident. Continue foot and shoe inspections daily. Monitor blood glucose per PCP/Endocrinologist's recommendations. Continue soft, supportive shoe gear daily. Report any pedal injuries to medical professional. Call office if there are any questions/concerns. -Order written for facility to consider open toe compression hose and cover toes with diabetic socks. -Patient/POA to call should there be question/concern in the interim.   Return in about 3 months (around 05/02/2024).  Travis [name], DPM      [facility] LOCATION: [address], [place] [address]                   Office [phone]   [facility] LOCATION: [address] [place], [place] [address] Office [phone]      [1] No Known Allergies  "

## 2024-02-07 ENCOUNTER — Encounter: Payer: Self-pay | Admitting: Podiatry

## 2024-02-10 ENCOUNTER — Encounter: Payer: Self-pay | Admitting: Cardiovascular Disease

## 2024-02-10 ENCOUNTER — Ambulatory Visit: Attending: Cardiology | Admitting: Cardiovascular Disease

## 2024-02-10 VITALS — BP 134/72 | HR 75 | Ht 71.0 in

## 2024-02-10 DIAGNOSIS — I2510 Atherosclerotic heart disease of native coronary artery without angina pectoris: Secondary | ICD-10-CM | POA: Insufficient documentation

## 2024-02-10 DIAGNOSIS — I4729 Other ventricular tachycardia: Secondary | ICD-10-CM | POA: Insufficient documentation

## 2024-02-10 DIAGNOSIS — E119 Type 2 diabetes mellitus without complications: Secondary | ICD-10-CM | POA: Diagnosis present

## 2024-02-10 DIAGNOSIS — I358 Other nonrheumatic aortic valve disorders: Secondary | ICD-10-CM | POA: Diagnosis present

## 2024-02-10 DIAGNOSIS — E7800 Pure hypercholesterolemia, unspecified: Secondary | ICD-10-CM | POA: Insufficient documentation

## 2024-02-10 DIAGNOSIS — F03A0 Unspecified dementia, mild, without behavioral disturbance, psychotic disturbance, mood disturbance, and anxiety: Secondary | ICD-10-CM | POA: Insufficient documentation

## 2024-02-10 DIAGNOSIS — I495 Sick sinus syndrome: Secondary | ICD-10-CM | POA: Diagnosis present

## 2024-02-10 DIAGNOSIS — I4819 Other persistent atrial fibrillation: Secondary | ICD-10-CM | POA: Insufficient documentation

## 2024-02-10 DIAGNOSIS — D6869 Other thrombophilia: Secondary | ICD-10-CM | POA: Insufficient documentation

## 2024-02-10 DIAGNOSIS — I10 Essential (primary) hypertension: Secondary | ICD-10-CM | POA: Insufficient documentation

## 2024-02-10 DIAGNOSIS — Z950 Presence of cardiac pacemaker: Secondary | ICD-10-CM | POA: Insufficient documentation

## 2024-02-10 NOTE — Patient Instructions (Signed)
 Medication Instructions:  Stop Atorvastatin  *If you need a refill on your cardiac medications before your next appointment, please call your pharmacy*  Lab Work: None ordered If you have labs (blood work) drawn today and your tests are completely normal, you will receive your results only by: MyChart Message (if you have MyChart) OR A paper copy in the mail If you have any lab test that is abnormal or we need to change your treatment, we will call you to review the results.  Testing/Procedures: None ordered  Follow-Up: At [facility], you and your health needs are our priority.  As part of our continuing mission to provide you with exceptional heart care, our providers are all part of one team.  This team includes your primary Cardiologist (physician) and Advanced Practice Providers or APPs (Physician Assistants and Nurse Practitioners) who all work together to provide you with the care you need, when you need it.  Your next appointment:   3-4  month(s)  Provider:   Dr Francyne  We recommend signing up for the patient portal called MyChart.  Sign up information is provided on this After Visit Summary.  MyChart is used to connect with patients for Virtual Visits (Telemedicine).  Patients are able to view lab/test results, encounter notes, upcoming appointments, etc.  Non-urgent messages can be sent to your provider as well.   To learn more about what you can do with MyChart, go to [email or web address].

## 2024-02-10 NOTE — Progress Notes (Signed)
 " Cardiology Office Note:    Date:  02/10/2024   ID:  Travis [name], DOB Jan 25, 1940, MRN [id number]  PCP:  [name], MD  Cardiologist: Burnard Ned M.D.; [name] Balding, MD    Referring MD: [name], MD  Chief Complaint  Patient presents with   Atrial Fibrillation    History of Present Illness:    Travis [name] is a 84 y.o. male with a hx of hypertension and hyperlipidemia, well-controlled DM type II, moderate CAD, mild aortic valve stenosis, severe sinus bradycardia, periods of junctional escape rhythm and sinus pauses of up to 6 seconds in duration on an event monitor, for which he received a dual-chamber pacemaker in 2018 (Medtronic MRI conditional).  He underwent cardioversion for persistent atrial fibrillation (almost 3 month duration; unaware of palpitations, but associated with reduced stamina, arrhythmia detected by his pacemaker) in September 2025 and has been on chronic anticoagulation since.  Travis [name] has had a substantial decline since his last office visit.  He was living independently (widower) and driving on a regular basis about a year ago.  For several years he has described a fear of falling, although these continue to run a 10K race at least at least once a year.  Over the last he year he has had gradually worsening shuffling gait and he has stopped running.  Although he continued to live independently, his daughter Travis [name] has noticed that it took him longer and longer to be prepared for any appointments and he would get very easily sidetracked.  He has not had shortness of breath, chest pain, syncope, edema or palpitations.  In October 2025, when he was getting close to completion of a 10K race he fell and was found to have a right femoral neck fracture.  He underwent right total hip arthroplasty 10/31/2023.  He had mild postoperative anemia, down to a hemoglobin of 9.7.  During that hospitalization telemetry showed a 23 beat run of nonsustained VT, but  pacemaker interrogation did not show any events around the time of his fall.  Just a couple of days after hospital discharge he was admitted in [place] with hypotension and acute kidney injury (creatinine 2.5 compared to discharge 1.5) due to poor p.o. intake, but also as a drop in hemoglobin to 7.8.  He was then treated at [facility] from 11/09/2023 until 11/19/2023.  Creatinine returned to baseline.  He did have some problems with some coccygeal skin breakdown.  Roughly a month later he was again admitted to the [facility] facility in [place] due to generalized weakness and acute metabolic encephalopathy causing secondary myopathy.  There was no evidence of stroke or major lab abnormalities.  It was felt that opiates were contributing to his deterioration.  He was noticed that he was fully alert in the day but getting confused at night and in the early morning.  He was discharged in improved status on 12/24/2023.  On 01/22/2024 he was once again hospitalized with altered mental status after being found at home covered in feces.  He was hallucinating, thought people trying to break into his house.  There were no major metabolic or head imaging abnormalities and he continues to have fluctuating mental status in and out.  He is now in the [facility] for rehab.  He is here in a wheelchair, but he is able to walk with a rolling walker.  He feels that he has improved physically, but is still quite weak.  He acknowledges  that the evenings are difficult and he gets confused.  This is also confirmed by his daughter Travis [name].  Travis [name] and his daughter both realize that he will not be able to return to living independently.  They are uncertain whether they will have in-home care or some other type of living arrangements at this point.  Interrogation of his pacemaker does not show any new episodes of atrial fibrillation since the cardioversion September 2025.  There have also  been no episodes of high ventricular rate.  Device function is normal and estimated generator longevity is 5.8 years.  Lead parameters are also normal.  He has 97.3 percent atrial pacing and only 0.5% ventricular pacing.  No reprogramming changes were necessary.  Underlying rhythm is severe sinus bradycardia at about 35 bpm, but he is not technically pacemaker dependent.  His blood pressure is normal today.  Metabolic parameters are fair.  Hemoglobin A1c is up at 6.8% in diabetes range, but he is only taking metformin  monotherapy for diabetes.  His lipid parameters are excellent with an HDL of 62, LDL 51 and triglycerides of 58.  He is taking atorvastatin  40 mg daily and has been on this medication for at least the last 8 years.  He is also on ezetimibe .  He underwent elective implantation of a dual-chamber Medtronic Azure XT permanent pacemaker on 06/10/2016. He had coronary angiography the same day which showed 65% mid LAD, 70% second diagonal, 30-40%stenoses in the mid circumflex.  He has mild aortic valve stenosis by an echo performed in May 2020.  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   SSS (sick sinus syndrome) (HCC) 06/10/2016   Type II diabetes mellitus (HCC)     Past Surgical History:  Procedure Laterality Date   APPENDECTOMY     CARDIAC CATHETERIZATION  06/10/2016   CARDIOVERSION N/A 10/01/2023   Procedure: CARDIOVERSION;  Surgeon: Francyne Headland, MD;  Location: [facility];  Service: Cardiovascular;  Laterality: N/A;   CATARACT EXTRACTION W/ INTRAOCULAR LENS IMPLANT Right    CYSTOSCOPY W/ STONE MANIPULATION     INSERT / REPLACE / REMOVE PACEMAKER  06/10/2016   dual chamber permanent pacemaker   KNEE ARTHROSCOPY W/  MENISCECTOMY Bilateral    LAPAROSCOPIC CHOLECYSTECTOMY     LEFT HEART CATH AND CORONARY ANGIOGRAPHY N/A 06/10/2016   Procedure: Left Heart Cath and Coronary Angiography;  Surgeon: Cherrie Toribio SAUNDERS, MD;  Location: [facility];  Service: Cardiovascular;  Laterality: N/A;   LITHOTRIPSY     PACEMAKER IMPLANT N/A 06/10/2016   Procedure: Pacemaker Implant;  Surgeon: Francyne Headland, MD;  Location: [facility];  Service: Cardiovascular;  Laterality: N/A;   TONSILLECTOMY     TOTAL HIP ARTHROPLASTY Right 10/31/2023   Procedure: RIGHT TOTAL HIP ARTHROPLASTY,  ANTERIOR APPROACH;  Surgeon: Ernie Cough, MD;  Location: [facility] OR;  Service: Orthopedics;  Laterality: Right;    Current Medications: Current Meds  Medication Sig   acetaminophen  (TYLENOL ) 325 MG tablet Take 2 tablets (650 mg total) by mouth every 6 (six) hours as needed for mild pain (pain score 1-3).   amLODipine  (NORVASC ) 5 MG tablet Take 1 tablet (5 mg total) by mouth daily.   apixaban  (ELIQUIS )  5 MG TABS tablet Take 1 tablet (5 mg total) by mouth 2 (two) times daily.   ascorbic acid  (VITAMIN C ) 500 MG tablet Take 1 tablet (500 mg total) by mouth daily.   atorvastatin  (LIPITOR) 40 MG tablet Take 1 tablet (40 mg total) by mouth daily.   brimonidine -timolol  (COMBIGAN ) 0.2-0.5 % ophthalmic solution Place 1 drop into both eyes 2 (two) times daily.   brimonidine -timolol  (COMBIGAN ) 0.2-0.5 % ophthalmic solution Place 1 drop into both eyes 2 (two) times daily.   clotrimazole -betamethasone  (LOTRISONE ) cream Apply to the affected area(s) topically 2 (two) times daily.   collagenase  (SANTYL ) 250 UNIT/GM ointment Apply topically daily. Apply to affected areas as directed   ezetimibe  (ZETIA ) 10 MG tablet Take 1 tablet (10 mg total) by mouth daily.   melatonin (FT MELATONIN) 3 MG TABS tablet Take 1 tablet (3 mg total) by mouth at bedtime.   metFORMIN  (GLUCOPHAGE ) 1000 MG tablet Take 1,000 mg by mouth 2 (two) times daily.   methocarbamol   (ROBAXIN ) 500 MG tablet Take 0.5 tablets (250 mg total) by mouth 4 (four) times daily as needed.   pantoprazole  (PROTONIX ) 40 MG tablet Take 1 tablet (40 mg total) by mouth 2 (two) times daily before meals.   vitamin D3 (CHOLECALCIFEROL ) 25 MCG tablet Take 1 tablet (1,000 Units total) by mouth daily.     Allergies:   Patient has no known allergies.       Family History: The patient's family history includes Colon cancer in his father. ROS:   Please see the history of present illness.    All other systems are reviewed and are negative.   EKGs/Labs/Other Studies Reviewed:    The following studies were reviewed today: Full pacemaker check performed today 01/15/2022  01/23/2024 Echocardiogram    1. Left ventricular ejection fraction, by estimation, is 55 to 60%. Left  ventricular ejection fraction by 2D MOD biplane is 58.6 %. The left  ventricle has normal function. The left ventricle has no regional wall  motion abnormalities. Left ventricular  diastolic parameters were normal.   2. Lead in RV. Right ventricular systolic function is normal. The right  ventricular size is normal. There is normal pulmonary artery systolic  pressure.   3. Left atrial size was mildly dilated.   4. Lead in RA.   5. The mitral valve is normal in structure. Mild mitral valve  regurgitation. No evidence of mitral stenosis.   6. The aortic valve is normal in structure. Aortic valve regurgitation is  not visualized. No aortic stenosis is present.   7. The inferior vena cava is normal in size with <50% respiratory  variability, suggesting right atrial pressure of 8 mmHg.   Comparison(s): No significant change from prior study.   EKG:  Personally reviewed ECG Dated 01/22/2024 which shows atrial paced rhythm.  The leads are inverted and there is artifactual Q wave pattern in leads I and aVL.  No true ischemic changes are present.  EKG Interpretation Date/Time:    Ventricular Rate:    PR Interval:     QRS Duration:    QT Interval:    QTC Calculation:   R Axis:      Text Interpretation:           Recent Labs:  01/22/2024: TSH 1.750 01/23/2024: Pro Brain Natriuretic Peptide 1,703.0 01/26/2024: ALT 20; BUN 22; Creatinine, Ser 0.92; Hemoglobin 9.7; Magnesium  1.8; Platelets 367; Potassium 4.0; Sodium 140  10/09/2019 Hemoglobin 14.1, creatinine 1.3, TSH 3.1, hemoglobin A1c 6.6% Total cholesterol  110, HDL 52, LDL 50, triglycerides 82, APO B 59  10/23/2020 Cholesterol 103, HDL 46, LDL 46, triglycerides 53 potassium 4.5, normal liver function tests, hemoglobin A1c 6.2%  11/21/2021 Cholesterol 114, HDL 54, LDL 47, triglycerides 63 Hemoglobin A1c 6.3% Potassium 4.8, ALT 23  Physical Exam:    VS:  BP 134/72 (BP Location: Left Arm, Patient Position: Sitting, Cuff Size: Small)   Pulse 75   Ht 5' 11 (1.803 m)   SpO2 99%   BMI 21.34 kg/m     Wt Readings from Last 3 Encounters:  01/23/24 153 lb (69.4 kg)  11/09/23 150 lb (68 kg)  10/30/23 150 lb (68 kg)     General: Alert, oriented x3, no distress, in the past he looks much younger than his stated age, but he now looks like an octogenarian.  He has lost a lot of weight and appears rather frail.  Healthy left subclavian pacemaker site. Head: no evidence of trauma, PERRL, EOMI, no exophtalmos or lid lag, no myxedema, no xanthelasma; normal ears, nose and oropharynx Neck: normal jugular venous pulsations and no hepatojugular reflux; brisk carotid pulses without delay and no carotid bruits Chest: clear to auscultation, no signs of consolidation by percussion or palpation, normal fremitus, symmetrical and full respiratory excursions Cardiovascular: normal position and quality of the apical impulse, regular rhythm, normal first and second heart sounds, 2/6 aortic ejection murmur, no diastolic murmurs, rubs or gallops Abdomen: no tenderness or distention, no masses by palpation, no abnormal pulsatility or arterial bruits, normal bowel  sounds, no hepatosplenomegaly Extremities: no clubbing, cyanosis or edema; 2+ radial, ulnar and brachial pulses bilaterally; 2+ right femoral, posterior tibial and dorsalis pedis pulses; 2+ left femoral, posterior tibial and dorsalis pedis pulses; no subclavian or femoral bruits Neurological: grossly nonfocal Psych: Normal mood and affect    ASSESSMENT:    1. Persistent atrial fibrillation (HCC)   2. Acquired thrombophilia   3. SSS (sick sinus syndrome) (HCC)   4. Pacemaker   5. Coronary artery disease involving native coronary artery of native heart without angina pectoris   6. Aortic valve sclerosis   7. Essential hypertension   8. NSVT (nonsustained ventricular tachycardia) (HCC)   9. Pure hypercholesterolemia   10. Type 2 diabetes mellitus without complication, without long-term current use of insulin  (HCC)   11. Mild dementia without behavioral disturbance, psychotic disturbance, mood disturbance, or anxiety, unspecified dementia type (HCC)         PLAN:    In order of problems listed above:  Atypical atrial flutter/persistent atrial fibrillation: He has had some episodes of paroxysmal atypical atrial flutter and then was in persistent atrial fibrillation throughout most of June-July - September 2025 until he underwent a cardioversion.  He has not had recurrent atrial fibrillation since then, although he has brief bursts of paroxysmal atrial tachycardia.   Anticoagulation: He is on appropriate anticoagulation with Eliquis  and I think this should be continued as long as his risk of falls of injury allows it.  CHA2DS2-VASc score 4 (age 59, DM, HTN).  He has never had a stroke or TIA. SSS: His fall was not related to bradycardia or other arrhythmia.  Current sensor settings appear to be appropriate. Pacemaker: He is not device dependent.  Has underlying marked sinus bradycardia.  Normal device function.  Continue remote downloads every 3 months. CAD: Has not complained of angina  pectoris.  He only had moderate nonobstructive disease at angiography in 2018.  The focus is on risk factor modification. AS:  His most recent echocardiogram did not show aortic stenosis but only aortic valve sclerosis. HTN: Very well-controlled typically, blood pressure is just slightly high today.  No changes were made on his medications will continue to monitor. NSVT: Infrequent, usually very brief, asymptomatic.  During his hospitalization with his hip fracture he did have a 23 beat event, likewise asymptomatic. HLP: All lipid parameters in target range.  Plan a 22-month suspension of statin therapy to see if this is associated with any cognitive improvement.  Continue ezetimibe . DM: Adequate glycemic control on metformin  monotherapy. Dementia: He has cognitive deficits that wax and wane in severity, with good days and bad days and with typical worsening in a pattern of sundowning.  Will discontinue his statin for several months to see if this leads to any improvement.   I agree that he can no longer live independently.  There has been a substantial decrement in his physical abilities, even though he has been very physically fit his whole life.  This raises concern for Lewy body dementia. I spoke with his daughter Travis [name] and she has managed to schedule an earlier neurology appointment in early to mid February.  He might benefit from cholinesterase inhibitors and we do not need to worry about potential bradycardia since he has a pacemaker.     Medication Adjustments/Labs and Tests Ordered: Current medicines are reviewed at length with the patient today.  Concerns regarding medicines are outlined above. Labs and tests ordered and medication changes are outlined in the patient instructions below:  Patient Instructions  Medication Instructions:  Stop Atorvastatin  *If you need a refill on your cardiac medications before your next appointment, please call your pharmacy*  Lab Work: None ordered If  you have labs (blood work) drawn today and your tests are completely normal, you will receive your results only by: MyChart Message (if you have MyChart) OR A paper copy in the mail If you have any lab test that is abnormal or we need to change your treatment, we will call you to review the results.  Testing/Procedures: None ordered  Follow-Up: At [facility], you and your health needs are our priority.  As part of our continuing mission to provide you with exceptional heart care, our providers are all part of one team.  This team includes your primary Cardiologist (physician) and Advanced Practice Providers or APPs (Physician Assistants and Nurse Practitioners) who all work together to provide you with the care you need, when you need it.  Your next appointment:   3-4  month(s)  Provider:   Dr Francyne  We recommend signing up for the patient portal called MyChart.  Sign up information is provided on this After Visit Summary.  MyChart is used to connect with patients for Virtual Visits (Telemedicine).  Patients are able to view lab/test results, encounter notes, upcoming appointments, etc.  Non-urgent messages can be sent to your provider as well.   To learn more about what you can do with MyChart, go to [email or web address].            Signed, [name] Francyne, MD  02/10/2024 6:46 PM    [facility]  "

## 2024-02-22 ENCOUNTER — Encounter: Admitting: Physician Assistant

## 2024-02-22 ENCOUNTER — Encounter

## 2024-03-28 ENCOUNTER — Encounter: Payer: Self-pay | Admitting: Physician Assistant

## 2024-04-25 ENCOUNTER — Ambulatory Visit (HOSPITAL_COMMUNITY): Admitting: Internal Medicine

## 2024-05-10 ENCOUNTER — Ambulatory Visit: Admitting: Podiatry

## 2024-05-18 ENCOUNTER — Ambulatory Visit: Admitting: Cardiovascular Disease
